# Patient Record
Sex: Female | Born: 1960 | Race: Black or African American | Hispanic: No | Marital: Single | Smoking: Never smoker
Health system: Southern US, Community
[De-identification: ages and names within clinical notes are randomized; demographics above are authoritative.]

## PROBLEM LIST (undated history)

## (undated) DIAGNOSIS — M109 Gout, unspecified: Secondary | ICD-10-CM

## (undated) DIAGNOSIS — M1990 Unspecified osteoarthritis, unspecified site: Secondary | ICD-10-CM

## (undated) DIAGNOSIS — F29 Unspecified psychosis not due to a substance or known physiological condition: Secondary | ICD-10-CM

## (undated) DIAGNOSIS — J309 Allergic rhinitis, unspecified: Secondary | ICD-10-CM

## (undated) DIAGNOSIS — F603 Borderline personality disorder: Secondary | ICD-10-CM

## (undated) DIAGNOSIS — N764 Abscess of vulva: Secondary | ICD-10-CM

## (undated) DIAGNOSIS — G4730 Sleep apnea, unspecified: Secondary | ICD-10-CM

## (undated) DIAGNOSIS — F79 Unspecified intellectual disabilities: Secondary | ICD-10-CM

## (undated) DIAGNOSIS — I10 Essential (primary) hypertension: Secondary | ICD-10-CM

## (undated) DIAGNOSIS — I509 Heart failure, unspecified: Secondary | ICD-10-CM

## (undated) DIAGNOSIS — Z86010 Personal history of colonic polyps: Secondary | ICD-10-CM

## (undated) DIAGNOSIS — E669 Obesity, unspecified: Secondary | ICD-10-CM

## (undated) DIAGNOSIS — N183 Chronic kidney disease, stage 3 unspecified: Secondary | ICD-10-CM

## (undated) DIAGNOSIS — M25559 Pain in unspecified hip: Secondary | ICD-10-CM

## (undated) DIAGNOSIS — L89899 Pressure ulcer of other site, unspecified stage: Secondary | ICD-10-CM

## (undated) DIAGNOSIS — R011 Cardiac murmur, unspecified: Secondary | ICD-10-CM

## (undated) HISTORY — DX: Essential (primary) hypertension: I10

## (undated) HISTORY — DX: Unspecified osteoarthritis, unspecified site: M19.90

## (undated) HISTORY — DX: Allergic rhinitis, unspecified: J30.9

## (undated) HISTORY — DX: Borderline personality disorder: F60.3

## (undated) HISTORY — DX: Personal history of colonic polyps: Z86.010

## (undated) HISTORY — DX: Obesity, unspecified: E66.9

## (undated) HISTORY — DX: Pressure ulcer of other site, unspecified stage: L89.899

## (undated) HISTORY — DX: Pain in unspecified hip: M25.559

## (undated) HISTORY — DX: Cardiac murmur, unspecified: R01.1

## (undated) HISTORY — DX: Gout, unspecified: M10.9

## (undated) HISTORY — DX: Sleep apnea, unspecified: G47.30

## (undated) HISTORY — DX: Unspecified psychosis not due to a substance or known physiological condition: F29

## (undated) HISTORY — DX: Heart failure, unspecified: I50.9

## (undated) HISTORY — DX: Abscess of vulva: N76.4

## (undated) HISTORY — PX: TYMPANOSTOMY TUBE PLACEMENT: SHX32

---

## 1997-08-24 ENCOUNTER — Emergency Department (HOSPITAL_COMMUNITY): Admission: EM | Admit: 1997-08-24 | Discharge: 1997-08-24 | Payer: Self-pay | Admitting: Emergency Medicine

## 1997-08-26 ENCOUNTER — Encounter: Admission: RE | Admit: 1997-08-26 | Discharge: 1997-11-24 | Payer: Self-pay | Admitting: Internal Medicine

## 1997-08-31 ENCOUNTER — Emergency Department (HOSPITAL_COMMUNITY): Admission: EM | Admit: 1997-08-31 | Discharge: 1997-08-31 | Payer: Self-pay | Admitting: Emergency Medicine

## 1997-09-17 ENCOUNTER — Inpatient Hospital Stay: Admission: EM | Admit: 1997-09-17 | Discharge: 1997-09-20 | Payer: Self-pay | Admitting: Emergency Medicine

## 1997-09-20 ENCOUNTER — Inpatient Hospital Stay (HOSPITAL_COMMUNITY): Admission: RE | Admit: 1997-09-20 | Discharge: 1997-09-26 | Payer: Self-pay | Admitting: Specialist

## 1997-10-13 ENCOUNTER — Emergency Department (HOSPITAL_COMMUNITY): Admission: EM | Admit: 1997-10-13 | Discharge: 1997-10-13 | Payer: Self-pay | Admitting: Emergency Medicine

## 1997-11-18 ENCOUNTER — Emergency Department (HOSPITAL_COMMUNITY): Admission: EM | Admit: 1997-11-18 | Discharge: 1997-11-18 | Payer: Self-pay | Admitting: Emergency Medicine

## 1997-11-23 ENCOUNTER — Emergency Department (HOSPITAL_COMMUNITY): Admission: EM | Admit: 1997-11-23 | Discharge: 1997-11-23 | Payer: Self-pay | Admitting: Emergency Medicine

## 1997-12-14 ENCOUNTER — Encounter: Admission: RE | Admit: 1997-12-14 | Discharge: 1998-03-14 | Payer: Self-pay | Admitting: Internal Medicine

## 1998-03-08 ENCOUNTER — Encounter: Payer: Self-pay | Admitting: Emergency Medicine

## 1998-03-08 ENCOUNTER — Emergency Department (HOSPITAL_COMMUNITY): Admission: EM | Admit: 1998-03-08 | Discharge: 1998-03-08 | Payer: Self-pay | Admitting: Emergency Medicine

## 1998-03-09 ENCOUNTER — Ambulatory Visit (HOSPITAL_COMMUNITY): Admission: RE | Admit: 1998-03-09 | Discharge: 1998-03-09 | Payer: Self-pay | Admitting: Gastroenterology

## 1998-03-09 ENCOUNTER — Encounter: Payer: Self-pay | Admitting: Gastroenterology

## 1998-04-14 ENCOUNTER — Ambulatory Visit (HOSPITAL_COMMUNITY): Admission: RE | Admit: 1998-04-14 | Discharge: 1998-04-14 | Payer: Self-pay | Admitting: Gastroenterology

## 1998-05-10 ENCOUNTER — Emergency Department (HOSPITAL_COMMUNITY): Admission: EM | Admit: 1998-05-10 | Discharge: 1998-05-10 | Payer: Self-pay | Admitting: Emergency Medicine

## 1998-06-28 ENCOUNTER — Encounter: Admission: RE | Admit: 1998-06-28 | Discharge: 1998-09-26 | Payer: Self-pay | Admitting: Internal Medicine

## 1998-07-14 ENCOUNTER — Ambulatory Visit (HOSPITAL_COMMUNITY): Admission: RE | Admit: 1998-07-14 | Discharge: 1998-07-14 | Payer: Self-pay | Admitting: Family Medicine

## 1998-07-14 ENCOUNTER — Encounter: Payer: Self-pay | Admitting: Family Medicine

## 1998-08-29 ENCOUNTER — Emergency Department (HOSPITAL_COMMUNITY): Admission: EM | Admit: 1998-08-29 | Discharge: 1998-08-29 | Payer: Self-pay | Admitting: Emergency Medicine

## 1998-09-24 ENCOUNTER — Encounter: Payer: Self-pay | Admitting: Emergency Medicine

## 1998-09-24 ENCOUNTER — Emergency Department (HOSPITAL_COMMUNITY): Admission: EM | Admit: 1998-09-24 | Discharge: 1998-09-24 | Payer: Self-pay | Admitting: Emergency Medicine

## 1998-10-26 ENCOUNTER — Encounter: Admission: RE | Admit: 1998-10-26 | Discharge: 1998-12-13 | Payer: Self-pay | Admitting: Internal Medicine

## 1998-11-28 ENCOUNTER — Emergency Department (HOSPITAL_COMMUNITY): Admission: EM | Admit: 1998-11-28 | Discharge: 1998-11-28 | Payer: Self-pay | Admitting: Emergency Medicine

## 1998-12-14 ENCOUNTER — Encounter: Admission: RE | Admit: 1998-12-14 | Discharge: 1999-02-17 | Payer: Self-pay | Admitting: Internal Medicine

## 1999-03-16 ENCOUNTER — Encounter: Admission: RE | Admit: 1999-03-16 | Discharge: 1999-06-14 | Payer: Self-pay | Admitting: Internal Medicine

## 1999-04-23 ENCOUNTER — Emergency Department (HOSPITAL_COMMUNITY): Admission: EM | Admit: 1999-04-23 | Discharge: 1999-04-23 | Payer: Self-pay | Admitting: Emergency Medicine

## 1999-05-03 ENCOUNTER — Inpatient Hospital Stay (HOSPITAL_COMMUNITY): Admission: EM | Admit: 1999-05-03 | Discharge: 1999-05-05 | Payer: Self-pay | Admitting: *Deleted

## 1999-05-27 ENCOUNTER — Ambulatory Visit (HOSPITAL_COMMUNITY): Admission: RE | Admit: 1999-05-27 | Discharge: 1999-05-27 | Payer: Self-pay | Admitting: Family Medicine

## 1999-06-15 ENCOUNTER — Encounter: Admission: RE | Admit: 1999-06-15 | Discharge: 1999-09-13 | Payer: Self-pay | Admitting: Internal Medicine

## 1999-07-26 ENCOUNTER — Other Ambulatory Visit: Admission: RE | Admit: 1999-07-26 | Discharge: 1999-07-26 | Payer: Self-pay | Admitting: Family Medicine

## 1999-08-25 ENCOUNTER — Emergency Department (HOSPITAL_COMMUNITY): Admission: EM | Admit: 1999-08-25 | Discharge: 1999-08-25 | Payer: Self-pay | Admitting: Emergency Medicine

## 1999-09-20 ENCOUNTER — Encounter: Admission: RE | Admit: 1999-09-20 | Discharge: 1999-12-19 | Payer: Self-pay | Admitting: Internal Medicine

## 1999-10-09 ENCOUNTER — Encounter: Payer: Self-pay | Admitting: Emergency Medicine

## 1999-10-09 ENCOUNTER — Emergency Department (HOSPITAL_COMMUNITY): Admission: EM | Admit: 1999-10-09 | Discharge: 1999-10-10 | Payer: Self-pay | Admitting: Emergency Medicine

## 1999-12-25 ENCOUNTER — Emergency Department (HOSPITAL_COMMUNITY): Admission: EM | Admit: 1999-12-25 | Discharge: 1999-12-25 | Payer: Self-pay | Admitting: Emergency Medicine

## 1999-12-25 ENCOUNTER — Encounter: Payer: Self-pay | Admitting: Emergency Medicine

## 2000-02-19 ENCOUNTER — Observation Stay (HOSPITAL_COMMUNITY): Admission: EM | Admit: 2000-02-19 | Discharge: 2000-02-20 | Payer: Self-pay | Admitting: Emergency Medicine

## 2000-04-24 ENCOUNTER — Encounter: Admission: RE | Admit: 2000-04-24 | Discharge: 2000-04-24 | Payer: Self-pay | Admitting: Family Medicine

## 2000-05-22 ENCOUNTER — Emergency Department (HOSPITAL_COMMUNITY): Admission: EM | Admit: 2000-05-22 | Discharge: 2000-05-22 | Payer: Self-pay | Admitting: Emergency Medicine

## 2000-05-22 ENCOUNTER — Encounter: Payer: Self-pay | Admitting: Emergency Medicine

## 2000-05-24 ENCOUNTER — Encounter: Admission: RE | Admit: 2000-05-24 | Discharge: 2000-05-24 | Payer: Self-pay | Admitting: Sports Medicine

## 2000-05-24 ENCOUNTER — Encounter: Admission: RE | Admit: 2000-05-24 | Discharge: 2000-05-24 | Payer: Self-pay | Admitting: Family Medicine

## 2000-05-24 ENCOUNTER — Encounter: Payer: Self-pay | Admitting: Sports Medicine

## 2000-06-01 ENCOUNTER — Encounter: Admission: RE | Admit: 2000-06-01 | Discharge: 2000-06-01 | Payer: Self-pay | Admitting: *Deleted

## 2000-06-01 ENCOUNTER — Encounter: Admission: RE | Admit: 2000-06-01 | Discharge: 2000-06-01 | Payer: Self-pay | Admitting: Family Medicine

## 2000-06-12 ENCOUNTER — Encounter: Admission: RE | Admit: 2000-06-12 | Discharge: 2000-09-10 | Payer: Self-pay | Admitting: *Deleted

## 2000-06-14 ENCOUNTER — Encounter: Admission: RE | Admit: 2000-06-14 | Discharge: 2000-06-14 | Payer: Self-pay | Admitting: Family Medicine

## 2000-06-14 ENCOUNTER — Encounter: Admission: RE | Admit: 2000-06-14 | Discharge: 2000-06-14 | Payer: Self-pay | Admitting: *Deleted

## 2000-06-18 ENCOUNTER — Inpatient Hospital Stay (HOSPITAL_COMMUNITY): Admission: EM | Admit: 2000-06-18 | Discharge: 2000-06-26 | Payer: Self-pay | Admitting: *Deleted

## 2000-08-03 ENCOUNTER — Encounter: Admission: RE | Admit: 2000-08-03 | Discharge: 2000-08-03 | Payer: Self-pay | Admitting: Family Medicine

## 2000-08-27 ENCOUNTER — Encounter: Admission: RE | Admit: 2000-08-27 | Discharge: 2000-08-27 | Payer: Self-pay | Admitting: Family Medicine

## 2000-09-04 ENCOUNTER — Encounter: Admission: RE | Admit: 2000-09-04 | Discharge: 2000-09-04 | Payer: Self-pay | Admitting: Obstetrics & Gynecology

## 2000-09-05 ENCOUNTER — Inpatient Hospital Stay (HOSPITAL_COMMUNITY): Admission: EM | Admit: 2000-09-05 | Discharge: 2000-09-06 | Payer: Self-pay | Admitting: Emergency Medicine

## 2000-09-11 ENCOUNTER — Encounter: Admission: RE | Admit: 2000-09-11 | Discharge: 2000-09-11 | Payer: Self-pay | Admitting: Obstetrics & Gynecology

## 2000-09-11 ENCOUNTER — Other Ambulatory Visit: Admission: RE | Admit: 2000-09-11 | Discharge: 2000-09-11 | Payer: Self-pay | Admitting: Obstetrics & Gynecology

## 2000-09-12 ENCOUNTER — Encounter: Admission: RE | Admit: 2000-09-12 | Discharge: 2000-09-12 | Payer: Self-pay | Admitting: Family Medicine

## 2000-09-18 ENCOUNTER — Encounter: Admission: RE | Admit: 2000-09-18 | Discharge: 2000-09-18 | Payer: Self-pay | Admitting: Family Medicine

## 2000-09-21 ENCOUNTER — Encounter: Admission: RE | Admit: 2000-09-21 | Discharge: 2000-09-21 | Payer: Self-pay | Admitting: Family Medicine

## 2000-10-08 ENCOUNTER — Encounter: Admission: RE | Admit: 2000-10-08 | Discharge: 2000-10-08 | Payer: Self-pay | Admitting: Family Medicine

## 2000-11-28 ENCOUNTER — Ambulatory Visit (HOSPITAL_COMMUNITY): Admission: RE | Admit: 2000-11-28 | Discharge: 2000-11-28 | Payer: Self-pay | Admitting: Obstetrics & Gynecology

## 2000-12-21 ENCOUNTER — Emergency Department (HOSPITAL_COMMUNITY): Admission: EM | Admit: 2000-12-21 | Discharge: 2000-12-22 | Payer: Self-pay | Admitting: *Deleted

## 2000-12-26 ENCOUNTER — Encounter: Admission: RE | Admit: 2000-12-26 | Discharge: 2001-03-26 | Payer: Self-pay | Admitting: Sports Medicine

## 2001-02-12 ENCOUNTER — Encounter: Admission: RE | Admit: 2001-02-12 | Discharge: 2001-02-12 | Payer: Self-pay | Admitting: Family Medicine

## 2001-04-01 ENCOUNTER — Encounter: Admission: RE | Admit: 2001-04-01 | Discharge: 2001-04-01 | Payer: Self-pay | Admitting: Family Medicine

## 2001-05-02 ENCOUNTER — Encounter: Admission: RE | Admit: 2001-05-02 | Discharge: 2001-07-31 | Payer: Self-pay | Admitting: Sports Medicine

## 2001-05-23 ENCOUNTER — Encounter: Admission: RE | Admit: 2001-05-23 | Discharge: 2001-05-23 | Payer: Self-pay | Admitting: Family Medicine

## 2001-06-03 ENCOUNTER — Emergency Department (HOSPITAL_COMMUNITY): Admission: EM | Admit: 2001-06-03 | Discharge: 2001-06-03 | Payer: Self-pay | Admitting: *Deleted

## 2001-06-25 ENCOUNTER — Encounter: Admission: RE | Admit: 2001-06-25 | Discharge: 2001-06-25 | Payer: Self-pay | Admitting: Family Medicine

## 2001-07-04 ENCOUNTER — Encounter: Payer: Self-pay | Admitting: *Deleted

## 2001-07-04 ENCOUNTER — Encounter: Admission: RE | Admit: 2001-07-04 | Discharge: 2001-07-04 | Payer: Self-pay | Admitting: *Deleted

## 2001-09-30 ENCOUNTER — Inpatient Hospital Stay (HOSPITAL_COMMUNITY): Admission: EM | Admit: 2001-09-30 | Discharge: 2001-10-02 | Payer: Self-pay | Admitting: Emergency Medicine

## 2001-10-02 ENCOUNTER — Inpatient Hospital Stay (HOSPITAL_COMMUNITY): Admission: EM | Admit: 2001-10-02 | Discharge: 2001-10-08 | Payer: Self-pay | Admitting: Psychiatry

## 2001-10-22 ENCOUNTER — Encounter: Admission: RE | Admit: 2001-10-22 | Discharge: 2001-10-22 | Payer: Self-pay | Admitting: Family Medicine

## 2001-10-31 ENCOUNTER — Encounter: Admission: RE | Admit: 2001-10-31 | Discharge: 2001-10-31 | Payer: Self-pay | Admitting: Family Medicine

## 2001-11-06 ENCOUNTER — Encounter: Admission: RE | Admit: 2001-11-06 | Discharge: 2001-11-06 | Payer: Self-pay | Admitting: Family Medicine

## 2001-11-12 ENCOUNTER — Encounter: Admission: RE | Admit: 2001-11-12 | Discharge: 2001-11-12 | Payer: Self-pay | Admitting: Family Medicine

## 2001-11-21 ENCOUNTER — Encounter: Admission: RE | Admit: 2001-11-21 | Discharge: 2001-11-21 | Payer: Self-pay | Admitting: Family Medicine

## 2001-12-30 ENCOUNTER — Encounter: Admission: RE | Admit: 2001-12-30 | Discharge: 2001-12-30 | Payer: Self-pay | Admitting: Family Medicine

## 2002-01-17 ENCOUNTER — Encounter: Admission: RE | Admit: 2002-01-17 | Discharge: 2002-01-17 | Payer: Self-pay | Admitting: Family Medicine

## 2002-01-24 ENCOUNTER — Encounter: Payer: Self-pay | Admitting: Emergency Medicine

## 2002-01-24 ENCOUNTER — Emergency Department (HOSPITAL_COMMUNITY): Admission: EM | Admit: 2002-01-24 | Discharge: 2002-01-24 | Payer: Self-pay | Admitting: Emergency Medicine

## 2002-02-07 ENCOUNTER — Encounter: Admission: RE | Admit: 2002-02-07 | Discharge: 2002-02-07 | Payer: Self-pay | Admitting: Family Medicine

## 2002-02-12 ENCOUNTER — Encounter: Admission: RE | Admit: 2002-02-12 | Discharge: 2002-02-12 | Payer: Self-pay | Admitting: Family Medicine

## 2002-02-26 ENCOUNTER — Encounter: Admission: RE | Admit: 2002-02-26 | Discharge: 2002-02-26 | Payer: Self-pay | Admitting: Family Medicine

## 2002-03-21 ENCOUNTER — Emergency Department (HOSPITAL_COMMUNITY): Admission: EM | Admit: 2002-03-21 | Discharge: 2002-03-21 | Payer: Self-pay | Admitting: Emergency Medicine

## 2002-03-21 ENCOUNTER — Encounter: Payer: Self-pay | Admitting: Emergency Medicine

## 2002-06-28 ENCOUNTER — Emergency Department (HOSPITAL_COMMUNITY): Admission: EM | Admit: 2002-06-28 | Discharge: 2002-06-28 | Payer: Self-pay

## 2002-08-24 ENCOUNTER — Emergency Department (HOSPITAL_COMMUNITY): Admission: EM | Admit: 2002-08-24 | Discharge: 2002-08-24 | Payer: Self-pay | Admitting: *Deleted

## 2002-09-01 ENCOUNTER — Encounter: Admission: RE | Admit: 2002-09-01 | Discharge: 2002-09-01 | Payer: Self-pay | Admitting: Family Medicine

## 2003-03-03 ENCOUNTER — Encounter: Admission: RE | Admit: 2003-03-03 | Discharge: 2003-03-03 | Payer: Self-pay | Admitting: Sports Medicine

## 2003-03-18 ENCOUNTER — Encounter: Admission: RE | Admit: 2003-03-18 | Discharge: 2003-03-18 | Payer: Self-pay | Admitting: Family Medicine

## 2003-03-20 ENCOUNTER — Encounter: Admission: RE | Admit: 2003-03-20 | Discharge: 2003-03-20 | Payer: Self-pay | Admitting: Sports Medicine

## 2003-04-15 ENCOUNTER — Encounter (INDEPENDENT_AMBULATORY_CARE_PROVIDER_SITE_OTHER): Payer: Self-pay | Admitting: *Deleted

## 2003-04-15 LAB — CONVERTED CEMR LAB

## 2003-04-20 ENCOUNTER — Encounter: Admission: RE | Admit: 2003-04-20 | Discharge: 2003-04-20 | Payer: Self-pay | Admitting: Family Medicine

## 2003-04-20 ENCOUNTER — Encounter: Admission: RE | Admit: 2003-04-20 | Discharge: 2003-04-20 | Payer: Self-pay | Admitting: Sports Medicine

## 2003-04-27 ENCOUNTER — Other Ambulatory Visit: Admission: RE | Admit: 2003-04-27 | Discharge: 2003-04-27 | Payer: Self-pay | Admitting: Family Medicine

## 2003-04-27 ENCOUNTER — Encounter: Admission: RE | Admit: 2003-04-27 | Discharge: 2003-04-27 | Payer: Self-pay | Admitting: Family Medicine

## 2003-05-05 ENCOUNTER — Encounter: Admission: RE | Admit: 2003-05-05 | Discharge: 2003-05-05 | Payer: Self-pay | Admitting: Family Medicine

## 2003-05-29 ENCOUNTER — Encounter: Admission: RE | Admit: 2003-05-29 | Discharge: 2003-05-29 | Payer: Self-pay | Admitting: Family Medicine

## 2003-06-10 ENCOUNTER — Encounter: Admission: RE | Admit: 2003-06-10 | Discharge: 2003-06-10 | Payer: Self-pay | Admitting: Family Medicine

## 2003-07-07 ENCOUNTER — Encounter: Admission: RE | Admit: 2003-07-07 | Discharge: 2003-07-07 | Payer: Self-pay | Admitting: Family Medicine

## 2003-08-17 ENCOUNTER — Inpatient Hospital Stay (HOSPITAL_COMMUNITY): Admission: AD | Admit: 2003-08-17 | Discharge: 2003-08-19 | Payer: Self-pay | Admitting: Pediatrics

## 2003-08-17 ENCOUNTER — Emergency Department (HOSPITAL_COMMUNITY): Admission: EM | Admit: 2003-08-17 | Discharge: 2003-08-17 | Payer: Self-pay | Admitting: Emergency Medicine

## 2003-08-25 ENCOUNTER — Encounter: Admission: RE | Admit: 2003-08-25 | Discharge: 2003-08-25 | Payer: Self-pay | Admitting: Sports Medicine

## 2003-10-05 ENCOUNTER — Emergency Department (HOSPITAL_COMMUNITY): Admission: EM | Admit: 2003-10-05 | Discharge: 2003-10-06 | Payer: Self-pay | Admitting: Emergency Medicine

## 2003-10-06 ENCOUNTER — Emergency Department (HOSPITAL_COMMUNITY): Admission: EM | Admit: 2003-10-06 | Discharge: 2003-10-06 | Payer: Self-pay

## 2003-12-01 ENCOUNTER — Encounter: Admission: RE | Admit: 2003-12-01 | Discharge: 2003-12-01 | Payer: Self-pay | Admitting: Obstetrics & Gynecology

## 2004-06-15 ENCOUNTER — Encounter: Admission: RE | Admit: 2004-06-15 | Discharge: 2004-06-15 | Payer: Self-pay | Admitting: Family Medicine

## 2004-07-18 ENCOUNTER — Encounter: Admission: RE | Admit: 2004-07-18 | Discharge: 2004-10-16 | Payer: Self-pay | Admitting: Family Medicine

## 2004-07-25 ENCOUNTER — Emergency Department (HOSPITAL_COMMUNITY): Admission: EM | Admit: 2004-07-25 | Discharge: 2004-07-25 | Payer: Self-pay | Admitting: Emergency Medicine

## 2004-08-31 ENCOUNTER — Emergency Department (HOSPITAL_COMMUNITY): Admission: EM | Admit: 2004-08-31 | Discharge: 2004-09-01 | Payer: Self-pay | Admitting: Emergency Medicine

## 2004-10-18 ENCOUNTER — Emergency Department (HOSPITAL_COMMUNITY): Admission: EM | Admit: 2004-10-18 | Discharge: 2004-10-18 | Payer: Self-pay | Admitting: Emergency Medicine

## 2005-09-07 ENCOUNTER — Encounter: Admission: RE | Admit: 2005-09-07 | Discharge: 2005-09-07 | Payer: Self-pay | Admitting: Family Medicine

## 2005-12-03 ENCOUNTER — Emergency Department (HOSPITAL_COMMUNITY): Admission: EM | Admit: 2005-12-03 | Discharge: 2005-12-03 | Payer: Self-pay | Admitting: Emergency Medicine

## 2006-01-16 ENCOUNTER — Other Ambulatory Visit: Admission: RE | Admit: 2006-01-16 | Discharge: 2006-01-16 | Payer: Self-pay | Admitting: Family Medicine

## 2006-07-13 ENCOUNTER — Encounter (INDEPENDENT_AMBULATORY_CARE_PROVIDER_SITE_OTHER): Payer: Self-pay | Admitting: *Deleted

## 2006-10-12 ENCOUNTER — Encounter: Admission: RE | Admit: 2006-10-12 | Discharge: 2006-10-12 | Payer: Self-pay | Admitting: Family Medicine

## 2006-11-08 ENCOUNTER — Ambulatory Visit: Payer: Self-pay | Admitting: Pulmonary Disease

## 2006-11-08 LAB — CONVERTED CEMR LAB: TSH: 2.49 microintl units/mL (ref 0.35–5.50)

## 2006-12-16 ENCOUNTER — Ambulatory Visit (HOSPITAL_BASED_OUTPATIENT_CLINIC_OR_DEPARTMENT_OTHER): Admission: RE | Admit: 2006-12-16 | Discharge: 2006-12-16 | Payer: Self-pay | Admitting: Pulmonary Disease

## 2007-01-07 ENCOUNTER — Ambulatory Visit: Payer: Self-pay | Admitting: Pulmonary Disease

## 2007-01-16 ENCOUNTER — Ambulatory Visit: Payer: Self-pay | Admitting: Pulmonary Disease

## 2007-02-14 ENCOUNTER — Ambulatory Visit: Payer: Self-pay | Admitting: Pulmonary Disease

## 2007-03-25 ENCOUNTER — Encounter: Admission: RE | Admit: 2007-03-25 | Discharge: 2007-03-25 | Payer: Self-pay | Admitting: Family Medicine

## 2007-05-20 ENCOUNTER — Encounter: Admission: RE | Admit: 2007-05-20 | Discharge: 2007-05-20 | Payer: Self-pay | Admitting: Family Medicine

## 2007-11-29 ENCOUNTER — Encounter: Admission: RE | Admit: 2007-11-29 | Discharge: 2007-11-29 | Payer: Self-pay | Admitting: Family Medicine

## 2008-06-10 ENCOUNTER — Encounter: Admission: RE | Admit: 2008-06-10 | Discharge: 2008-06-10 | Payer: Self-pay | Admitting: Family Medicine

## 2008-07-10 ENCOUNTER — Encounter: Admission: RE | Admit: 2008-07-10 | Discharge: 2008-07-10 | Payer: Self-pay | Admitting: Orthopedic Surgery

## 2008-07-16 ENCOUNTER — Encounter: Admission: RE | Admit: 2008-07-16 | Discharge: 2008-07-16 | Payer: Self-pay | Admitting: Orthopedic Surgery

## 2008-12-09 ENCOUNTER — Encounter: Admission: RE | Admit: 2008-12-09 | Discharge: 2008-12-09 | Payer: Self-pay | Admitting: Family Medicine

## 2009-02-03 ENCOUNTER — Other Ambulatory Visit: Admission: RE | Admit: 2009-02-03 | Discharge: 2009-02-03 | Payer: Self-pay | Admitting: Family Medicine

## 2009-05-15 HISTORY — PX: OTHER SURGICAL HISTORY: SHX169

## 2009-08-02 ENCOUNTER — Ambulatory Visit: Payer: Self-pay | Admitting: Family Medicine

## 2009-08-02 ENCOUNTER — Encounter: Payer: Self-pay | Admitting: Family Medicine

## 2009-08-02 DIAGNOSIS — G479 Sleep disorder, unspecified: Secondary | ICD-10-CM | POA: Insufficient documentation

## 2009-08-02 DIAGNOSIS — M159 Polyosteoarthritis, unspecified: Secondary | ICD-10-CM | POA: Insufficient documentation

## 2009-08-02 DIAGNOSIS — J309 Allergic rhinitis, unspecified: Secondary | ICD-10-CM

## 2009-08-02 DIAGNOSIS — IMO0002 Reserved for concepts with insufficient information to code with codable children: Secondary | ICD-10-CM | POA: Insufficient documentation

## 2009-08-02 DIAGNOSIS — I10 Essential (primary) hypertension: Secondary | ICD-10-CM | POA: Insufficient documentation

## 2009-08-02 DIAGNOSIS — F79 Unspecified intellectual disabilities: Secondary | ICD-10-CM

## 2009-08-02 DIAGNOSIS — K219 Gastro-esophageal reflux disease without esophagitis: Secondary | ICD-10-CM | POA: Insufficient documentation

## 2009-08-02 DIAGNOSIS — Z6841 Body Mass Index (BMI) 40.0 and over, adult: Secondary | ICD-10-CM | POA: Insufficient documentation

## 2009-08-02 HISTORY — DX: Allergic rhinitis, unspecified: J30.9

## 2009-08-16 ENCOUNTER — Ambulatory Visit (HOSPITAL_BASED_OUTPATIENT_CLINIC_OR_DEPARTMENT_OTHER): Admission: RE | Admit: 2009-08-16 | Discharge: 2009-08-16 | Payer: Self-pay | Admitting: Specialist

## 2009-09-01 ENCOUNTER — Encounter: Payer: Self-pay | Admitting: Family Medicine

## 2009-09-01 DIAGNOSIS — R252 Cramp and spasm: Secondary | ICD-10-CM | POA: Insufficient documentation

## 2009-09-03 ENCOUNTER — Encounter: Payer: Self-pay | Admitting: Family Medicine

## 2009-09-03 LAB — CONVERTED CEMR LAB
ALT: <8 units/L (ref 0–35)
AST: 15 units/L (ref 0–37)
Albumin: 4 g/dL (ref 3.5–5.2)
Alkaline Phosphatase: 144 units/L — ABNORMAL HIGH (ref 39–117)
BUN: 19 mg/dL (ref 6–23)
Basophils Absolute: 0 10*3/uL (ref 0.0–0.1)
Basophils Relative: 0 % (ref 0–1)
CO2: 24 meq/L (ref 19–32)
Calcium: 9.3 mg/dL (ref 8.4–10.5)
Chloride: 103 meq/L (ref 96–112)
Cholesterol: 116 mg/dL (ref 0–200)
Creatinine, Ser: 1.16 mg/dL (ref 0.40–1.20)
Eosinophils Absolute: 0.3 10*3/uL (ref 0.0–0.7)
Eosinophils Relative: 4 % (ref 0–5)
Glucose, Bld: 79 mg/dL (ref 70–99)
HCT: 35.8 % — ABNORMAL LOW (ref 36.0–46.0)
HDL: 48 mg/dL (ref >39)
Hemoglobin: 11 g/dL — ABNORMAL LOW (ref 12.0–15.0)
LDL Cholesterol: 56 mg/dL (ref 0–99)
Lymphocytes Relative: 34 % (ref 12–46)
Lymphs Abs: 2.8 10*3/uL (ref 0.7–4.0)
MCHC: 30.7 g/dL (ref 30.0–36.0)
MCV: 81.4 fL (ref 78.0–100.0)
Monocytes Absolute: 0.6 10*3/uL (ref 0.1–1.0)
Monocytes Relative: 7 % (ref 3–12)
Neutro Abs: 4.5 10*3/uL (ref 1.7–7.7)
Neutrophils Relative %: 54 % (ref 43–77)
Platelets: 352 10*3/uL (ref 150–400)
Potassium: 4.1 meq/L (ref 3.5–5.3)
RBC: 4.4 M/uL (ref 3.87–5.11)
RDW: 15.3 % (ref 11.5–15.5)
Sodium: 139 meq/L (ref 135–145)
TSH: 2.646 microintl units/mL (ref 0.350–4.500)
Total Bilirubin: 0.5 mg/dL (ref 0.3–1.2)
Total CHOL/HDL Ratio: 2.4
Total Protein: 7.3 g/dL (ref 6.0–8.3)
Triglycerides: 58 mg/dL (ref <150)
VLDL: 12 mg/dL (ref 0–40)
WBC: 8.4 10*3/uL (ref 4.0–10.5)

## 2009-09-23 ENCOUNTER — Encounter: Payer: Self-pay | Admitting: Family Medicine

## 2009-10-01 ENCOUNTER — Ambulatory Visit: Payer: Self-pay | Admitting: Family Medicine

## 2009-10-01 DIAGNOSIS — L89899 Pressure ulcer of other site, unspecified stage: Secondary | ICD-10-CM | POA: Insufficient documentation

## 2009-10-01 HISTORY — DX: Pressure ulcer of other site, unspecified stage: L89.899

## 2009-10-07 ENCOUNTER — Encounter: Admission: RE | Admit: 2009-10-07 | Discharge: 2009-10-07 | Payer: Self-pay | Admitting: Orthopedic Surgery

## 2009-10-08 ENCOUNTER — Ambulatory Visit: Payer: Self-pay | Admitting: Family Medicine

## 2009-10-12 ENCOUNTER — Ambulatory Visit: Payer: Self-pay | Admitting: Family Medicine

## 2009-10-15 ENCOUNTER — Encounter: Payer: Self-pay | Admitting: Family Medicine

## 2009-10-15 LAB — CONVERTED CEMR LAB
Pap Smear: NEGATIVE
Pap Smear: NORMAL

## 2009-10-19 ENCOUNTER — Ambulatory Visit: Payer: Self-pay | Admitting: Family Medicine

## 2009-10-22 ENCOUNTER — Ambulatory Visit: Payer: Self-pay | Admitting: Family Medicine

## 2009-10-29 ENCOUNTER — Ambulatory Visit: Payer: Self-pay | Admitting: Family Medicine

## 2009-11-05 ENCOUNTER — Ambulatory Visit: Payer: Self-pay | Admitting: Family Medicine

## 2009-11-12 ENCOUNTER — Ambulatory Visit: Payer: Self-pay | Admitting: Family Medicine

## 2009-11-18 ENCOUNTER — Ambulatory Visit: Payer: Self-pay | Admitting: Family Medicine

## 2009-11-29 ENCOUNTER — Ambulatory Visit: Payer: Self-pay | Admitting: Family Medicine

## 2009-12-06 ENCOUNTER — Ambulatory Visit: Payer: Self-pay | Admitting: Family Medicine

## 2009-12-13 ENCOUNTER — Ambulatory Visit: Payer: Self-pay | Admitting: Family Medicine

## 2009-12-20 ENCOUNTER — Ambulatory Visit: Payer: Self-pay | Admitting: Family Medicine

## 2009-12-20 DIAGNOSIS — F29 Unspecified psychosis not due to a substance or known physiological condition: Secondary | ICD-10-CM | POA: Insufficient documentation

## 2009-12-20 HISTORY — DX: Unspecified psychosis not due to a substance or known physiological condition: F29

## 2009-12-23 ENCOUNTER — Ambulatory Visit: Payer: Self-pay | Admitting: Diagnostic Radiology

## 2009-12-23 ENCOUNTER — Emergency Department (HOSPITAL_BASED_OUTPATIENT_CLINIC_OR_DEPARTMENT_OTHER): Admission: EM | Admit: 2009-12-23 | Discharge: 2009-12-23 | Payer: Self-pay | Admitting: Emergency Medicine

## 2009-12-27 ENCOUNTER — Telehealth: Payer: Self-pay | Admitting: Family Medicine

## 2010-01-14 ENCOUNTER — Ambulatory Visit: Payer: Self-pay | Admitting: Family Medicine

## 2010-01-18 ENCOUNTER — Encounter (HOSPITAL_BASED_OUTPATIENT_CLINIC_OR_DEPARTMENT_OTHER)
Admission: RE | Admit: 2010-01-18 | Discharge: 2010-04-18 | Payer: Self-pay | Source: Home / Self Care | Admitting: General Surgery

## 2010-01-24 ENCOUNTER — Encounter: Payer: Self-pay | Admitting: Family Medicine

## 2010-01-25 ENCOUNTER — Telehealth: Payer: Self-pay | Admitting: Family Medicine

## 2010-01-26 ENCOUNTER — Emergency Department (HOSPITAL_COMMUNITY): Admission: EM | Admit: 2010-01-26 | Discharge: 2010-01-26 | Payer: Self-pay | Admitting: Emergency Medicine

## 2010-01-26 ENCOUNTER — Encounter: Payer: Self-pay | Admitting: *Deleted

## 2010-01-27 ENCOUNTER — Ambulatory Visit: Payer: Self-pay | Admitting: Family Medicine

## 2010-02-16 ENCOUNTER — Encounter: Payer: Self-pay | Admitting: Family Medicine

## 2010-02-17 ENCOUNTER — Ambulatory Visit: Payer: Self-pay | Admitting: Family Medicine

## 2010-04-19 ENCOUNTER — Encounter: Admission: RE | Admit: 2010-04-19 | Discharge: 2010-04-19 | Payer: Self-pay | Admitting: Family Medicine

## 2010-05-02 ENCOUNTER — Ambulatory Visit: Payer: Self-pay

## 2010-06-14 NOTE — Assessment & Plan Note (Signed)
Summary: DRESSING CHANGE/KH  Nurse Visit old Restore dressing removed and wound cleaned with normal saline.  area is now 1.5 X 2 cm. new restore dressing applied . return in one week.  will need follow up with MD one month from 0/11/2009. Theresia Lo RN  November 29, 2009 10:59 AM   Allergies: 1)  ! Vasotec  Orders Added: 1)  Est Level 1- [facility] [[id number]]

## 2010-06-14 NOTE — Assessment & Plan Note (Signed)
Summary: meet new doctor and follow up wound care/ls   Vital Signs:  Patient profile:   50 year old female Height:      64.5 inches Weight:      302.44 pounds BMI:     51.30 BSA:     2.35 Temp:     98.8 degrees F Pulse rate:   92 / minute BP sitting:   126 / 80  Vitals Entered By: Jone Baseman CMA (February 17, 2010 2:50 PM) CC: Meet new MD Is Patient Diabetic? No Pain Assessment Patient in pain? no        Primary Care Provider:  Alvia Grove DO  CC:  Meet new MD.  History of Present Illness: 50 yo female, here for follow up visit of wound and to meet new MD (1st visit with me). 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 Had been followed by wound care but ulcer had shown so much improvement that they were no longer needed.   Pt denies fever, chills, nausea, emesis, diarrhea, constipation, drainage from wound.  Wound is checked daily by staff at adult care facility, has not required a dressing or medicine in > 3 weeks.   HTN: No chest pain, no SOB, no dizziness.  taking meds as prescribed.  Habits & Providers  Alcohol-Tobacco-Diet     Alcohol drinks/day: 0     Tobacco Status: never   Prevention & Chronic Care Immunizations   Influenza vaccine: Fluvax 3+  (02/17/2010)    Tetanus booster: 11/12/2001: Done.    Pneumococcal vaccine: Not documented  Other Screening   Pap smear: Normal  (10/15/2009)   Pap smear due: 10/2012    Mammogram: Assessment: BIRADS 1. (approximate date was july 2010)  (11/12/2008)   Mammogram action/deferral: Deferred-2 yr interval  (02/17/2010)   Smoking status: never  (02/17/2010)  Lipids   Total Cholesterol: 116  (09/01/2009)   LDL: 56  (09/01/2009)   LDL Direct: Not documented   HDL: 48  (09/01/2009)   Triglycerides: 58  (09/01/2009)   Lipid panel due: 09/02/2010  Hypertension   Last Blood Pressure: 126 / 80   (02/17/2010)   Serum creatinine: 1.16  (09/01/2009)   Serum potassium 4.1  (09/01/2009)    Hypertension flowsheet reviewed?: Yes   Progress toward BP goal: At goal  Self-Management Support :   Personal Goals (by the next clinic visit) :      Personal blood pressure goal: 140/90  (08/02/2009)   Hypertension self-management support: Not documented    Hypertension self-management support not done because: Good outcomes  (09/01/2009)  Current Problems (verified): 1)  Unspecified Psychosis  (ICD-298.9) 2)  Screening For Malignant Neoplasm of The Cervix  (ICD-V76.2) 3)  Pressure Ulcer Other Site  (ICD-707.09) 4)  Leg Cramps  (ICD-729.82) 5)  Preventive Health Care  (ICD-V70.0) 6)  Osteoarthritis, Multiple Joints  (ICD-715.89) 7)  Allergic Rhinitis  (ICD-477.9) 8)  Essential Hypertension, Benign  (ICD-401.1) 9)  Sleep Disorder  (ICD-780.50) 10)  Behavior Problem  (ICD-V40.9) 11)  Gerd  (ICD-530.81) 12)  Morbid Obesity  (ICD-278.01) 13)  Mental Retardation  (ICD-319)  Current Medications (verified): 1)  Poly Tan Dm Suspension .Marland Kitchen.. 1 Teaspoon Q12hr As Needed Cough 2)  Triamterene-Hctz 37.5-25 Mg Tabs (Triamterene-Hctz) .... Daily 3)  Aspir-Low 81 Mg Tbec (Aspirin) .... Daily 4)  Fluoxetine Hcl 40 Mg Caps (Fluoxetine Hcl) .... Daily Per [facility] 5)  Colace 100 Mg Caps (Docusate Sodium) .... 2 Tabs Two Times A Day 6)  Wellbutrin Sr 150 Mg Xr12h-Tab (Bupropion Hcl) .... Two Times A Day Per [facility] 7)  Omeprazole 20 Mg Cpdr (Omeprazole) .... Daily For Reflux 8)  Benztropine Mesylate 1 Mg Tabs (Benztropine Mesylate) .... Qhs Per [facility] 9)  Lorazepam 0.5 Mg Tabs (Lorazepam) .... Qid Prn Agitation Per [facility] 10)  Mirtazapine 15 Mg Tabs (Mirtazapine) .... By Mouth At Bedtime 11)  Risperidone 2 Mg Tabs (Risperidone) .... Qhs Per [facility] 12)  Zonisamide 100 Mg Caps (Zonisamide) .... Qhs Per [facility] 13)  Etodolac 400 Mg Tabs (Etodolac) .Marland Kitchen.. 1 Po Bid  Per Dr Montez Morita (Ortho) 14)  Hydrocodone-Acetaminophen 10-650 Mg Tabs (Hydrocodone-Acetaminophen) .... Q8hr Per Dr Montez Morita (Ortho) 15)  Methocarbamol 500 Mg Tabs (Methocarbamol) .... Bid Per Dr Montez Morita (Ortho) 16)  Fluticasone Propionate 0.05 % Crea (Fluticasone Propionate) .... Two Times A Day Prn 17)  Oxycodone-Acetaminophen 5-500 Mg Caps (Oxycodone-Acetaminophen) .... Q3hr As Needed Per Dr Montez Morita 18)  Restore Wound Care Dressing  Pads (Wound Dressings) .... Apply To Wound Once A Week. 19)  Saphris 10 Mg Subl (Asenapine Maleate) .... Per [facility] 20)  Nystop 100000 Unit/gm Powd (Nystatin) .... Apply To Itchy Areas On Lower Abdomen Two Times A Day As Needed. Disp 1 Large Bottle  Allergies (verified): 1)  ! Vasotec  Past History:  Past Medical History: Last updated: 08/02/2009 h/o TB treated with [facility] attempt 4/02--tylenol OD - seen at [facility] OSTEOARTHRITIS, MULTIPLE JOINTS (ICD-715.89) - see by orthopedist Dr Montez Morita ALLERGIC RHINITIS (ICD-477.9) Hx of CHF (ICD-428.0) ESSENTIAL HYPERTENSION, BENIGN (ICD-401.1) SLEEP DISORDER (ICD-780.50) BEHAVIOR PROBLEM (ICD-V40.9) GERD (ICD-530.81) MORBID OBESITY (ICD-278.01) MENTAL RETARDATION (ICD-319)  Past Surgical History: Last updated: 09/01/2009 BTL - 09/01/2002, pe tubes - cyst/lipoma removal left scalp 07/2009  Family History: Last updated: 08/02/2009 DM--mom, sister  htn--mom, bro, sister stroke - sister  Social History: Last updated: 02/17/2010 lives with sister Jonise Weightman [facility]) and brother. Has some serivces through [employer].  no tob/etoh/drugs  Risk Factors: Alcohol Use: 0 (02/17/2010)  Risk Factors: Smoking Status: never (02/17/2010)  Family History: Reviewed history from 08/02/2009 and no changes required. DM--mom, sister  htn--mom, bro, sister stroke - sister  Social History: Reviewed history from 08/02/2009 and no changes required. lives with sister Jazzy Parmer  [facility]) and brother. Has some serivces through [employer].  no tob/etoh/drugs  Review of Systems  The patient denies anorexia, fever, weight loss, weight gain, vision loss, decreased hearing, hoarseness, chest pain, syncope, dyspnea on exertion, peripheral edema, prolonged cough, headaches, hemoptysis, abdominal pain, melena, hematochezia, severe indigestion/heartburn, hematuria, incontinence, genital sores, muscle weakness, suspicious skin lesions, transient blindness, difficulty walking, depression, unusual weight change, abnormal bleeding, enlarged lymph nodes, angioedema, breast masses, and testicular masses.    Physical Exam  General:  VS reviewed, alert, well-developed, and well-nourished.   Lungs:  Normal respiratory effort, chest expands symmetrically. Lungs are clear to auscultation, no crackles or wheezes. Heart:  Normal rate and regular rhythm. S1 and S2 normal without gallop, murmur, click, rub or other extra sounds. Abdomen:  left lower quadrant on pannus is approximately 1 cm x 1.5 cm in diameter, circular and pink healing wound with hyperpigmented border. No granulation tissue, purulence, or tenderness.  Neurologic:  cranial nerves II-XII intact.   Skin:  turgor normal and color normal.   Psych:  good eye contact, not anxious appearing, and not depressed appearing.  Impression & Recommendations:  Problem # 1:  PRESSURE ULCER OTHER SITE (ICD-707.09) Assessment Improved Much improved.  No longer requires wound care, continues to heal well.  Orders: [facility]- Est  Level 4 (11914)  Problem # 2:  ESSENTIAL HYPERTENSION, BENIGN (ICD-401.1) Assessment: Improved BP well controlled.  Refilled meds today Her updated medication list for this problem includes:    Triamterene-hctz 37.5-25 Mg Tabs (Triamterene-hctz) .Marland Kitchen... Daily  Orders: [facility]- Est  Level 4 (78295)  Problem # 3:  UNSPECIFIED PSYCHOSIS (ICD-298.9) Assessment: Improved Currently doing very well.   Mood has seemed to stabalize per pt and her guardian (Arlene). Hx of multiple overdoses in the past, but none recentyl.  Living with sister and brother, good support system   Problem # 4:  MENTAL RETARDATION (ICD-319) Assessment: Unchanged  Orders: [facility]- Est  Level 4 (62130)  Complete Medication List: 1)  Poly Tan Dm Suspension  .Marland Kitchen.. 1 teaspoon q12hr as needed cough 2)  Triamterene-hctz 37.5-25 Mg Tabs (Triamterene-hctz) .... Daily 3)  Aspir-low 81 Mg Tbec (Aspirin) .... Daily 4)  Fluoxetine Hcl 40 Mg Caps (Fluoxetine hcl) .... Daily per [facility] 5)  Colace 100 Mg Caps (Docusate sodium) .... 2 tabs two times a day 6)  Wellbutrin Sr 150 Mg Xr12h-tab (Bupropion hcl) .... Two times a day per [facility] 7)  Omeprazole 20 Mg Cpdr (Omeprazole) .... Daily for reflux 8)  Benztropine Mesylate 1 Mg Tabs (Benztropine mesylate) .... Qhs per [facility] 9)  Lorazepam 0.5 Mg Tabs (Lorazepam) .... Qid prn agitation per [facility] 10)  Mirtazapine 15 Mg Tabs (Mirtazapine) .... By mouth at bedtime 11)  Risperidone 2 Mg Tabs (Risperidone) .... Qhs per [facility] 12)  Zonisamide 100 Mg Caps (Zonisamide) .... Qhs per [facility] 13)  Etodolac 400 Mg Tabs (Etodolac) .Marland Kitchen.. 1 po bid per dr Montez Morita (ortho) 14)  Hydrocodone-acetaminophen 10-650 Mg Tabs (Hydrocodone-acetaminophen) .... Q8hr per dr Montez Morita (ortho) 15)  Methocarbamol 500 Mg Tabs (Methocarbamol) .... Bid per dr Montez Morita (ortho) 16)  Fluticasone Propionate 0.05 % Crea (Fluticasone propionate) .... Two times a day prn 17)  Oxycodone-acetaminophen 5-500 Mg Caps (Oxycodone-acetaminophen) .... Q3hr as needed per dr Montez Morita 18)  Restore Wound Care Dressing Pads (Wound dressings) .... Apply to wound once a week. 19)  Saphris 10 Mg Subl (Asenapine maleate) .... Per [facility] 20)  Nystop 100000 Unit/gm Powd (Nystatin) .... Apply to itchy areas on lower abdomen two times a day as needed. disp 1 large bottle  Other Orders: Flu  Vaccine 39yrs + (86578) Admin 1st Vaccine (46962) Admin 1st Vaccine [facility]) [id number])  Patient Instructions: 1)  Very nice to meet you today! 2)  Your ulcer on your stomach looks good, if it starts to bother you, you can put neosporin on it. 3)  I refilled your meds for you and sent them to the pharmacy.  4)  Please schedule a follow-up appointment in 6 months .  5)  Please schedule a follow-up appointment as needed .  Prescriptions: TRIAMTERENE-HCTZ 37.5-25 MG TABS (TRIAMTERENE-HCTZ) daily  #30 x 6   Entered and Authorized by:   Alvia Grove DO   Signed by:   Alvia Grove DO on 02/17/2010   Method used:   Electronically to        [facility] (retail)       [address]       [place], [place]  [id number]       Ph: [phone] or [phone]  Fax: [phone]   RxID:   [id number] ASPIR-LOW 81 MG TBEC (ASPIRIN) daily  #30 x 5   Entered and Authorized by:   Alvia Grove DO   Signed by:   Alvia Grove DO on 02/17/2010   Method used:   Electronically to        [facility] (retail)       [address]       [place], [place]  [id number]       Ph: [phone] or [phone]       Fax: [phone]   RxID:   [id number]    Influenza Vaccine    Vaccine Type: Fluvax 3+    Site: right deltoid    Mfr: [employer]Smith[employer]    Dose: 0.5 ml    Route: IM    Given by: Garen Grams LPN    Exp. Date: 11/09/2010    Lot #: [id number]    VIS given: 12/07/09 version given February 17, 2010.  Flu Vaccine Consent Questions    Do you have a history of severe allergic reactions to this vaccine? no    Any prior history of allergic reactions to egg and/or gelatin? no    Do you have a sensitivity to the preservative Thimersol? no    Do you have a past history of Guillan-Barre Syndrome? no    Do you currently have an acute febrile illness? no    Have you ever had a severe reaction to latex? no    Vaccine information given and explained to patient? yes    Are  you currently pregnant? no

## 2010-06-14 NOTE — Progress Notes (Signed)
Summary: Ref Req  Phone Note Call from Patient Call back at [phone]   Caller: Sister-Earline Ruperto Summary of Call: Would like to get her referred back to the wound clinic. Initial call taken by: Clydell Hakim,  January 25, 2010 9:11 AM    Is suppose to be seeing Wallene Huh for a work in.  If he feels a referral is needed then that's fine.  If not, she can wait until she sees me.

## 2010-06-14 NOTE — Assessment & Plan Note (Signed)
Summary: NP,Lauren Wood   Vital Signs:  Patient profile:   50 year old female Height:      64.5 inches Weight:      329.5 pounds BMI:     55.89 Temp:     98.3 degrees F oral Pulse rate:   88 / minute BP sitting:   96 / 64  (right arm) Cuff size:   regular  Vitals Entered By: Garen Grams LPN (August 02, 2009 8:56 AM) CC: establish care Is Patient Diabetic? No Pain Assessment Patient in pain? no        CC:  establish care.  History of Present Illness: Here to establish care with new PCP.  no new complaints at this time.   Habits & Providers  Alcohol-Tobacco-Diet     Alcohol drinks/day: 0     Tobacco Status: never  Exercise-Depression-Behavior     STD Risk: never     Drug Use: never  Current Medications (verified): 1)  Poly Tan Dm Suspension 2)  Triamterene-Hctz 37.5-25 Mg Tabs (Triamterene-Hctz) .... Daily 3)  Aspir-Low 81 Mg Tbec (Aspirin) .... Daily 4)  Fluoxetine Hcl 40 Mg Caps (Fluoxetine Hcl) .... Daily Per [facility] 5)  Colace 100 Mg Caps (Docusate Sodium) .... As Needed Constipation 6)  Bupropion Hcl 150 Mg Xr12h-Tab (Bupropion Hcl) .... Once Daily Per [facility] 7)  Omeprazole 20 Mg Cpdr (Omeprazole) .... Daily For Reflux 8)  Benztropine Mesylate 1 Mg Tabs (Benztropine Mesylate) .... Per [facility] 9)  Lorazepam 0.5 Mg Tabs (Lorazepam) .... Per [facility] 10)  Mirtazapine 15 Mg Tabs (Mirtazapine) .... By Mouth At Bedtime 11)  Risperidone 2 Mg Tabs (Risperidone) .... Per [facility] 12)  Zonisamide 100 Mg Caps (Zonisamide) .... Per [facility] 13)  Etodolac 400 Mg Tabs (Etodolac) .... Per Dr Montez Morita (Ortho) 14)  Hydrocodone-Acetaminophen 10-650 Mg Tabs (Hydrocodone-Acetaminophen) .... Per Dr Montez Morita (Ortho) 15)  Methocarbamol 500 Mg Tabs (Methocarbamol) .... Per Dr Montez Morita (Ortho)  Allergies (verified): 1)  ! Vasotec  Past History:  Past Medical History: h/o TB treated with [facility] 4/02--tylenol OD - [facility] OSTEOARTHRITIS, MULTIPLE JOINTS (ICD-715.89) - see by orthopedist Dr Montez Morita ALLERGIC RHINITIS (ICD-477.9) Hx of CHF (ICD-428.0) ESSENTIAL HYPERTENSION, BENIGN (ICD-401.1) SLEEP DISORDER (ICD-780.50) BEHAVIOR PROBLEM (ICD-V40.9) GERD (ICD-530.81) MORBID OBESITY (ICD-278.01) MENTAL RETARDATION (ICD-319)  Family History: DM--mom, sister  htn--mom, bro, sister stroke - sister  Social History: lives with sister Tamirah George  and has some serivces through [employer].  no tob/etoh/drugsSmoking Status:  never Drug Use:  never STD Risk:  never  Review of Systems       no complaints or concerns today.  denies chest pain.  denies shortness of breath.  denies abdominal pain.  denies fevers.    Physical Exam  General:  alert.  morbidly obese.  slow in responses.   Head:  normocephalic and atraumatic.   Lungs:  Normal respiratory effort, chest expands symmetrically. Lungs are clear to auscultation, no crackles or wheezes. Heart:  Normal rate and regular rhythm. S1 and S2 normal without gallop, murmur, click, rub or other extra sounds. Abdomen:  soft, non-tender, and normal bowel sounds.  morbidly obese Msk:  walks with cane Neurologic:  obviously conginitvely impaired.    Impression & Recommendations:  Problem # 1:  ESSENTIAL HYPERTENSION, BENIGN (ICD-401.1) Assessment New at goal. will await records from old PCP about follow up needed at this time.   Her updated medication list for this problem includes:  Triamterene-hctz 37.5-25 Mg Tabs (Triamterene-hctz) .Marland Kitchen... Daily  Problem # 2:  BEHAVIOR PROBLEM (ICD-V40.9) Assessment: New reportedly some of her meds need closer monitoring.  will await records from previous pcp about what is due next.   Problem # 3:  PREVENTIVE HEALTH CARE (ICD-V70.0) Assessment: New unclear at this time what she has had done in terms of prevention and what she is due for.  will await records.  suspect will need some blood  work, catch up on mammogram/pap/etc.   Complete Medication List: 1)  Poly Tan Dm Suspension  2)  Triamterene-hctz 37.5-25 Mg Tabs (Triamterene-hctz) .... Daily 3)  Aspir-low 81 Mg Tbec (Aspirin) .... Daily 4)  Fluoxetine Hcl 40 Mg Caps (Fluoxetine hcl) .... Daily per [facility] 5)  Colace 100 Mg Caps (Docusate sodium) .... As needed constipation 6)  Bupropion Hcl 150 Mg Xr12h-tab (Bupropion hcl) .... Once daily per [facility] 7)  Omeprazole 20 Mg Cpdr (Omeprazole) .... Daily for reflux 8)  Benztropine Mesylate 1 Mg Tabs (Benztropine mesylate) .... Per [facility] 9)  Lorazepam 0.5 Mg Tabs (Lorazepam) .... Per [facility] 10)  Mirtazapine 15 Mg Tabs (Mirtazapine) .... By mouth at bedtime 11)  Risperidone 2 Mg Tabs (Risperidone) .... Per [facility] 12)  Zonisamide 100 Mg Caps (Zonisamide) .... Per [facility] 13)  Etodolac 400 Mg Tabs (Etodolac) .... Per dr Montez Morita (ortho) 14)  Hydrocodone-acetaminophen 10-650 Mg Tabs (Hydrocodone-acetaminophen) .... Per dr Montez Morita (ortho) 15)  Methocarbamol 500 Mg Tabs (Methocarbamol) .... Per dr Montez Morita (ortho)  Patient Instructions: 1)  follow up in 4-6 weeks once we have all your records for some blood work, etc.  2)  it was nice to meet you today.   Prevention & Chronic Care Immunizations   Influenza vaccine: Not documented    Tetanus booster: 11/12/2001: Done.    Pneumococcal vaccine: Not documented  Other Screening   Pap smear: Done.  (04/15/2003)    Mammogram: Done.  (04/15/2003)   Smoking status: never  (08/02/2009)  Lipids   Total Cholesterol: Not documented   LDL: Not documented   LDL Direct: Not documented   HDL: Not documented   Triglycerides: Not documented  Hypertension   Last Blood Pressure: 96 / 64  (08/02/2009)   Serum creatinine: Not documented   Serum potassium Not documented    Hypertension flowsheet reviewed?: Yes   Progress toward BP goal: At goal  Self-Management Support :   Personal Goals  (by the next clinic visit) :      Personal blood pressure goal: 140/90  (08/02/2009)   Hypertension self-management support: Not documented    Hypertension self-management support not done because: Good outcomes  (08/02/2009)

## 2010-06-14 NOTE — Letter (Signed)
Summary: Lipid Letter  [facility]  [address]   [place], [place] [address]   Phone: [phone]  Fax: [phone]    09/03/2009  Teasha Murrillo [address] [place], [place]  [address]  Dear Talbert Forest:  We have carefully reviewed your last lipid profile from 09/01/2009 and the results are noted below with a summary of recommendations for lipid management.    Cholesterol:       116     Goal: < 200   HDL "good" Cholesterol:   48     Goal: > 40   LDL "bad" Cholesterol:   56     Goal: < 130   Triglycerides:       58     Goal: < 150    Your cholesterol is excellent. Your blood did show a slight anemia which we will monitor over time.  If you have questions please feel free to call the family practice center.    TLC Diet (Therapeutic Lifestyle Change): Saturated Fats & Transfatty acids should be kept < 7% of total calories ***Reduce Saturated Fats Polyunstaurated Fat can be up to 10% of total calories Monounsaturated Fat Fat can be up to 20% of total calories Total Fat should be no greater than 25-35% of total calories Carbohydrates should be 50-60% of total calories Protein should be approximately 15% of total calories Fiber should be at least 20-30 grams a day ***Increased fiber may help lower LDL Total Cholesterol should be < 200mg /day Consider adding plant stanol/sterols to diet (example: Benacol spread) ***A higher intake of unsaturated fat may reduce Triglycerides and Increase HDL    Adjunctive Measures (may lower LIPIDS and reduce risk of Heart Attack) include: Aerobic Exercise (20-30 minutes 3-4 times a week) Limit Alcohol Consumption Weight Reduction Aspirin 75-81 mg a day by mouth (if not allergic or contraindicated) Dietary Fiber 20-30 grams a day by mouth     Current Medications: 1)    Poly Tan Dm Suspension  .Marland Kitchen.. 1 teaspoon q12hr as needed cough 2)    Triamterene-hctz 37.5-25 Mg Tabs (Triamterene-hctz) .... Daily 3)    Aspir-low 81 Mg  Tbec (Aspirin) .... Daily 4)    Fluoxetine Hcl 40 Mg Caps (Fluoxetine hcl) .... Daily per [facility] 5)    Colace 100 Mg Caps (Docusate sodium) .... 2 tabs two times a day 6)    Wellbutrin Sr 150 Mg Xr12h-tab (Bupropion hcl) .... Two times a day per [facility] 7)    Omeprazole 20 Mg Cpdr (Omeprazole) .... Daily for reflux 8)    Benztropine Mesylate 1 Mg Tabs (Benztropine mesylate) .... Qhs per [facility] 9)    Lorazepam 0.5 Mg Tabs (Lorazepam) .... Qid prn agitation per [facility] 10)    Mirtazapine 15 Mg Tabs (Mirtazapine) .... By mouth at bedtime 11)    Risperidone 2 Mg Tabs (Risperidone) .... Qhs per [facility] 12)    Zonisamide 100 Mg Caps (Zonisamide) .... Qhs per [facility] 13)    Etodolac 400 Mg Tabs (Etodolac) .Marland Kitchen.. 1 po bid per dr Montez Morita (ortho) 14)    Hydrocodone-acetaminophen 10-650 Mg Tabs (Hydrocodone-acetaminophen) .... Q8hr per dr Montez Morita (ortho) 15)    Methocarbamol 500 Mg Tabs (Methocarbamol) .... Bid per dr Montez Morita (ortho) 16)    Fluticasone Propionate 0.05 % Crea (Fluticasone propionate) .... Two times a day prn 17)    Oxycodone-acetaminophen 5-500 Mg Caps (Oxycodone-acetaminophen) .... Q3hr as needed per dr Montez Morita  If you have any questions, please  call. We appreciate being able to work with you.   Sincerely,    [facility] Ancil Boozer  MD  Appended Document: Lipid Letter mailed.

## 2010-06-14 NOTE — Assessment & Plan Note (Signed)
Summary: ulcer follow up/ls   Vital Signs:  Patient profile:   50 year old female Height:      64.5 inches Weight:      302.4 pounds BMI:     51.29 Temp:     98.4 degrees F oral Pulse rate:   108 / minute BP sitting:   145 / 85  (left arm) Cuff size:   large  Vitals Entered By: Garen Grams LPN (January 27, 2010 3:35 PM) CC: f/u wound on stomach Is Patient Diabetic? No Pain Assessment Patient in pain? no        Primary Care Provider:  Alvia Grove DO  CC:  f/u wound on stomach.  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 Followed by wound care but now improved thus discharged back to [place]. Restore placed at last wound care visit to aid healing.   Denies: fever, chills, nausea, emesis, diarrhea, constipation, drainage from wound.   Habits & Providers  Alcohol-Tobacco-Diet     Alcohol drinks/day: 0     Tobacco Status: never  Current Medications (verified): 1)  Poly Tan Dm Suspension .Marland Kitchen.. 1 Teaspoon Q12hr As Needed Cough 2)  Triamterene-Hctz 37.5-25 Mg Tabs (Triamterene-Hctz) .... Daily 3)  Aspir-Low 81 Mg Tbec (Aspirin) .... Daily 4)  Fluoxetine Hcl 40 Mg Caps (Fluoxetine Hcl) .... Daily Per [facility] 5)  Colace 100 Mg Caps (Docusate Sodium) .... 2 Tabs Two Times A Day 6)  Wellbutrin Sr 150 Mg Xr12h-Tab (Bupropion Hcl) .... Two Times A Day Per [facility] 7)  Omeprazole 20 Mg Cpdr (Omeprazole) .... Daily For Reflux 8)  Benztropine Mesylate 1 Mg Tabs (Benztropine Mesylate) .... Qhs Per [facility] 9)  Lorazepam 0.5 Mg Tabs (Lorazepam) .... Qid Prn Agitation Per [facility] 10)  Mirtazapine 15 Mg Tabs (Mirtazapine) .... By Mouth At Bedtime 11)  Risperidone 2 Mg Tabs (Risperidone) .... Qhs Per [facility] 12)  Zonisamide 100 Mg Caps (Zonisamide) .... Qhs Per [facility] 13)  Etodolac 400 Mg Tabs (Etodolac) .Marland Kitchen.. 1 Po Bid Per Dr Montez Morita (Ortho) 14)  Hydrocodone-Acetaminophen 10-650 Mg Tabs  (Hydrocodone-Acetaminophen) .... Q8hr Per Dr Montez Morita (Ortho) 15)  Methocarbamol 500 Mg Tabs (Methocarbamol) .... Bid Per Dr Montez Morita (Ortho) 16)  Fluticasone Propionate 0.05 % Crea (Fluticasone Propionate) .... Two Times A Day Prn 17)  Oxycodone-Acetaminophen 5-500 Mg Caps (Oxycodone-Acetaminophen) .... Q3hr As Needed Per Dr Montez Morita 18)  Restore Wound Care Dressing  Pads (Wound Dressings) .... Apply To Wound Once A Week. 19)  Saphris 10 Mg Subl (Asenapine Maleate) .... Per [facility] 20)  Nystop 100000 Unit/gm Powd (Nystatin) .... Apply To Itchy Areas On Lower Abdomen Two Times A Day As Needed. Disp 1 Large Bottle  Allergies (verified): 1)  ! Vasotec  Physical Exam  General:  alert.  morbidly obese.  slow in responses but quite pleasant VS reviewed  Abdomen:  left lower quadrant on pannus is approximately 2 cm x 2 cm diameter circular pink healing wound with hyperpigmented border without granulation tissue, purulence, or tenderness. Much better than last visit per nurse report   Impression & Recommendations:  Problem # 1:  PRESSURE ULCER OTHER SITE (ICD-707.09)  Healing well. Restore today. Follow up with nurse visit one week.   Orders: [facility]- Est Level  3 (16109)  Complete Medication List: 1)  Poly Tan Dm Suspension  .Marland Kitchen.. 1 teaspoon q12hr as needed cough 2)  Triamterene-hctz 37.5-25 Mg Tabs (Triamterene-hctz) .... Daily 3)  Aspir-low 81 Mg Tbec (Aspirin) .... Daily 4)  Fluoxetine Hcl 40 Mg Caps (Fluoxetine hcl) .... Daily per [facility] 5)  Colace 100 Mg Caps (Docusate sodium) .... 2 tabs two times a day 6)  Wellbutrin Sr 150 Mg Xr12h-tab (Bupropion hcl) .... Two times a day per [facility] 7)  Omeprazole 20 Mg Cpdr (Omeprazole) .... Daily for reflux 8)  Benztropine Mesylate 1 Mg Tabs (Benztropine mesylate) .... Qhs per [facility] 9)  Lorazepam 0.5 Mg Tabs (Lorazepam) .... Qid prn agitation per [facility] 10)  Mirtazapine 15 Mg Tabs (Mirtazapine) .... By mouth at  bedtime 11)  Risperidone 2 Mg Tabs (Risperidone) .... Qhs per [facility] 12)  Zonisamide 100 Mg Caps (Zonisamide) .... Qhs per [facility] 13)  Etodolac 400 Mg Tabs (Etodolac) .Marland Kitchen.. 1 po bid per dr Montez Morita (ortho) 14)  Hydrocodone-acetaminophen 10-650 Mg Tabs (Hydrocodone-acetaminophen) .... Q8hr per dr Montez Morita (ortho) 15)  Methocarbamol 500 Mg Tabs (Methocarbamol) .... Bid per dr Montez Morita (ortho) 16)  Fluticasone Propionate 0.05 % Crea (Fluticasone propionate) .... Two times a day prn 17)  Oxycodone-acetaminophen 5-500 Mg Caps (Oxycodone-acetaminophen) .... Q3hr as needed per dr Montez Morita 18)  Restore Wound Care Dressing Pads (Wound dressings) .... Apply to wound once a week. 19)  Saphris 10 Mg Subl (Asenapine maleate) .... Per [facility] 20)  Nystop 100000 Unit/gm Powd (Nystatin) .... Apply to itchy areas on lower abdomen two times a day as needed. disp 1 large bottle  Patient Instructions: 1)  Follow up in one week to check wound with nurse visit.

## 2010-06-14 NOTE — Assessment & Plan Note (Signed)
Summary: place on left side,tcb   Vital Signs:  Patient profile:   50 year old female Height:      64.5 inches Weight:      322.6 pounds BMI:     54.72 Temp:     99.1 degrees F oral Pulse rate:   103 / minute BP sitting:   139 / 88  (left arm) Cuff size:   regular  Vitals Entered By: Garen Grams LPN (Oct 01, 2009 10:00 AM) CC: ?boil on left side, memory Is Patient Diabetic? No Pain Assessment Patient in pain? no        CC:  ?boil on left side and memory.  History of Present Illness: ? boil: has been there for a while but now with a little pain.  ? if draining.  denies fevers, no history of boils.    memory: sister with whom she lives is concerned about her memory.  thinks it has been getting worse - forgetting more and more things.  they reportely have discussed this with mental health who didn't have anything to add.  they are unsure about her baseline but they states she did graduate from a high school designed for those with mental retardation.   Habits & Providers  Alcohol-Tobacco-Diet     Tobacco Status: never  Current Medications (verified): 1)  Poly Tan Dm Suspension .Marland Kitchen.. 1 Teaspoon Q12hr As Needed Cough 2)  Triamterene-Hctz 37.5-25 Mg Tabs (Triamterene-Hctz) .... Daily 3)  Aspir-Low 81 Mg Tbec (Aspirin) .... Daily 4)  Fluoxetine Hcl 40 Mg Caps (Fluoxetine Hcl) .... Daily Per [facility] 5)  Colace 100 Mg Caps (Docusate Sodium) .... 2 Tabs Two Times A Day 6)  Wellbutrin Sr 150 Mg Xr12h-Tab (Bupropion Hcl) .... Two Times A Day Per [facility] 7)  Omeprazole 20 Mg Cpdr (Omeprazole) .... Daily For Reflux 8)  Benztropine Mesylate 1 Mg Tabs (Benztropine Mesylate) .... Qhs Per [facility] 9)  Lorazepam 0.5 Mg Tabs (Lorazepam) .... Qid Prn Agitation Per [facility] 10)  Mirtazapine 15 Mg Tabs (Mirtazapine) .... By Mouth At Bedtime 11)  Risperidone 2 Mg Tabs (Risperidone) .... Qhs Per [facility] 12)  Zonisamide 100 Mg Caps (Zonisamide) .... Qhs Per  [facility] 13)  Etodolac 400 Mg Tabs (Etodolac) .Marland Kitchen.. 1 Po Bid Per Dr Montez Morita (Ortho) 14)  Hydrocodone-Acetaminophen 10-650 Mg Tabs (Hydrocodone-Acetaminophen) .... Q8hr Per Dr Montez Morita (Ortho) 15)  Methocarbamol 500 Mg Tabs (Methocarbamol) .... Bid Per Dr Montez Morita (Ortho) 16)  Fluticasone Propionate 0.05 % Crea (Fluticasone Propionate) .... Two Times A Day Prn 17)  Oxycodone-Acetaminophen 5-500 Mg Caps (Oxycodone-Acetaminophen) .... Q3hr As Needed Per Dr Montez Morita 18)  Restore Wound Care Dressing  Pads (Wound Dressings) .... Apply To Wound Once A Week.  Allergies (verified): 1)  ! Vasotec  Past History:  Past medical, surgical, family and social histories (including risk factors) reviewed for relevance to current acute and chronic problems.  Past Medical History: Reviewed history from 08/02/2009 and no changes required. h/o TB treated with [facility] suicide attempt 4/02--tylenol OD - seen at [facility] OSTEOARTHRITIS, MULTIPLE JOINTS (ICD-715.89) - see by orthopedist Dr Montez Morita ALLERGIC RHINITIS (ICD-477.9) Hx of CHF (ICD-428.0) ESSENTIAL HYPERTENSION, BENIGN (ICD-401.1) SLEEP DISORDER (ICD-780.50) BEHAVIOR PROBLEM (ICD-V40.9) GERD (ICD-530.81) MORBID OBESITY (ICD-278.01) MENTAL RETARDATION (ICD-319)  Past Surgical History: Reviewed history from 09/01/2009 and no changes required. BTL - 09/01/2002, pe tubes - cyst/lipoma removal left scalp 07/2009  Family History: Reviewed history from 08/02/2009 and no changes required. DM--mom, sister  htn--mom, bro, sister stroke -  sister  Social History: Reviewed history from 08/02/2009 and no changes required. lives with sister Prapti Grussing  and has some serivces through [employer].  no tob/etoh/drugs  Review of Systems       per HPI  Physical Exam  General:  alert.  morbidly obese.  slow in responses but quite pleasant VS reviewed - mildy tachycardic.  Abdomen:  left lower quadrant on pannus is approximately  4-5cm in diameter circular wound with some necrotic tissue which was debrided and covered with a restore bandage.  Psych:  obviously conginitvely impaired. see MMSE    Geriatric Assessment:  Mental Status Exam: (value/max value)    Orientation to Time: 3/5    Orientation to Place: 3/5    Registration: 2/3    Attention/Calculation: 5/5    Recall: 0/3    Language-name 2 objects: 2/2    Language-repeat: 0/1    Language-follow 3-step command: 3/3    Language-read and follow direction: 1/1    Write a sentence: 1/1    Copy design: 0/1    unclear baseline, known mental retardation.  MSE Total score: 20/30  Impression & Recommendations:  Problem # 1:  PRESSURE ULCER OTHER SITE (ICD-707.09) Assessment New discussed wound care, f/u 1 week to see if improving.  Orders: [facility]- Est  Level 4 (16109)  Problem # 2:  MENTAL RETARDATION (ICD-319) Assessment: Unchanged discussed that baseline is unknown but we now have somewhere to start.  can monitor over time, avoid drugs that alter memory as much as possible.   Orders: [facility]- Est  Level 4 (60454)  Complete Medication List: 1)  Poly Tan Dm Suspension  .Marland Kitchen.. 1 teaspoon q12hr as needed cough 2)  Triamterene-hctz 37.5-25 Mg Tabs (Triamterene-hctz) .... Daily 3)  Aspir-low 81 Mg Tbec (Aspirin) .... Daily 4)  Fluoxetine Hcl 40 Mg Caps (Fluoxetine hcl) .... Daily per [facility] 5)  Colace 100 Mg Caps (Docusate sodium) .... 2 tabs two times a day 6)  Wellbutrin Sr 150 Mg Xr12h-tab (Bupropion hcl) .... Two times a day per [facility] 7)  Omeprazole 20 Mg Cpdr (Omeprazole) .... Daily for reflux 8)  Benztropine Mesylate 1 Mg Tabs (Benztropine mesylate) .... Qhs per [facility] 9)  Lorazepam 0.5 Mg Tabs (Lorazepam) .... Qid prn agitation per [facility] 10)  Mirtazapine 15 Mg Tabs (Mirtazapine) .... By mouth at bedtime 11)  Risperidone 2 Mg Tabs (Risperidone) .... Qhs per [facility] 12)  Zonisamide 100 Mg Caps (Zonisamide) .... Qhs  per [facility] 13)  Etodolac 400 Mg Tabs (Etodolac) .Marland Kitchen.. 1 po bid per dr Montez Morita (ortho) 14)  Hydrocodone-acetaminophen 10-650 Mg Tabs (Hydrocodone-acetaminophen) .... Q8hr per dr Montez Morita (ortho) 15)  Methocarbamol 500 Mg Tabs (Methocarbamol) .... Bid per dr Montez Morita (ortho) 16)  Fluticasone Propionate 0.05 % Crea (Fluticasone propionate) .... Two times a day prn 17)  Oxycodone-acetaminophen 5-500 Mg Caps (Oxycodone-acetaminophen) .... Q3hr as needed per dr Montez Morita 18)  Restore Wound Care Dressing Pads (Wound dressings) .... Apply to wound once a week.  Patient Instructions: 1)  Change the dressing on the wound on her abdomen every Friday.  2)  Keep Andreka's follow up as scheduled. Prescriptions: RESTORE WOUND CARE DRESSING  PADS (WOUND DRESSINGS) apply to wound once a week.  #4 x 6   Entered and Authorized by:   Ancil Boozer  MD   Signed by:   Ancil Boozer  MD on 10/01/2009   Method used:   Electronically to  [facility] (retail)       [address]       [place], [place]  [id number]       Ph: [phone] or [phone]       Fax: [phone]   RxID:   [id number]

## 2010-06-14 NOTE — Miscellaneous (Signed)
Summary: re: ulcer  Clinical Lists Changes sister in to discuss wound care and follow up. patient was seen at wound care center last week and was told that there is no need to follow up there, she needs to come back here and continue wound care as previously.   appointment scheduled with Dr. Wallene Huh for work in tomorrow to evaluate wound and and order continued care by RN if indicated and appointment is scheduled with Dr. Deeann Cree for 02/17/2010 . Theresia Lo RN  January 26, 2010 9:58 AM

## 2010-06-14 NOTE — Miscellaneous (Signed)
Summary: [employer] Consent  [employer] Person Consent   Imported By: Knox Royalty 08/02/2009 11:28:41  _____________________________________________________________________  External Attachment:    Type:   Image     Comment:   External Document

## 2010-06-14 NOTE — Assessment & Plan Note (Signed)
Summary: change patch/eo  Nurse Visit  Sister stated she could not bring patient in next week on Tuesday so she states MD told her she could come in today.  restore changed . ulcer cleaned with sterile saline and dried well. ulcer 3 cm X 4 cm. new Restore dressing applied. Theresia Lo RN  October 22, 2009 4:50 PM   Allergies: 1)  ! Vasotec  Orders Added: 1)  Est Level 1- [facility] [[id number]]

## 2010-06-14 NOTE — Assessment & Plan Note (Signed)
Summary: CHECK WOUND/[facility]  Nurse Visit Sister states patient did not follow up with wound clinic because she was a patient in a psychiatric hospital in [place]. she was discharged this week.  sister wanted RN to check wound . she does have appointment scheduled at [facility] 01/18/2010. wound is 1 cm in diameter. cleaned with saline and dried well.   Dr. Deirdre Priest advises may reapply Restore and this is applied . she will follow up with wound clinic . Theresia Lo RN  January 14, 2010 4:04 PM   Allergies: 1)  ! Vasotec  Orders Added: 1)  Est Level 1- [facility] [[id number]]

## 2010-06-14 NOTE — Progress Notes (Signed)
Summary: phn msg  Phone Note Call from Patient Call back at Home Phone [phone]   Caller: Loel Ro Summary of Call: needs to talk to nurse Initial call taken by: De Nurse,  December 27, 2009 4:15 PM  Follow-up for Phone Call        her sister called. she missed her appt this am. [facility].  states she had a breakdown last week. she will call us when she gets out to reschedule the appt. she has also cancelled the appt at the wound care center Follow-up by: Golden Circle RN,  December 27, 2009 4:32 PM

## 2010-06-14 NOTE — Assessment & Plan Note (Signed)
Summary: dressing change/ls  Nurse Visit  In for wound recheck and dressing change. reddened area slighty raised  and 1.5 cm in diameter. area around this healed . Marland KitchenCleaned with saline    Dr. Sandi Mealy notified and she applied silver nitrate stick again.  Restore dressing applied. Patient has follow up appointment with Dr. Sandi Mealy next week.  Theresia Lo RN  December 13, 2009 3:11 PM   Allergies: 1)  ! Vasotec  Orders Added: 1)  Est Level 1- [facility] [[id number]]

## 2010-06-14 NOTE — Assessment & Plan Note (Signed)
Summary: CHANGE PAD/KH  Nurse Visit In to replace Restore . area cleaned with sterile saline and dried well. size today 3 cm X 3 cm. new Restore applied. will follow up next week to change again. will ask Dr. Sandi Mealy  when patient needs to  be seen by MD . Theresia Lo RN  October 29, 2009 9:53 AM   Please have her follow up for wound in about 2 weeks for recheck. Ancil Boozer  MD  October 29, 2009 1:32 PM  will notifiy sister when patient comes in this week. Theresia Lo RN  November 01, 2009 4:03 PM   Allergies: 1)  ! Vasotec  Orders Added: 1)  Est Level 1- [facility] [[id number]]
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
## 2010-06-14 NOTE — Assessment & Plan Note (Signed)
Summary: dressing change/eo  Nurse Visit In office for wound recheck and change Restore. ulcer size today 2.5 X 3 cm. no drainage, redness , swelling, or signs of infection noted. will come back next week to change dressing and will follow up with Dr. Sandi Mealy on 11/18/2009. this is her first available appointment. Theresia Lo RN  November 05, 2009 9:11 AM   Allergies: 1)  ! Vasotec  Orders Added: 1)  Est Level 1- [facility] [[address]]

## 2010-06-14 NOTE — Assessment & Plan Note (Signed)
Summary: wound checkand change Restore/ls   Vital Signs:  Patient profile:   50 year old female Height:      64.5 inches Weight:      314.5 pounds BMI:     53.34 Temp:     98.6 degrees F oral Pulse rate:   99 / minute BP sitting:   126 / 83  (left arm) Cuff size:   large  Vitals Entered By: Garen Grams LPN (November 18, 8293 2:53 PM) CC: f/u, dressing change Is Patient Diabetic? No Pain Assessment Patient in pain? no        CC:  f/u and dressing change.  History of Present Illness: f/u wound: has been doing well.  no pain. needs dressing changed today.   Habits & Providers  Alcohol-Tobacco-Diet     Tobacco Status: never  Current Medications (verified): 1)  Poly Tan Dm Suspension .Marland Kitchen.. 1 Teaspoon Q12hr As Needed Cough 2)  Triamterene-Hctz 37.5-25 Mg Tabs (Triamterene-Hctz) .... Daily 3)  Aspir-Low 81 Mg Tbec (Aspirin) .... Daily 4)  Fluoxetine Hcl 40 Mg Caps (Fluoxetine Hcl) .... Daily Per [facility] 5)  Colace 100 Mg Caps (Docusate Sodium) .... 2 Tabs Two Times A Day 6)  Wellbutrin Sr 150 Mg Xr12h-Tab (Bupropion Hcl) .... Two Times A Day Per [facility] 7)  Omeprazole 20 Mg Cpdr (Omeprazole) .... Daily For Reflux 8)  Benztropine Mesylate 1 Mg Tabs (Benztropine Mesylate) .... Qhs Per [facility] 9)  Lorazepam 0.5 Mg Tabs (Lorazepam) .... Qid Prn Agitation Per [facility] 10)  Mirtazapine 15 Mg Tabs (Mirtazapine) .... By Mouth At Bedtime 11)  Risperidone 2 Mg Tabs (Risperidone) .... Qhs Per [facility] 12)  Zonisamide 100 Mg Caps (Zonisamide) .... Qhs Per [facility] 13)  Etodolac 400 Mg Tabs (Etodolac) .Marland Kitchen.. 1 Po Bid Per Dr Montez Morita (Ortho) 14)  Hydrocodone-Acetaminophen 10-650 Mg Tabs (Hydrocodone-Acetaminophen) .... Q8hr Per Dr Montez Morita (Ortho) 15)  Methocarbamol 500 Mg Tabs (Methocarbamol) .... Bid Per Dr Montez Morita (Ortho) 16)  Fluticasone Propionate 0.05 % Crea (Fluticasone Propionate) .... Two Times A Day Prn 17)  Oxycodone-Acetaminophen 5-500 Mg Caps  (Oxycodone-Acetaminophen) .... Q3hr As Needed Per Dr Montez Morita 18)  Restore Wound Care Dressing  Pads (Wound Dressings) .... Apply To Wound Once A Week.  Allergies (verified): 1)  ! Vasotec  Past History:  Past medical, surgical, family and social histories (including risk factors) reviewed for relevance to current acute and chronic problems.  Past Medical History: Reviewed history from 08/02/2009 and no changes required. h/o TB treated with [facility] 4/02--tylenol OD - seen at [facility] OSTEOARTHRITIS, MULTIPLE JOINTS (ICD-715.89) - see by orthopedist Dr Montez Morita ALLERGIC RHINITIS (ICD-477.9) Hx of CHF (ICD-428.0) ESSENTIAL HYPERTENSION, BENIGN (ICD-401.1) SLEEP DISORDER (ICD-780.50) BEHAVIOR PROBLEM (ICD-V40.9) GERD (ICD-530.81) MORBID OBESITY (ICD-278.01) MENTAL RETARDATION (ICD-319)  Past Surgical History: Reviewed history from 09/01/2009 and no changes required. BTL - 09/01/2002, pe tubes - cyst/lipoma removal left scalp 07/2009  Family History: Reviewed history from 08/02/2009 and no changes required. DM--mom, sister  htn--mom, bro, sister stroke - sister  Social History: Reviewed history from 08/02/2009 and no changes required. lives with sister Jessah Danser  and has some serivces through [employer].  no tob/etoh/drugs  Review of Systems       per HPI  Physical Exam  General:  alert.  morbidly obese.  slow in responses but quite pleasant VS reviewed  Abdomen:  left lower quadrant on pannus is approximately 2cm diameter circular wound with healing tissues where previously  was larger but some hypergranulation tissue in the central area.  this was debrided with some silvernitrate. 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 continue restore dressing weekly for next month and  then MD visit in 1 month.   Orders: [facility]- Est Level  3 ([address])  Complete Medication List: 1)  Poly Tan Dm Suspension  .Marland Kitchen.. 1 teaspoon q12hr as needed cough 2)  Triamterene-hctz 37.5-25 Mg Tabs (Triamterene-hctz) .... Daily 3)  Aspir-low 81 Mg Tbec (Aspirin) .... Daily 4)  Fluoxetine Hcl 40 Mg Caps (Fluoxetine hcl) .... Daily per [facility] 5)  Colace 100 Mg Caps (Docusate sodium) .... 2 tabs two times a day 6)  Wellbutrin Sr 150 Mg Xr12h-tab (Bupropion hcl) .... Two times a day per [facility] 7)  Omeprazole 20 Mg Cpdr (Omeprazole) .... Daily for reflux 8)  Benztropine Mesylate 1 Mg Tabs (Benztropine mesylate) .... Qhs per [facility] 9)  Lorazepam 0.5 Mg Tabs (Lorazepam) .... Qid prn agitation per [facility] 10)  Mirtazapine 15 Mg Tabs (Mirtazapine) .... By mouth at bedtime 11)  Risperidone 2 Mg Tabs (Risperidone) .... Qhs per [facility] 12)  Zonisamide 100 Mg Caps (Zonisamide) .... Qhs per [facility] 13)  Etodolac 400 Mg Tabs (Etodolac) .Marland Kitchen.. 1 po bid per dr Montez Morita (ortho) 14)  Hydrocodone-acetaminophen 10-650 Mg Tabs (Hydrocodone-acetaminophen) .... Q8hr per dr Montez Morita (ortho) 15)  Methocarbamol 500 Mg Tabs (Methocarbamol) .... Bid per dr Montez Morita (ortho) 16)  Fluticasone Propionate 0.05 % Crea (Fluticasone propionate) .... Two times a day prn 17)  Oxycodone-acetaminophen 5-500 Mg Caps (Oxycodone-acetaminophen) .... Q3hr as needed per dr Montez Morita 18)  Restore Wound Care Dressing Pads (Wound dressings) .... Apply to wound once a week.  Patient Instructions: 1)  Please follow up weekly with nurse visits for dressing change. 2)  Please follow up with MD in 1 month.  3)  call with questions or concerns.

## 2010-06-14 NOTE — Miscellaneous (Signed)
Summary: update problems  Clinical Lists Changes  Problems: Removed problem of History of  CHF (ICD-428.0)    Reviewed E Chart and Centricity, pt has no records to indicate dx of CHF.

## 2010-06-14 NOTE — Letter (Signed)
Summary: Generic Letter  [facility]  [address]   [place], [place] [address]   Phone: [phone]  Fax: [phone]    10/15/2009  [facility] [address] [place], [place]  [address]  Dear Ms. Balistreri,  Your recent pap test was normal. Your next pap will be due in 3 years.  If you have questions please feel free to contact the [facility].     Sincerely,   Ancil Boozer  MD  Appended Document: Generic Letter mailed.

## 2010-06-14 NOTE — Assessment & Plan Note (Signed)
Summary: change patch/eo  Nurse Visit  patient in office to apply Restore dressing. Sister states the Restore placed last week came off about 2 days ago. Ulcer appears slightly smaller today. measures 3.5 x 3.5. no drainage noted . cleaned with sterile saline and new Restore applied. patient borught her own Restore in today. return in one week .  Theresia Lo RN  October 19, 2009 10:25 AM   Allergies: 1)  ! Vasotec  Orders Added: 1)  Est Level 1- [facility] [[id number]]

## 2010-06-14 NOTE — Miscellaneous (Signed)
Summary: ROI  ROI   Imported By: Clydell Hakim 08/03/2009 13:59:17  _____________________________________________________________________  External Attachment:    Type:   Image     Comment:   External Document

## 2010-06-14 NOTE — Assessment & Plan Note (Signed)
Summary: REPACKING WOUND/[facility]  Nurse Visit Patient in for dressing change with Restore. Wound size 2 cm x 2 cm. There appears to be hypergranulation tissue in an area approx size of dime. Dr. Sandi Mealy notified and she came in to check wound and applied silver nitrate to the area.  Restore dressing then applied.  Return in one week to change dressing.    Sister requests refill for patient for omeprazole and triamterene/ HCTZ. [facility]acy is [facility]. Theresia Lo RN  December 06, 2009 9:31 AM  refills sent.     Allergies: 1)  ! Vasotec  Orders Added: 1)  Est Level 1- [facility] [[address]] Prescriptions: OMEPRAZOLE 20 MG CPDR (OMEPRAZOLE) daily for reflux  #30 x 6   Entered and Authorized by:   Ancil Boozer  MD   Signed by:   Ancil Boozer  MD on 12/06/2009   Method used:   Electronically to        [facility] (retail)       [address]       [place], [place]  [id number]       Ph: [phone] or [phone]       Fax: [phone]   RxID:   [id number] TRIAMTERENE-HCTZ 37.5-25 MG TABS (TRIAMTERENE-HCTZ) daily  #30 x 6   Entered and Authorized by:   Ancil Boozer  MD   Signed by:   Ancil Boozer  MD on 12/06/2009   Method used:   Electronically to        [facility] (retail)       [address]       [place], [place]  [id number]       Ph: [phone] or [phone]       Fax: [phone]   RxID:   [id number]

## 2010-06-14 NOTE — Assessment & Plan Note (Signed)
Summary: cpp/eo   Vital Signs:  Patient profile:   50 year old female Height:      64.5 inches Weight:      327.8 pounds BMI:     55.60 Temp:     99.4 degrees F Pulse rate:   100 / minute BP sitting:   130 / 90  (left arm)  Vitals Entered By: Theresia Lo RN (Oct 12, 2009 9:16 AM) CC: CPE and recheck ulcer Is Patient Diabetic? No   CC:  CPE and recheck ulcer.  History of Present Illness: pap: here for pap smear.  not currently on menses.  denies problems with menses - not to heavy, not painful.    ulcer: doesn't want her sister to change dressing.  not having any pain.  no fevers  Habits & Providers  Alcohol-Tobacco-Diet     Tobacco Status: never  Mammogram  Procedure date:  11/12/2008  Findings:      Assessment: BIRADS 1. (approximate date was july 2010)  Current Medications (verified): 1)  Poly Tan Dm Suspension .Marland Kitchen.. 1 Teaspoon Q12hr As Needed Cough 2)  Triamterene-Hctz 37.5-25 Mg Tabs (Triamterene-Hctz) .... Daily 3)  Aspir-Low 81 Mg Tbec (Aspirin) .... Daily 4)  Fluoxetine Hcl 40 Mg Caps (Fluoxetine Hcl) .... Daily Per [facility] 5)  Colace 100 Mg Caps (Docusate Sodium) .... 2 Tabs Two Times A Day 6)  Wellbutrin Sr 150 Mg Xr12h-Tab (Bupropion Hcl) .... Two Times A Day Per [facility] 7)  Omeprazole 20 Mg Cpdr (Omeprazole) .... Daily For Reflux 8)  Benztropine Mesylate 1 Mg Tabs (Benztropine Mesylate) .... Qhs Per [facility] 9)  Lorazepam 0.5 Mg Tabs (Lorazepam) .... Qid Prn Agitation Per [facility] 10)  Mirtazapine 15 Mg Tabs (Mirtazapine) .... By Mouth At Bedtime 11)  Risperidone 2 Mg Tabs (Risperidone) .... Qhs Per [facility] 12)  Zonisamide 100 Mg Caps (Zonisamide) .... Qhs Per [facility] 13)  Etodolac 400 Mg Tabs (Etodolac) .Marland Kitchen.. 1 Po Bid Per Dr Montez Morita (Ortho) 14)  Hydrocodone-Acetaminophen 10-650 Mg Tabs (Hydrocodone-Acetaminophen) .... Q8hr Per Dr Montez Morita (Ortho) 15)  Methocarbamol 500 Mg Tabs (Methocarbamol) .... Bid Per Dr Montez Morita  (Ortho) 16)  Fluticasone Propionate 0.05 % Crea (Fluticasone Propionate) .... Two Times A Day Prn 17)  Oxycodone-Acetaminophen 5-500 Mg Caps (Oxycodone-Acetaminophen) .... Q3hr As Needed Per Dr Montez Morita 18)  Restore Wound Care Dressing  Pads (Wound Dressings) .... Apply To Wound Once A Week.  Allergies (verified): 1)  ! Vasotec  Past History:  PMH reviewed for relevance - last pap 2004  Review of Systems       per HPI  Physical Exam  General:  alert.  morbidly obese.  slow in responses but quite pleasant VS reviewed - mildy tachycardic.  Genitalia:  Normal introitus for age, no external lesions, no vaginal discharge, mucosa pink and moist, no vaginal or cervical lesions, no vaginal atrophy, no friaility or hemorrhage, normal uterus size and position, no adnexal masses or tenderness Skin:  on LLQ on pannus there is a superficial approx 4x4cm ulcer now with good granulation tissue on base.  nontender.  not oozing.  not foul smelling.    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  will not let sister change dressing at home.  will have her come in for 1x week dressing change with nurse visit - measure wound at each visit, clean and dry with sterile saline and cover with restore dressing.  Orders: [facility]- Est Level  3 (99213)  Problem # 2:  SCREENING FOR MALIGNANT NEOPLASM OF THE CERVIX (ICD-V76.2) Assessment: New  pap performed.  will send letter with results to patient once available.  Orders: [facility]- Est Level  3 (16109) Pap Smear-[facility] ([id number])  Complete Medication List: 1)  Poly Tan Dm Suspension  .Marland Kitchen.. 1 teaspoon q12hr as needed cough 2)  Triamterene-hctz 37.5-25 Mg Tabs (Triamterene-hctz) .... Daily 3)  Aspir-low 81 Mg Tbec (Aspirin) .... Daily 4)  Fluoxetine Hcl 40 Mg Caps (Fluoxetine hcl) .... Daily per [facility] 5)  Colace 100 Mg Caps (Docusate sodium) .... 2 tabs two times a day 6)   Wellbutrin Sr 150 Mg Xr12h-tab (Bupropion hcl) .... Two times a day per [facility] 7)  Omeprazole 20 Mg Cpdr (Omeprazole) .... Daily for reflux 8)  Benztropine Mesylate 1 Mg Tabs (Benztropine mesylate) .... Qhs per [facility] 9)  Lorazepam 0.5 Mg Tabs (Lorazepam) .... Qid prn agitation per [facility] 10)  Mirtazapine 15 Mg Tabs (Mirtazapine) .... By mouth at bedtime 11)  Risperidone 2 Mg Tabs (Risperidone) .... Qhs per [facility] 12)  Zonisamide 100 Mg Caps (Zonisamide) .... Qhs per [facility] 13)  Etodolac 400 Mg Tabs (Etodolac) .Marland Kitchen.. 1 po bid per dr Montez Morita (ortho) 14)  Hydrocodone-acetaminophen 10-650 Mg Tabs (Hydrocodone-acetaminophen) .... Q8hr per dr Montez Morita (ortho) 15)  Methocarbamol 500 Mg Tabs (Methocarbamol) .... Bid per dr Montez Morita (ortho) 16)  Fluticasone Propionate 0.05 % Crea (Fluticasone propionate) .... Two times a day prn 17)  Oxycodone-acetaminophen 5-500 Mg Caps (Oxycodone-acetaminophen) .... Q3hr as needed per dr Montez Morita 18)  Restore Wound Care Dressing Pads (Wound dressings) .... Apply to wound once a week.  Patient Instructions: 1)  Please schedule a nurse visit next Tuesday for dressing change.   2)  I will send you a letter with your pap test results.  If it is normal you won't need another one for 3 years!!! 3)  It was nice to see you today.  Prevention & Chronic Care Immunizations   Influenza vaccine: Not documented    Tetanus booster: 11/12/2001: Done.    Pneumococcal vaccine: Not documented  Other Screening   Pap smear: Done.  (04/15/2003)    Mammogram: Assessment: BIRADS 1. (approximate date was july 2010)  (11/12/2008)   Smoking status: never  (10/12/2009)  Lipids   Total Cholesterol: 116  (09/01/2009)   LDL: 56  (09/01/2009)   LDL Direct: Not documented   HDL: 48  (09/01/2009)   Triglycerides: 58  (09/01/2009)  Hypertension   Last Blood Pressure: 130 / 90  (10/12/2009)   Serum creatinine: 1.16  (09/01/2009)   Serum potassium  4.1  (09/01/2009)  Self-Management Support :   Personal Goals (by the next clinic visit) :      Personal blood pressure goal: 140/90  (08/02/2009)   Hypertension self-management support: Not documented    Hypertension self-management support not done because: Good outcomes  (09/01/2009)    Vital Signs:  Patient profile:   50 year old female Height:      64.5 inches Weight:      327.8 pounds BMI:     55.60 Temp:     99.4 degrees F Pulse rate:   100 / minute BP sitting:   130 / 90  (left arm)  Vitals Entered By: Theresia Lo RN (Oct 12, 2009 9:16 AM)

## 2010-06-14 NOTE — Assessment & Plan Note (Signed)
Summary: change dressing,df  Nurse Visit patient came into office today for dressing of Restore to be changed. sister states patient is very private and will not allow her to change dressing. there is no dressing on ulcer at this time . Dr. Benjamin Stain viewed ulcer and area appears to be no larger than on last visit. continues to be 4x5 cm. no drainage noted. area cleaned with sterile saline and dried well  Restore dressing applied. patient  has follow up with Dr. Sandi Mealy 10/12/2009. Theresia Lo RN  Oct 08, 2009 4:43 PM   Allergies: 1)  ! Vasotec  Orders Added: 1)  Est Level 1- [facility] [16109]

## 2010-06-14 NOTE — Miscellaneous (Signed)
Summary: [facility] VISIT/[facility]  patient here for pap but is on her menses.  will have her reschedule. Ancil Boozer  MD  Sep 23, 2009 11:00 AM   Allergies: 1)  ! Vasotec   Complete Medication List: 1)  Poly Tan Dm Suspension  .Marland Kitchen.. 1 teaspoon q12hr as needed cough 2)  Triamterene-hctz 37.5-25 Mg Tabs (Triamterene-hctz) .... Daily 3)  Aspir-low 81 Mg Tbec (Aspirin) .... Daily 4)  Fluoxetine Hcl 40 Mg Caps (Fluoxetine hcl) .... Daily per [facility] 5)  Colace 100 Mg Caps (Docusate sodium) .... 2 tabs two times a day 6)  Wellbutrin Sr 150 Mg Xr12h-tab (Bupropion hcl) .... Two times a day per [facility] 7)  Omeprazole 20 Mg Cpdr (Omeprazole) .... Daily for reflux 8)  Benztropine Mesylate 1 Mg Tabs (Benztropine mesylate) .... Qhs per [facility] 9)  Lorazepam 0.5 Mg Tabs (Lorazepam) .... Qid prn agitation per [facility] 10)  Mirtazapine 15 Mg Tabs (Mirtazapine) .... By mouth at bedtime 11)  Risperidone 2 Mg Tabs (Risperidone) .... Qhs per [facility] 12)  Zonisamide 100 Mg Caps (Zonisamide) .... Qhs per [facility] 13)  Etodolac 400 Mg Tabs (Etodolac) .Marland Kitchen.. 1 po bid per dr Montez Morita (ortho) 14)  Hydrocodone-acetaminophen 10-650 Mg Tabs (Hydrocodone-acetaminophen) .... Q8hr per dr Montez Morita (ortho) 15)  Methocarbamol 500 Mg Tabs (Methocarbamol) .... Bid per dr Montez Morita (ortho) 16)  Fluticasone Propionate 0.05 % Crea (Fluticasone propionate) .... Two times a day prn 17)  Oxycodone-acetaminophen 5-500 Mg Caps (Oxycodone-acetaminophen) .... Q3hr as needed per dr Montez Morita

## 2010-06-14 NOTE — Consult Note (Signed)
Summary: [facility]  [facility]   Imported By: Clydell Hakim 01/31/2010 16:13:18  _____________________________________________________________________  External Attachment:    Type:   Image     Comment:   External Document

## 2010-06-14 NOTE — Assessment & Plan Note (Signed)
Summary: change restore and wound check/ls  Nurse Visit  ulcer continues to improve. area is now approx 2 cm x 2 cm.  no drainage . ulcer cleaned with saline , dried well and restore applied. patient has appointment with Dr. Sandi Mealy scheduled for 11/18/2009 for recheck.  Theresia Lo RN  November 12, 2009 9:12 AM   Allergies: 1)  ! Vasotec  Orders Added: 1)  Est Level 1- [facility] [16109]

## 2010-06-14 NOTE — Assessment & Plan Note (Signed)
Summary: f/u   Vital Signs:  Patient profile:   50 year old female Height:      64.5 inches Weight:      331 pounds BMI:     56.14 BSA:     2.44 Temp:     98.9 degrees F Pulse rate:   88 / minute BP sitting:   142 / 88  Vitals Entered By: Jone Baseman CMA (September 01, 2009 9:02 AM)  Serial Vital Signs/Assessments:  Time      Position  BP       Pulse  Resp  Temp     By                     161/09                         Ancil Boozer  MD  CC: f/u HTN, blood work for meds Is Patient Diabetic? No Pain Assessment Patient in pain? no        CC:  f/u HTN and blood work for meds.  History of Present Illness: HTN: overall doing well.  taking meds as prescribed.  trying to watch her diet more closely in terms of sugar and salt. no headaches, no dizziness, no chest pains, no leg edema.  has noticed however some leg cramps primarily in R leg - relieved with heating pad.    blood work: due for monitoring blood work for her medications.  also due for pap at some point.   of note patient had surgery for removal of cyst from left scalp by dr Shon Hough approx 2 wks ago.   Habits & Providers  Alcohol-Tobacco-Diet     Tobacco Status: never  Current Medications (verified): 1)  Poly Tan Dm Suspension .Marland Kitchen.. 1 Teaspoon Q12hr As Needed Cough 2)  Triamterene-Hctz 37.5-25 Mg Tabs (Triamterene-Hctz) .... Daily 3)  Aspir-Low 81 Mg Tbec (Aspirin) .... Daily 4)  Fluoxetine Hcl 40 Mg Caps (Fluoxetine Hcl) .... Daily Per [facility] 5)  Colace 100 Mg Caps (Docusate Sodium) .... 2 Tabs Two Times A Day 6)  Wellbutrin Sr 150 Mg Xr12h-Tab (Bupropion Hcl) .... Two Times A Day Per [facility] 7)  Omeprazole 20 Mg Cpdr (Omeprazole) .... Daily For Reflux 8)  Benztropine Mesylate 1 Mg Tabs (Benztropine Mesylate) .... Qhs Per [facility] 9)  Lorazepam 0.5 Mg Tabs (Lorazepam) .... Qid Prn Agitation Per [facility] 10)  Mirtazapine 15 Mg Tabs (Mirtazapine) .... By Mouth At Bedtime 11)   Risperidone 2 Mg Tabs (Risperidone) .... Qhs Per [facility] 12)  Zonisamide 100 Mg Caps (Zonisamide) .... Qhs Per [facility] 13)  Etodolac 400 Mg Tabs (Etodolac) .Marland Kitchen.. 1 Po Bid Per Dr Montez Morita (Ortho) 14)  Hydrocodone-Acetaminophen 10-650 Mg Tabs (Hydrocodone-Acetaminophen) .... Q8hr Per Dr Montez Morita ([facility]tho) 15)  Methocarbamol 500 Mg Tabs (Methocarbamol) .... Bid Per Dr Montez Morita (Ortho) 16)  Fluticasone Propionate 0.05 % Crea (Fluticasone Propionate) .... Two Times A Day Prn 17)  Oxycodone-Acetaminophen 5-500 Mg Caps (Oxycodone-Acetaminophen) .... Q3hr As Needed Per Dr Montez Morita  Allergies (verified): 1)  ! Vasotec  Past History:  Past Surgical History: BTL - 09/01/2002, pe tubes - cyst/lipoma removal left scalp 07/2009  Review of Systems       per HPI.  denies abdominal pain.   Physical Exam  General:  alert.  morbidly obese.  slow in responses but quite pleasant Lungs:  Normal respiratory effort, chest expands symmetrically. Lungs are clear to auscultation, no  crackles or wheezes. Heart:  Normal rate and regular rhythm. S1 and S2 normal without gallop, murmur, click, rub or other extra sounds. Extremities:  No clubbing, cyanosis, edema, or deformity noted with normal full range of motion of all joints.     Impression & Recommendations:  Problem # 1:  ESSENTIAL HYPERTENSION, BENIGN (ICD-401.1) Assessment Unchanged at goal.  catch up on blood work today.   Her updated medication list for this problem includes:    Triamterene-hctz 37.5-25 Mg Tabs (Triamterene-hctz) .Marland Kitchen... Daily  Orders: Comp Met-[facility] [id number]) Lipid-[facility] [phone]) CBC w/Diff-[facility] ([id number]) [facility]- Est Level  3 (13086)  Problem # 2:  LEG CRAMPS (ICD-729.82) Assessment: New  check blood work to make sure lytes are okay.  otherwise continue heating pad as needed.  Orders: [facility]- Est Level  3 (57846)  Complete Medication List: 1)  Poly Tan Dm Suspension  .Marland Kitchen.. 1 teaspoon q12hr as needed cough 2)   Triamterene-hctz 37.5-25 Mg Tabs (Triamterene-hctz) .... Daily 3)  Aspir-low 81 Mg Tbec (Aspirin) .... Daily 4)  Fluoxetine Hcl 40 Mg Caps (Fluoxetine hcl) .... Daily per [facility] 5)  Colace 100 Mg Caps (Docusate sodium) .... 2 tabs two times a day 6)  Wellbutrin Sr 150 Mg Xr12h-tab (Bupropion hcl) .... Two times a day per [facility] 7)  Omeprazole 20 Mg Cpdr (Omeprazole) .... Daily for reflux 8)  Benztropine Mesylate 1 Mg Tabs (Benztropine mesylate) .... Qhs per [facility] 9)  Lorazepam 0.5 Mg Tabs (Lorazepam) .... Qid prn agitation per [facility] 10)  Mirtazapine 15 Mg Tabs (Mirtazapine) .... By mouth at bedtime 11)  Risperidone 2 Mg Tabs (Risperidone) .... Qhs per [facility] 12)  Zonisamide 100 Mg Caps (Zonisamide) .... Qhs per [facility] 13)  Etodolac 400 Mg Tabs (Etodolac) .Marland Kitchen.. 1 po bid per dr Montez Morita (ortho) 14)  Hydrocodone-acetaminophen 10-650 Mg Tabs (Hydrocodone-acetaminophen) .... Q8hr per dr Montez Morita (ortho) 15)  Methocarbamol 500 Mg Tabs (Methocarbamol) .... Bid per dr Montez Morita (ortho) 16)  Fluticasone Propionate 0.05 % Crea (Fluticasone propionate) .... Two times a day prn 17)  Oxycodone-acetaminophen 5-500 Mg Caps (Oxycodone-acetaminophen) .... Q3hr as needed per dr Montez Morita  Other Orders: TSH-[facility] [id number])  Patient Instructions: 1)  Please schedule an appointment for your pap smear. 2)  Your last mammogram was in July of 2010 so we are up to date on them.  3)  I will send you a letter with the results of your blood work. 4)  Continue to use the heating pad as needed for your leg cramps.  5)  It was nice as always to see you today.  Prevention & Chronic Care Immunizations   Influenza vaccine: Not documented    Tetanus booster: 11/12/2001: Done.    Pneumococcal vaccine: Not documented  Other Screening   Pap smear: Done.  (04/15/2003)    Mammogram: Done.  (04/15/2003)   Smoking status: never  (09/01/2009)  Lipids   Total Cholesterol: Not  documented   LDL: Not documented   LDL Direct: Not documented   HDL: Not documented   Triglycerides: Not documented  Hypertension   Last Blood Pressure: 142 / 88  (09/01/2009)   Serum creatinine: Not documented   Serum potassium Not documented CMP ordered     Hypertension flowsheet reviewed?: Yes   Progress toward BP goal: At goal  Self-Management Support :   Personal Goals (by the next clinic visit) :      Personal blood pressure goal: 140/90  (08/02/2009)   Hypertension self-management support: Not documented  Hypertension self-management support not done because: Good outcomes  (09/01/2009)

## 2010-07-29 LAB — COMPREHENSIVE METABOLIC PANEL
ALT: 14 U/L (ref 0–35)
AST: 28 U/L (ref 0–37)
Albumin: 4.1 g/dL (ref 3.5–5.2)
Alkaline Phosphatase: 193 U/L — ABNORMAL HIGH (ref 39–117)
BUN: 22 mg/dL (ref 6–23)
CO2: 28 mEq/L (ref 19–32)
Calcium: 9.6 mg/dL (ref 8.4–10.5)
Chloride: 102 mEq/L (ref 96–112)
Creatinine, Ser: 1.5 mg/dL — ABNORMAL HIGH (ref 0.4–1.2)
GFR calc Af Amer: 45 mL/min — ABNORMAL LOW (ref >60)
GFR calc non Af Amer: 37 mL/min — ABNORMAL LOW (ref >60)
Glucose, Bld: 90 mg/dL (ref 70–99)
Potassium: 3.8 mEq/L (ref 3.5–5.1)
Sodium: 141 mEq/L (ref 135–145)
Total Bilirubin: 0.7 mg/dL (ref 0.3–1.2)
Total Protein: 8.5 g/dL — ABNORMAL HIGH (ref 6.0–8.3)

## 2010-07-29 LAB — CBC
HCT: 36.7 % (ref 36.0–46.0)
Hemoglobin: 12 g/dL (ref 12.0–15.0)
MCH: 26.1 pg (ref 26.0–34.0)
MCHC: 32.7 g/dL (ref 30.0–36.0)
MCV: 79.8 fL (ref 78.0–100.0)
Platelets: 393 10*3/uL (ref 150–400)
RBC: 4.61 MIL/uL (ref 3.87–5.11)
RDW: 14.7 % (ref 11.5–15.5)
WBC: 9.5 10*3/uL (ref 4.0–10.5)

## 2010-07-29 LAB — POCT TOXICOLOGY PANEL
Benzodiazepines: POSITIVE
Opiates: POSITIVE

## 2010-07-29 LAB — ETHANOL: Alcohol, Ethyl (B): <10 mg/dL (ref 0–10)

## 2010-07-29 LAB — URINALYSIS, ROUTINE W REFLEX MICROSCOPIC
Glucose, UA: NEGATIVE mg/dL
Hgb urine dipstick: NEGATIVE
Ketones, ur: 15 mg/dL — AB
Nitrite: NEGATIVE
Protein, ur: NEGATIVE mg/dL
Specific Gravity, Urine: 1.018 (ref 1.005–1.030)
Urobilinogen, UA: 1 mg/dL (ref 0.0–1.0)
pH: 5 (ref 5.0–8.0)

## 2010-07-29 LAB — URINE MICROSCOPIC-ADD ON

## 2010-07-29 LAB — DIFFERENTIAL
Basophils Absolute: 0.1 10*3/uL (ref 0.0–0.1)
Basophils Relative: 1 % (ref 0–1)
Eosinophils Absolute: 0.2 10*3/uL (ref 0.0–0.7)
Eosinophils Relative: 2 % (ref 0–5)
Lymphocytes Relative: 34 % (ref 12–46)
Lymphs Abs: 3.2 10*3/uL (ref 0.7–4.0)
Monocytes Absolute: 0.6 10*3/uL (ref 0.1–1.0)
Monocytes Relative: 6 % (ref 3–12)
Neutro Abs: 5.4 10*3/uL (ref 1.7–7.7)
Neutrophils Relative %: 57 % (ref 43–77)

## 2010-08-03 LAB — POCT I-STAT, CHEM 8
BUN: 23 mg/dL (ref 6–23)
Calcium, Ion: 1.16 mmol/L (ref 1.12–1.32)
Chloride: 105 mEq/L (ref 96–112)
Creatinine, Ser: 1.7 mg/dL — ABNORMAL HIGH (ref 0.4–1.2)
Glucose, Bld: 103 mg/dL — ABNORMAL HIGH (ref 70–99)
HCT: 38 % (ref 36.0–46.0)
Hemoglobin: 12.9 g/dL (ref 12.0–15.0)
Potassium: 4 mEq/L (ref 3.5–5.1)
Sodium: 140 mEq/L (ref 135–145)
TCO2: 27 mmol/L (ref 0–100)

## 2010-09-01 ENCOUNTER — Ambulatory Visit (INDEPENDENT_AMBULATORY_CARE_PROVIDER_SITE_OTHER): Payer: Medicare Other | Admitting: Family Medicine

## 2010-09-01 DIAGNOSIS — R011 Cardiac murmur, unspecified: Secondary | ICD-10-CM | POA: Insufficient documentation

## 2010-09-01 DIAGNOSIS — K219 Gastro-esophageal reflux disease without esophagitis: Secondary | ICD-10-CM

## 2010-09-01 MED ORDER — OMEPRAZOLE 20 MG PO CPDR: 20.0000 mg | DELAYED_RELEASE_CAPSULE | Freq: Every day | ORAL | Status: DC

## 2010-09-01 NOTE — Patient Instructions (Signed)
It was a pleasure to care for you today.  Please check your labs in the morning before eating.  You may return in one month for complete adult physical or prn.

## 2010-09-01 NOTE — Progress Notes (Signed)
Subjective:     Lauren Wood is a 50 y.o. female and is here for medication refill. The patient reports no problems.  History   Social History  . Marital Status: Single    Spouse Name: N/A    Number of Children: N/A  . Years of Education: N/A   Occupational History  . Not on file.   Social History Main Topics  . Smoking status: Not on file  . Smokeless tobacco: Not on file  . Alcohol Use: Not on file  . Drug Use: Not on file  . Sexually Active: Not on file   Other Topics Concern  . Not on file   Social History Narrative  . No narrative on file   Health Maintenance  Topic Date Due  . Pap Smear  03/16/1978  . Colonoscopy  03/16/2010  . Influenza Vaccine  02/13/2011  . Tetanus/tdap  11/13/2011  . Mammogram  04/19/2012    The following portions of the patient's history were reviewed and updated as appropriate: allergies, current medications, past family history, past medical history, past social history, past surgical history and problem list.  Review of Systems Pertinent items are noted in HPI.   Objective:    BP 122/85  Pulse 137  Temp(Src) 98.9 F (37.2 C) (Oral)  Wt 327 lb 4.8 oz (148.462 kg) General appearance: alert and cooperative Lungs: clear to auscultation bilaterally Heart: regular rate and rhythm, S1, S2 normal and systolic murmur: systolic ejection 2/6, medium pitch at 2nd right intercostal space, radiates to carotids    Assessment:    Healthy female exam.  Chronic GERD Psychosis d/o NOS     Plan:    1. Chronic GERD on prilosec-check dexascan for osteoporosis.  If normal pt needs normal dexascan at 50 yo.  2. Psychosis d/o NOS on risperdal-check fasting CMP and lipid panel and CBC for screening for DM, CKD, and dyslipidemia 3. Heart murmur-check 2D echo ?aortic stenosis  See After Visit Summary for Counseling Recommendations

## 2010-09-08 ENCOUNTER — Ambulatory Visit (HOSPITAL_COMMUNITY)
Admission: RE | Admit: 2010-09-08 | Discharge: 2010-09-08 | Disposition: A | Discharge status: Home / Self Care | Payer: Medicare Other | Source: Ambulatory Visit | Attending: Family Medicine | Admitting: Family Medicine

## 2010-09-08 DIAGNOSIS — R011 Cardiac murmur, unspecified: Secondary | ICD-10-CM | POA: Insufficient documentation

## 2010-09-08 DIAGNOSIS — G4730 Sleep apnea, unspecified: Secondary | ICD-10-CM | POA: Insufficient documentation

## 2010-09-08 DIAGNOSIS — I10 Essential (primary) hypertension: Secondary | ICD-10-CM | POA: Insufficient documentation

## 2010-09-08 DIAGNOSIS — F79 Unspecified intellectual disabilities: Secondary | ICD-10-CM | POA: Insufficient documentation

## 2010-09-26 ENCOUNTER — Other Ambulatory Visit: Payer: Medicare Other

## 2010-09-26 LAB — LIPID PANEL
Cholesterol: 122 mg/dL (ref 0–200)
HDL: 41 mg/dL (ref >39)
LDL Cholesterol: 67 mg/dL (ref 0–99)
Total CHOL/HDL Ratio: 3 Ratio
Triglycerides: 71 mg/dL (ref <150)
VLDL: 14 mg/dL (ref 0–40)

## 2010-09-26 LAB — CBC
HCT: 37.3 % (ref 36.0–46.0)
Hemoglobin: 12.2 g/dL (ref 12.0–15.0)
MCH: 26.3 pg (ref 26.0–34.0)
MCHC: 32.7 g/dL (ref 30.0–36.0)
MCV: 80.4 fL (ref 78.0–100.0)
Platelets: 353 10*3/uL (ref 150–400)
RBC: 4.64 MIL/uL (ref 3.87–5.11)
RDW: 15.1 % (ref 11.5–15.5)
WBC: 9.5 10*3/uL (ref 4.0–10.5)

## 2010-09-26 NOTE — Progress Notes (Signed)
CMP,FLP AND CBC DONE TODAY Lauren Wood 

## 2010-09-27 LAB — COMPLETE METABOLIC PANEL WITH GFR
ALT: <8 U/L (ref 0–35)
AST: 12 U/L (ref 0–37)
Albumin: 4 g/dL (ref 3.5–5.2)
Alkaline Phosphatase: 145 U/L — ABNORMAL HIGH (ref 39–117)
BUN: 20 mg/dL (ref 6–23)
CO2: 26 mEq/L (ref 19–32)
Calcium: 9.6 mg/dL (ref 8.4–10.5)
Chloride: 103 mEq/L (ref 96–112)
Creat: 1.36 mg/dL — ABNORMAL HIGH (ref 0.40–1.20)
GFR, Est African American: 50 mL/min — ABNORMAL LOW (ref >60)
GFR, Est Non African American: 41 mL/min — ABNORMAL LOW (ref >60)
Glucose, Bld: 72 mg/dL (ref 70–99)
Potassium: 4.5 mEq/L (ref 3.5–5.3)
Sodium: 141 mEq/L (ref 135–145)
Total Bilirubin: 0.3 mg/dL (ref 0.3–1.2)
Total Protein: 7.1 g/dL (ref 6.0–8.3)

## 2010-09-27 NOTE — Assessment & Plan Note (Signed)
[facility]                             PULMONARY OFFICE NOTE   NAME:Lauren [name], Lauren [name]                      MRN:          [id number]  DATE:11/08/2006                            DOB:          23-Oct-1960    REASON FOR CONSULTATION:  The patient is a 50 year old female who I have  been asked to see for abnormal sleep.  The patient states that for 2/7  nights, she has been having nightmares.  The patient states that staff  where she stays has seen that she is stiff, and she felt like she  could not move and was having trouble breathing.  She would eventually  wake up and return to normal and then go back to sleep.  She has had  other episodes where she will awaken and have what sounds like sleep  paralysis, but not have difficulty breathing and not be associated with  a nightmare.  The patient states that she typically goes to bed between  8-10 p.m. and gets up at 6 a.m. to start her day.  Half the time she is  rested and half the time she is not.  She has been witnessed to have  snoring, according to her caregiver, and has had choking arousals.  The  patient states that she is sleepy during the day with periods of  inactivity and will dose.  She has no history of a seizure disorder.  Of  note, her weight is up about 30 pounds over the last year.   PAST MEDICAL HISTORY:  1. Hypertension.  2. History of congestive heart failure.  3. History of allergic rhinitis.  4. Status post tubal ligation.  5. Mental retardation.   CURRENT MEDICATIONS:  1. Diclofenac 75 mg b.i.d.  2. Risperdal 1 mg b.i.d.  3. Mirtazapine 15 mg nightly.  4. Zonisamide 100 mg nightly.  5. Prilosec 20 mg daily.  6. Triamterene/hydrochlorothiazide 37.5/25 daily.  7. Aspirin 81 mg daily.  8. Fluoxetine 40 mg daily.  9. Bupropion XL 300 mg daily.  10.Lorazepam 1 mg b.i.d. p.r.n.   ALLERGIES:  VASOTEC.   SOCIAL HISTORY:  She is single and has a caregiver/institution where she  lives.  She has never smoked.   FAMILY HISTORY:  Noncontributory.   REVIEW OF SYSTEMS:  As per history of present illness and in chart.   PHYSICAL EXAMINATION:  GENERAL:  She is a morbidly obese female in no  acute distress.  VITAL SIGNS:  Blood pressure 116/80, pulse 84, temperature 98.4, weight  337 pounds, O2 saturations on room air 96%.  HEENT:  Pupils equal round and reactive to light and accommodation,  extraocular movements intact.  Nares show deviated septum to the left.  Oropharynx shows elongation of soft palate, but a normal uvula.  NECK:  Neck is large and difficult to assess for JVD.  There is no  thyromegaly or lymphadenopathy.  CHEST:  Totally clear.  CARDIAC:  Regular rate and rhythm.  ABDOMEN:  Soft, nontender with good bowel sounds.  GENITALIA/RECTAL/BREASTS:  Not done and not indicated.  EXTREMITIES:  Lower extremities show 1+  edema.  Pulses are intact  distally.  NEUROLOGIC:  Alert and oriented with no motor deficits.   IMPRESSION:  Significantly disrupted sleep with nightmares, questionable  breathing difficulty and what sounds like some degree of sleep  paralysis.  It is really unclear to me whether the patient may be  exhibiting obstructive sleep apnea or could she possibly have some type  of nocturnal seizure disorder.  I would also wonder about REM behavior  disorder.  At this point in time, she is going to need an overnight  sleep study to help [place] with differential diagnosis.   RECOMMENDATIONS:  1. Will schedule NPSG with seizure montage as well as extra arm leads      to monitor for REM behavior disorder.  2. The patient will follow up after the above.     Barbaraann Share, MD,FCCP  Electronically Signed    [facility]  DD: 11/26/2006  DT: 11/27/2006  Job #: [id number]   cc:   Renaye Rakers, M.D.  Ezzard Flax

## 2010-09-27 NOTE — Procedures (Signed)
[name], [name]               ACCOUNT NO.:  [id number]   MEDICAL RECORD NO.:  [id number]          PATIENT TYPE:  OUT   LOCATION:  SLEEP CENTER                 FACILITY[facility]   PHYSICIAN:  Barbaraann Share, MD,FCCPDATE OF BIRTH:  Dec 07, 1960   DATE OF STUDY:  12/16/2006                            NOCTURNAL POLYSOMNOGRAM   REFERRING PHYSICIAN:   REFERRING PHYSICIAN:  Marcelyn Bruins MD   INDICATION FOR STUDY:  Hypersomnia with sleep apnea.   EPWORTH SLEEPINESS SCORE:  6.   SLEEP ARCHITECTURE:  The patient had a total sleep time of 290 minutes  with decreased slow wave sleep and very decreased REM.  Sleep onset  latency was normal at 23.5 minutes and REM onset was very prolonged at  222 minutes.  Sleep efficiency was decreased at 81%.   RESPIRATORY DATA:  The patient was found to have 149 obstructive  hypopnea's, 4 obstructive apnea's, and 19 central apnea's. This gave her  an apnea/hypopnea index of 36 events per hour.  The events were not  positional but there was loud snoring noted throughout.   OXYGEN DATA:  There was O2 desaturation as low as 64% during her  obstructive events during REM.   CARDIAC DATA:  Rare PVCs but no clinically significant arrhythmias were  noted.   MOVEMENT-PARASOMNIA:  Small numbers of leg jerks that were not  clinically significant.   IMPRESSIONS-RECOMMENDATIONS:  1. Moderate to severe obstructive sleep apnea with an apnea/hypopnea      index of 36 events per hour and O2 desaturation as low as 64%      during REM.  Treatment for this degree of sleep apnea should focus      primarily on weight loss as well as CPAP.  2. There was no evidence of seizure disorder or REM behavior disorder      on this sleep study.     Barbaraann Share, MD,FCCP  Diplomate, American Board of Sleep  Medicine  Electronically Signed    [facility]  D:  01/07/2007 12:44:24  T:  01/07/2007 21:55:02  Job:  [id number]

## 2010-09-30 NOTE — Discharge Summary (Signed)
[facility]  Patient:    JAMIA, HOBAN Visit Number: [id number] MRN: [id number]          Service Type: MED Location: [id number] Attending Physician:  Garnette Scheuermann Dictated by:   Lucille Passy, M.D. Admit Date:  09/30/2001 Disc. Date: 10/02/01   CC:         Solon Palm, M.D. at [facility]  Adelene Amas. Williford, M.D. at [facility]   Discharge Summary  DATE OF BIRTH:  May 12, 1961  SERVICE:  [facility] Service.  DISCHARGE DIAGNOSES: 1. Drug overdose. 2. Mental retardation. 3. Multiple psychiatric disorders including bipolar disorder, borderline    personality, depression, anxiety. 4. Hypertension.  DISCHARGE MEDICATIONS:  HCTZ 25 mg p.o. q.d.  CONSULTS:  Psychiatry - Dr. Adelene Amas. Williford.  BRIEF HOSPITAL COURSE:  See dictated History and Physical exam for more details.  This is a 50 year old African-American female with mental retardation who took 50 Effexor XR tablets at 75 mg each on the evening prior to admission, which was an intentional overdose possibly intended as a suicide.  The patient was asymptomatic on admission with stable vital signs.  #1 - DRUG OVERDOSE:  Admission EKG showed sinus tachycardia, no ischemia, no prolonged QT.  The patients vital signs on admission were stable as above. The rate on the EKG was 103 beats per minute.  The patient was asymptomatic on admission.  An admission CBC and basic metabolic panel were within normal limits.  Electrolytes stayed stable throughout admission.  Liver indicators were normal during hospitalization except for a mildly elevated alkaline phosphatase at 124.  The patient was admitted on telemetry and was in sinus rhythm throughout admission.  Tricyclic level on admission was negative, acetaminophen level on admission was low at 1.7, alcohol level was negative, salicylate negative, urine pregnancy  negative, urine drug screen on admission was negative.  The patient remained asymptomatic throughout admission and did show signs of prolonged QT or QRS on telemetry.  She was evaluated by Dr. Jeanie Sewer from psychiatry and will transferred to [facility] on the date of discharge.  #2 - MENTAL RETARDATION:  Not an active issue during hospitalization.  #3 - PSYCHIATRIC ISSUES INCLUDING BORDERLINE PERSONALITY, BIPOLAR DISORDER, DEPRESSION, ANXIETY:  The patient had been taking Effexor XR 75 mg p.o. t.i.d.; Risperdal 1 mg p.o. q.a.m., 3 mg p.o. q.p.m.; Depakote 75 mg q.a.m., 500 mg q.p.m.; Wellbutrin 200 mg p.o. b.i.d. as her psychiatric regimen prior to admission.  All these medicines were held and the patients psychotropic medications will continue to be deferred at discharge.  They may be restarted per [facility].  #4 - HYPERTENSION:  It is unclear whether the patient was taking HCTZ 25 mg p.o. q.d. prior to admission.  She was restarted on this dose of HCTZ during admission and blood pressure should be watched after discharge.  Her primary doctor may advise further blood pressure medicines.  DISPOSITION:  The patient to be transferred to [facility] for voluntary inpatient treatment with suicide precautions.  Dr. Jeanie Sewer felt that the patient was still at risk to harm herself.  He also felt that she had the capacity to consent for the psychiatric admission, which she did. Dictated by:   Lucille Passy, M.D. Attending Physician:  Garnette Scheuermann DD:  10/02/01 TD:  10/02/01 Job: [id number]

## 2010-09-30 NOTE — Discharge Summary (Signed)
NAME:  [name], [name]                         ACCOUNT NO.:  [id number]   MEDICAL RECORD NO.:  [id number]                   PATIENT TYPE:  INP   LOCATION:  3027                                 FACILITY:  [facility]   PHYSICIAN:  Deanna Artis. Sharene Skeans, M.D.           DATE OF BIRTH:  July 23, 1960   DATE OF ADMISSION:  08/17/2003  DATE OF DISCHARGE:  08/19/2003                                 DISCHARGE SUMMARY   DIAGNOSES AT DISCHARGE:  1. 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  2. Hypertension.  3. Morbid obesity.  4. Family history of stroke.  5. Hyperhomocysteinemia.  6. Mental retardation.  7. Gastroesophageal reflux disease.  8. Post-traumatic stress disorder.   MEDICATIONS AT DISCHARGE:  1. Protonix 40 mg q.d.  2. Wellbutrin 150 mg q.d.  3. Trilafon 2 mg b.i.d.  4. Hydrochlorothiazide 25 mg q.d.  5. Aspirin 325 mg q.d.  6. Remeron 15 mg q.h.s.  7. Topamax 50 mg b.i.d.   STUDIES:  1. CT of the brain on admission showed no acute abnormality.  2. CT angio shows no acute stroke, though could not inject with dye due to     extravasation during procedure. Dr. Stephens November, plastic surgeon,     notified, and will follow if needed. Site at discharge looks okay.  3. X-ray of the left humerus shows extravasation of large amount of nonionic     contrast material into the left upper extremity.  4. Carotid Doppler is normal.  5. Two-D echocardiogram has been performed. Results are pending at time of     discharge.  6. Transcranial Doppler completed with results pending at time of discharge.  7. EKG shows sinus tachycardia with __________ complexes, minimal voltage     criteria for LVH, may be normal variant, did have a run of quadrigeminy     in the hospital which resolved.   LABORATORY DATA:  Hemoglobin A1c 6.1. Homocysteine 16.64. Cholesterol 134,  triglycerides 74, HDL 42, LDL 77. Sodium 139, potassium 4.1, chloride 102,  CO2 27, BUN 11, creatinine 1.2, glucose 95,  calcium 9.5. Cardiac enzymes:  Troponin 0.01, CK 490, CK-MB 2.6. AST 30, ALT 15, alkaline phosphatase 158,  total bilirubin 0.6, total protein 8, albumin 3.7. UA negative. Urine  pregnancy test negative.   HISTORY OF PRESENT ILLNESS:  [name] is a 50 year old morbidly  obese woman who was a group home with history of mental retardation and post-  traumatic stress disorder who awoke around 5 a.m. the morning of admission  when she fell on her way to the bathroom. She was unable to get up. EMS was  called and assessed her. She complained of general weakness and nausea and  not responding like normal. She had no signs of trauma. She was brought to  [facility] for evaluation. She was seen by Dr. Sharene Skeans in the emergency  room and had incoordination of the left arm, retropulsion when  she stood.  She was transferred to [facility] for further stroke workup. She was not a  TPA or __________ candidate secondary to time.   HOSPITAL COURSE:  Was unable to perform MRI to evaluate for acute stroke  secondary to patient's body habitus and inability to fit into the scanner.  Attempted CT angio to evaluate vasculature, but the dye extravasated in her  arm, and CT was limited. Two-D echocardiogram and transcranial Dopplers were  also performed during the hospitalization, but results were pending at  discharge. With patient's improvement, it was thought she was safe to return  home and will follow up as an outpatient. The patient was not on aspirin  prior to stroke, and she was placed on that for secondary stroke prevention.  The patient did have elevated homocysteine, and Foltx was added. The patient  was counseled to stop smoking and attempt to loose weight.   DISCHARGE PLAN:  1. Discharge back to group home.  2. Aspirin for secondary stroke prevention.  3. New Foltx for elevated homocysteine.  4. Smoking cessation. Recommend classes through the heart center.  5. Follow up with family  practice within one month.  6. Follow up with Dr. Pearlean Brownie in two months.      Annie Main, N.P.                         Deanna Artis. Sharene Skeans, M.D.    SB/MEDQ  D:  08/19/2003  T:  08/20/2003  Job:  [id number]   cc:   [facility] Teaching Service   Pramod P. Pearlean Brownie, MD  Fax: [phone]

## 2010-09-30 NOTE — Op Note (Signed)
[facility]  Patient:    Lauren [name], Lauren [name]                      MRN: [id number] Proc. Date: 11/28/00 Adm. Date:  [id number] Attending:  Minette Headland                           Operative Report  PREOPERATIVE DIAGNOSIS:       Request for voluntary sterilization.  POSTOPERATIVE DIAGNOSIS:      Request for voluntary sterilization.  OPERATION:                    Laparoscopic tubal sterilization with bipolar                               fulguration and sharp division of fallopian                               tubes.  SURGEON:                      Freddy Finner, M.D.  ANESTHESIA:                   General.  INTRAOPERATIVE COMPLICATIONS: None.  ESTIMATED BLOOD LOSS:         Less than or equal to 50 cc.  INDICATIONS:                  The patient is a 50 year old with significant mental retardation and depression requiring potent antipsychotic drugs.  Her caregivers have noticed a recent establishment of sexual activity and have grave concerns about the potential for pregnancy in this particular person. The patient seems to be completely aware of the situation and has requested sterilization and concurs with those plans.  She and her counselor have reviewed a video in the office describing the procedure.  She is admitted now for sterilization.  DESCRIPTION OF PROCEDURE:     She was admitted on the morning of surgery and brought to the operating room and placed under adequate general anesthesia and placed in the dorsal lithotomy position using the [facility] stirrup system. Betadine prep of abdomen, perineum and vagina was carried out, bladder was evacuated with a Robinson catheter.  Hulka tenaculum was attached to the cervix without difficulty.  Sterile drapes were applied.  Infraumbilical skin incision was made with great care, because of the patients massive obesity a 12 mm long disposable trocar was introduced into the abdominal cavity  without evidence of injury on entry.  This required three attempts of progressively passing through the abdominal wall.  Pneumoperitoneum was allowed to accumulate with carbon dioxide gas.  The patient was placed in Trendelenburg position.  The uterus itself appeared to be normal.  The proximal fallopian tubes could be seen, no other structures could be adequately evaluated because of the obesity and the use of a single puncture.  The isthmic portion of each fallopian tube was fulgurated with bipolar forceps.  Initial plan was to use Filshie clips, but the obesity precluded reasonable application of these given the ease of performing the ___ procedure.  The tube was coagulated in two different locations, at the isthmus and then divided through the fulgurated segment.  Inspection under reduced intra-abdominal pneumoperitoneal pressure reviewed adequate hemostasis.  The procedure was terminated and instruments removed.  Incision was infused locally with 0.25% plain Marcaine, 3-0 Dexon suture was used to close the skin in subcuticular fashion.  The patient was awaken and taken to the recovery room in good condition.  She was given Vicodin to be taken as needed for postoperative pain.  She is return to the office in seven to 10 days for postoperative follow up. She is to have routine outpatient surgical instructions. DD:  11/28/00 TD:  11/28/00 Job: [id number]

## 2010-09-30 NOTE — Discharge Summary (Signed)
[facility]  Patient:    Lauren [name], Lauren [name] Visit Number: [id number] MRN: [id number]          Service Type: PSY Location: [phone] Attending Physician:  Rachael Fee Dictated by:   Reymundo Poll Dub Mikes, M.D. Admit Date:  10/02/2001 Discharge Date: 10/08/2001                             Discharge Summary  CHIEF COMPLAINT AND PRESENT ILLNESS:  This is one of several admissions to [facility] for this 50 year old female voluntarily admitted after an overdose of Effexor.  History of intentional overdose, taking 50 Effexor and locked herself in her room.  She is in independent living arrangement with 24-hour care.  Staff had found out that the medication was missing.  She was transported to the emergency department.  Denied any specific for the overdose.  Denied any active suicidal or homicidal ideation upon admission.  PAST PSYCHIATRIC HISTORY:  Been seen at the mental health center.  History of suicide attempts in the past.  History of self-mutilation, cutting herself with scissors and swallowing quarters.  Required one-to-one monitoring in her last hospitalization at [facility].  ALCOHOL/DRUG HISTORY:  Denies the use or abuse of any substances.  MEDICAL HISTORY:  Hypertension.  MEDICATIONS:  Effexor XR 25 mg three times a day, Depakote 750 mg in the morning and 500 mg at night, Risperdal 1 mg in the morning and 3 mg at bedtime, Wellbutrin 200 mg twice a day.  MENTAL STATUS EXAMINATION:  Alert, morbidly obese, African-American female with little eye contact.  Cooperative.  Speech is slow response ______________ .  Mood is depressed.  Affect is flat.  Thought processes are coherent.  No evidence of psychosis.  No auditory or visual hallucinations. No suicidal or homicidal ideation.  ADMISSION DIAGNOSES: Axis I:    Bipolar disorder. Axis II:   Mental retardation. Axis III:  1. Obesity.            2. Hypertension. Axis IV:    Moderate. Axis V:    Global Assessment of Functioning upon admission 25; highest Global            Assessment of Functioning in the last year 60.  HOSPITAL COURSE:  She was admitted and started intensive individual and group psychotherapy.  Worked towards stabilizing, worked on Counsellor.  Still no specific triggers for the overdose.  She started feeling better.  She was wanting to go back to the group home and they were willing to accept her back.  On Oct 08, 2001, she was much improved.  No suicidal ideation.  No homicidal ideation.  Expressed great regret for what happened. Was wanting to be discharged and go back to the group home.  Group home was agreeable to take her back for which discharge was considered and granted.  DISCHARGE DIAGNOSES: Axis I:    Bipolar disorder. Axis II:   Mental retardation. Axis III:  1. Hypertension.            2. Obesity. Axis IV:   Moderate. Axis V:    Global Assessment of Functioning upon discharge 50.  DISCHARGE MEDICATIONS: 1. Hydrochlorothiazide 25 mg daily. 2. Wellbutrin SR 100 mg twice a day. 3. Clonidine 0.1 mg three times a day. 4. Depakote ER 500 mg at bedtime. 5. Risperdal 0.5 mg three times a day. 6. Topamax 25 mg, 2 three times  a day.  FOLLOW-UP:  [facility]. Dictated by:   Reymundo Poll Dub Mikes, M.D. Attending Physician:  Rachael Fee DD:  11/20/01 TD:  11/22/01 Job: [id number]

## 2010-09-30 NOTE — Consult Note (Signed)
NAME:  Lauren [name], Lauren [name]                         ACCOUNT NO.:  [id number]   MEDICAL RECORD NO.:  [id number]                   PATIENT TYPE:  INP   LOCATION:  3027                                 FACILITY:  [facility]   PHYSICIAN:  Billey Gosling, M.D.                 DATE OF BIRTH:  1960/11/04   DATE OF CONSULTATION:  08/18/2003  DATE OF DISCHARGE:                                   CONSULTATION   REQUESTING PHYSICIAN:  Pramod P. Pearlean Brownie, MD.   SUBJECTIVE:  Ms. Tipping is a 50 year old African American female with  extensive psychiatric history who presented to the emergency room yesterday  with complaints of incoordination and ataxia which she noticed when getting  out of bed to go to the restroom.  We were asked to see the patient and to  follow along with the stroke service while she is undergoing a work-up for  probable cerebellar stroke.   REVIEW OF SYMPTOMS:  Negative for fever, chills, chest pain or shortness of  breath, abdominal pain, seizures, weakness, vision changes, speech deficits,  bruising or dysuria.   PAST MEDICAL HISTORY:  1. Morbid obesity.  2. Depression.  3. Anxiety.  4. Borderline personality.  5. Mental retardation.  6. Hypertension.  7. Insomnia.  8. Post traumatic stress disorder.  9. Atopic dermatitis.   MEDICATIONS:  1. Hydrochlorothiazide 25 mg daily.  2. Protonix 40 mg daily.  3. Topamax 25 mg two tablets b.i.d.  4. Wellbutrin XL 150 mg daily.  5. Perphenazine 2 mg b.i.d.  6. Remeron 16 mg q.h.s.   ALLERGIES:  VASOTEC.   FAMILY HISTORY:  Significant for hypertension and diabetes.   SOCIAL HISTORY:  The patient lives in [facility] and has 24-hour  supervision there.  She denies tobacco or alcohol use.   PHYSICAL EXAMINATION:  GENERAL APPEARANCE:  This is a morbidly obese African  American female in no acute distress.  She has a flattened affect but is  alert and oriented x3.  VITAL SIGNS:  Temperature 97.6, pulse 107, respiratory rate 28, blood  pressure 112/78, oxygen saturation 98%.  HEENT:  Normocephalic and atraumatic.  Lymphoma present on left forehead.  Pupils are equal, round and reactive to light.  Extraocular movements  intact.  Oropharynx clear.  Moist mucous membranes.  NECK:  Supple.  No lymphadenopathy.  No carotid bruits.  LUNGS:  Clear to auscultation bilaterally with some upper airway congestion  audible.  Good respiratory effort.  CARDIOVASCULAR:  Regular rate and rhythm without murmurs, rubs, or gallops.  ABDOMEN:  Obese, soft, nontender and nondistended with normal active bowel  sounds.  EXTREMITIES:  Trace lower extremity edema bilaterally.  There are 2+ pedal  pulses bilaterally.  NEUROLOGIC:  Cranial nerves II-XII grossly intact.  Sensation intact  bilaterally.  There is 5/5 strength bilaterally.  Finger-to-nose test within  normal limits.  Equivocal rapid alternating movements.  Gait not observed  secondary  to patient's weight (will ask for physical therapy's assistance).   LABORATORY DATA:  Sodium 140, potassium 3.3, chloride 107, bicarb 26, BUN  15, creatinine 1.2, glucose 101.  LFTs within normal limits.  [place] blood  cell count 10.0, hemoglobin 13.2, hematocrit 40.2, platelets 353.  Total CK  490, CK-MB 2.6, troponin I 0.01.  Urine pregnancy negative.  Urinalysis  within normal limits.   CT of the head showed no acute abnormalities.   ASSESSMENT/PLAN:  A 50 year old [place] American female with extensive  psychiatric history who presented with ataxic gait.   1. Neurologic:  Appreciate stroke service taking care of this patient and     the work-up is under way to assess for probable cerebellar stroke.  The     patient will have an MRI/MRA today if patient's habitus allows.  Carotid     Dopplers are pending.  Continue aspirin and check a fasting lipid panel     for secondary prevention.  Physical therapy has been consulted for     assistance with ambulation.  2. Hypokalemia:  Secondary to  hydrochlorothiazide, replete potassium today     and check a basic metabolic panel in the morning.  3. Hypertension:  Well controlled with hydrochlorothiazide.  4. Psychiatry:  Stable, continue medical regimen as instructed by mental     health.                                               Billey Gosling, M.D.    AS/MEDQ  D:  08/18/2003  T:  08/19/2003  Job:  [id number]   cc:   Pramod P. Pearlean Brownie, MD  Fax: [phone]   Thomos Lemons. Lazarus Salines, M.D.  Fam. Med - Resident - [place], [place] [address]  Fax: [phone]

## 2010-09-30 NOTE — Discharge Summary (Signed)
[facility]  Patient:    Lauren [name], Lauren [name]                        MRN: [id number] Adm. Date:  [id number] Disc. Date: [id number] Attending:  Astrid Divine CC:         [facility]   Discharge Summary  REASON FOR ADMISSION:         A 50 year old black female who was admitted after breaking into her drug cabinet at her group home and taking some pills. She probably took 900 mg of Seroquel.  May have taken some of her other regular medications, although not clear.  She was treated with activated charcoal in the ER.  PHYSICAL EXAMINATION:  GENERAL:                      An obese black female alert and oriented x 3.  VITAL SIGNS:                  Blood pressure 122/65.  Heart rate 114. Respirations 20.  Temperature 99.1.  Oxygen saturation 94% on room air.  ABDOMEN:                      Mild diffuse tenderness without rebound or guarding.  NEUROLOGIC:                   Nonfocal.  Mildly lethargic.  LABORATORIES:                 CBC:  Hemoglobin 11.8, platelet count 256, WBC 10.5.  Sodium 141, potassium 3.1, chloride 105, bicarbonate 29, BUN 13, creatinine 1.2.  Liver function tests normal.  Acetaminophen level less than 10.  ______ level less than 4.0.  Urine drug screen was negative.  Valproic acid 50.9.  PT/PTT 13.6 and 30.                                EKG shows sinus tachycardia, left atrial enlargement, left ventricular hypertrophy.  HOSPITAL COURSE:              # 1 - OVERDOSE:  The patient was admitted for an overdose.  She likely took about 900 mg of Seroquel.  She did evidence some mild CNS depression and sinus tachycardia at admission.  There was no change in either or Q or QT interval.  In the 24 hours she was admitted she did well with clearing in her mental status.  By the morning of her discharge she was alert and oriented.  She evidenced no arrhythmias.  Her heart rate was in the 90s at discharge.  Her outpatient medications  were restarted.  She did have some mild hypokalemia which was repleted and potassium at discharge was 3.8. She was seen by Dr. ______ of the psychiatry service who felt that she was not suicidal.  Given the chronicity of her psychiatric problems, he did not recommend hospitalizations.  He recommended discharge back to her group home as her medical condition allowed.  DISCHARGE DIAGNOSES:          1. Seroquel overdose.                               2. Hypokalemia.  PROCEDURE:  None.  DISCHARGE MEDICATIONS:        1. Prozac 60 mg q.d.                               2. Depakote 250 mg one in the morning and 500 mg                                  one in the evening.                               3. Risperdal 3 mg b.i.d.                               4. Hydrochlorothiazide 25 mg once a day.                               5. Seroquel 150 mg q.h.s.                               6. Wellbutrin 150 mg q.a.m. and q. afternoon.                               7. Potassium chloride 20 mEq q.d.  DIET:                         Regular.  ACTIVITY:                     As tolerated.  FOLLOW-UP:                    Within one to two weeks with Dr. ______ at [facility].  Her home manager, Burman Foster, was contacted prior to discharge. DD:  02/20/00 TD:  02/20/00 Job: [id number]

## 2010-09-30 NOTE — H&P (Signed)
[place]. [facility]  Patient:    Lauren [name], Lauren [name]                      MRN: [id number] Adm. Date:  [id number] Attending:  Sanjuana Letters Dictator:   Gwenlyn Perking, M.D. CC:         Talmage Nap, M.D.  [facility]   History and Physical  PRIMARY CARE PHYSICIAN:  Talmage Nap, M.D.  PROBLEM LIST:  1. Acetaminophen overdose.  2. Urine drug screen positive benzodiazepines with possible coingestion of     this substance.  3. History of morbid obesity.  4. History of major depression.  5. History of anxiety.  6. History of borderline personality disorder.  7. History of hypertension.  8. History of posttraumatic stress disorder.  9. History of bipolar disorder. 10. History of multiple admissions for drug overdoses in the past.  CHIEF COMPLAINT:  Tylenol overdose.  HISTORY OF PRESENT ILLNESS:  Patient is a 50 year old African-American female patient with a past medical history of mental retardation, bipolar disorder, borderline personality disorder, morbid obesity, and hypertension who presented to the emergency department status post overdose of PMS formula which is a compound consisting of acetaminophen 500 mg per pill, pamabrom 25 mg per pill which is a diuretic, and pyrilamine maleate 15 mg per pill. Patient apparently ingested a whole bottle of 40 pills on September 04, 2000 at 2130 hours.  Of note is that the patient has had multiple admissions in the past for overdose.  Patient also has a history of multiple involuntary commitments for suicidal gestures.  Patient currently lives at a group home with a 24 hour supervision.  Patient obtained pills on the afternoon before the admission at a store.  Apparently, she is currently menstruating and therefore she bought the PMS formula for symptomatic relief.  Later on that evening, decided that she wanted to hurt herself according to the patient.  REVIEW OF SYSTEMS:  GENERAL:   No fevers or chills.  CARDIOVASCULAR:  No chest pain.  RESPIRATORY:  No shortness of breath.  Otherwise the review of systems is limited as the patient is not very cooperative in giving me a history due to her being sleepy.  MEDICATIONS:  Depakote, Risperdal, Wellbutrin.  The patient also appears to be taking Diflucan currently.  Several medicines are listed on her medical record from clinic which include hydrochlorothiazide, ibuprofen, Prozac, Seroquel which the patient is apparently not taking right now.  ALLERGIES:  VASOTEC.  PAST SURGICAL HISTORY:  Cholecystectomy.  SOCIAL HISTORY:  Patient lives in [facility]; has her own apartment, but 24 hour supervision.  No smoking, alcohol, or drugs.  Works part-time at [employer] ______.  FAMILY HISTORY:  Hypertension-mother, brother, sister; diabetes mellitus-mother and sister.  PHYSICAL EXAMINATION  VITAL SIGNS:  Stable, afebrile.  GENERAL:  Morbidly obese 50 year old African-American female sleepy, in no apparent distress.  Patient is alert and oriented x 3.  HEENT:  Normocephalic, atraumatic.  Pupils are equal, round and reactive to light.  Extraocular muscles are intact.  Ears, nose, and throat:  Grossly within normal limits.  NECK:  No lymphadenopathy.  CARDIOVASCULAR:  Regular rate and rhythm without murmurs.  Distant heart tones.  RESPIRATORY:  Clear to auscultation bilaterally anteriorly.  ABDOMEN:  Protuberant, but soft with normoactive bowel sounds.  The spleen and liver size are difficult to palpate/percuss secondary to morbid obesity.  EXTREMITIES:  No cyanosis, clubbing.  Trace edema bilaterally to the levels of  the knees in the lower extremities.  NEUROLOGIC:  Cranial nerves 2-12 grossly intact.  No focal sensory or motor deficits.  LABORATORIES:  A 2 hour post ingestion Tylenol level was 153.  Alcohol less than 10.  Salicylate less than 4.  AST/ALT 32 and 11 respectively.  Total bilirubin 0.3.  Knechtel blood  cell count 11.3, H&H 12.5 and 37.0, platelets 250,000.  Sodium 138, potassium 3.5, chloride 106, CO2 29, BUN/creatinine 16/1.0, glucose 93.  UA within normal limits.  Urine pregnancy is negative. Urine drug screen is positive for benzodiazepines.  ASSESSMENT:  A 50 year old African-American female patient with multiple mental problems including past history of overdose, bipolar disorder, borderline personality disorder, PTSD, mental retardation with a history of Tylenol ingestion about an hour prior to presentation to the emergency department which was according to the patient an intentional ingestion. Patient also found to have positive benzodiazepine screen.  PLAN: 1. Admit patient to ICU for observation, attending Dr. Leveda Anna. 2. Mucomyst loading dose of 140 mg/kg followed by 70 mg/kg q.4h. x 17. 3. Will obtain serial acetaminophen level at 4, 8, and 12 hours post ingestion    to follow the Rumack-Matthew nomogram for prognostic purposes. 4. Will draw an AST, ALT, PT, and total bilirubin level on day #3 to check for    delayed hepatotoxic effect of Tylenol. 5. Carefully observe for respiratory depression for possible benzodiazepine    coingestion. 6. Will call behavioral medicine team in the morning for possible transfer    either tomorrow or the day after tomorrow for commitment if necessary if    patient is not agreeable to voluntary commitment. DD:  09/05/00 TD:  09/05/00 Job: [id number]

## 2010-09-30 NOTE — Discharge Summary (Signed)
[facility]  Patient:    Lauren [name], Lauren [name]                      MRN: [id number] Adm. Date:  [id number] Disc. Date: [id number] Attending:  Cathren Laine                           Discharge Summary  BRIEF HISTORY:  Ms. Heagle is a 50 year old single black female admitted under commitment after increasing psychosis and cutting her arm.  The patient has a long history of psychotic illness and has been followed through the mental health center.  She was petitioned on by Dr. Leilani Merl at the [facility] ED after the patient presented to the emergency department after having cut her arm with broken glass; she required eight sutures.  She apparently had been hearing voices which told her to harm herself.  She apparently had also been eating quarters and these showed up on her abdominal x-ray.  She apparently had been noncompliant with her medication and had become increasingly irritable with poor impulse control.  She reported sleeping and eating fairly well.  Patient is followed through the [facility] by Dr. Gwyndolyn Kaufman.  She has a history of six hospitalizations within five months last year.  [place]. She is followed medically through the [facility].  She has a history of hypertension.  At the time of admission, she was apparently on Prozac 60 mg q.a.m., Risperdal 3 mg b.i.d., Depakote 500 mg q.a.m. and 1000 mg q.h.s., Seroquel 50 mg q.h.s. and Wellbutrin 150 mg b.i.d.  DRUG ALLERGIES:  She had no known drug allergies.  PHYSICAL EXAMINATION:  Physical exam was performed in the [facility] Emergency Department with no significant findings other than her obesity, the laceration to her left forearm and the fact that she had swallowed 10 quarters which were in her intestinal tract.  MENTAL STATUS EXAMINATION:  Mental status exam on admission revealed an obese African-American female who was rather  disheveled.  Speech was soft and decreased in quantity.  Thought processes showed auditory hallucinations with command hallucinations to harm herself; she also reported speaking to God. She had suicidal ideation as noted.  Mood was anxious and depressed and affect was flat.  Oriented x 3.  Cognition was intact but she is of limited intellectual functioning.  ADMITTING DIAGNOSES Axes I:    1. Psychotic disorder, not otherwise specified.            2. Mood disorder by history. Axis II:   Mild mental retardation. Axis III:  Hypertension. Axis IV:   Psychosocial stressors, severe. Axis V:    Global assessment of functioning currently is 15; highest the past            year is 50.  LABORATORY DATA:  Laboratory studies show normal CBC.  Blood chemistries were normal.  Thyroid panel was normal.  Urine pregnancy test was negative. Valproic acid level by the time of discharge was 55.6.  Urinalysis was normal.  HOSPITAL COURSE:  Patient was admitted to the [facility] for treatment of her psychosis, mood disorder and to prevent any suicidal acting out.  She was initially placed on a combination of Risperdal, Depakote and p.r.n. Seroquel.  Patient continued to have auditory hallucinations with thoughts of harming herself.  She had to be placed on one-to-one observation. She continued to hear  voices and could not contract for safety.  We discussed treatment options with both the patient and her mother; we elected to try her on Geodon.  We gradually increased the Geodon and decreased her Risperdal. The patient gradually showed some improvement with the Geodon and was able to come off of one-to-one.  Mood and affect continued to improve and she reported that her auditory hallucinations had gone away.  She was no longer feeling suicidal and felt she could be safe outside the hospital.  Patients laceration to her left forearm healed well and plans were to remove her sutures prior to  discharge.  CONDITION ON DISCHARGE:  Patient is discharged in improved condition with improvement in her mood, sleep and appetite, alleviation of her auditory hallucinations and alleviation of any thoughts of harming herself.  DISPOSITION:  Patient is discharged back to her group home.  She is to follow up at the [facility].  DISCHARGE MEDICATIONS 1. Depakote 500 mg ER q.a.m. and 1000 mg ER q.h.s. 2. Risperdal 1 mg q.a.m. and 2 mg q.h.s. 3. Geodon 20 mg q.a.m. and 40 mg q.h.s. 4. Celebrex 100 mg b.i.d. 5. Hydrochlorothiazide 25 mg q.d. 6. Colace 100 mg b.i.d. 7. Nizoral 2% cream, apply to areas under her arm q.h.s.  DIET:  She was to be on a low-salt diet.  FINAL DIAGNOSES Axis I:    Schizoaffective disorder. Axis II:   Mild mental retardation. Axis III:  Hypertension. Axis IV:   Psychosocial stressors, severe. Axis V:    Global assessment of functioning currently is 50; highest the past            year is 50. DD:  06/26/00 TD:  06/26/00 Job: [id number]

## 2010-09-30 NOTE — H&P (Signed)
[place]. [facility]  Patient:    NANCI, LAKATOS Visit Number: [id number] MRN: [id number]          Service Type: MED Location: [phone] Attending Physician:  Garnette Scheuermann Dictated by:   Kevin Fenton, M.D. Admit Date:  09/30/2001                           History and Physical  SERVICE:  Family Practice.  ATTENDING:  Sibyl Parr. Fields, M.D.  RESIDENT:  Kevin Fenton, M.D.  CHIEF COMPLAINT:  Effexor overdose.  HISTORY OF PRESENT ILLNESS:  This is a 50 year old African-American female with mental retardation who took 50 Effexor XR tablets at 75 mg each last night sometime between 10 p.m. and 1 a.m.  She told her caregiver this morning.  The patient admits that this was an intentional overdose intended as a suicide.  The patient denies any pain or shortness of breath.  REVIEW OF SYSTEMS:  Negative except as above.  She refuses to discuss any recent stressors.  She does have a history of suicide attempts in the past, the last one in May 2002 which was a Tylenol overdose.  PAST MEDICAL HISTORY:  Positive for mental retardation, profound obesity, depression, anxiety, borderline personality, and hypertension.  MEDICATIONS: 1. Effexor XR 75 mg t.i.d. 2. Risperdal 1 mg in the morning and 3 mg in the evening. 3. Depakote 750 mg in the morning and 500 mg at night. 4. Wellbutrin 200 mg b.i.d. 5. Ibuprofen as needed. 6. Caltrate + D 600 mg a day. 7. Multivitamin.  ALLERGIES:  VASOTEC.  SOCIAL HISTORY:  She is treated at mental health by Dr. Janett Billow and Margurite Auerbach, his P.A.  They prescribe her psych medications.  She [facility] at  [facility].  No tobacco, no alcohol.  FAMILY HISTORY:  Noncontributory.  Her caregiver today is Vira Blanco.  PHYSICAL EXAMINATION:  VITAL SIGNS:  Heart rate 100, blood pressure 153/98, respiratory rate 29, SAO2 100% on room air.  GENERAL:  She is defiant, alert, keeps asking to go home, morbidly  obese.  HEENT:  Normocephalic.  Has a 2 x 2 mass mobile on anterior scalp; it is nontender.  Mucous membranes are moist.  EOMs are intact.  CHEST:  Lungs are clear.  Heart rate is regular, slightly tachycardic, no murmurs.  ABDOMEN:  Soft and obese.  EXTREMITIES:  No edema and warm.  NEUROLOGIC:  She is oriented to person and place, noncooperative, restrained. Moves all extremities.  Difficult exam.  LABORATORY DATA:  CBC within normal limits.  BMET within normal limits.  UDS and Tylenol levels are pending.  EKG shows sinus tachycardia, no ischemia, no prolonged QT.  ASSESSMENT AND PLAN:  A 50 year old African-American female with mental retardation, bipolar disorder, borderline personality, who now presents with an intentional overdose of 3750 mg of Effexor XR.  I spoke with Poison Control.  Seizures have been seen in doses greater than 1400 mg.  We will monitor her for CNS depression or agitation, hyper and hypotension, prolonged QRS/QT, and ventricular arrhythmias and we will give her 1 g/kg of charcoal, a telemetry bed, a baby-sitter.  We will restrain her for safety.  We will hold all of her medications and will do frequent neuro checks.  Refer to mental health when mentally clear.  This patient has been discussed with Dr. Darrick Penna. Dictated by:   Kevin Fenton, M.D. Attending Physician:  Garnette Scheuermann DD:  09/30/01 TD:  09/30/01 Job: [id number]

## 2010-09-30 NOTE — H&P (Signed)
[facility]  Patient:    Lauren [name], Lauren [name] Visit Number: [id number] MRN: [id number]          Service Type: PSY Location: [phone] Attending Physician:  Rachael Fee Dictated by:   Candi Leash. Orsini, N.P. Admit Date:  10/02/2001                     Psychiatric Admission Assessment  IDENTIFYING INFORMATION:  This is a 50 year old single African-American female that was voluntarily admitted after an overdose of Effexor.  HISTORY OF PRESENT ILLNESS:  The patient presents with a history of intentional overdose, taking 50 Effexor tablets, on Sep 30, 2001, and had locked herself in her room.  The patient stays in an independent living arrangement with 24-hour care.  Staff had found out that the medication was missing.  The patient was transported to the emergency department for evaluation.  The patient denies any specific triggers to the overdose.  She took the pills approximately 12 hours prior to her emergency department visit. She is currently denying any suicidal or homicidal ideation or any psychotic symptoms.  The patient reports sleep and appetite has been satisfactory.  PAST PSYCHIATRIC HISTORY:  Sees Dr. ________ at [facility].  Lauren [name], nurse practitioner, and Lauren [name], who is her therapist.  She has a history of suicide attempts in the past.  She has a history of self-mutilation, cutting herself with scissors and swallowing quarters.  The patient required one-to-one monitoring with her last admission to [facility].  This is her second hospitalization, last was in April of 2002.  SOCIAL HISTORY:  This is a 50 year old single African-American female with no children.  She lives at the _______ [facility], independent living center in [place].  Unsure about her educational level.  No legal problems.  Mother is in a nursing home.  She has a brother and sister who are supportive.  FAMILY HISTORY:   Unknown.  ALCOHOL/DRUG HISTORY:  She is a nonsmoker.  Denies any alcohol or substance abuse.  PRIMARY CARE Lauren [name]:  Unknown.  MEDICAL PROBLEMS:  Hypertension.  MEDICATIONS:  Effexor XR 75 mg t.i.d., Depakote 750 mg q.a.m., 500 mg q.h.s., Risperdal 1 mg q.a.m. and 3 q.h.s., Wellbutrin SR 200 mg b.i.d., Cal-Citrate 600 mg plus D 1 q.a.m.  DRUG ALLERGIES:  VASOTEC.  PHYSICAL EXAMINATION:  Performed at [facility] while patient was hospitalized with her overdose.  LABORATORY DATA:  She has normal EKG.  Urine drug screen is negative.  CMET shows alkaline phosphatase elevated at 124.  Urine pregnancy test is negative. CBC was within normal limits.  Acetaminophen level was 1.7.  Alcohol level is less than 5.  MENTAL STATUS EXAMINATION:  She is an alert, morbidly obese, African-American female with little eye contact.  She is cooperative.  Speech is with slow responses, concrete, little spontaneity.  Mood is depressed.  Affect is flat. Thought processes are coherent.  There is no evidence of psychosis.  No auditory or visual hallucinations.  No suicidal or homicidal ideation. Cognitive function intact.  Memory is fair.  Judgment and insight is poor. Appears to have limited intellectual functioning.  DIAGNOSES: Axis I:    Bipolar disorder. Axis II:   1. Mental retardation.            2. Borderline personality disorder. Axis III:  1. Obesity.            2. Hypertension. Axis IV:   Problems related to social environment  and other psychosocial            problems. Axis V:    Current 25; estimated this past year 60.  PLAN:  Admission to [facility] for intentional overdose.  Contract for safety.  Check every 15 minutes.  The patient to be placed on the 400 Hall for close monitoring.  Will monitor her blood pressure closely.  Continue with her hydrochlorothiazide that was ordered while patient was hospitalized.  Will check further labs.  Will increase her coping skills.  The  patient to be medication-compliant.  The patient to follow up with mental health.  Our goal is to stabilize her mood and thinking so patient can be safe and functional.  TENTATIVE LENGTH OF STAY:  Four to six days. Dictated by:   Candi Leash. Orsini, N.P. Attending Physician:  Rachael Fee DD:  10/04/01 TD:  10/05/01 Job: [id number]

## 2010-09-30 NOTE — H&P (Signed)
[facility]  Patient:    Lauren [name], Lauren [name]                        MRN: [id number] Adm. Date:  [id number] Disc. Date: [id number] Attending:  Sandi Raveling CC:         Dr. Mila Homer, Mental Health  [facility]   History and Physical  CHIEF COMPLAINT:              "Took some pills".  HISTORY OF PRESENT ILLNESS:   The patient is a 50 year old black female sent to the emergency room after calling her case worker and claiming she had taken some of her pills after breaking into a drug cabinet at her group home.  She does spend part of her day unsupervised.  Her medications are administered to her by a case worker.  Tonight she broke into the pill cabinet and took pills. Initially it was unclear what she had taken.  Her case worker was able to speak with her earlier this morning and it turns out she took about 900 mg of Seroquel.  She claims she did not take anything else, although it is not entirely clear this is true.  She complains of some mild abdominal discomfort. She was taken to the emergency room and there was given IV fluids and charcoal 100 g p.o.  She does have a history of mental retardation and bipolar personality disorder, and has been in and out of various hospitals over the last five months, approximately on six occasions with episodes of self-mutilation, overdose, suicidal threats, etc.  Her primary physician care is at [facility].  Her primary psychiatrist is Dr. Mila Homer at [facility]. Her case manager is Burman Foster (beeper [phone]).  PAST MEDICAL HISTORY:         1. Mental retardation.                               2. Questionable hypertension.                               3. Question of bipolar disorder.                               4. Morbid obesity.  PAST SURGICAL HISTORY:        Cholecystectomy.  ALLERGIES:                    No known drug allergies per patient.  CURRENT MEDICATIONS:          1. Prozac 60 mg q.d.                         2. Depakote 250 mg q.a.m., 500 mg q.h.s.                               3. Risperdal 3 mg b.i.d.                               4. Hydrochlorothiazide 25 mg q.a.m.  5. Seroquel 150 mg q.h.s.                               6. Wellbutrin 150 mg q.a.m., 150 mg afternoon.  FAMILY HISTORY:               Not obtained.  SOCIAL HISTORY:               Not obtained.  REVIEW OF SYSTEMS:            The patient is having some mild abdominal pain.  PHYSICAL EXAMINATION:  GENERAL:                      Obese black female, alert and oriented x 3.  VITAL SIGNS:                  Blood pressure 122/65, heart rate 114, respirations 20, temperature 99.1 degrees.  Oxygen saturation 94% on room air.  HEENT:                        PERRL.  EOMI.  Tympanic membranes normal.  Oral cavity and pharynx clear.  NECK:                         Supple.  No lymphadenopathy.  CHEST:                        Lungs clear.  HEART:                        Regular rate and rhythm without murmurs, rubs, or gallops.  ABDOMEN:                      Obese, soft.  There is mild diffuse tenderness without rebound or guarding.  Normal bowel sounds.  PELVIC/RECTAL:                Examinations deferred.  EXTREMITIES:                  No edema.  NEUROLOGIC:                   Alert and oriented x 3.  Cranial nerves 2-12 intact.  She moves all extremities.  Reflexes 2/4 through with toes downgoing bilaterally.  LABORATORY DATA:              CBC shows a WBC of 10.5, hemoglobin 11.8, platelet count 256,000.  Sodium was 141, potassium 3.1, chloride 105, bicarbonate 21, BUN 13, creatinine 1.2.  Liver function tests are normal. Acetaminophen level less than 10, salicylate level less than 4.0.  Urine drug screen shows none detected of all constituents.  Valproic acid level 50.  PT 13.6, PTT 30.                                EKG shows sinus tachycardia, left atrial enlargement, left  ventricular hypertrophy; QTC and QRS intervals normal.  ASSESSMENT:                   1. Overdose.  It is not entirely clear what  the patient has taken.  She probably took                                  900 mg of Seroquel; overdose can lead to                                  central nervous system depression,                                  tachycardia, prolonged QRS and QT intervals,                                  premature ventricular contractions, and                                  seizures.  There have been no reported                                  fatalities associated with Seroquel overdose;                                  may resolve in 24 hours.                               2. Hypokalemia.  PLAN:                         Observation.  Hold Prozac, Risperdal, hydrochlorothiazide, Wellbutrin, Seroquel.  Will administer Depakote as her level is okay.  Seizure precautions.  Suicide precautions.  IV fluids. Telemetry.  Psychiatric consultation.  Replete potassium. DD:  02/19/00 TD:  02/20/00 Job: [id number]

## 2010-09-30 NOTE — Discharge Summary (Signed)
[facility]  Patient:    Lauren [name], Lauren [name]                      MRN: [id number] Adm. Date:  [id number] Disc. Date: 09/06/00 Attending:  Sanjuana Letters Dictator:   Ocie Doyne, M.D. CC:         Talmage Nap, M.D.  [facility]  Adelene Amas. Williford, M.D.,  Behavioral Health   Discharge Summary  DISCHARGE DIAGNOSES:  1. Acetaminophen overdose.  2. Urine drug screen positive for benzodiazepines; the patient is not taking     prescription benzodiazepines.  3. History of morbid obesity.  4. History of major depression.  5. History of anxiety.  6. History of borderline personalty disorder.  7. Hypertension.  8. History of post-traumatic stress disorder.  9. History of bipolar disorder. 10. History of multiple admissions for drug overdoses in the past.  DISCHARGE MEDICATIONS: 1. Hydrochlorothiazide 25 mg p.o. q.d. 2. Depakote 500 mg one p.o. q.a.m. and two p.o. q.p.m. 3. Prozac 20 mg three tabs q.d. 4. Risperdal 3 mg p.o. b.i.d. 5. Seroquel 150 mg p.o. q.h.s. 6. Wellbutrin 150 mg p.o. b.i.d.  CONSULTATIONS:  Adelene Amas. Williford, M.D., psychiatry.  ADMISSION HISTORY AND PHYSICAL:  This 50 year old African-American female patient with past medical history of mental retardation, bipolar disorder, borderline personalty disorder, obesity, and hypertension presented to the emergency department status post overdose of PMS formula, a compound containing acetaminophen 500 mg per pill, pamabrom 25 mg per tablet which is a diuretic and pyrilamine maleate 15 mg per pill.  The patient ingested a full bottle of 40 pills on September 04, 2000, at 2130 hours.  She has had multiple admissions in the past for overdose as well as a history of multiple involuntary commitments for suicidal gestures.  She currently lives at a group home with 24-hour supervision.  She obtained the pills on the afternoon before the admission at a store.   Apparently she is currently menstruating and therefore she bought the PMS formula for symptomatic relief.  Later that evening she decided that she wanted to hurt herself according to the patient. Medications which the patient brought with her to the emergency department include Depakote, Risperdal, Wellbutrin and Diflucan.  Medications listed on her medical record from clinic include hydrochlorothiazide, ibuprofen, Prozac, Seroquel which the patient is apparently not currently taking.  She lives at [facility] where she has her own apartment but 24-hour supervision.  No smoking, alcohol or drugs.  On exam vital signs were stable, there were no focal findings.  ADMISSION LABORATORIES:  A two-hour post-ingestion acetaminophen level was 153, alcohol less than 10, salicylate less than 4.  AST 32, ALT 11, total bilirubin 0.3.  WBC count 11.3, H&H 12.5 and 37.0, platelets 250.  Sodium 138, potassium 3.5, chloride 106, CO2 29, BUN 16, creatinine 1.0, glucose 93.  Urinalysis within normal limits.  Urine pregnancy is negative.  Urine drug screen positive for benzodiazepines.  HOSPITAL COURSE: #1 - ACETAMINOPHEN OVERDOSE:  The patient was admitted to the ICU for observation.  A Mucomyst loading dose of 140 mg/kg was given, followed by 70 mg/kg q.4h. x 17.  A four-hour postingestion acetaminophen level was 108 and an eight-hour level was 49.  She did not experience any respiratory depression.  There was no evidence for acute hepatotoxic effect of acetaminophen overdose.  The recommendation is that her AST, ALT, PT and total bilirubin be reassessed three days post-ingestion.  During hospitalization  she did not become toxic from acetaminophen.  #2 - PSYCHIATRIC:  The patient has a history of multiple attempts to injure herself.  When questioned in the ICU she stated that she was trying to hurt herself, but was unable to describe a reason.  She denies current suicidal ideation, but acknowledged  that she often thought of harming herself, although she denies feelings of sadness or hopelessness.  She likes the group home where she lives and was asking about returning there.  Psychiatrist, Dr. Jeanie Sewer, was consulted to evaluate her and recommended she be admitted to [facility] for further psychiatric evaluation; the patient was agreeable with this plan.  At discharge I am transferring her on all the psychoactive medications that are documented in our clinic chart and I will defer psychotropic medication adjustment to the physicians at [facility].  #3 - HYPERTENSION:  The patients blood pressures were within normal limits throughout hospitalization.  FOLLOWUP:  Followup will be with patients primary physician, Dr. Talmage Nap, at [facility].  She will call to arrange and appointment following her discharge from [facility].  ISSUES FOR FOLLOWUP:  On September 08, 2000, the patient needs to have serum AST, ALT, PT and total bilirubin evaluated; if she remains inpatient at [facility], this can be performed at that facility, otherwise she will need to return to [facility].  Behavioral Medicine please contact Dr. Jolinda Croak if the patient will be discharged prior to that time. DD:  09/06/00 TD:  09/06/00 Job: [id number]

## 2010-09-30 NOTE — H&P (Signed)
NAME:  Goodley, Mennie A                         ACCOUNT NO.:  [id number]   MEDICAL RECORD NO.:  [id number]                   PATIENT TYPE:  INP   LOCATION:  ED                                   FACILITY:  [facility]   PHYSICIAN:  Deanna Artis. Sharene Skeans, M.D.           DATE OF BIRTH:  June 07, 1960   DATE OF ADMISSION:  08/17/2003  DATE OF DISCHARGE:                                HISTORY & PHYSICAL   CHIEF COMPLAINT:  Unsteady gait, right arm incoordination.   HISTORY OF PRESENT ILLNESS:  Lauren [name] is a 50 year old morbidly obese  woman living in a group home who has hypertension, mental retardation,  gastroesophageal reflux disease, and post-traumatic stress disorder, among  other problems.  She awakened around 5 this morning and as she was walking  to the bathroom fell and was unable to get up.  EMS was called and assessed  her.  They found her sitting on the floor complaining of general weakness  and nausea, not responding like normal.  She has no signs of trauma.  She  was assisted to do a _______________to move down stairs and transported to  [facility].   The patient arrived at 6:10 a.m.  I was contacted at 1612 p.m. to evaluate  and admit the patient.   The patient has incoordination of the left arm, retropulsion when she  stands.  She does not complain of a headache, diplopia, dysarthria, or  dysphagia.  She has not had any nausea or vomiting.   REVIEW OF SYSTEMS:  Unremarkable for fever, cough, chest pain, rash.  She  has no other condition in the 12 system review.   PAST MEDICAL HISTORY:  As noted above.   PAST SURGICAL HISTORY:  Negative.   MEDICATIONS:  1. Protonix 40 mg daily.  2. Wellbutrin XL 150 mg daily.  3. _______________ 2 mg b.i.d.  4. Hydrochlorothiazide 25 mg daily.  5. Topamax 25 mg b.i.d.  6. Mirtazapine 30 mg p.o. daily.  7. Aspirin 325 mg daily.   ALLERGIES:  No known drug allergies.   FAMILY HISTORY:  Remarkable for stroke in mother,  maternal aunt, maternal  grandfather.  Also history of diabetes, hypertension, dyslipidemia, obesity.   SOCIAL HISTORY:  She lives in a group home.  She does not smoke nor does she  drink alcohol.   PHYSICAL EXAMINATION:  GENERAL:  A morbidly obese, right-handed, pleasant,  African-American female in no distress.  VITAL SIGNS:  Blood pressure 120/95, resting pulse 105, respirations 18,  temperature 98.1, pulse oximetry 96% on room air.  HEENT:  No infections, no bruits.  LUNGS:  Clear.  HEART:  No murmur, pulses normal.  ABDOMEN:  Massive, nontender, bowel sounds normal to diminished, no signs of  hepatosplenomegaly.  EXTREMITIES:  Unremarkable for edema, cyanosis, altered tone, or tenderness.  NEUROLOGIC:  Awake, alert, pleasant, names objects, follows commands, speaks  in sentences.  Cranial nerves:  Round reactive  pupils, fundi normal.  She  has an alternating exotropia, visual fields are full to counting fingers.  She has symmetric facial strength.  Midline tongue and uvula.  Air  conduction greater than bone conduction bilaterally.   Motor examination showed normal strength, tone, and mass.  Fine motor  movements were better on the right side than left.  Sensory examination:  No  neuropathy.  She had good stereoagnosis.  Cerebellar examination:  Left  intention tremor on finger-to-nose.  Gait:  Retropulsion (by history).  Deep  tendon reflexes are absent.  The patient had bilateral flexor plantar  responses.   IMPRESSION:  1. 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  2. Hypertension.  3. Morbid obesity.  4. Family history of stroke.  These are all risk factors for stroke.  5. Gastroesophageal reflux disease.  6. Post-traumatic stress disorder.   PLAN:  MRI and MRA (if she will fit in the machine).  Non-invasive workup,  including Doppler,  echocardiogram, and laboratory studies.  Aspirin.  We  will call the Family Practice Service to inquire whether or not they will be  willing to assume care to continue care for her stroke when she is  transferred to [facility] from [facility].  This may  occur tonight.                                               Deanna Artis. Sharene Skeans, M.D.    [facility]  D:  08/17/2003  T:  08/18/2003  Job:  [id number]   cc:   [facility]

## 2010-09-30 NOTE — Group Therapy Note (Signed)
NAME:  Lauren [name], Lauren [name]                         ACCOUNT NO.:  [id number]   MEDICAL RECORD NO.:  [id number]                   PATIENT TYPE:  OUT   LOCATION:  [facility]                           FACILITY:  [facility]   PHYSICIAN:  Argentina Donovan, MD                     DATE OF BIRTH:  May 01, 1961   DATE OF SERVICE:  12/01/2003                                    CLINIC NOTE   REASON FOR VISIT:  The patient is [name] 50 year old nulligravida with borderline  personality disorder and some mental retardation who is on Wellbutrin,  Topamax, Protonix, hydrochlorothiazide, Trilafon, Risperdal, Remeron, and  Prevacid.  She has [name] history of [name] gallbladder removed and of hypertension.  Other than that she has no other significant history.  She lives in homes,  assisted living, and she comes in with [name] complaint of genital pain on and  off but it has gone away and does not bother her anymore, apparently has  regular periods.  Abdominal exam:  Morbidly obese and soft, flat, nontender,  and no masses or organomegaly.  External genitalia is normal.  The vagina is  clean and well rugated.  BUS within normal limits.  The vagina is virginal.  The cervix is nulliparous and clean but the uterus and adnexa could not be  well palpated because of the habitus of the patient.  Pap smear was taken.   IMPRESSION:  Normal gynecological examination.                                               Argentina Donovan, MD    PR/MEDQ  D:  12/01/2003  T:  12/01/2003  Job:  [id number]

## 2010-10-03 ENCOUNTER — Encounter: Payer: Self-pay | Admitting: Family Medicine

## 2010-10-03 ENCOUNTER — Ambulatory Visit (INDEPENDENT_AMBULATORY_CARE_PROVIDER_SITE_OTHER): Payer: Medicare Other | Admitting: Family Medicine

## 2010-10-03 VITALS — BP 167/100 | HR 106 | Temp 98.5°F | Wt 323.0 lb

## 2010-10-03 DIAGNOSIS — I10 Essential (primary) hypertension: Secondary | ICD-10-CM

## 2010-10-03 NOTE — Patient Instructions (Signed)
  Hypertension (High Blood Pressure) Most people with high blood pressure (hypertension) have no symptoms. High blood pressure can be a dangerous problem. Hypertension is dangerous because you may have it and not know it. High blood pressure may mean that your heart needs to work harder to pump blood.  Your blood pressure is measured with 2 numbers. The first number is when your heart flexes (contracts), and the second number is when your heart relaxes. The higher the numbers are, the more you are at risk for problems. Write down your blood pressure today. The best blood pressure for adults is 120/80 (mmHg) or lower. It is likely that your blood pressure was recorded at least 2 times today. It is important for you to give these numbers to your doctor. If you do not have a doctor, try to get follow-up care at a hospital or community clinic. You may need to start high blood pressure medicine. You may also need to adjust your medicines as told by your doctor. Even mild high blood pressure increases long-term health risks. One high reading does not mean you have hypertension.  Your blood pressure should be taken when you are relaxed. It is also important to sit for about 10 minutes before being tested.   Things that can increase your blood pressure are:   Injury.  Illness.   Stress.   Caffeine.  Some medicines (like decongestants).    High blood pressure does not usually need emergency treatment.  HOME CARE  Lifestyle and medicine changes may be needed, including:   Weight loss.   Exercise.   Limit the use of salt.   Stop smoking.   If using decongestants or birth control pills, talk to your doctor. These medicines might make blood pressure higher.   Do not use street drugs.   Females should not drink more than 1 alcoholic drink per day. Males should not drink more than 2 alcoholic drinks per day.   Take your blood pressure medicine. You will need to take it every day. If you do  not get treated, there are risks, including:   Heart disease.   Stroke.   Kidney failure.   See your doctor as told.  GET HELP RIGHT AWAY IF:  You get a very bad headache.   You get blurred or changing vision.   You feel confused.   You feel weak, numb, or faint.   You get chest or belly (abdominal) pain.   You throw up (vomit).   You cannot breathe very well.  If you have a blood pressure reading with a top number of 180 or higher, you need to see your doctor right away. This is especially true if you are having any of the problems listed above. MAKE SURE YOU:  Understand these instructions.   Will watch your condition.   Will get help right away if you are not doing well or get worse.  Document Released: 10/18/2007 Document Re-Released: 10/19/2009 [facility] Patient Information 2011 [place], [place].Place appropriate patient instructions regarding hypertension here.

## 2010-10-03 NOTE — Progress Notes (Signed)
  Subjective:    Patient here for follow-up of elevated blood pressure.  She is not exercising and is adherent to a low-salt diet.  Blood pressure is well controlled at home. Cardiac symptoms: none. Patient denies: chest pain, chest pressure/discomfort, dyspnea, exertional chest pressure/discomfort, lower extremity edema, near-syncope, palpitations and tachypnea. Cardiovascular risk factors: hypertension, obesity (BMI >= 30 kg/m2) and sedentary lifestyle. Use of agents associated with hypertension: NSAIDS. History of target organ damage: chronic kidney disease.  The following portions of the patient's history were reviewed and updated as appropriate: allergies, current medications, past family history, past medical history, past social history, past surgical history and problem list.  Review of Systems Pertinent items are noted in HPI.     Objective:    BP 167/100  Pulse 106  Temp(Src) 98.5 F (36.9 C) (Oral)  Wt 323 lb (146.512 kg) General appearance: alert and cooperative Lungs: clear to auscultation bilaterally Heart: regular rate and rhythm, S1, S2 normal, no murmur, click, rub or gallop Abdomen: soft, non-tender; bowel sounds normal; no masses,  no organomegaly Extremities: extremities normal, atraumatic, no cyanosis or edema    Assessment:    Hypertension, controlled . Evidence of target organ damage: chronic kidney disease. Cr stable.  Continue maxide-hctz 37.5/25.  Repeat BP 140/90.    Plan:    Follow up: 1 month and as needed.

## 2010-11-22 ENCOUNTER — Encounter: Payer: Medicare Other | Admitting: Family Medicine

## 2010-11-23 ENCOUNTER — Telehealth: Payer: Self-pay | Admitting: Family Medicine

## 2010-11-23 NOTE — Telephone Encounter (Signed)
Patient has a lump on her arm that is swollen and sore and isn't sure whether she should wait until her schedule appt for next Wednesday or if she needs to be seen sooner.

## 2010-11-23 NOTE — Telephone Encounter (Signed)
Received call from patient's sister .  She noticed a knot on patient's arm a couple days ago.  Describes it as golf ball sized near/on the bicep.  The area was not reddened, did not look like hematoma nor infected.  The patient does c/o pain when she touches it.  Today it is smaller in size but still painful to touch.  Patient has an appointment next Wednesday.  Advised her to call me back tomorrow to see if it is continuing to get smaller and if not she may need to be seen sooner that next week.  Sister Tyrell Antonio) was agreeable.

## 2010-11-24 NOTE — Telephone Encounter (Signed)
Called patient's sister to check on status of knot on arm.  Left her a message to call us back.

## 2010-11-24 NOTE — Telephone Encounter (Signed)
Spoke with patient's sister who reports that the knot is still the same size but now patient c/o discomfort when she lifts her arm.  Advised her to bring her in tomorrow.

## 2010-11-25 ENCOUNTER — Ambulatory Visit (INDEPENDENT_AMBULATORY_CARE_PROVIDER_SITE_OTHER): Payer: Medicare Other | Admitting: Family Medicine

## 2010-11-25 DIAGNOSIS — M7710 Lateral epicondylitis, unspecified elbow: Secondary | ICD-10-CM

## 2010-11-25 NOTE — Progress Notes (Signed)
  Subjective:    Patient ID: Lauren Wood, female    DOB: Dec 25, 1960, 50 y.o.   MRN: [id number]  HPI Right lateral elbow pain- X 1 week.  Worse with touching lateral elbow or moving arm.  Better if not moving and resting arm.   Some swelling in lateral right elbow area earlier this week.  No redness. Never had this in the past.  No trauma to arm.  No falls.  No fever. Difficult to get accurate history 2/2 pt low mental function.    Review of Systems As per above    Objective:   Physical Exam  Musculoskeletal:       Right elbow exam: Point tenderness at lateral epicondyle.  No pain with full wrist extension against resistance.  But + pain in lateral epicondyle area with wrist flexion.  No swelling of joint.  No redness of joint.             Assessment & Plan:

## 2010-11-25 NOTE — Assessment & Plan Note (Addendum)
Most likely lateral elbow pain 2/2 tennis elbow.  Gave instructions regarding diagnosis and treatment.  Tylenol 650mg  po bid scheduled x 2 weeks, then prn.  Pt to do rehab exercises as per pt handout.  Also gave instructions for ice and heat therapy( see handout).  Pt to return in 2-4 weeks if not improving.

## 2010-11-25 NOTE — Patient Instructions (Signed)
Tylenol 650mg  by mouth 2 x day daily for 2 weeks. To see if this improves pain in elbow.  Do all exercises below 3 x per day.  Return if no improvement in 2 weeks or if any new or worsening of symptoms.   Epicondylitis, Lateral (Tennis Elbow) with Rehab  Lateral epicondylitis involves inflammation and pain around the outer portion of the elbow. The pain is caused by inflammation of the tendons in the forearm that  bring back (extend) the wrist. Lateral epicondylittis is also called tennis elbow, because it is very common in tennis players. However, it may occur in any individual who extends the wrist repetitively. If lateral epicondylitis is left untreated, it may become a chronic problem.   SYMPTOMS  Pain, tenderness, and inflammation on the outer (lateral) side of the elbow.  Pain or weakness with gripping activities.  Pain that increases with wrist twisting motions (playing tennis, using a screwdriver, opening a door or a jar).  Pain with lifting objects, including a coffee cup.   CAUSES  Lateral epicondylitis is caused by inflammation of the tendons that extend the wrist. Causes of injury may include:  Repetitive stress and strain on the muscles and tendons that extend the wrist.  Sudden change in activity level or intensity.  Incorrect grip in racquet sports.  Incorrect grip size of racquet (often too large).  Incorrect hitting position or technique (usually backhand, leading with the elbow).  Using a racket that is too heavy.   RISK INCREASES WITH   Sports or occupations that require repetitive and/or strenuous forearm and wrist movements (tennis, squash, racquetball, carpentry).  Poor wrist and forearm strength and flexibility.   Failure to warm up properly before activity.  Resuming activity before healing, rehabilitation, and conditioning are complete.   PREVENTIVE MEASURES   Warm up and stretch properly before activity.  Maintain physical fitness: l  Strength, flexibility, and endurance. l Cardiovascular fitness.  Wear and use properly fitted equipment.  Learn and use proper technique and have a coach correct improper technique.  Wear a tennis elbow (counterforce) brace.   PROGNOSIS The course of this condition depends on the degree of the injury. If treated properly, acute cases (symptoms lasting less than 4 weeks) are often resolved in 2 to 6 weeks. Chronic (longer lasting cases) often resolve in 3 to 6 months, but may require physical therapy.   POSSIBLE COMPLICATIONS   Frequently recurring symptoms, resulting in a chronic problem. Properly treating the problem the first time decreases frequency of recurrence.  Chronic inflammation, scarring tendon degeneration, and partial tendon tear, requiring surgery.  Delayed healing or resolution of symptoms.   GENERAL TREATMENT CONSIDERATIONS  Treatment first involves the use of ice and medicine, to reduce pain and inflammation. Strengthening and stretching exercises may help reduce discomfort, if performed regularly. These exercises may be performed at home, if the condition is an acute injury. Chronic cases may require a referral to a physical therapist for evaluation and treatment. Your caregiver may advise a corticosteroid injection, to help reduce inflammation. Rarely, surgery is needed.   MEDICATION:   If pain medicine is needed, nonsteroidal anti-inflammatory medicines (aspirin and ibuprofen), or other minor pain relievers (acetaminophen), are often advised.   Do not take pain medicine for 7 days before surgery.   Prescription pain relievers may be given, if your caregiver thinks they are needed. Use only as directed and only as much as you need.  Corticosteroid injections may be recommended. These injections should be reserved only  for the most severe cases, because they can only be given a certain number of times.   HEAT AND COLD:   Cold treatment (icing) should be applied  for 10 to 15 minutes every 2 to 3 hours for inflammation and pain, and immediately after activity that aggravates your symptoms. Use ice packs or an ice massage.  Heat treatment may be used before performing stretching and strengthening activities prescribed by your caregiver, physical therapist, or athletic trainer. Use a heat pack or a warm water soak.     SEEK MEDICAL CARE IF:  Symptoms get worse or do not improve in 2 weeks, despite treatment.     EXERCISES   RANGE OF MOTION AND STRETCHING EXERCISES - Epicondylitis, Lateral (Tennis Elbow) These exercises may help you when beginning to rehabilitate your injury.  Your symptoms may go away with or without further involvement from your physician, physical therapist or athletic trainer. While completing these exercises, remember:   Restoring tissue flexibility helps normal motion to return to the joints. This allows healthier, less painful movement and activity.  An effective stretch should be held for at least 30 seconds.  A stretch should never be painful. You should only feel a gentle lengthening or release in the stretched tissue.    RANGE OF MOTION - Wrist Flexion, Active-Assisted  Extend your _____right_____ elbow with your fingers pointing down.*   Gently pull the back of your hand towards you, until you feel a gentle stretch on the top of your forearm.   Hold this position for _____10_____ seconds.  Repeat ____5______ times.  Complete this exercise ______3____ times per day.    *If directed by your physician, physical therapist or athletic trainer, complete this stretch with your elbow bent, rather than extended.     RANGE OF MOTION - Wrist Extension, Active-Assisted   Extend your ___right_______ elbow and turn your palm upwards.*  Gently pull your palm and fingertips back, so your wrist extends and your fingers point more toward the ground.    You should feel a gentle stretch on the inside of your forearm.  Hold this  position for ______10____ seconds.  Repeat ___5_______ times. Complete this exercise ____3______ times per day. *If directed by your physician, physical therapist or athletic trainer, complete this stretch with your elbow bent, rather than extended.     STRETCH - Wrist Flexion   Place the back of your ___right_______ hand on a tabletop, leaving your elbow slightly bent. Your fingers should point away from your body.  Gently press the back of your hand down onto the table by straightening your elbow. You should feel a stretch on the top of your forearm.   Hold this position for _____10_____ seconds.  Repeat ______5____ times. Complete this stretch ___3_______ times per day.     STRETCH - Wrist Extension   Place your ____right hand______ fingertips on a tabletop, leaving your elbow slightly bent. Your fingers should point backwards.  Gently press your fingers and palm down onto the table by straightening your elbow. You should feel a stretch on the inside of your forearm.   Hold this position for _______10__ seconds.  Repeat ______5____ times. Complete this stretch ____3______ times per day.      STRENGTHENING EXERCISES - Epicondylitis, Lateral (Tennis Elbow) These exercises may help you when beginning to rehabilitate your injury. They may resolve your symptoms with or without further involvement from your physician, physical therapist or athletic trainer. While completing these exercises, remember:   Muscles can  gain both the endurance and the strength needed for everyday activities through controlled exercises.  Complete these exercises as instructed by your physician, physical therapist or athletic trainer. Increase the resistance and repetitions only as guided.  You may experience muscle soreness or fatigue, but the pain or discomfort you are trying to eliminate should never worsen during these exercises. If this pain does get worse, stop and make sure you are following the  directions exactly. If the pain is still present after adjustments, discontinue the exercise until you can discuss the trouble with your caregiver.     STRENGTH - Wrist Flexors  Sit with your __________ forearm palm-up and fully supported on a table or countertop. Your elbow should be resting below the height of your shoulder. Allow your wrist to extend over the edge of the surface.   Loosely holding a __________ pound weight, or a piece of rubber exercise band or tubing, slowly curl your hand up toward your forearm.    Hold this position for __________ seconds. Slowly lower the wrist back to the starting position in a controlled manner. Repeat __________ times. Complete this exercise __________ times per day.      STRENGTH - Wrist Extensors  Sit with your __________ forearm palm-down and fully supported on a table or countertop. Your elbow should be resting below the height of your shoulder. Allow your wrist to extend over the edge of the surface.  Loosely holding a __________ pound weight, or a piece of rubber exercise band or tubing, slowly curl your hand up toward your forearm.    Hold this position for __________ seconds. Slowly lower the wrist back to the starting position in a controlled manner. Repeat __________ times. Complete this exercise __________ times per day.      STRENGTH - Ulnar Deviators  Stand with a ____________________ pound weight in your __________ hand, or sit while holding a rubber exercise band or tubing, with your healthy arm supported on a table or countertop.  Move your wrist, so that your pinkie travels toward your forearm and your thumb moves away from your forearm.  Hold this position for __________ seconds and then slowly lower the wrist back to the starting position. Repeat __________ times. Complete this exercise __________ times per day     STRENGTH - Radial Deviators  Stand with a __________ pound weight in your __________ hand, or sit while  holding a rubber exercise band or tubing, with your injured arm supported on a table or countertop.  Raise your hand upward in front of you or pull up on the rubber tubing.  Hold this position for time301 seconds and then slowly lower the wrist back to the starting position.  Repeat __________ times. Complete this exercise __________ times per day.     STRENGTH - Forearm Supinators   Sit with your __________ forearm supported on a table, keeping your elbow below shoulder height.  Rest your hand over the edge, palm down.   Gently grip a __________ oz. hammer or a soup ladle.   Without moving your elbow, slowly turn your palm and hand upward to a "thumbs-up" position.  Hold this position for __________ seconds. Slowly return to the starting position.  Repeat __________ times. Complete this exercise __________ times per day.     STRENGTH - Forearm Pronators   Sit with your __________ forearm supported on a table, keeping your elbow below shoulder height. Rest your hand over the edge, palm up.  Gently grip a __________ oz.  hammer or a soup ladle.   Without moving your elbow, slowly turn your palm and hand upward to a "thumbs-up" position.  Hold this position for __________ seconds.  Slowly return to the starting position.  Repeat __________ times. Complete this exercise __________ times per day.     STRENGTH - Grip   Grasp a tennis ball, a dense sponge, or a large, rolled sock in your hand.  Squeeze as hard as you can, without increasing any pain.  Hold this position for __________ seconds. Release your grip slowly. Repeat __________ times. Complete this exercise __________ times per day.    STRENGTH - Elbow Extensors, Isometric   Stand or sit upright, on a firm surface. Place your __________ arm so that your palm faces your stomach, and it is at the height of your waist.  Place your opposite hand on the underside of your forearm. Gently push up as your __________ arm  resists. Push as hard as you can with both arms, without causing any pain or movement at your __________ elbow.  Hold this stationary position for __________ seconds. Gradually release the tension in both arms. Allow your muscles to relax completely before repeating.   Document Released: 05/01/2005  Document Re-Released: 02/25/2009 [facility] Patient Information 2011 [place], [place].

## 2010-11-30 ENCOUNTER — Encounter: Payer: Medicare Other | Admitting: Family Medicine

## 2010-12-06 ENCOUNTER — Telehealth: Payer: Self-pay | Admitting: Family Medicine

## 2010-12-06 ENCOUNTER — Encounter: Payer: Self-pay | Admitting: Family Medicine

## 2010-12-06 NOTE — Telephone Encounter (Signed)
Called pt. The medications were prescribed at the [facility] I advised her to call the [facility] But, she wants [place] to change the correct doses and meds in our system. I told there to bring in her new list of medications for her next visit with her PCP and it will be changed then. Pt agreed. Fwd. To Dr.Campbell for info .Arlyss Repress

## 2010-12-06 NOTE — Telephone Encounter (Signed)
Error

## 2010-12-06 NOTE — Telephone Encounter (Signed)
Was prescribed some incorrect dosages and they need to be corrected and would like to speak to someone about getting them corrected.  Benztropine, Wellbutrin, Mirtazapine and Risperidone and has 2 that she is no longer taking, they are Saphris and Lorazepam, Zonisalide.  They use [employer] and [employer].

## 2010-12-06 NOTE — Telephone Encounter (Signed)
This encounter was created in error - please disregard.

## 2010-12-28 ENCOUNTER — Ambulatory Visit (INDEPENDENT_AMBULATORY_CARE_PROVIDER_SITE_OTHER): Payer: Medicare Other | Admitting: Family Medicine

## 2010-12-28 ENCOUNTER — Other Ambulatory Visit (HOSPITAL_COMMUNITY)
Admission: RE | Admit: 2010-12-28 | Discharge: 2010-12-28 | Disposition: A | Discharge status: Home / Self Care | Payer: Medicare Other | Source: Ambulatory Visit | Attending: Family Medicine | Admitting: Family Medicine

## 2010-12-28 DIAGNOSIS — Z0000 Encounter for general adult medical examination without abnormal findings: Secondary | ICD-10-CM

## 2010-12-28 DIAGNOSIS — F79 Unspecified intellectual disabilities: Secondary | ICD-10-CM

## 2010-12-28 DIAGNOSIS — IMO0002 Reserved for concepts with insufficient information to code with codable children: Secondary | ICD-10-CM

## 2010-12-28 DIAGNOSIS — Z124 Encounter for screening for malignant neoplasm of cervix: Secondary | ICD-10-CM

## 2010-12-28 DIAGNOSIS — K219 Gastro-esophageal reflux disease without esophagitis: Secondary | ICD-10-CM

## 2010-12-28 DIAGNOSIS — F29 Unspecified psychosis not due to a substance or known physiological condition: Secondary | ICD-10-CM

## 2010-12-28 MED ORDER — OMEPRAZOLE 20 MG PO CPDR: 20.0000 mg | DELAYED_RELEASE_CAPSULE | Freq: Every day | ORAL | Status: DC

## 2010-12-28 MED ORDER — FLUOXETINE HCL 40 MG PO CAPS: 40.0000 mg | ORAL_CAPSULE | Freq: Every day | ORAL | Status: DC

## 2010-12-28 MED ORDER — MIRTAZAPINE 15 MG PO TABS: 30.0000 mg | ORAL_TABLET | Freq: Every day | ORAL | Status: DC

## 2010-12-28 MED ORDER — BENZTROPINE MESYLATE 1 MG PO TABS: 0.5000 mg | ORAL_TABLET | Freq: Two times a day (BID) | ORAL | Status: DC

## 2010-12-28 MED ORDER — TRIAMTERENE-HCTZ 37.5-25 MG PO TABS: 1.0000 | ORAL_TABLET | Freq: Every day | ORAL | Status: DC

## 2010-12-28 MED ORDER — ETODOLAC 400 MG PO TABS: 400.0000 mg | ORAL_TABLET | Freq: Two times a day (BID) | ORAL | Status: DC

## 2010-12-28 MED ORDER — DOCUSATE SODIUM 100 MG PO CAPS: 100.0000 mg | ORAL_CAPSULE | Freq: Two times a day (BID) | ORAL | Status: DC

## 2010-12-28 MED ORDER — RISPERIDONE 2 MG PO TABS: 2.0000 mg | ORAL_TABLET | Freq: Two times a day (BID) | ORAL | Status: DC

## 2010-12-28 MED ORDER — BUPROPION HCL ER (SR) 150 MG PO TB12: 300.0000 mg | ORAL_TABLET | Freq: Every day | ORAL | Status: DC

## 2010-12-28 NOTE — Progress Notes (Signed)
Subjective:     Lauren Wood is a 50 y.o. female and is here for a comprehensive physical exam. The patient reports no problems.  History   Social History  . Marital Status: Single    Spouse Name: N/A    Number of Children: N/A  . Years of Education: N/A   Occupational History  . Not on file.   Social History Main Topics  . Smoking status: Never Smoker   . Smokeless tobacco: Not on file  . Alcohol Use: Not on file  . Drug Use: Not on file  . Sexually Active: Not on file   Other Topics Concern  . Not on file   Social History Narrative  . No narrative on file   Health Maintenance  Topic Date Due  . Pap Smear  03/16/1978  . Colonoscopy  03/16/2010  . Influenza Vaccine  02/13/2011  . Tetanus/tdap  11/13/2011  . Mammogram  04/19/2012    The following portions of the patient's history were reviewed and updated as appropriate: allergies, current medications, past family history, past medical history, past social history, past surgical history and problem list.  Review of Systems Pertinent items are noted in HPI.   Objective:    General appearance: alert, cooperative and morbidly obese Eyes: conjunctivae/corneas clear. PERRL, EOM's intact. Fundi benign. Ears: normal TM's and external ear canals both ears and cerumen impaction of left canal obscuring the left TM and right TM with eustachian tube present otherwise normal Nose: Nares normal. Septum midline. Mucosa normal. No drainage or sinus tenderness. Throat: lips, mucosa, and tongue normal; teeth and gums normal Neck: no adenopathy, no carotid bruit, no JVD, supple, symmetrical, trachea midline and thyroid not enlarged, symmetric, no tenderness/mass/nodules Lungs: clear to auscultation bilaterally Heart: regular rate and rhythm and soft systolic murmur Abdomen: soft, non-tender; bowel sounds normal; no masses,  no organomegaly and obses Pelvic: cervix normal in appearance, external genitalia normal, no adnexal masses or  tenderness, no cervical motion tenderness, rectovaginal septum normal, uterus normal size, shape, and consistency, vagina normal without discharge and pap smear Extremities: extremities normal, atraumatic, no cyanosis or edema Skin: Skin color, texture, turgor normal. No rashes or lesions or intertriginal redness without odor    Assessment:    Healthy female exam.  1. Pap performed 2. Well adult exam     Plan:    1. Check CBC, CMP, FLP, TSH due to medications 2. Colonoscopy referral due to age 45. Mammogram UTD. Breast exam deferred today See After Visit Summary for Counseling Recommendations

## 2010-12-28 NOTE — Patient Instructions (Signed)
It was a pleasure to care for you today.  Please return in an am after fasting for additional lab work.   You have been referred for a colonoscopy to evaluate for colon cancer.  We will call you.  If we do not please call within one month.  Please return in 3-6 months for a check up.

## 2011-01-02 ENCOUNTER — Other Ambulatory Visit: Payer: Medicare Other

## 2011-01-02 DIAGNOSIS — IMO0002 Reserved for concepts with insufficient information to code with codable children: Secondary | ICD-10-CM

## 2011-01-02 LAB — COMPREHENSIVE METABOLIC PANEL
ALT: <8 U/L (ref 0–35)
AST: 13 U/L (ref 0–37)
Albumin: 3.9 g/dL (ref 3.5–5.2)
Alkaline Phosphatase: 156 U/L — ABNORMAL HIGH (ref 39–117)
BUN: 21 mg/dL (ref 6–23)
CO2: 26 mEq/L (ref 19–32)
Calcium: 9.6 mg/dL (ref 8.4–10.5)
Chloride: 104 mEq/L (ref 96–112)
Creat: 1.27 mg/dL — ABNORMAL HIGH (ref 0.50–1.10)
Glucose, Bld: 70 mg/dL (ref 70–99)
Potassium: 4.6 mEq/L (ref 3.5–5.3)
Sodium: 140 mEq/L (ref 135–145)
Total Bilirubin: 0.3 mg/dL (ref 0.3–1.2)
Total Protein: 7.1 g/dL (ref 6.0–8.3)

## 2011-01-02 LAB — CBC
HCT: 37 % (ref 36.0–46.0)
Hemoglobin: 11.6 g/dL — ABNORMAL LOW (ref 12.0–15.0)
MCH: 25.8 pg — ABNORMAL LOW (ref 26.0–34.0)
MCHC: 31.4 g/dL (ref 30.0–36.0)
MCV: 82.4 fL (ref 78.0–100.0)
Platelets: 315 10*3/uL (ref 150–400)
RBC: 4.49 MIL/uL (ref 3.87–5.11)
RDW: 15.8 % — ABNORMAL HIGH (ref 11.5–15.5)
WBC: 10.3 10*3/uL (ref 4.0–10.5)

## 2011-01-02 LAB — LIPID PANEL
Cholesterol: 123 mg/dL (ref 0–200)
HDL: 52 mg/dL (ref >39)
LDL Cholesterol: 61 mg/dL (ref 0–99)
Total CHOL/HDL Ratio: 2.4 Ratio
Triglycerides: 50 mg/dL (ref <150)
VLDL: 10 mg/dL (ref 0–40)

## 2011-01-02 NOTE — Progress Notes (Signed)
Cmp, cbc and flp done today Lauren Wood 

## 2011-01-17 ENCOUNTER — Telehealth: Payer: Self-pay | Admitting: *Deleted

## 2011-01-17 NOTE — Telephone Encounter (Signed)
Sister ,  Lorine Iannaccone, comes to office to ask about a medication that podiatrist has just started patient on, Etodalac 500 mg with directions to take one twice daily as needed..  The pharmacist told her to check with PCP because some medication she is on may interact.   This med is listed on med list  and was sent in 12/28/2010 by Dr. Orvan Falconer.   Sister states she never picked this up and patient has not taken this med in over a year.   The strength sent in by Dr. Orvan Falconer was 400 mg.  Patient is also on ASA  81 mg.. Consulted with Dr. Deirdre Priest and he advises  OK to take the Etodalac 500 mg as directed but needs appointment with PCP. Appointment scheduled with Dr. Shawnie Pons 01/23/2011. Advised to take medication with food.  Called pharmacist and verified that she never picked up 400 mg strength and advised pharmacist to cancel that rx. Will forward message to Dr. Deirdre Priest  And he will enter  on med list.

## 2011-01-17 NOTE — Telephone Encounter (Signed)
Corrected Etodolac dose in Med list

## 2011-01-23 ENCOUNTER — Ambulatory Visit: Payer: Medicare Other | Admitting: Family Medicine

## 2011-01-31 ENCOUNTER — Ambulatory Visit: Payer: Medicare Other | Admitting: Family Medicine

## 2011-02-06 ENCOUNTER — Ambulatory Visit (INDEPENDENT_AMBULATORY_CARE_PROVIDER_SITE_OTHER): Payer: Medicare Other | Admitting: Family Medicine

## 2011-02-06 ENCOUNTER — Encounter: Payer: Self-pay | Admitting: Family Medicine

## 2011-02-06 VITALS — BP 154/84 | HR 105 | Temp 98.4°F | Wt 324.1 lb

## 2011-02-06 DIAGNOSIS — I509 Heart failure, unspecified: Secondary | ICD-10-CM

## 2011-02-06 DIAGNOSIS — I5042 Chronic combined systolic (congestive) and diastolic (congestive) heart failure: Secondary | ICD-10-CM

## 2011-02-06 DIAGNOSIS — I10 Essential (primary) hypertension: Secondary | ICD-10-CM

## 2011-02-06 DIAGNOSIS — N183 Chronic kidney disease, stage 3 unspecified: Secondary | ICD-10-CM | POA: Insufficient documentation

## 2011-02-06 DIAGNOSIS — IMO0002 Reserved for concepts with insufficient information to code with codable children: Secondary | ICD-10-CM

## 2011-02-06 DIAGNOSIS — I5030 Unspecified diastolic (congestive) heart failure: Secondary | ICD-10-CM | POA: Insufficient documentation

## 2011-02-06 DIAGNOSIS — F79 Unspecified intellectual disabilities: Secondary | ICD-10-CM

## 2011-02-06 DIAGNOSIS — N189 Chronic kidney disease, unspecified: Secondary | ICD-10-CM

## 2011-02-06 LAB — COMPREHENSIVE METABOLIC PANEL
ALT: <8 U/L (ref 0–35)
AST: 12 U/L (ref 0–37)
Albumin: 4.1 g/dL (ref 3.5–5.2)
Alkaline Phosphatase: 157 U/L — ABNORMAL HIGH (ref 39–117)
BUN: 21 mg/dL (ref 6–23)
CO2: 25 mEq/L (ref 19–32)
Calcium: 9.8 mg/dL (ref 8.4–10.5)
Chloride: 104 mEq/L (ref 96–112)
Creat: 1.3 mg/dL — ABNORMAL HIGH (ref 0.50–1.10)
Glucose, Bld: 76 mg/dL (ref 70–99)
Potassium: 4.2 mEq/L (ref 3.5–5.3)
Sodium: 141 mEq/L (ref 135–145)
Total Bilirubin: 0.4 mg/dL (ref 0.3–1.2)
Total Protein: 7.5 g/dL (ref 6.0–8.3)

## 2011-02-06 NOTE — Assessment & Plan Note (Addendum)
No red flags. Previously well controlled. Will check again in 1 month.

## 2011-02-06 NOTE — Assessment & Plan Note (Signed)
Patient on Etodolac secondary to foot osteoarthritis per Podiatry. Will check CMET today and in 1 month to monitor and make sure Cr does not elevate from baseline of 1.1-1.3.

## 2011-02-06 NOTE — Progress Notes (Signed)
  Subjective:    Patient ID: Lauren Wood, female    DOB: 04-04-1961, 50 y.o.   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 The patient says that she has been taking the medication for approximately 1 week up to twice a day for foot pain. S[facility] but they said she did not need any special footwear.   Concerning her hypertension and CHF, she admits to being very nervous today and worked up as she came in the door. She denies CP, blurry vision, SOB, DOE, edema.    Pmhx-reviewed with patient (moved A.R. To medical history as not an active problem.  Review of Systemsnegative except as noted in HPI      Objective:   Physical Exam  Constitutional: She is oriented to person, place, and time. No distress.       Morbidly obese   HENT:  Head: Normocephalic and atraumatic.  Nose: Nose normal.  Mouth/Throat: Oropharynx is clear and moist. No oropharyngeal exudate.  Eyes: Conjunctivae and EOM are normal. Pupils are equal, round, and reactive to light. Right eye exhibits no discharge. Left eye exhibits no discharge.  Neck: Normal range of motion. Neck supple.  Cardiovascular: Normal rate, regular rhythm, normal heart sounds and intact distal pulses.  Exam reveals no gallop and no friction rub.   No murmur heard.      Did not hear murmur that had been noted on previous exam.   Pulmonary/Chest: Effort normal and breath sounds normal. No respiratory distress. She has no wheezes. She has no rales. She exhibits no tenderness.  Abdominal: Soft. She exhibits no distension. There is no tenderness.  Musculoskeletal: She exhibits no edema.  Neurological: She is alert and oriented to person, place, and time. She has normal  reflexes.  Skin: Skin is warm and dry. She is not diaphoretic.   BP 154/84  Pulse 105  Temp(Src) 98.4 F (36.9 C) (Oral)  Wt 324 lb 1.6 oz (147.011 kg)        Assessment & Plan:  See problem list -will see patient back in 1 month to repeat CMET and repeat blood pressure reading as patient had previously been moderately well controlled -patient will continue to take etodolac until next visit or unless instructed otherwise based on CMET today

## 2011-02-06 NOTE — Patient Instructions (Addendum)
It was a pleasure to meet you today, Lauren Wood!  We would like for you to go to the lab to get kidney and liver tests since you are on a new medication, Lodine. I will call you to let you know if the results are abnormal, otherwise assume they are ok and it is ok to continue the medicine.   We would like to see you back in 1 month to repeat the test. You can see either Dr. Shawnie Pons, me (Dr. Durene Cal), or Dr. Adrian Blackwater.

## 2011-02-06 NOTE — Assessment & Plan Note (Addendum)
Patient currently asymptomatic. Will monitor.  Consider future therapy with beta blocker or aldosterone antagonist (spironalactone or eplerenone with mortality benefit). Will defer alterations in hypertension and CHF management to PCP, but CHF monitored at this appointment.

## 2011-02-07 ENCOUNTER — Encounter: Payer: Self-pay | Admitting: Family Medicine

## 2011-03-14 ENCOUNTER — Encounter: Payer: Self-pay | Admitting: Family Medicine

## 2011-03-14 ENCOUNTER — Ambulatory Visit (INDEPENDENT_AMBULATORY_CARE_PROVIDER_SITE_OTHER): Payer: Medicare Other | Admitting: Family Medicine

## 2011-03-14 VITALS — BP 160/78 | HR 114 | Temp 98.7°F | Ht 64.5 in | Wt 331.0 lb

## 2011-03-14 DIAGNOSIS — M159 Polyosteoarthritis, unspecified: Secondary | ICD-10-CM

## 2011-03-14 DIAGNOSIS — N189 Chronic kidney disease, unspecified: Secondary | ICD-10-CM

## 2011-03-14 DIAGNOSIS — I10 Essential (primary) hypertension: Secondary | ICD-10-CM

## 2011-03-14 DIAGNOSIS — Z23 Encounter for immunization: Secondary | ICD-10-CM

## 2011-03-14 DIAGNOSIS — K219 Gastro-esophageal reflux disease without esophagitis: Secondary | ICD-10-CM

## 2011-03-14 DIAGNOSIS — G479 Sleep disorder, unspecified: Secondary | ICD-10-CM

## 2011-03-14 DIAGNOSIS — I5042 Chronic combined systolic (congestive) and diastolic (congestive) heart failure: Secondary | ICD-10-CM

## 2011-03-14 DIAGNOSIS — F29 Unspecified psychosis not due to a substance or known physiological condition: Secondary | ICD-10-CM

## 2011-03-14 MED ORDER — LOSARTAN POTASSIUM 25 MG PO TABS: 25.0000 mg | ORAL_TABLET | Freq: Every day | ORAL | Status: DC

## 2011-03-14 NOTE — Patient Instructions (Signed)
It was good to see you again Lauren Wood!  For your blood pressure and heart, I would like for you to stop taking your Maxzide. I sent in a new prescription Losartan 25mg  for you to take once a day. If you develop any lip swelling or shortness of breath, I want you to go to the emergency room immediately.   For your foot pain, I want you to use the Etodolac 500mg  VERY sparingly. Try to use tylenol instead. I want you to buy some wider shoes so your feet arent so cramped.   I would like to see you back in 3 weeks to follow your blood pressure and potentially start another medication that can help your heart.   Thanks, Dr. Durene Cal

## 2011-03-14 NOTE — Progress Notes (Deleted)
  Subjective:    Patient ID: Lauren Wood, female    DOB: January 26, 1961, 50 y.o.   MRN: [id number]  HPI  Denies any CP, HA, SOB, blurry vision, LE edema, orthopnea, PND.     Review of Systems     Objective:   Physical Exam        Assessment & Plan:

## 2011-03-14 NOTE — Assessment & Plan Note (Signed)
Started on losartan (ace allergy). Beta blocker at next appointment if blood pressure tolerates. Will provide patient with CHF handout for next appointment. This is due to the mortality benefit of ace/arbs and beta blockers and heart failure. May also need to consider spironalactone for mortality benefit.

## 2011-03-14 NOTE — Progress Notes (Signed)
  Subjective:    Patient ID: Lauren Wood, female    DOB: February 23, 1961, 50 y.o.   MRN: [id number]  HPI  Patient is a 18F with a history of mental retardation with behavioral issues, morbid obesity, HTN, CKD stage III, CHF with EF 35-40% and osteoarthritis of multiple joints including several in her feet here for blood pressure and CHF followup.   Patient at most recent visit at a systolic blood pressure greater than 150. Initial blood pressure here today at 160/78 with automated cuff. Repeat blood pressure was 122/73 with manual cuff. Patient requires a large cuff. Believe that patient's recent elevations were due to an innappropriately sized cuff. For hypertension and CHF, Denies any CP, HA, SOB, blurry vision, LE edema, orthopnea, PND.   Patient continues to take etodolac/lodine 500 mg up to BID per her podiatrist. At the last visit the patient was taking the medication twice a day pretty frequently. She is now taking the medication approximately every other day with just one pill. Her creatinine was checked last time and was stable from baseline.  Concerning morbid obesity, the patient does not exercise at all. She has been very sedentary. She used to walk with her sister but she is afraid of the dogs in the neighborhood. Her sister had previously done arm exercises and is interested in showing the patient how to do these.   No current GERD symptoms  Review of Systems negative except as noted in HPI      Objective:   Physical Exam  Vitals reviewed. Constitutional: She is oriented to person, place, and time. No distress.       Morbidly obese   HENT:  Head: Normocephalic and atraumatic.  Mouth/Throat: Oropharynx is clear and moist. No oropharyngeal exudate.  Neck: Normal range of motion. Neck supple.  Cardiovascular: Normal rate, regular rhythm, normal heart sounds and intact distal pulses.  Exam reveals no gallop and no friction rub.   No murmur heard.      Once again Did not hear  murmur that had been noted on previous exam.   Pulmonary/Chest: Effort normal and breath sounds normal. No respiratory distress. She has no wheezes. She has no rales.  Abdominal: Soft. She exhibits no distension. There is no tenderness.  Musculoskeletal: She exhibits no edema.       Patient's feet appear too large for shoes.   Neurological: She is alert and oriented to person, place, and time. She has normal reflexes.  Skin: Skin is warm and dry. She is not diaphoretic.   BP 160/78  Pulse 114  Temp(Src) 98.7 F (37.1 C) (Oral)  Ht 5' 4.5" (1.638 m)  Wt 331 lb (150.141 kg)  BMI 55.94 kg/m2 Patient has been tachycardic on past visits most likely related to anxiety as does not like to come to office.      Assessment & Plan:  See problem list -will hold off on cmet today. will see patient back in 1 month to repeat CMET and repeat blood pressure reading as patient switched to losartan from maxzide today.  -patient to use etodolac VERY sparingly only if tylenol doesn't control pain.

## 2011-03-14 NOTE — Assessment & Plan Note (Signed)
Patient is to start walking one to 2 times per week. Patient's sister Jeri Lager is going to show patient how to do arm exercises while sitting. The plan is for the patient to do these 3 times a week.

## 2011-03-14 NOTE — Assessment & Plan Note (Addendum)
Well controlled on Maxzide 25. Patient requires a large cuff manually. SBPs into 150s 160s with small cuff. 120s with large cuff. Will stop maxzide, start losartan (ace allergy). F/u in 3 weeks. Will obtain BMET at that time.

## 2011-03-14 NOTE — Assessment & Plan Note (Signed)
Total patient to use etodolac very sparingly. She is to use Tylenol instead for her osteoarthritis. We will do this to protect her kidney function. We will also start the patient on postpartum. We will recheck a bmet in 3 weeks when she returns.

## 2011-03-14 NOTE — Assessment & Plan Note (Signed)
Continue omeprazole 

## 2011-03-16 ENCOUNTER — Telehealth: Payer: Self-pay | Admitting: *Deleted

## 2011-03-16 ENCOUNTER — Other Ambulatory Visit: Payer: Self-pay | Admitting: Family Medicine

## 2011-03-16 DIAGNOSIS — K219 Gastro-esophageal reflux disease without esophagitis: Secondary | ICD-10-CM

## 2011-03-16 MED ORDER — OMEPRAZOLE 20 MG PO CPDR: 20.0000 mg | DELAYED_RELEASE_CAPSULE | Freq: Every day | ORAL | Status: DC

## 2011-03-16 NOTE — Telephone Encounter (Signed)
Refill request received from [facility] for omeprazole 20 mg. Will forward to Dr. Shawnie Pons.

## 2011-04-04 ENCOUNTER — Ambulatory Visit: Payer: Medicare Other | Admitting: Family Medicine

## 2011-04-11 ENCOUNTER — Other Ambulatory Visit: Payer: Self-pay | Admitting: Family Medicine

## 2011-04-11 DIAGNOSIS — Z1231 Encounter for screening mammogram for malignant neoplasm of breast: Secondary | ICD-10-CM

## 2011-04-26 ENCOUNTER — Ambulatory Visit: Payer: Medicare Other

## 2011-05-18 ENCOUNTER — Ambulatory Visit
Admission: RE | Admit: 2011-05-18 | Discharge: 2011-05-18 | Disposition: A | Discharge status: Home / Self Care | Payer: Medicare Other | Source: Ambulatory Visit | Attending: Family Medicine | Admitting: Family Medicine

## 2011-05-18 DIAGNOSIS — Z1231 Encounter for screening mammogram for malignant neoplasm of breast: Secondary | ICD-10-CM

## 2011-05-24 ENCOUNTER — Encounter: Payer: Self-pay | Admitting: Family Medicine

## 2011-05-24 ENCOUNTER — Ambulatory Visit (INDEPENDENT_AMBULATORY_CARE_PROVIDER_SITE_OTHER): Payer: Medicare Other | Admitting: Family Medicine

## 2011-05-24 VITALS — BP 131/84 | HR 112 | Temp 98.9°F | Ht 64.5 in | Wt 336.0 lb

## 2011-05-24 DIAGNOSIS — R6889 Other general symptoms and signs: Secondary | ICD-10-CM | POA: Insufficient documentation

## 2011-05-24 MED ORDER — CHLORPHENIRAMINE-ACETAMINOPHEN 2-325 MG PO TABS: 2.0000 | ORAL_TABLET | Freq: Three times a day (TID) | ORAL | Status: DC | PRN

## 2011-05-24 MED ORDER — PE T-DEXBROM T-PYRIL-DM T 25-4-3.5-30 MG/5ML PO SUSP: 10.0000 mL | Freq: Two times a day (BID) | ORAL | Status: DC | PRN

## 2011-05-24 NOTE — Progress Notes (Signed)
Subjective:     Patient ID: Lauren Wood, female   DOB: 1960/06/12, 51 y.o.   MRN: [id number]  HPI 51 year old female with history significant for cognitive impairment, mixed systolic diastolic congestive heart failure and morbid obesity presents accompanied by her sister to followup influenza-like illness. History obtained from patient and her sister. The patient began to be symptomatic with fatigue and productive cough on 05/12/2011. It is believed that she had influenza because her sister with who she lives with diagnosed with influenza a few weeks earlier. She stayed home from work for the entire following week and self treated with NyQuil and Mucinex. She denies associated fever. Admits to associated myalgias. She presents now with persistent cough although her fatigue and myalgias have resolved. She denies shortness of breath. She reports her cough is productive of yellow sputum. She denies hemoptysis. She returned to work on Monday, 05/22/2011. She did receive her flu shot this season.  Review of Systems Pertinent review of systems as per history of present illness   additionally patient denies lower extremity swelling, chest pain, shortness of breath Objective:   Physical Exam Filed Vitals:   05/24/11 1402  BP: 131/84  Pulse: 112  Temp: 98.9 F (37.2 C)    General: Obese female in no acute distress. HEENT: Moist mucous membranes with clear oropharynx. No cervical adenopathy. Respiratory: Normal work of breathing. Lungs clear to auscultation bilaterally. No rales, wheezing, rhonchi. Cardiovascular: Sinus rhythm regular rate Extremities: No edema Skin: Warm and dry    Assessment:     Persistent cough  following influenza-like illness. No evidence of a pneumonia on physical exam. Patient with low O2 sat but is asymptomatic and breathing comfortably.    Plan:     Continue supportive management okay to use over-the-counter cough medicine recommended Coricidin HBP as patient is  hypertensive at baseline. She is currently normotensive. Reviewed red flags that might suggest possible pneumonia with patient her sister and the need for followup if these symptoms present. Otherwise anticipate continued clinical improvement.

## 2011-05-24 NOTE — Assessment & Plan Note (Addendum)
A: Persistent cough  following influenza-like illness. No evidence of a pneumonia on physical exam. Patient with low O2 sat but is asymptomatic and breathing comfortably.      P: Continue supportive management okay to use over-the-counter cough medicine recommended Coricidin HBP as patient is hypertensive at baseline. She is currently normotensive. Reviewed red flags that might suggest possible pneumonia with patient her sister and the need for followup if these symptoms present. Otherwise anticipate continued clinical improvement.

## 2011-05-24 NOTE — Patient Instructions (Addendum)
Lauren Wood,  It is very nice to meet you.  Please the cough medicine as needed for cough.  Please call me if you develop worsening cough, shortness of breath, worsening sputum production as these may be signs of pneumonia. Otherwise continue to get better.   Please change pt PCP to Dr. Armen Pickup   -Dr. Armen Pickup

## 2011-05-31 ENCOUNTER — Encounter: Payer: Self-pay | Admitting: Family Medicine

## 2011-05-31 DIAGNOSIS — F79 Unspecified intellectual disabilities: Secondary | ICD-10-CM

## 2011-05-31 DIAGNOSIS — F29 Unspecified psychosis not due to a substance or known physiological condition: Secondary | ICD-10-CM

## 2011-05-31 DIAGNOSIS — F603 Borderline personality disorder: Secondary | ICD-10-CM

## 2011-05-31 HISTORY — DX: Borderline personality disorder: F60.3

## 2011-06-16 ENCOUNTER — Other Ambulatory Visit: Payer: Self-pay | Admitting: Family Medicine

## 2011-06-16 MED ORDER — FLUOXETINE HCL 40 MG PO CAPS: 40.0000 mg | ORAL_CAPSULE | Freq: Every day | ORAL | Status: DC

## 2011-06-20 ENCOUNTER — Other Ambulatory Visit: Payer: Self-pay | Admitting: Family Medicine

## 2011-06-20 DIAGNOSIS — I5042 Chronic combined systolic (congestive) and diastolic (congestive) heart failure: Secondary | ICD-10-CM

## 2011-06-20 MED ORDER — LOSARTAN POTASSIUM 25 MG PO TABS: 25.0000 mg | ORAL_TABLET | Freq: Every day | ORAL | Status: DC

## 2011-08-09 ENCOUNTER — Other Ambulatory Visit: Payer: Self-pay | Admitting: Family Medicine

## 2011-08-09 DIAGNOSIS — K219 Gastro-esophageal reflux disease without esophagitis: Secondary | ICD-10-CM

## 2011-08-09 MED ORDER — OMEPRAZOLE 20 MG PO CPDR: 20.0000 mg | DELAYED_RELEASE_CAPSULE | Freq: Every day | ORAL | Status: DC

## 2011-08-09 MED ORDER — ASPIRIN 81 MG PO TBEC: 81.0000 mg | DELAYED_RELEASE_TABLET | Freq: Every day | ORAL | Status: DC

## 2011-08-09 MED ORDER — FLUOXETINE HCL 40 MG PO CAPS: 40.0000 mg | ORAL_CAPSULE | Freq: Every day | ORAL | Status: DC

## 2011-08-09 MED ORDER — DOCUSATE SODIUM 100 MG PO CAPS: 100.0000 mg | ORAL_CAPSULE | Freq: Two times a day (BID) | ORAL | Status: DC

## 2011-08-09 MED ORDER — MIRTAZAPINE 15 MG PO TABS: 30.0000 mg | ORAL_TABLET | Freq: Every day | ORAL | Status: DC

## 2011-08-09 MED ORDER — RISPERIDONE 2 MG PO TABS: 2.0000 mg | ORAL_TABLET | Freq: Two times a day (BID) | ORAL | Status: DC

## 2011-08-09 MED ORDER — BUPROPION HCL ER (SR) 150 MG PO TB12: 300.0000 mg | ORAL_TABLET | Freq: Every day | ORAL | Status: DC

## 2011-08-09 NOTE — Telephone Encounter (Signed)
Refilled medications per patient's sister (primary caretakers request). Patient will need f/u visit prior to refill for psychiatric medications. Goal is for pt's psychiatrist to prescribe and monitor her medications in the future.

## 2011-08-17 ENCOUNTER — Other Ambulatory Visit: Payer: Self-pay | Admitting: Family Medicine

## 2011-09-18 ENCOUNTER — Other Ambulatory Visit: Payer: Self-pay | Admitting: Family Medicine

## 2011-09-18 MED ORDER — BENZTROPINE MESYLATE 0.5 MG PO TABS: 0.2500 mg | ORAL_TABLET | Freq: Two times a day (BID) | ORAL | Status: DC

## 2011-09-18 MED ORDER — CLONAZEPAM 0.5 MG PO TABS: 0.2500 mg | ORAL_TABLET | Freq: Two times a day (BID) | ORAL | Status: DC | PRN

## 2011-09-18 NOTE — Telephone Encounter (Signed)
Phoned in requested refills.

## 2011-09-18 NOTE — Telephone Encounter (Signed)
Sister is calling about refills for Clonezapam which she needs today and for Benztropine Mesylate which she has no more refills on file, sent to [facility].

## 2011-10-20 ENCOUNTER — Telehealth: Payer: Self-pay | Admitting: Family Medicine

## 2011-10-20 NOTE — Telephone Encounter (Signed)
Spoke with Jacqualyn Posey, med list faxed to her at [phone].

## 2011-10-20 NOTE — Telephone Encounter (Signed)
LMOVM asking pts sister to call back.  Is she ok with the snapshot which will have her diagnosis as well as some of personal/family history?  This is the only med list I can print that I am aware of. Elta Angell, Maryjo [place]

## 2011-10-20 NOTE — Telephone Encounter (Signed)
Patient is calling to get a copy of med list faxed to her.

## 2011-12-05 ENCOUNTER — Encounter: Payer: Self-pay | Admitting: Family Medicine

## 2011-12-05 ENCOUNTER — Telehealth: Payer: Self-pay | Admitting: *Deleted

## 2011-12-05 ENCOUNTER — Ambulatory Visit (INDEPENDENT_AMBULATORY_CARE_PROVIDER_SITE_OTHER): Payer: Medicare Other | Admitting: Family Medicine

## 2011-12-05 ENCOUNTER — Telehealth: Payer: Self-pay | Admitting: Family Medicine

## 2011-12-05 ENCOUNTER — Ambulatory Visit (HOSPITAL_COMMUNITY)
Admission: RE | Admit: 2011-12-05 | Discharge: 2011-12-05 | Disposition: A | Discharge status: Home / Self Care | Payer: Medicare Other | Source: Ambulatory Visit | Attending: Family Medicine | Admitting: Family Medicine

## 2011-12-05 VITALS — BP 162/88 | HR 106 | Temp 97.9°F | Ht 64.5 in | Wt 360.9 lb

## 2011-12-05 DIAGNOSIS — M7989 Other specified soft tissue disorders: Secondary | ICD-10-CM | POA: Insufficient documentation

## 2011-12-05 DIAGNOSIS — M79609 Pain in unspecified limb: Secondary | ICD-10-CM

## 2011-12-05 DIAGNOSIS — R600 Localized edema: Secondary | ICD-10-CM

## 2011-12-05 DIAGNOSIS — R609 Edema, unspecified: Secondary | ICD-10-CM

## 2011-12-05 DIAGNOSIS — M79606 Pain in leg, unspecified: Secondary | ICD-10-CM

## 2011-12-05 MED ORDER — TRAMADOL HCL 50 MG PO TABS: 100.0000 mg | ORAL_TABLET | Freq: Three times a day (TID) | ORAL | Status: AC | PRN

## 2011-12-05 NOTE — Research (Signed)
[place] dIET study  investigating the diagnostic utility of D-dimer  results to rule out the presence of DVT discussed with   patient's sister (health care power of attorney).  All questions and concerns were addressed and answered prior to obtaining informed consent.  No study procedures were initiated prior to obtaining informed consent.  A copy of the signed consent was provided to the patient's sister.  Please contact study coordinator of Dr. Chancy Milroy with any questions or concerns.

## 2011-12-05 NOTE — Telephone Encounter (Signed)
Dois Davenport at [facility]. Lab attempted to call our office, but we were closed for lunch.  Venous doppler was negative.  Patient has already left.  Note routed to Dr. Gwendolyn Grant.  Gaylene Brooks, RN

## 2011-12-05 NOTE — Progress Notes (Signed)
Subjective:     Patient ID: Lauren Wood, female   DOB: [birth year], 51 y.o.   MRN: [id number]  HPI Lauren Wood is a 51 y/o female with a history of osteoarthritis, intellectual disability, and morbid obesity who comes to clinic today with new leg pain.   She states that the pain began sometime yesterday. She indicates that the pain is worse just below her knee on the medial aspect of her lower leg and extends into calf. She states that she also noticed that it was slightly swollen. She reports the pain is made worse by walking or bearing weight. She states that in general it feels different than her normal arthritis pain.   She denies any history of gout or previous blood clot. No swelling of knee joint itself.   She denies any pain in other joints, fever, nausea, vomiting, diarrhea, changes in her urine or stool, shortness of breath, chest pains, or swelling in her legs.     Review of Systems As per above Objective:   Physical Exam General: Morbidly obese woman in no acute distress HEENT: Normocephalic atraumatic Pulm: Lungs clear to auscultation bilaterally, no increased work of breathing or tachypnea.   CV: RRR, no murmurs, rubs, or gallops Ext: No edema Right leg: Pain on palpation and with swelling noted Right calf.  +1 pitting edema BL to mid-shin, however worse on Right side.  Knee nontender and no pain with A/PROM.  there is no redness, not warm to the touch.  No tenderness along medial or lateral joint line.  Left leg: There is no pain with passive movement, no tenderness to palpation, no effusion, no redness, not warm to touch    Assessment/Plan     1. Right knee pain: Given that the effusion is below the knee, concerned that this might represent DVT. Absence of edema and redness is reassuring that it most likely is not DVT, but will get venous U/S rule out. Also will prescribe tramadol to help control pain. Advised will be contacted with the results of U/S, and assuming it is  normal, to return in two weeks if her pain is not better.   ** Note opened in medical student's name,however I completed history and physical.  Please see problem list for my assessment and plan.

## 2011-12-05 NOTE — Telephone Encounter (Signed)
Called and had to leave message.  Wanted to relay to patient that her Doppler was negative.  Called both home and mobile numbers.

## 2011-12-05 NOTE — Telephone Encounter (Signed)
Attempted to call patient back but had to leave message.  Dopplers negative.  Please see phone note.

## 2011-12-05 NOTE — Progress Notes (Signed)
Right lower extremity venous duplex completed.  Preliminary report is no obvious evidence of DVT in the right leg and left common femoral vein.  Technically difficult study due to the patient's body habitus.

## 2011-12-05 NOTE — Patient Instructions (Addendum)
It was nice meeting you today!  We are going to give you a medicine called Tramadol to help with the pain in the knee. You can also try a heating pad.   We want you to get an ultrasound of your leg to make sure you do not have a blood clot. Please go over to the hospital to get this done.

## 2011-12-05 NOTE — Progress Notes (Signed)
Pt told to go over to [facility] and check in 1st floor admitting to get registered. This procedure does not require prior approval pt's care giver was given the sheet that details this.   Member Member ID Date of Birth Address [place]   [name]  [id number] M  Sep 13, 1960  [name]  [place]  [id number]   Member Programs   Member ID Program Effective Date Termination Date   [id number] M  [place] MEDICAID-NON-DELEGATED  08/14/2011  05/14/9998     NOTE: This [facility] member does not require prior authorization for OUTPATIENT Radiology through [facility] or [place] [facility] at this time.     Lauren Wood [place]

## 2011-12-06 ENCOUNTER — Encounter: Payer: Self-pay | Admitting: Family Medicine

## 2011-12-06 DIAGNOSIS — R600 Localized edema: Secondary | ICD-10-CM | POA: Insufficient documentation

## 2011-12-06 NOTE — Assessment & Plan Note (Signed)
Concern is for DVT in this 51 year old with unilateral leg pain and swelling.  Does not appear to be secondary to knee or joint.  No redness or swelling of knee itself.  No fevers or chills, no signs of septic arthritis, knee itself is nontender. Plan to send directly to [facility] for venous dopplers.  Will call patient with results.   Tramadol for pain relief FU with [place] next week if negative Dopplers to assess for improving leg pain.

## 2011-12-08 NOTE — Telephone Encounter (Signed)
Called and relayed info regarding negative Doppler.  Patient's sister answered (she is caregiver) and was appreciative.  Patient is walking better and has less pain.  Using heating pad with relief. FU next week if still having pain.  Sister expressed understanding.

## 2012-01-14 ENCOUNTER — Telehealth: Payer: Self-pay | Admitting: Family Medicine

## 2012-01-14 NOTE — Telephone Encounter (Signed)
Message copied by Dessa Phi on Sun Jan 14, 2012  7:11 PM ------      Message from: Dessa Phi      Created: Fri Jan 05, 2012 12:41 PM       Needs doctor note to monitor and limit sugar intake

## 2012-01-24 ENCOUNTER — Other Ambulatory Visit: Payer: Self-pay | Admitting: Family Medicine

## 2012-01-24 MED ORDER — CLONAZEPAM 1 MG PO TABS: 1.0000 mg | ORAL_TABLET | Freq: Every evening | ORAL | Status: DC | PRN

## 2012-01-24 NOTE — Telephone Encounter (Signed)
Patient changed to Klonopin 1 mg q HS by Dr. Rogers Blocker, [place] on 11/14/11.

## 2012-01-29 ENCOUNTER — Ambulatory Visit: Payer: Medicare Other

## 2012-01-30 ENCOUNTER — Ambulatory Visit (INDEPENDENT_AMBULATORY_CARE_PROVIDER_SITE_OTHER): Payer: Medicare Other | Admitting: *Deleted

## 2012-01-30 DIAGNOSIS — Z23 Encounter for immunization: Secondary | ICD-10-CM

## 2012-02-11 ENCOUNTER — Other Ambulatory Visit: Payer: Self-pay | Admitting: Family Medicine

## 2012-03-20 ENCOUNTER — Telehealth: Payer: Self-pay | Admitting: Family Medicine

## 2012-03-20 NOTE — Telephone Encounter (Signed)
Patient needs a list of her medications faxed to [facility] - fax # [phone].  She is at the office now, and needs it as soon as possible.

## 2012-03-20 NOTE — Telephone Encounter (Addendum)
Spoke with [name] and advised that  we do need a signed Release of Records  before we can fax this. She will try to do this now.

## 2012-03-20 NOTE — Telephone Encounter (Signed)
Release of records received and med list faxed as requested.

## 2012-05-21 ENCOUNTER — Ambulatory Visit: Payer: Medicare Other | Admitting: Family Medicine

## 2012-06-14 ENCOUNTER — Other Ambulatory Visit: Payer: Self-pay | Admitting: Family Medicine

## 2012-08-02 ENCOUNTER — Ambulatory Visit (INDEPENDENT_AMBULATORY_CARE_PROVIDER_SITE_OTHER): Payer: Medicare Other | Admitting: Family Medicine

## 2012-08-02 ENCOUNTER — Encounter: Payer: Self-pay | Admitting: Family Medicine

## 2012-08-02 VITALS — BP 135/80 | HR 105 | Temp 98.6°F | Ht 64.5 in | Wt 369.0 lb

## 2012-08-02 DIAGNOSIS — I10 Essential (primary) hypertension: Secondary | ICD-10-CM

## 2012-08-02 DIAGNOSIS — I5042 Chronic combined systolic (congestive) and diastolic (congestive) heart failure: Secondary | ICD-10-CM

## 2012-08-02 DIAGNOSIS — K219 Gastro-esophageal reflux disease without esophagitis: Secondary | ICD-10-CM

## 2012-08-02 LAB — BASIC METABOLIC PANEL
BUN: 27 mg/dL — ABNORMAL HIGH (ref 6–23)
CO2: 30 mEq/L (ref 19–32)
Calcium: 9.7 mg/dL (ref 8.4–10.5)
Chloride: 101 mEq/L (ref 96–112)
Creat: 1.1 mg/dL (ref 0.50–1.10)
Glucose, Bld: 76 mg/dL (ref 70–99)
Potassium: 4.7 mEq/L (ref 3.5–5.3)
Sodium: 139 mEq/L (ref 135–145)

## 2012-08-02 LAB — CBC
HCT: 38.1 % (ref 36.0–46.0)
Hemoglobin: 12 g/dL (ref 12.0–15.0)
MCH: 24.2 pg — ABNORMAL LOW (ref 26.0–34.0)
MCHC: 31.5 g/dL (ref 30.0–36.0)
MCV: 76.8 fL — ABNORMAL LOW (ref 78.0–100.0)
Platelets: 305 10*3/uL (ref 150–400)
RBC: 4.96 MIL/uL (ref 3.87–5.11)
RDW: 17.1 % — ABNORMAL HIGH (ref 11.5–15.5)
WBC: 9.4 10*3/uL (ref 4.0–10.5)

## 2012-08-02 LAB — FERRITIN: Ferritin: 32 ng/mL (ref 10–291)

## 2012-08-02 MED ORDER — OMEPRAZOLE 20 MG PO CPDR: 40.0000 mg | DELAYED_RELEASE_CAPSULE | Freq: Every day | ORAL | Status: DC

## 2012-08-02 MED ORDER — CARVEDILOL 6.25 MG PO TABS: 6.2500 mg | ORAL_TABLET | Freq: Two times a day (BID) | ORAL | Status: DC

## 2012-08-02 NOTE — Assessment & Plan Note (Signed)
A: mixed heart failure. No evidence of fluid overload at the moment.  P: Start coreg. Check CBC and ferritin.  Discussed low salt diet. Discussed weight management and monitoring in regard to fluid.

## 2012-08-02 NOTE — Assessment & Plan Note (Signed)
A: chronic cough sound like worsening GERD.  P: Increase omeprazole to 40 mg daily Advised avoidance of caffeine and citrus foods.

## 2012-08-02 NOTE — Patient Instructions (Addendum)
Thank your for coming in today.  For HTN: BP great today. Please come back next week for RN visit BP check. Avoid salty foods.  Exercise 20 minutes most days of the week with goal of slow and steady weight loss.   Congestive heart failure: Checking labs today. Also adding low dose coreg (beta blocker) to improve heart contractility. The big thing with heart failure is avoiding excessive salt because salt makes the body hold onto water.   For cough: Sounds like GERD Increase omeprazole to 40 mg daily Avoid eating late at night. Avoid acidic foods at night (tomato, citrus fruit, coffee, soda, chocolate).   F/u with nurse for BP check in one week.  F/u with me in 4 weeks.   Dr. Armen Pickup

## 2012-08-02 NOTE — Progress Notes (Signed)
Subjective:     Patient ID: Lauren Wood, female   DOB: 11-19-1960, 52 y.o.   MRN: [id number]  HPI 52 yo F with psychotic disorder presents with her sister to discuss the following:  1. Elevated BP: high BP readings at work. BP taken almost daily at L wrist and L upper range. Range -123-206/67-149. Patient compliant with cozaar. She denies HA, vision changes, CP, SOB and swelling in legs.   2. Cough: x 10 weeks. Worse at night. Minimally productive of yellow sputum. Some bitter tastes in mouth. No fever, CP, SOB or sick contacts. Was taking cough medicine but taking it anymore.   3. CHF systolic: denies CP, SOB and fatigue. Compliant with ARB. Gaining weight, 30 lbs over last year.   Review of Systems As per HPI     Objective:   Physical Exam BP 135/80  Pulse 105  Temp(Src) 98.6 F (37 C) (Oral)  Ht 5' 4.5" (1.638 m)  Wt 369 lb (167.377 kg)  BMI 62.38 kg/m2 General appearance: alert, cooperative and no distress, morbidly obese  Nose: mucoid discharge, turbinates pink, swollen, grade 1 out of 4 Throat: lips, mucosa, and tongue normal; teeth and gums normal Lungs: clear to auscultation bilaterally Heart: regular rate and rhythm, S1, S2 normal, no murmur, click, rub or gallop Extremities: extremities normal, atraumatic, no cyanosis or edema     Assessment and Plan:

## 2012-08-02 NOTE — Assessment & Plan Note (Signed)
A: BP normal in office today.  P: Continue cozaar Start coreg for heart failure Check BMP

## 2012-08-09 ENCOUNTER — Ambulatory Visit: Payer: Medicare Other | Admitting: *Deleted

## 2012-08-09 VITALS — BP 130/90 | HR 100 | Wt 368.0 lb

## 2012-08-09 DIAGNOSIS — I10 Essential (primary) hypertension: Secondary | ICD-10-CM

## 2012-08-09 NOTE — Progress Notes (Signed)
Patient in office with sister for BP check . BP checked manually using thigh cuff. BP LA 130/90 and RA 130/80 pulse 100.  Sister reports she has not started taking  new medication sent to pharmacy at last visit, Coreg.  Not picked up yet.   Patient states she is feeling well. No chest pain .  Weight today 368  #  Consulted with Dr. Earnest Bailey . Will  follow up with PCP as planned .

## 2012-09-05 ENCOUNTER — Ambulatory Visit (INDEPENDENT_AMBULATORY_CARE_PROVIDER_SITE_OTHER): Payer: Medicare Other | Admitting: Family Medicine

## 2012-09-05 VITALS — BP 147/82 | HR 96 | Temp 98.8°F | Resp 20 | Wt 372.0 lb

## 2012-09-05 DIAGNOSIS — J209 Acute bronchitis, unspecified: Secondary | ICD-10-CM | POA: Insufficient documentation

## 2012-09-05 MED ORDER — ACETAMINOPHEN-CODEINE #2 300-15 MG PO TABS: 1.0000 | ORAL_TABLET | Freq: Two times a day (BID) | ORAL | Status: DC | PRN

## 2012-09-05 MED ORDER — ALBUTEROL SULFATE HFA 108 (90 BASE) MCG/ACT IN AERS: 2.0000 | INHALATION_SPRAY | Freq: Two times a day (BID) | RESPIRATORY_TRACT | Status: DC | PRN

## 2012-09-05 NOTE — Patient Instructions (Addendum)
[place],  Thank you for coming in today.   For bronchitis,  Take tylenol #2 before, can take one other time but not before driving or working.  Continue mucinex.  Use albuterol twice daily.  F/u if you develop fever, worsening cough or chest pain.  Dr. Armen Pickup

## 2012-09-05 NOTE — Progress Notes (Signed)
Subjective:     Patient ID: Lauren Wood, female   DOB: 15-May-1961, 52 y.o.   MRN: [id number]  HPI 52 yo F with HTN, CHF, CKD and obesity presents for evaluation of the following:  1. Cold: x 6 days. Sick contact at work. Symptoms include cough productive of yellow sputum. Cough is worse at night and associated with abdominal discomfort. Patient denies CP, SOB, fever. Taking mucinex and corcidin HBP w/o improvement.   Review of Systems As per HPI     Objective:   Physical Exam BP 147/82  Pulse 96  Temp(Src) 98.8 F (37.1 C) (Oral)  Resp 20  Wt 372 lb (168.738 kg)  BMI 62.89 kg/m2  SpO2 92% General appearance: alert, cooperative and no distress Eyes: conjunctivae/corneas clear. PERRL, EOM's intact.  Ears: normal TM's and external ear canals both ears Nose: Nares normal. Septum midline. Mucosa normal. No drainage or sinus tenderness. Throat: lips, mucosa, and tongue normal; teeth and gums normal Lungs: rhonchi bilaterally and wheezes bilaterally Heart: regular rate and rhythm, S1, S2 normal, no murmur, click, rub or gallop    Assessment and Plan:

## 2012-09-05 NOTE — Assessment & Plan Note (Signed)
A: acute bronchitis with wheezing. Suspect viral. Non toxic appearing.  P: Continue mucinex with corcidin HBP Add albuterol BID Add tylenol #2 BID Reviewed s/s to prompt return to care. If worsens but not requiring hospitalization will treat for CAP with levaquin 750 mg daily for 5-7 days.

## 2012-10-29 ENCOUNTER — Encounter: Payer: Medicare Other | Admitting: Family Medicine

## 2012-11-06 ENCOUNTER — Other Ambulatory Visit: Payer: Self-pay

## 2012-11-06 ENCOUNTER — Ambulatory Visit (INDEPENDENT_AMBULATORY_CARE_PROVIDER_SITE_OTHER): Payer: Medicare Other | Admitting: Family Medicine

## 2012-11-06 ENCOUNTER — Encounter: Payer: Self-pay | Admitting: Family Medicine

## 2012-11-06 VITALS — BP 147/67 | HR 98 | Temp 98.5°F | Ht 64.5 in | Wt 369.0 lb

## 2012-11-06 DIAGNOSIS — I5042 Chronic combined systolic (congestive) and diastolic (congestive) heart failure: Secondary | ICD-10-CM

## 2012-11-06 DIAGNOSIS — Z0000 Encounter for general adult medical examination without abnormal findings: Secondary | ICD-10-CM

## 2012-11-06 DIAGNOSIS — Z1231 Encounter for screening mammogram for malignant neoplasm of breast: Secondary | ICD-10-CM

## 2012-11-06 DIAGNOSIS — I10 Essential (primary) hypertension: Secondary | ICD-10-CM

## 2012-11-06 DIAGNOSIS — N764 Abscess of vulva: Secondary | ICD-10-CM

## 2012-11-06 LAB — CBC
HCT: 35.7 % — ABNORMAL LOW (ref 36.0–46.0)
Hemoglobin: 11.4 g/dL — ABNORMAL LOW (ref 12.0–15.0)
MCH: 24.1 pg — ABNORMAL LOW (ref 26.0–34.0)
MCHC: 31.9 g/dL (ref 30.0–36.0)
MCV: 75.5 fL — ABNORMAL LOW (ref 78.0–100.0)
Platelets: 311 10*3/uL (ref 150–400)
RBC: 4.73 MIL/uL (ref 3.87–5.11)
RDW: 17 % — ABNORMAL HIGH (ref 11.5–15.5)
WBC: 10.3 10*3/uL (ref 4.0–10.5)

## 2012-11-06 LAB — COMPREHENSIVE METABOLIC PANEL
ALT: <8 U/L (ref 0–35)
AST: 13 U/L (ref 0–37)
Albumin: 3.4 g/dL — ABNORMAL LOW (ref 3.5–5.2)
Alkaline Phosphatase: 127 U/L — ABNORMAL HIGH (ref 39–117)
BUN: 17 mg/dL (ref 6–23)
CO2: 26 mEq/L (ref 19–32)
Calcium: 9.4 mg/dL (ref 8.4–10.5)
Chloride: 104 mEq/L (ref 96–112)
Creat: 1.06 mg/dL (ref 0.50–1.10)
Glucose, Bld: 76 mg/dL (ref 70–99)
Potassium: 4.7 mEq/L (ref 3.5–5.3)
Sodium: 140 mEq/L (ref 135–145)
Total Bilirubin: 0.3 mg/dL (ref 0.3–1.2)
Total Protein: 7 g/dL (ref 6.0–8.3)

## 2012-11-06 LAB — POCT GLYCOSYLATED HEMOGLOBIN (HGB A1C): Hemoglobin A1C: 5.6

## 2012-11-06 LAB — IRON AND TIBC
%SAT: 8 % — ABNORMAL LOW (ref 20–55)
Iron: 33 ug/dL — ABNORMAL LOW (ref 42–145)
TIBC: 400 ug/dL (ref 250–470)
UIBC: 367 ug/dL (ref 125–400)

## 2012-11-06 MED ORDER — MEDICAL COMPRESSION STOCKINGS MISC: 1.0000 | Freq: Every day | Status: DC

## 2012-11-06 MED ORDER — CARVEDILOL 12.5 MG PO TABS: 12.5000 mg | ORAL_TABLET | Freq: Two times a day (BID) | ORAL | Status: DC

## 2012-11-06 NOTE — Patient Instructions (Addendum)
Rudy to keep coming in today. You're due for your mammogram this year. He had a boil on your left labia majora. It is small and there is nothing to drain. I like you to do is to apply warm compress to this area. If you feel that the areas getting bigger and there is fluid in it please come back so I can reevaluate and possibly drain the fluid.  I have increased her Coreg to 12.5 mg twice daily. I prescribed compression stockings free to wear.  With heart failure when the thing is to make sure you do not eat a lot of extra salt. Eating extra salt will cause is willing to worsen.  Dr. Armen Pickup

## 2012-11-06 NOTE — Progress Notes (Signed)
Patient ID: Lauren Wood, female   DOB: 03/09/1961, 52 y.o.   MRN: [id number] Subjective:     Lauren Wood is a 52 y.o. female and is here for a comprehensive physical exam. The patient reports    #1 bilateral foot swelling: Swelling is intermittent. Relieved with elevation. There is minimal associated pain. There is no associated chest pain shortness of breath.  #2 vaginal bump: Patient is noticed a bump in her vaginal area. Has been there for less than a week. She noticed an area in the shower. There is no drainage or tenderness. The patient is not sexually active.  #3 health maintenance: Patient is due for mammogram and colonoscopy.  History   Social History  . Marital Status: Single    Spouse Name: N/A    Number of Children: N/A  . Years of Education: N/A   Occupational History  . Not on file.   Social History Main Topics  . Smoking status: Never Smoker   . Smokeless tobacco: Not on file  . Alcohol Use: Not on file  . Drug Use: Not on file  . Sexually Active: Not on file   Other Topics Concern  . Not on file   Social History Narrative  . No narrative on file   Health Maintenance  Topic Date Due  . Colonoscopy  03/16/2010  . Tetanus/tdap  11/13/2011  . Mammogram  05/17/2012  . Influenza Vaccine  01/13/2013  . Pap Smear  01/02/2014   Review of Systems Patient Information Form: Screening and ROS  AUDIT-C Score: 0 Do you feel safe in relationships? Yes  PHQ-2:positive  Review of Symptoms  General:  Negative for nexplained weight loss, fever Skin: Negative for new or changing mole, sore that won't heal HEENT: Positive for trouble hearing, hoarseness/changing voice. Negative for  trouble seeing, ringing in ears, mouth sores, dysphagia. CV:  Negative for chest pain, dyspnea, edema, palpitations Resp: Negative for cough, dyspnea, hemoptysis GI: Negative for nausea, vomiting, diarrhea, constipation, abdominal pain, melena, hematochezia. GU:  Positive for  incontinence. Negative for dysuria, urinary hesitance, hematuria, vaginal or penile discharge, polyuria, sexual difficulty, lumps in testicle or breasts MSK: Negative for muscle cramps or aches, joint pain or swelling Neuro: Negative for headaches, weakness, numbness, dizziness, passing out/fainting Psych: Negative for depression, anxiety, memory problems  Objective:    BP 147/67  Pulse 98  Temp(Src) 98.5 F (36.9 C) (Oral)  Ht 5' 4.5" (1.638 m)  Wt 369 lb (167.377 kg)  BMI 62.38 kg/m2 General appearance: alert, cooperative, no distress and morbidly obese Eyes: conjunctivae/corneas clear. PERRL, EOM's intact.  Ears: normal TM's and external ear canals both ears Nose: Nares normal. Septum midline. Mucosa normal. No drainage or sinus tenderness. Throat: lips, mucosa, and tongue normal; teeth and gums normal Neck: no adenopathy, no carotid bruit, no JVD, supple, symmetrical, trachea midline and thyroid not enlarged, symmetric, no tenderness/mass/nodules Lungs: clear to auscultation bilaterally Heart: regular rate and rhythm, S1, S2 normal, no murmur, click, rub or gallop Abdomen: obese, malodorous fluid in umbilicus.  Pelvic: Small less than 1 cm papule noted on left labia majora. The area slightly tender to palpation. The area is nonfluctuant. Extremities: edema 1+ edema in the feet. Pulses: 2+ and symmetric Neurologic: Grossly normal    Assessment:    Healthy female exam. Information about mammogram provided. Information about colonoscopy mailed to the patient.  Plan:     See After Visit Summary for Counseling Recommendations

## 2012-11-07 DIAGNOSIS — Z0000 Encounter for general adult medical examination without abnormal findings: Secondary | ICD-10-CM | POA: Insufficient documentation

## 2012-11-07 DIAGNOSIS — N764 Abscess of vulva: Secondary | ICD-10-CM

## 2012-11-07 HISTORY — DX: Abscess of vulva: N76.4

## 2012-11-07 NOTE — Assessment & Plan Note (Signed)
A: BP slightly elevated P: Increase coreg

## 2012-11-07 NOTE — Assessment & Plan Note (Addendum)
A: boil w/o fluctuance. No evidence of cellulitis P:   If you feel that the areas getting bigger and there is fluid in it please come back so I can reevaluate and possibly drain the fluid.

## 2012-11-07 NOTE — Assessment & Plan Note (Signed)
A: weight continues to trend up. No SOB. P: Increase coreg to 12.5 mg BID Salt restriction LE compression hose.

## 2012-11-07 NOTE — Assessment & Plan Note (Signed)
A: due for mammogram and colonoscopy. Pap due next year. P: Handout for mammogram and colonoscopy provided.

## 2012-11-12 ENCOUNTER — Telehealth: Payer: Self-pay | Admitting: Family Medicine

## 2012-11-12 MED ORDER — FERROUS SULFATE 300 (60 FE) MG/5ML PO SYRP: 300.0000 mg | ORAL_SOLUTION | Freq: Every day | ORAL | Status: DC

## 2012-11-12 NOTE — Telephone Encounter (Signed)
Patient's sister (care provider) called. Patient with IDA.  Will replete with oral iron sulfate solution.   Mix with 1/2 glass of orange juice 30 minutes before breakfast.

## 2012-12-03 ENCOUNTER — Ambulatory Visit (INDEPENDENT_AMBULATORY_CARE_PROVIDER_SITE_OTHER): Payer: Medicare Other | Admitting: Family Medicine

## 2012-12-03 ENCOUNTER — Telehealth: Payer: Self-pay | Admitting: *Deleted

## 2012-12-03 ENCOUNTER — Encounter: Payer: Self-pay | Admitting: Family Medicine

## 2012-12-03 ENCOUNTER — Ambulatory Visit: Payer: Medicare Other | Admitting: *Deleted

## 2012-12-03 VITALS — BP 137/93 | HR 92

## 2012-12-03 VITALS — BP 148/84 | HR 101 | Temp 98.6°F | Wt 379.0 lb

## 2012-12-03 DIAGNOSIS — M25559 Pain in unspecified hip: Secondary | ICD-10-CM

## 2012-12-03 DIAGNOSIS — M25551 Pain in right hip: Secondary | ICD-10-CM

## 2012-12-03 DIAGNOSIS — I5042 Chronic combined systolic (congestive) and diastolic (congestive) heart failure: Secondary | ICD-10-CM

## 2012-12-03 HISTORY — DX: Pain in unspecified hip: M25.559

## 2012-12-03 MED ORDER — MEDICAL COMPRESSION SOCKS MISC: 1.0000 [IU] | Freq: Every day | Status: DC

## 2012-12-03 MED ORDER — TRAMADOL HCL 50 MG PO TABS: 50.0000 mg | ORAL_TABLET | Freq: Three times a day (TID) | ORAL | Status: DC | PRN

## 2012-12-03 MED ORDER — FUROSEMIDE 20 MG PO TABS: 20.0000 mg | ORAL_TABLET | Freq: Every day | ORAL | Status: DC

## 2012-12-03 NOTE — Progress Notes (Signed)
Subjective:     Patient ID: Lauren Wood, female   DOB: February 02, 1961, 52 y.o.   MRN: [id number]  HPI This is a 52 yo woman w/ Hx of CHF, HTN, and morbid obesity presenting increased BP and right thigh pain. She is present with her sister, who helps take care of her. Hx largely obtained from pt.   Increased BP Pt originally c/o pain while at work which prompted a BP measurement at 237/149 mmHg with pulse of 149 bpm. The BP cuff was reported to not be large and was measured automatically. She was scheduled for an afternoon appt and came into the office around lunch time to receive a repeat BP measuring at 137/93 mmHg with pulse of 92 bpm. Pt decided to keep appt for her pain.   Thigh pain Pt has been experiencing right anterior thigh pain near her inguinal region for the past 2 days. She describes the pain as sharp, that is exacerbated by walking and wt bearing. Pain is relieved by rest. She has not tried any medications for the pain. Pain was the worst yesterday when it began rating 10/10 on pain scale. Today it is slightly better being 9/10. She denies any pain or radiation elsewhere in her body.   Pt cites stress stemming from anxiety in having her family members worry about her and her pain. States that she dislikes going to the doctor and often tries to deal with the pain w/o letting anyone know to avoid going to the office/hospital.    Review of Systems General: Denies fevers, chills, HA, dizziness, light-headedness, N/V, fatigue  Eyes: No changes in vision  Ears: No changes in  hearing  CV/Pulm: Denies chest pain, SOB, swelling/tingling/numbness in extremities. She reports needing 2 pillows to go to sleep at night.   GI: Denies stomach upset. Loose BMs for past two days but no increase/decrease in freq, change in color, or blood in stool. Pt reports decreased appetite that is normal for her baseline.   GU: Denies change in urinary freq, color, or any dysuria.      Objective:   Physical Exam BP 148/84  Pulse 101  Temp(Src) 98.6 F (37 C) (Oral)  Wt 379 lb (171.913 kg)  BMI 64.07 kg/m2  SpO2 95%  LMP 11/26/2012 General appearance: NAD. A&Ox3. Speaks in full sentences and answers questions appropriately. cooperative and morbidly obese Eyes: No scleral icterus or conjunctival pallor. PERRL. EOM intact Ears: Pt has hearing aid in left ear.  Lungs: clear to auscultation bilaterally. Increased breathing and expressed discomfort on laying pt supine for exam. Table elevated 30 degrees to alleviate discomfort. Heart: regular rate and rhythm, S1, S2 normal, no murmur, click, rub or gallop Extremities: Extremities normal and atraumatic with no cyanosis. 3+ pitting edema present bilaterally. Strength 5/5 bilaterally in lower extremities. No pain bilaterally on hip flexion/extension, abd/adduction, internal/external rotation, nor on leg flexion/extiension. Tenderness elicited on deep palpation in region where pt cites pain (hip near inguinal region). Pulses: 2+ and symmetric at radial and dorsalis pedis. Skin: No erythema or warmth in area of pain on right thigh. No notable sores or lesions. Skin in intertriginous area is hyperpigmented.  MSK: FROM of legs bilat. TTP of hip joint but exam limited secondary to body habitus. Hip Flexion, extension, internal and external rotation w/o exacerbation of pain. Sensation intact in LE bilat.   Wt gain of 10 lbs from 11/07/2011 SpO2 sitting - 97%; supine - 95% on room air BP taken with thigh cuff during  encounter: 148/84 mmHg  Problem List: 1. Thigh Pain 2. Wt gain 3. CHF 4. HTN 5. Edema    Assessment:     This is a 52 yo woman w/ Hx of CHF, HTN, and morbid obesity presenting w/ wt gain, edema, and increased BP right thigh pain suggestive of hip flexor strain.     Plan:     1. Thigh pain - Differential for pain originating near the hip include hip flexor strain, hip fracture, hip joint infxn, possible hernia, or cellulitis of  overlying skin. Lack of GI and dermatologic findings make hernia and cellulitis highly unlikely. No erythema or constitutional symptoms to warrant infxn work-up. Additionally, lack of pain in full ROM movement and full and equal strength in both lower extremities makes fracture less likely, leaving muscle strain. Tenderness on deep palpation of area is also consistent with muscle strain. Treatment will consist of Tramadol 50 mg q8hrs PRN for hip pain. Pt given handout on exercises to strengthen and stretch hip muscles. She has also been advised to rest hip and apply heat.   2. CHF, wt gain, HTN, Edema - Pt's hx of CHF and HTN along with recent 10 lb wt gain and edema with possible respiratory discomfort is concerning for CHF exacerbation caused by fluid overload. She will be placed on Lasix 20 mg qday to help rid of excess fluid. Additionally, she will be given compression stockings to use daily. A comprehensive metabolic panel ordered to measure electrolytes and kidney function. Will f/u at 2pm on Tuesday the 29th.      Donnal Moat Medical Student  ------------------------------------------------------ Addendum. Pt seen and examined w/ Medical Student Donnal Moat. Changes made to the above HPI and physical exam w/ A/P in problem list.   Shelly Flatten, MD Family Medicine PGY-3 12/04/2012, 2:25 PM

## 2012-12-03 NOTE — Telephone Encounter (Signed)
See visit info for bp check. Wyatt Haste, RN-BSN

## 2012-12-03 NOTE — Patient Instructions (Addendum)
Thank you for coming today You have hip flexor pain and need to rest your hip, apply heat, and use tramadol as needed for pain Please weigh yourself daily and come back at 2pm on Tuesday the 29th for follow up Please take your lasix daily to help get the extra fluid off Please start using compression stockings daily Please call 24/7 with any further concerns.   Heart Failure Heart failure is a condition in which the heart has trouble pumping blood. This means your heart does not pump blood efficiently for your body to work well. In some cases of heart failure, fluid may back up into your lungs or you may have swelling (edema) in your lower legs. Heart failure is a long-term (chronic) condition. It is important for you to take good care of yourself and follow your caregiver's treatment plan. CAUSES   Health conditions:  High blood pressure (hypertension) causes the heart muscle to work harder than normal. When pressure in the blood vessels is high, the heart needs to pump (contract) with more force in order to circulate blood throughout the body. High blood pressure eventually causes the heart to become stiff and weak.  Coronary artery disease (CAD) is the buildup of cholesterol and fat (plaque) in the arteries of the heart. The blockage in the arteries deprives the heart muscle of oxygen and blood. This can cause chest pain and may lead to a heart attack. High blood pressure can also contribute to CAD.  Heart attack (myocardial infarction) occurs when 1 or more arteries in the heart become blocked. The loss of oxygen damages the muscle tissue of the heart. When this happens, part of the heart muscle dies. The injured tissue does not contract as well and weakens the heart's ability to pump blood.  Abnormal heart valves can cause heart failure when the heart valves do not open and close properly. This makes the heart muscle pump harder to keep the blood flowing.  Heart muscle disease  (cardiomyopathy or myocarditis) is damage to the heart muscle from a variety of causes. These can include drug or alcohol abuse, infections, or unknown reasons. These can increase the risk of heart failure.  Lung disease makes the heart work harder because the lungs do not work properly. This can cause a strain on the heart, leading it to fail.  Diabetes increases the risk of heart failure. High blood sugar contributes to high fat (lipid) levels in the blood. Diabetes can also cause slow damage to tiny blood vessels that carry important nutrients to the heart muscle. When the heart does not get enough oxygen and food, it can cause the heart to become weak and stiff. This leads to a heart that does not contract efficiently.  Other diseases can contribute to heart failure. These include abnormal heart rhythms, thyroid problems, and low blood counts (anemia).  Unhealthy behaviors:  Being overweight.  Smoking or chewing tobacco.  Eating foods high in fat and cholesterol.  Eating foods or drinking beverages high in salt.  Abusing drugs or alcohol.  Lacking physical activity. SYMPTOMS  Heart failure symptoms may vary and can be hard to detect. Symptoms may include:  Shortness of breath with activity, such as climbing stairs.  Persistent cough.  Swelling of the feet, ankles, legs, or abdomen.  Unexplained, sudden weight gain.  Difficulty breathing when lying flat (orthopnea).  Waking from sleep because of the need to sit up and get more air.  Rapid heartbeat.  Fatigue and loss of energy.  Feeling lightheaded, dizzy, or close to fainting.  Loss of appetite.  Nausea.  Increased urination during the night (nocturia). DIAGNOSIS  A diagnosis of heart failure is based on your history, symptoms, physical examination, and diagnostic tests. Diagnostic tests for heart failure may include:  Electrocardiography.  Chest X-ray.  Blood tests.  Exercise stress  test.  Echocardiography.  Cardiac angiography.  Radionuclide scans. TREATMENT  Treatment is aimed at managing the symptoms of heart failure. Medicines, behavioral changes, or surgical intervention may be necessary to treat heart failure.  Medicines to help treat heart failure may include:  Angiotensin-converting enzyme (ACE) inhibitors. This type of medicine blocks the effects of a blood protein called angiotensin-converting enzyme. ACE inhibitors relax (dilate) the blood vessels and help lower blood pressure.  Angiotensin receptor blockers. This type of medicine blocks the actions of a blood protein called angiotensin. Angiotensin receptor blockers dilate the blood vessels and help lower blood pressure.  Aldosterone antagonists. This type of medicine helps rid your body of extra fluid. This loss of extra fluid lowers the volume of blood the heart pumps.  Water pills (diuretics). Diuretics cause the kidneys to remove salt and water from the blood. The extra fluid is removed through urination.  Beta blockers. These prevent the heart from beating too fast and improve heart muscle strength.  Digitalis. This increases the force of the heartbeat.  Healthy behavior changes include:  Obtaining and maintaining a healthy weight.  Stopping smoking or chewing tobacco.  Eating heart healthy foods.  Limiting or avoiding alcohol.  Stopping drug use.  Becoming as physically active as possible.  Surgical treatment for heart failure may include:  A procedure to open blocked arteries, repair damaged heart valves, or remove damaged heart muscle tissue.  A pacemaker to improve heart muscle function and control certain abnormal heart rhythms.  An internal cardioverter defibrillator to possibly prevent sudden cardiac death.  A left ventricular assist device to assist the pumping ability of the heart. HOME CARE INSTRUCTIONS   Take your medicine as directed by your caregiver. Medicines are  important in reducing the workload of your heart, slowing the progression of heart failure, and improving your symptoms.  Do not stop taking your medicine unless directed by your caregiver.  Do not skip any dose of medicine.  Refill your prescriptions before you run out of medicine. Your medicines are needed every day.  Take over-the-counter medicine only as directed by your caregiver or pharmacist.  Stay physically active. Your caregiver or cardiac rehabilitation program can help determine your level of physical activity. It is important to follow your specific physical activity program. Regular physical activity can control weight and improve your energy, endurance, health, cholesterol levels, mood, and nighttime sleep. It is important to pace your physical activity to avoid shortness of breath or chest pain. It is recommended to rest for 1 hour before and after meals.  Eat heart healthy foods. Food choices should be low in saturated fat, trans fat, cholesterol, and salt (sodium). Healthy choices include fresh or frozen fruits and vegetables, fish, lean meats, legumes, fat-free or low-fat dairy products, and whole grain or high fiber foods. Limit your sodium to 1500 milligrams (mg) each day or as recommended by your caregiver. Talk to a dietitian to learn more about heart healthy foods and healthy seasonings.  Use healthy cooking methods. Healthy cooking methods include roasting, grilling, broiling, baking, poaching, steaming, or stir-frying. Talk to a dietitian to learn more about healthy cooking methods.  Limit fluids if directed  by your caregiver. Fluid restriction may reduce symptoms of heart failure in some individuals.  Weigh yourself every day. Daily weights are important in the early recognition of excess fluid. You should weigh yourself every morning after you urinate and before you eat breakfast. Wear the same amount of clothing each time you weigh yourself. Record your daily weight.  Provide your caregiver with your weight record.  Monitor and record your blood pressure if directed by your caregiver.  Check your pulse if directed by your caregiver.  If you are overweight, it is time to lose and maintain a healthy weight.  Stop smoking or chewing tobacco. Nicotine makes your heart work harder by causing your blood vessels to constrict. Do not use nicotine gum or patches before talking to your caregiver.  Schedule and attend follow-up visits as directed by your caregiver. It is important to keep all your appointments.  Limit alcohol intake to no more than 1 drink per day for nonpregnant women and 2 drinks per day for men. Drinking more than that is harmful to your heart. Tell your caregiver if you drink alcohol several times a week. Talk with your caregiver about whether alcohol is safe for you. If your heart has already been damaged by alcohol or you have severe heart failure, drinking alcohol should be stopped completely.  Stop illegal drug use.  Stay up-to-date with immunizations. It is especially important to prevent respiratory infections through current pneumococcal and influenza immunizations.  Manage other health conditions such as hypertension, diabetes, thyroid disease, or abnormal heart rhythms as directed by your caregiver.  Learn to manage stress.  Plan rest periods when fatigued.  Learn strategies to manage high temperatures. If the weather is extremely hot:  Avoid vigorous physical activity.  Use air conditioning or fans or seek a cooler location.  Avoid caffeine and alcohol.  Wear loose-fitting, lightweight, and light-colored clothing.  Learn strategies to manage cold temperatures. If the weather is extremely cold:  Avoid vigorous physical activity.  Layer clothes.  Wear mittens or gloves, a hat, and a scarf when going outside.  Avoid alcohol.  Obtain ongoing education and support as needed.  Participate or seek rehabilitation as needed  to maintain or improve independence and quality of life. SEEK MEDICAL CARE IF:   Your weight increases by 3 lb/1.4 kg in 1 day or 5 lb/2.3 kg in a week.  You have increasing shortness of breath that is unusual for you.  You are unable to participate in your usual physical activities.  You tire easily.  You cough more than normal, especially with physical activity.  You have any or more swelling in areas such as your hands, feet, ankles, or abdomen.  You are unable to sleep because it is hard to breathe.  You cough up bloody mucus (sputum).  You feel like your heart is beating fast (palpitations).  You become dizzy or lightheaded upon standing up. SEEK IMMEDIATE MEDICAL CARE IF:   You have difficulty breathing.  There is a change in mental status such as decreased alertness or difficulty with concentration.  You have a pain or discomfort in your chest.  You have an episode of fainting (syncope).  You are in an area of high temperatures and you have signs of heat exhaustion:  Heavy sweating.  Muscle cramps.  Weakness.  Dizziness.  Headaches.  Fainting.  You are in an area of cold temperatures and you have signs of hypothermia:  Clumsiness.  Confusion.  Sleepiness.  Shivering. MAKE SURE  YOU:   Understand these instructions.  Will watch your condition.  Will get help right away if you are not doing well or get worse. Document Released: 05/01/2005 Document Revised: 04/17/2012 Document Reviewed: 11/30/2011 [facility] Patient Information 2014 [place], [place].  Hip Exercises RANGE OF MOTION (ROM) AND STRETCHING EXERCISES  These exercises may help you when beginning to rehabilitate your injury. Doing them too aggressively can worsen your condition. Complete them slowly and gently. Your symptoms may resolve with or without further involvement from your physician, physical therapist or athletic trainer. While completing these exercises, remember:   Restoring  tissue flexibility helps normal motion to return to the joints. This allows healthier, less painful movement and activity.  An effective stretch should be held for at least 30 seconds.  A stretch should never be painful. You should only feel a gentle lengthening or release in the stretched tissue. If these stretches worsen your symptoms even when done gently, consult your physician, physical therapist or athletic trainer. STRETCH Hamstrings, Supine   Lie on your back. Loop a belt or towel over the ball of your right / left foot.  Straighten your right / left knee and slowly pull on the belt to raise your leg. Do not allow the right / left knee to bend. Keep your opposite leg flat on the floor.  Raise the leg until you feel a gentle stretch behind your right / left knee or thigh. Hold this position for __________ seconds. Repeat __________ times. Complete this stretch __________ times per day.  STRETCH - Hip Rotators   Lie on your back on a firm surface. Grasp your right / left knee with your right / left hand and your ankle with your opposite hand.  Keeping your hips and shoulders firmly planted, gently pull your right / left knee and rotate your lower leg toward your opposite shoulder until you feel a stretch in your buttocks.  Hold this stretch for __________ seconds. Repeat this stretch __________ times. Complete this stretch __________ times per day. STRETCH - Hamstrings/Adductors, V-Sit   Sit on the floor with your legs extended in a large "V," keeping your knees straight.  With your head and chest upright, bend at your waist reaching for your right foot to stretch your left adductors.  You should feel a stretch in your left inner thigh. Hold for __________ seconds.  Return to the upright position to relax your leg muscles.  Continuing to keep your chest upright, bend straight forward at your waist to stretch your hamstrings.  You should feel a stretch behind both of your thighs  and/or knees. Hold for __________ seconds.  Return to the upright position to relax your leg muscles.  Repeat steps 2 through 4 for opposite leg. Repeat __________ times. Complete this exercise __________ times per day.  STRETCHING - Hip Flexors, Lunge  Half kneel with your right / left knee on the floor and your opposite knee bent and directly over your ankle.  Keep good posture with your head over your shoulders. Tighten your buttocks to point your tailbone downward; this will prevent your back from arching too much.  You should feel a gentle stretch in the front of your thigh and/or hip. If you do not feel any resistance, slightly slide your opposite foot forward and then slowly lunge forward so your knee once again lines up over your ankle. Be sure your tailbone remains pointed downward.  Hold this stretch for __________ seconds. Repeat __________ times. Complete this stretch __________  times per day. STRENGTHENING EXERCISES These exercises may help you when beginning to rehabilitate your injury. They may resolve your symptoms with or without further involvement from your physician, physical therapist or athletic trainer. While completing these exercises, remember:   Muscles can gain both the endurance and the strength needed for everyday activities through controlled exercises.  Complete these exercises as instructed by your physician, physical therapist or athletic trainer. Progress the resistance and repetitions only as guided.  You may experience muscle soreness or fatigue, but the pain or discomfort you are trying to eliminate should never worsen during these exercises. If this pain does worsen, stop and make certain you are following the directions exactly. If the pain is still present after adjustments, discontinue the exercise until you can discuss the trouble with your clinician. STRENGTH - Hip Extensors, Bridge   Lie on your back on a firm surface. Bend your knees and place  your feet flat on the floor.  Tighten your buttocks muscles and lift your bottom off the floor until your trunk is level with your thighs. You should feel the muscles in your buttocks and back of your thighs working. If you do not feel these muscles, slide your feet 1-2 inches further away from your buttocks.  Hold this position for __________ seconds.  Slowly lower your hips to the starting position and allow your buttock muscles relax completely before beginning the next repetition.  If this exercise is too easy, you may cross your arms over your chest. Repeat __________ times. Complete this exercise __________ times per day.  STRENGTH - Hip Abductors, Straight Leg Raises  Be aware of your form throughout the entire exercise so that you exercise the correct muscles. Sloppy form means that you are not strengthening the correct muscles.  Lie on your side so that your head, shoulders, knee and hip line up. You may bend your lower knee to help maintain your balance. Your right / left leg should be on top.  Roll your hips slightly forward, so that your hips are stacked directly over each other and your right / left knee is facing forward.  Lift your top leg up 4-6 inches, leading with your heel. Be sure that your foot does not drift forward or that your knee does not roll toward the ceiling.  Hold this position for __________ seconds. You should feel the muscles in your outer hip lifting (you may not notice this until your leg begins to tire).  Slowly lower your leg to the starting position. Allow the muscles to fully relax before beginning the next repetition. Repeat __________ times. Complete this exercise __________ times per day.  STRENGTH - Hip Adductors, Straight Leg Raises   Lie on your side so that your head, shoulders, knee and hip line up. You may place your upper foot in front to help maintain your balance. Your right / left leg should be on the bottom.  Roll your hips slightly  forward, so that your hips are stacked directly over each other and your right / left knee is facing forward.  Tense the muscles in your inner thigh and lift your bottom leg 4-6 inches. Hold this position for __________ seconds.  Slowly lower your leg to the starting position. Allow the muscles to fully relax before beginning the next repetition. Repeat __________ times. Complete this exercise __________ times per day.  STRENGTH - Quadriceps, Straight Leg Raises  Quality counts! Watch for signs that the quadriceps muscle is working to insure you  are strengthening the correct muscles and not "cheating" by substituting with healthier muscles.  Lay on your back with your right / left leg extended and your opposite knee bent.  Tense the muscles in the front of your right / left thigh. You should see either your knee cap slide up or increased dimpling just above the knee. Your thigh may even quiver.  Tighten these muscles even more and raise your leg 4 to 6 inches off the floor. Hold for right / left seconds.  Keeping these muscles tense, lower your leg.  Relax the muscles slowly and completely in between each repetition. Repeat __________ times. Complete this exercise __________ times per day.  STRENGTH - Hip Abductors, Standing  Tie one end of a rubber exercise band/tubing to a secure surface (table, pole) and tie a loop at the other end.  Place the loop around your right / left ankle. Keeping your ankle with the band directly opposite of the secured end, step away until there is tension in the tube/band.  Hold onto a chair as needed for balance.  Keeping your back upright, your shoulders over your hips, and your toes pointing forward, lift your right / left leg out to your side. Be sure to lift your leg with your hip muscles. Do not "throw" your leg or tip your body to lift your leg.  Slowly and with control, return to the starting position. Repeat exercise __________ times. Complete this  exercise __________ times per day.  STRENGTH  Quadriceps, Squats  Stand in a door frame so that your feet and knees are in line with the frame.  Use your hands for balance, not support, on the frame.  Slowly lower your weight, bending at the hips and knees. Keep your lower legs upright so that they are parallel with the door frame. Squat only within the range that does not increase your knee pain. Never let your hips drop below your knees.  Slowly return upright, pushing with your legs, not pulling with your hands. Document Released: 05/19/2005 Document Revised: 07/24/2011 Document Reviewed: 08/13/2008 [facility] Patient Information 2014 [place], [place].

## 2012-12-03 NOTE — Telephone Encounter (Signed)
Sister calling to ask if 237/149 with pulse of 149 is high and if she needs to be seen. Verified that BP was not taken with large cuff nor was it taken manually. Advised that it was an extremely high reading but probably not accurate due to cuff size and machine. States that sister said she hurt all over. Advised to come in for nurse visit to check BP and /or office visit today since pt has hard time communicating complaints. Scheduled 315 appointment for today and reminded pt to come in for nurse visit anytime to check BP. Sister verbalized understanding. Wyatt Haste, RN-BSN

## 2012-12-03 NOTE — Assessment & Plan Note (Signed)
R hip pain likely from hip flexor pain.  Resolving already Tramadol PRN pain Low concern for fracture Rest, Ice/Heat, exercises

## 2012-12-03 NOTE — Progress Notes (Signed)
Pt here with sister for BP check - states that her lower right side is hurting and she has had 2 loose stools "they aren't that bad , I wanna go back to work". BP is 137/93 with large cuff and pulse of 92. Pt has appointment this afternoon for right side pain. Suggested motrin for pain relief - sister verbalized understanding. Wyatt Haste, RN-BSN

## 2012-12-03 NOTE — Assessment & Plan Note (Addendum)
Concern for progressing CHF. 10lb wt gain in 1 month. Breathing comfortably today in clinic w/ O2 sats in the mid 90s sitting and standing Will start on Lasix w/ BMET today Compression stockings ordered Would recommend this pt be seen by Cardiology (preferably CHF team) w/ poor insite secondary to MR and w/ EF of 35%, but will defer decision to PCP Precautions given  Sister updated who is caregiver Daily wts  BMET w/ excellent Cr fxn and nml K

## 2012-12-04 LAB — COMPREHENSIVE METABOLIC PANEL
ALT: <8 U/L (ref 0–35)
AST: 12 U/L (ref 0–37)
Albumin: 3.7 g/dL (ref 3.5–5.2)
Alkaline Phosphatase: 138 U/L — ABNORMAL HIGH (ref 39–117)
BUN: 15 mg/dL (ref 6–23)
CO2: 27 mEq/L (ref 19–32)
Calcium: 9.8 mg/dL (ref 8.4–10.5)
Chloride: 103 mEq/L (ref 96–112)
Creat: 0.97 mg/dL (ref 0.50–1.10)
Glucose, Bld: 106 mg/dL — ABNORMAL HIGH (ref 70–99)
Potassium: 3.9 mEq/L (ref 3.5–5.3)
Sodium: 140 mEq/L (ref 135–145)
Total Bilirubin: 0.3 mg/dL (ref 0.3–1.2)
Total Protein: 6.9 g/dL (ref 6.0–8.3)

## 2012-12-05 ENCOUNTER — Ambulatory Visit
Admission: RE | Admit: 2012-12-05 | Discharge: 2012-12-05 | Disposition: A | Discharge status: Home / Self Care | Payer: Medicare Other | Source: Ambulatory Visit

## 2012-12-05 DIAGNOSIS — Z1231 Encounter for screening mammogram for malignant neoplasm of breast: Secondary | ICD-10-CM

## 2012-12-10 ENCOUNTER — Encounter: Payer: Self-pay | Admitting: Family Medicine

## 2012-12-10 ENCOUNTER — Ambulatory Visit (INDEPENDENT_AMBULATORY_CARE_PROVIDER_SITE_OTHER): Payer: Medicare Other | Admitting: Family Medicine

## 2012-12-10 VITALS — BP 142/84 | HR 97 | Temp 98.4°F | Ht 64.5 in | Wt 366.0 lb

## 2012-12-10 DIAGNOSIS — G4733 Obstructive sleep apnea (adult) (pediatric): Secondary | ICD-10-CM

## 2012-12-10 DIAGNOSIS — I10 Essential (primary) hypertension: Secondary | ICD-10-CM

## 2012-12-10 DIAGNOSIS — I5042 Chronic combined systolic (congestive) and diastolic (congestive) heart failure: Secondary | ICD-10-CM

## 2012-12-10 DIAGNOSIS — I509 Heart failure, unspecified: Secondary | ICD-10-CM

## 2012-12-10 LAB — BASIC METABOLIC PANEL
BUN: 14 mg/dL (ref 6–23)
CO2: 30 mEq/L (ref 19–32)
Calcium: 9.5 mg/dL (ref 8.4–10.5)
Chloride: 100 mEq/L (ref 96–112)
Creat: 1.16 mg/dL — ABNORMAL HIGH (ref 0.50–1.10)
Glucose, Bld: 81 mg/dL (ref 70–99)
Potassium: 4.1 mEq/L (ref 3.5–5.3)
Sodium: 138 mEq/L (ref 135–145)

## 2012-12-10 NOTE — Patient Instructions (Signed)
1. For your history of heart failure, keep taking the lasix 20mg .   *We are going to check a lab today to make sure your potassium and kidneys are ok. For your labs, I will send you a letter if there are no medication changes needed. I will call you if we need to discuss your lab results.   *I am going to refer you to a heart doctor  *get the compression stockings  2. For your sleep apnea, we are trying to see if advanced home care can help get you set back up. You may have to have another sleep study.   Check in with Dr. Armen Pickup sometime in the next month,  Dr. Durene Cal  You need the following: Health Maintenance Due  Topic Date Due  . Colonoscopy  03/16/2010  . Tetanus/tdap  11/13/2011

## 2012-12-11 DIAGNOSIS — G4733 Obstructive sleep apnea (adult) (pediatric): Secondary | ICD-10-CM

## 2012-12-11 NOTE — Assessment & Plan Note (Addendum)
Slightly elevated SBP-will try to get patient back on CPAP for now and monitor.

## 2012-12-11 NOTE — Assessment & Plan Note (Signed)
Sleep study ordered. Hope to get patient back on CPAP.

## 2012-12-11 NOTE — Assessment & Plan Note (Addendum)
Discussed with Dr. Armen Pickup who prefers to have patient established with cardiology. Last echo 2012 and may be worth reassessing as well. Fluid status much improved today and asymptomatic patient. Will continue low level lasix. Patient already has ARB and beta blocker on board.  BMET with stable kidney function and electrolytes on lasix.

## 2012-12-11 NOTE — Progress Notes (Signed)
  [facility] Tana Conch, MD Phone: [phone]  Subjective:   # CHF ( systolic and diastolic CHF with last echo on 09/08/10 with EF 35-40%) follow up and HTN Seen on 7/22 by Dr. Konrad Dolores. Weight up 10lb in 1 month at that time. Patient with limited activity at baseline but there was a ? SOB and had 3+ edema. She was started on lasix 20mg  and told to follow up today.   Weight today is down 13 lbs from last visit. Patient remains comfortable breathing and has noted no change. No chest pain or shortness of breath. Swelling in legs has improved. Did not get compression stockings (no rx given reportedly). Decision was made to defer referral to PCP for cards follow up but patient has changed PCP back to Dr. Armen Pickup at this time. Patient taking carvedilol, losartan and lasix.   Denies any CP, lightheadedness, SOB, blurry vision, worsened LE edema, transient weakness, orthopnea, PND.   ROS--See HPI  Past Medical History-systolic and diastolic CHF with last echo on 09/08/10 with EF 35-40% (I dont see this in epic though). Patient without cardiologist. 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 Hypertension. Mental retardation. Psychiatric disorder NOS (borderline mentioned in problem list)  # OSA Morbidly obese. Heavy snorer. Has not used CPAP in 5 years and not sure if it is still working. SIster and patient have agreed to work to use this again.   Reviewed problem list.  Medications- reviewed and updated Chief complaint-noted  Objective: BP 142/84  Pulse 97  Temp(Src) 98.4 F (36.9 C) (Oral)  Ht 5' 4.5" (1.638 m)  Wt 366 lb (166.017 kg)  BMI 61.88 kg/m2  LMP 11/26/2012 Gen: NAD, resting comfortably, morbidly obese CV: RRR no murmurs rubs or gallops, no JVD Lungs: CTAB no crackles, wheeze, rhonchi Skin: warm, dry Neuro: grossly normal, moves all extremities Ext: 1+ edema bilaterally (improved from 3+)  Assessment/Plan:  Of note, hip pain/thigh pain resolved  with analgesics and exercises and has not used meds or had pain over last 36 hours.

## 2012-12-12 ENCOUNTER — Telehealth: Payer: Self-pay | Admitting: *Deleted

## 2012-12-12 NOTE — Telephone Encounter (Signed)
Spoke with patients caregiver, her sister, [name] and gave lab results per MD orders.  Carron Mcmurry, Darlyne Russian, CMA

## 2012-12-12 NOTE — Telephone Encounter (Signed)
Message copied by Radene Ou on Thu Dec 12, 2012 11:06 AM ------      Message from: Shelva Majestic      Created: Wed Dec 11, 2012  9:11 PM       Kidney function stable near baseline on lasix. Electrolytes also normal. Please inform patient. ------

## 2012-12-17 ENCOUNTER — Telehealth: Payer: Self-pay | Admitting: Family Medicine

## 2012-12-17 NOTE — Telephone Encounter (Signed)
Form dropped off to be filled out for [employer].  Please call when completed.

## 2012-12-19 NOTE — Telephone Encounter (Signed)
[employer] form filled out and placed  Up frontfor pickup. The patient's sister and guardian Annais Crafts called.

## 2013-01-08 ENCOUNTER — Ambulatory Visit (HOSPITAL_BASED_OUTPATIENT_CLINIC_OR_DEPARTMENT_OTHER): Payer: Medicare Other | Attending: Family Medicine | Admitting: Radiology

## 2013-01-08 VITALS — Ht 63.0 in

## 2013-01-08 DIAGNOSIS — Z9989 Dependence on other enabling machines and devices: Secondary | ICD-10-CM

## 2013-01-08 DIAGNOSIS — R0609 Other forms of dyspnea: Secondary | ICD-10-CM | POA: Insufficient documentation

## 2013-01-08 DIAGNOSIS — R0989 Other specified symptoms and signs involving the circulatory and respiratory systems: Secondary | ICD-10-CM | POA: Insufficient documentation

## 2013-01-08 DIAGNOSIS — G4733 Obstructive sleep apnea (adult) (pediatric): Secondary | ICD-10-CM | POA: Insufficient documentation

## 2013-01-10 ENCOUNTER — Ambulatory Visit (INDEPENDENT_AMBULATORY_CARE_PROVIDER_SITE_OTHER): Payer: Medicare Other | Admitting: Family Medicine

## 2013-01-10 VITALS — BP 138/85 | HR 94 | Temp 99.1°F | Ht 64.5 in | Wt 370.0 lb

## 2013-01-10 DIAGNOSIS — N189 Chronic kidney disease, unspecified: Secondary | ICD-10-CM

## 2013-01-10 DIAGNOSIS — G4733 Obstructive sleep apnea (adult) (pediatric): Secondary | ICD-10-CM

## 2013-01-10 DIAGNOSIS — I5042 Chronic combined systolic (congestive) and diastolic (congestive) heart failure: Secondary | ICD-10-CM

## 2013-01-10 MED ORDER — MEDICAL COMPRESSION SOCKS MISC: 1.0000 [IU] | Freq: Every day | Status: DC

## 2013-01-10 NOTE — Patient Instructions (Addendum)
Roosevelt,  Thank you for coming in today. We will get some blood work now.   Please get the compression stockings.  Put them on first thing in the morning to prevent swelling.  Please keep appointment with Dr. Martorell Lions. He will coordinate the repeat ECHO.    Plan to see me in 4 weeks.   Dr. Armen Pickup

## 2013-01-11 LAB — BASIC METABOLIC PANEL
BUN: 14 mg/dL (ref 6–23)
CO2: 30 mEq/L (ref 19–32)
Calcium: 9 mg/dL (ref 8.4–10.5)
Chloride: 102 mEq/L (ref 96–112)
Creat: 1.15 mg/dL — ABNORMAL HIGH (ref 0.50–1.10)
Glucose, Bld: 100 mg/dL — ABNORMAL HIGH (ref 70–99)
Potassium: 3.6 mEq/L (ref 3.5–5.3)
Sodium: 139 mEq/L (ref 135–145)

## 2013-01-14 ENCOUNTER — Encounter: Payer: Self-pay | Admitting: Family Medicine

## 2013-01-14 ENCOUNTER — Other Ambulatory Visit: Payer: Self-pay | Admitting: *Deleted

## 2013-01-14 MED ORDER — LOSARTAN POTASSIUM 25 MG PO TABS: 25.0000 mg | ORAL_TABLET | Freq: Every day | ORAL | Status: DC

## 2013-01-14 NOTE — Assessment & Plan Note (Addendum)
A: compensated on lasix. Cr and potassium wnl.  P: Encouraged patient and her sister to exercise, her sister is active in a gym and losing significant weight s/p gastric bypass surgery  Continue current medication.  Provided rx for compression stockings.

## 2013-01-14 NOTE — Assessment & Plan Note (Signed)
Sleep completed two days ago. Will await report, I anticipate that the patient will be started back on CPAP.

## 2013-01-14 NOTE — Progress Notes (Signed)
Subjective:     Patient ID: Lauren Wood, female   DOB: 14-Jan-1961, 52 y.o.   MRN: [id number]  HPI 52 yo F with history of borderline personality disorder, morbid obesity, CKD  and sleep apnea presents with her sister for f/u. She has no complaints.   1. CHF: compliant with lasix. Taking 40 mg BID. Also compliant with coreg  and losartan. Patient denies CP, SOB, orthopnea, LE edema. She has stopped exercising at her work/day program.   2.sleep apnea: has repeat sleep study completed.   Review of Systems As per HPI     Objective:   Physical Exam BP 138/85  Pulse 94  Temp(Src) 99.1 F (37.3 C) (Oral)  Ht 5' 4.5" (1.638 m)  Wt 370 lb (167.831 kg)  BMI 62.55 kg/m2 Wt Readings from Last 3 Encounters:  01/10/13 370 lb (167.831 kg)  12/10/12 366 lb (166.017 kg)  12/03/12 379 lb (171.913 kg)  General appearance: alert, cooperative, no distress and morbidly obese Lungs: clear to auscultation bilaterally Heart: regular rate and rhythm, S1, S2 normal, no murmur, click, rub or gallop Extremities: edema trace LE edema bilaterally     Chemistry      Component Value Date/Time   NA 139 01/10/2013 1511   K 3.6 01/10/2013 1511   CL 102 01/10/2013 1511   CO2 30 01/10/2013 1511   BUN 14 01/10/2013 1511   CREATININE 1.15* 01/10/2013 1511   CREATININE 1.5* 12/23/2009 1345           Component Value Date/Time   CALCIUM 9.0 01/10/2013 1511                           Assessment and Plan:

## 2013-01-14 NOTE — Assessment & Plan Note (Signed)
Stable Cr and lytes on lasix

## 2013-01-14 NOTE — Assessment & Plan Note (Signed)
A: declined. Patient is no longer participating in regular exercise at her day program. P: Encouraged patient to work out with her sister.

## 2013-01-24 DIAGNOSIS — G4733 Obstructive sleep apnea (adult) (pediatric): Secondary | ICD-10-CM

## 2013-01-25 NOTE — Procedures (Signed)
NAMEMANREET, Lauren Wood               ACCOUNT NO.:  [id number]  MEDICAL RECORD NO.:  [id number]          PATIENT TYPE:  OUT  LOCATION:  SLEEP CENTER                 FACILITY:  [facility]  PHYSICIAN:  Scherrie Seneca D. Maple Hudson, MD, FCCP, FACPDATE OF BIRTH:  02/02/1960  DATE OF STUDY:  01/08/2013                           NOCTURNAL POLYSOMNOGRAM  REFERRING PHYSICIAN:  Tana Conch, MD  INDICATION FOR STUDY:  Hypersomnia with sleep apnea.  EPWORTH SLEEPINESS SCORE:  9/24.  BMI 64.5, weight 364 pounds, height 63 inches, and neck 16.5 inches.  MEDICATIONS:  Home medications are charted for review.  The previous study on December 16, 2006, had recorded AHI 36 per hour. Body weight was recorded 337 pounds.  SLEEP ARCHITECTURE:  Split study protocol.  During the diagnostic phase, total sleep time 123.5 minutes with sleep efficiency 74%.  Stage I was 6.5%, stage II was 93.5%, stages III and REM were absent.  Sleep latency 16.5 minutes.  Awake after sleep onset 18.5 minutes.  Arousal index 36.4.  BEDTIME MEDICATION:  None.  RESPIRATORY DATA:  Split study protocol:  Apnea/hypopnea index (AHI) 146.2 per hour.  A total of 301 events was scored including 37 obstructive apneas, 24 central apneas, 240 hypopneas.  Events were associated with supine sleep position.  CPAP was titrated to 11 CWP, with a residual AHI of 34.3 per hour.  CPAP was not being tolerated by the patient, so the technician changed to bilevel and titrated to a final inspiratory pressure of 15 and expiratory pressure of 10 CWP which left residual central apneas but cleared all obstructive events.  The technician ran out of time and reported the patient is restless, kicking titration difficult.  She wore a large fissure and Fisher and [employer]kel Eson nasal mask with heated humidifier.  OXYGEN DATA:  Snoring was very loud before CPAP with oxygen desaturation to a nadir of 83% on room air.  With CPAP titration snoring was largely prevented and  final mean oxygen saturation was 89.6% on room air. Supplemental oxygen was not provided.  CARDIAC DATA:  Sinus rhythm with frequent PVCs.  MOVEMENT-PARASOMNIA:  No significant movement disturbance period. Bathroom x1.  IMPRESSION/RECOMMENDATION: 1. Severe obstructive and central sleep apnea/hypopnea syndrome, AHI     146.2 per hour with supine events.  Very loud snoring with oxygen     desaturation to a nadir of 83% on room air. 2. CPAP was titrated to 11 CWP with in-adequate control, residual AHI     of 34.3 per hour.  She was not comfortable with CPAP.  She was     changed to bilevel with a final inspiratory pressure of 15 and     expiratory pressure of 10 CWP.  This cleared all obstructive     events, but left residual central apneas calculated at an AHI of 19     per hour during 12 minutes of recording time at that pressure.  She     wore a large Fisher and Paykel Eson nasal mask with heated     humidifier.  Time constraint, and the patient restlessness limited     technician debilitated to titrate.  Consider return for dedicated  CPAP titration study if appropriate.  Otherwise, recommend trying     home bilevel inspiratory 15 and expiratory 10.  Consider overnight     oximetry with CPAP on room air to establish whether supplemental     oxygen should also be provided.     Layali Freund D. Maple Hudson, MD, [facility], FACP Diplomate, American Board of Sleep Medicine    CDY/MEDQ  D:  01/24/2013 20:47:51  T:  01/25/2013 06:14:40  Job:  [id number]

## 2013-01-28 ENCOUNTER — Ambulatory Visit (INDEPENDENT_AMBULATORY_CARE_PROVIDER_SITE_OTHER): Payer: Medicare Other | Admitting: Cardiology

## 2013-01-28 ENCOUNTER — Encounter: Payer: Self-pay | Admitting: Cardiology

## 2013-01-28 VITALS — BP 151/90 | HR 90 | Ht 64.5 in | Wt 372.0 lb

## 2013-01-28 DIAGNOSIS — R0602 Shortness of breath: Secondary | ICD-10-CM

## 2013-01-28 DIAGNOSIS — I10 Essential (primary) hypertension: Secondary | ICD-10-CM

## 2013-01-28 DIAGNOSIS — R5383 Other fatigue: Secondary | ICD-10-CM

## 2013-01-28 DIAGNOSIS — R5381 Other malaise: Secondary | ICD-10-CM

## 2013-01-28 LAB — TSH: TSH: 2.34 u[IU]/mL (ref 0.35–5.50)

## 2013-01-28 LAB — BRAIN NATRIURETIC PEPTIDE: Pro B Natriuretic peptide (BNP): 51 pg/mL (ref 0.0–100.0)

## 2013-01-28 MED ORDER — LOSARTAN POTASSIUM 25 MG PO TABS: 25.0000 mg | ORAL_TABLET | Freq: Two times a day (BID) | ORAL | Status: DC

## 2013-01-28 NOTE — Patient Instructions (Addendum)
Please increase your Cozaar/Losartan to 25 mg one tablet twice a day. Continue all other medications as listed.  Please have blood work today. (BNP and TSH)  Your physician has requested that you have an echocardiogram. Echocardiography is a painless test that uses sound waves to create images of your heart. It provides your doctor with information about the size and shape of your heart and how well your heart's chambers and valves are working. This procedure takes approximately one hour. There are no restrictions for this procedure.  Follow up as needed.  Calorie Counting Diet A calorie counting diet requires you to eat the number of calories that are right for you in a day. Calories are the measurement of how much energy you get from the food you eat. Eating the right amount of calories is important for staying at a healthy weight. If you eat too many calories, your body will store them as fat and you may gain weight. If you eat too few calories, you may lose weight. Counting the number of calories you eat during a day will help you know if you are eating the right amount. A Registered Dietitian can determine how many calories you need in a day. The amount of calories needed varies from person to person. If your goal is to lose weight, you will need to eat fewer calories. Losing weight can benefit you if you are overweight or have health problems such as heart disease, high blood pressure, or diabetes. If your goal is to gain weight, you will need to eat more calories. Gaining weight may be necessary if you have a certain health problem that causes your body to need more energy. TIPS Whether you are increasing or decreasing the number of calories you eat during a day, it may be hard to get used to changes in what you eat and drink. The following are tips to help you keep track of the number of calories you eat.  Measure foods at home with measuring cups. This helps you know the amount of food and  number of calories you are eating.  Restaurants often serve food in amounts that are larger than 1 serving. While eating out, estimate how many servings of a food you are given. For example, a serving of cooked rice is  cup or about the size of half of a fist. Knowing serving sizes will help you be aware of how much food you are eating at restaurants.  Ask for smaller portion sizes or child-size portions at restaurants.  Plan to eat half of a meal at a restaurant. Take the rest home or share the other half with a friend.  Read the Nutrition Facts panel on food labels for calorie content and serving size. You can find out how many servings are in a package, the size of a serving, and the number of calories each serving has.  For example, a package might contain 3 cookies. The Nutrition Facts panel on that package says that 1 serving is 1 cookie. Below that, it will say there are 3 servings in the container. The calories section of the Nutrition Facts label says there are 90 calories. This means there are 90 calories in 1 cookie (1 serving). If you eat 1 cookie you have eaten 90 calories. If you eat all 3 cookies, you have eaten 270 calories (3 servings x 90 calories = 270 calories). The list below tells you how big or small some common portion sizes are.  1 oz.........4  stacked dice.  3 oz........Marland KitchenDeck of cards.  1 tsp.......Marland KitchenTip of little finger.  1 tbs......Marland KitchenMarland KitchenThumb.  2 tbs.......Marland KitchenGolf ball.   cup......Marland KitchenHalf of a fist.  1 cup.......Marland KitchenA fist. KEEP A FOOD LOG Write down every food item you eat, the amount you eat, and the number of calories in each food you eat during the day. At the end of the day, you can add up the total number of calories you have eaten. It may help to keep a list like the one below. Find out the calorie information by reading the Nutrition Facts panel on food labels. Breakfast  Bran cereal (1 cup, 110 calories).  Fat-free milk ( cup, 45 calories). Snack  Apple  (1 medium, 80 calories). Lunch  Spinach (1 cup, 20 calories).  Tomato ( medium, 20 calories).  Chicken breast strips (3 oz, 165 calories).  Shredded cheddar cheese ( cup, 110 calories).  Light [place] dressing (2 tbs, 60 calories).  Whole-wheat bread (1 slice, 80 calories).  Tub margarine (1 tsp, 35 calories).  Vegetable soup (1 cup, 160 calories). Dinner  Pork chop (3 oz, 190 calories).  Brown rice (1 cup, 215 calories).  Steamed broccoli ( cup, 20 calories).  Strawberries (1  cup, 65 calories).  Whipped cream (1 tbs, 50 calories). Daily Calorie Total: 1425 Document Released: 05/01/2005 Document Revised: 07/24/2011 Document Reviewed: 10/26/2006 [facility] Patient Information 2014 [place], [place].

## 2013-01-28 NOTE — Progress Notes (Signed)
HPI The patient presents as a new patient for evaluation of possible heart failure. She has never had prior cardiac history. There was some mention of a previous echo but I actually can't find these results. She has had some dyspnea, edema and difficult to control hypertension. She has morbid obesity and has been diagnosed with sleep apnea in the past. She has not used her CPAP. She had a recent sleep study but the results are not yet available. She is very sedentary. She gets around a little in her house. She will get dyspneic if she hurries. However, she does not describe PND or orthopnea. She has some chronic lower extremity edema. She doesn't describe any chest pressure, neck or arm discomfort. There is no history of palpitations, presyncope or syncope. She doesn't necessarily watch her salt.  Allergies  Allergen Reactions  . Enalapril Maleate     REACTION: unknown reaction    Current Outpatient Prescriptions  Medication Sig Dispense Refill  . albuterol (PROVENTIL HFA;VENTOLIN HFA) 108 (90 BASE) MCG/ACT inhaler Inhale 2 puffs into the lungs 2 (two) times daily as needed for wheezing.  1 Inhaler  0  . aspirin 81 MG EC tablet Take 1 tablet (81 mg total) by mouth daily.  30 tablet  11  . benztropine (COGENTIN) 0.5 MG tablet TAKE 1/2 TABLET TWICE DAILY  30 tablet  3  . buPROPion (WELLBUTRIN SR) 150 MG 12 hr tablet Take 2 tablets (300 mg total) by mouth daily. Per [facility].  30 tablet  2  . carvedilol (COREG) 12.5 MG tablet Take 1 tablet (12.5 mg total) by mouth 2 (two) times daily with a meal.  60 tablet  3  . clonazePAM (KLONOPIN) 1 MG tablet Take 1 tablet (1 mg total) by mouth at bedtime as needed for anxiety. per Dr. Nicholaus Bloom at [facility]  30 tablet  3  . docusate sodium (COLACE) 100 MG capsule Take 1 capsule (100 mg total) by mouth 2 (two) times daily. Take 2 tabs in am & in pm  60 capsule  11  . Elastic Bandages & Supports (MEDICAL COMPRESSION SOCKS) MISC 1 Units by Does  not apply route daily.  2 each  1  . Elastic Bandages & Supports (MEDICAL COMPRESSION STOCKINGS) MISC 1 Package by Does not apply route daily.  2 each  3  . etodolac (LODINE) 400 MG tablet Take 400 mg by mouth 2 (two) times daily as needed.      . ferrous sulfate 300 (60 FE) MG/5ML syrup Take 5 mLs (300 mg total) by mouth daily.  150 mL  3  . FLUoxetine (PROZAC) 40 MG capsule Take 1 capsule (40 mg total) by mouth daily. Per [facility]  30 capsule  2  . furosemide (LASIX) 20 MG tablet Take 40 mg by mouth 2 (two) times daily.       Marland Kitchen losartan (COZAAR) 25 MG tablet Take 1 tablet (25 mg total) by mouth 2 (two) times daily.  180 tablet  3  . mirtazapine (REMERON) 15 MG tablet TAKE 2 TABLETS AT BEDTIME  60 tablet  3  . omeprazole (PRILOSEC) 20 MG capsule Take 2 capsules (40 mg total) by mouth daily.  90 capsule  11  . risperiDONE (RISPERDAL) 2 MG tablet Take 1 tablet (2 mg total) by mouth 2 (two) times daily. Per [facility]  60 tablet  2  . traMADol (ULTRAM) 50 MG tablet Take 1 tablet (50 mg total) by mouth every 8 (eight) hours  as needed for pain.  30 tablet  0  . traZODone (DESYREL) 100 MG tablet        No current facility-administered medications for this visit.    Past Medical History  Diagnosis Date  . ALLERGIC RHINITIS 08/02/2009  . PRESSURE ULCER OTHER SITE 10/01/2009  . Borderline personality disorder 05/31/2011  . Unspecified psychosis 12/20/2009  . Boil of vulva 11/07/2012  . HTN (hypertension)     Past Surgical History  Procedure Laterality Date  . None      Family History  Problem Relation Age of Onset  . Cancer Father     colon  . Diabetes Mother     History   Social History  . Marital Status: Single    Spouse Name: N/A    Number of Children: N/A  . Years of Education: N/A   Occupational History  . Not on file.   Social History Main Topics  . Smoking status: Never Smoker   . Smokeless tobacco: Not on file  . Alcohol Use: No  . Drug Use: No  . Sexual  Activity: No   Other Topics Concern  . Not on file   Social History Narrative   Goes to adult daycare.  Lives with sister.            ROS:  As stated in the HPI and negative for all other systems.   PHYSICAL EXAM BP 151/90  Pulse 90  Ht 5' 4.5" (1.638 m)  Wt 372 lb (168.738 kg)  BMI 62.89 kg/m2 GENERAL:  Well appearing HEENT:  Pupils equal round and reactive, fundi not visualized, oral mucosa unremarkable, disconjugate gaze NECK:  No jugular venous distention, waveform within normal limits, carotid upstroke brisk and symmetric, no bruits, no thyromegaly LYMPHATICS:  No cervical, inguinal adenopathy LUNGS:  Clear to auscultation bilaterally BACK:  No CVA tenderness CHEST:  Unremarkable HEART:  PMI not displaced or sustained,S1 and S2 within normal limits, no S3, no S4, no clicks, no rubs, no murmurs ABD:  Flat, positive bowel sounds normal in frequency in pitch, no bruits, no rebound, no guarding, no midline pulsatile mass, no hepatomegaly, no splenomegaly EXT:  2 plus pulses throughout, mild edema, no cyanosis no clubbing SKIN:  No rashes no nodules NEURO:  Cranial nerves II through XII grossly intact, motor grossly intact throughout [facility]:  Cognitively intact, oriented to person place and time   EKG:  Sinus rhythm, interventricular conduction delay,  Left axis deviation, no acute ST-T wave changes, rate 90. 02/02/2013  ASSESSMENT AND PLAN  HTN:  Her blood pressure is elevated but she's really not on maximal blood pressure therapies. I think the most prudent control will be related to treating her sleep apnea and weight loss and we discussed this. I will increase her Cozaar to twice a day. Further adjustments can be based on future readings.  DYPSNEA:  I suspect this is multifactorial and related to weight and inactivity. I will check a BNP level and an echocardiogram. However, I do not get a strong suspicion of overt left-sided heart failure.  I will check a TSH  OBESITY:   I think this is her most significant problem and we did discuss specific strategies to include calorie counting.  SLEEP APNEA:  She understands the importance of CPAP or BiPAP when this is prescribed again.

## 2013-01-30 ENCOUNTER — Telehealth: Payer: Self-pay | Admitting: Family Medicine

## 2013-01-30 DIAGNOSIS — G4733 Obstructive sleep apnea (adult) (pediatric): Secondary | ICD-10-CM

## 2013-01-30 NOTE — Telephone Encounter (Signed)
Called patient's HCPOA and sister Humna Moorehouse. Patient has severe sleep apnea on recent sleep study.  Order placed for CPAP titration.   I provided [name] with the phone number to the sleep study center as well in case she does not hear from the sleep center within the next week.

## 2013-01-30 NOTE — Assessment & Plan Note (Signed)
Patient to return to the sleep center for dedicated CPAP titration.

## 2013-02-05 ENCOUNTER — Telehealth: Payer: Self-pay | Admitting: *Deleted

## 2013-02-05 NOTE — Telephone Encounter (Signed)
Pts sister requesting Rx for Shingles shot.  She will pick up when she picks up her Rx that is up front. Fleeger, Maryjo Rochester

## 2013-02-10 ENCOUNTER — Other Ambulatory Visit: Payer: Self-pay | Admitting: Family Medicine

## 2013-02-10 MED ORDER — ZOSTER VACCINE LIVE 19400 UNT/0.65ML ~~LOC~~ SOLR: 0.6500 mL | Freq: Once | SUBCUTANEOUS | Status: DC

## 2013-02-10 NOTE — Telephone Encounter (Signed)
rx for Lauren Wood printed and placed in envelope for pick up.  Please inform patient.

## 2013-02-10 NOTE — Telephone Encounter (Signed)
LMOVM of arlene (pts sister) that rx was ready for pick up. Fleeger, Maryjo Rochester

## 2013-02-10 NOTE — Addendum Note (Signed)
Addended by: Dessa Phi on: 02/10/2013 08:07 AM   Modules accepted: Orders

## 2013-02-11 ENCOUNTER — Ambulatory Visit (HOSPITAL_COMMUNITY): Payer: Medicare Other | Attending: Cardiology

## 2013-02-11 ENCOUNTER — Encounter: Payer: Self-pay | Admitting: Family Medicine

## 2013-02-11 ENCOUNTER — Ambulatory Visit (INDEPENDENT_AMBULATORY_CARE_PROVIDER_SITE_OTHER): Payer: Medicare Other | Admitting: Family Medicine

## 2013-02-11 VITALS — BP 140/90 | HR 72 | Temp 98.9°F | Ht 64.0 in | Wt 362.0 lb

## 2013-02-11 DIAGNOSIS — R0602 Shortness of breath: Secondary | ICD-10-CM

## 2013-02-11 DIAGNOSIS — I10 Essential (primary) hypertension: Secondary | ICD-10-CM

## 2013-02-11 DIAGNOSIS — Z23 Encounter for immunization: Secondary | ICD-10-CM

## 2013-02-11 DIAGNOSIS — H903 Sensorineural hearing loss, bilateral: Secondary | ICD-10-CM | POA: Insufficient documentation

## 2013-02-11 DIAGNOSIS — H9190 Unspecified hearing loss, unspecified ear: Secondary | ICD-10-CM

## 2013-02-11 DIAGNOSIS — I079 Rheumatic tricuspid valve disease, unspecified: Secondary | ICD-10-CM | POA: Insufficient documentation

## 2013-02-11 DIAGNOSIS — R5381 Other malaise: Secondary | ICD-10-CM | POA: Insufficient documentation

## 2013-02-11 DIAGNOSIS — R0609 Other forms of dyspnea: Secondary | ICD-10-CM | POA: Insufficient documentation

## 2013-02-11 DIAGNOSIS — Z0000 Encounter for general adult medical examination without abnormal findings: Secondary | ICD-10-CM | POA: Insufficient documentation

## 2013-02-11 DIAGNOSIS — R0989 Other specified symptoms and signs involving the circulatory and respiratory systems: Secondary | ICD-10-CM | POA: Insufficient documentation

## 2013-02-11 DIAGNOSIS — I509 Heart failure, unspecified: Secondary | ICD-10-CM

## 2013-02-11 DIAGNOSIS — I5030 Unspecified diastolic (congestive) heart failure: Secondary | ICD-10-CM

## 2013-02-11 DIAGNOSIS — R609 Edema, unspecified: Secondary | ICD-10-CM | POA: Insufficient documentation

## 2013-02-11 DIAGNOSIS — G4733 Obstructive sleep apnea (adult) (pediatric): Secondary | ICD-10-CM

## 2013-02-11 DIAGNOSIS — Z111 Encounter for screening for respiratory tuberculosis: Secondary | ICD-10-CM

## 2013-02-11 DIAGNOSIS — I059 Rheumatic mitral valve disease, unspecified: Secondary | ICD-10-CM | POA: Insufficient documentation

## 2013-02-11 DIAGNOSIS — G4730 Sleep apnea, unspecified: Secondary | ICD-10-CM | POA: Insufficient documentation

## 2013-02-11 DIAGNOSIS — H9192 Unspecified hearing loss, left ear: Secondary | ICD-10-CM

## 2013-02-11 NOTE — Assessment & Plan Note (Signed)
A: Improved with a 10 pound weight loss. P: Encouraged weight loss efforts. Due due to mental illness and mood disorder I do not believe the patient would meet requirements for weight loss surgery.

## 2013-02-11 NOTE — Assessment & Plan Note (Signed)
Patient has scheduled followup sleep study  by for BiPAP titration. Will order BiPAP machine with sleep study completed.

## 2013-02-11 NOTE — Assessment & Plan Note (Signed)
Referral to GI for screening colonoscopy 

## 2013-02-11 NOTE — Progress Notes (Signed)
Echocardiogram performed.  

## 2013-02-11 NOTE — Progress Notes (Signed)
  Subjective:    Patient ID: Lauren Wood, female    DOB: Sep 24, 1960, 52 y.o.   MRN: [id number]  HPI 52 year old female with history of mild mental retardation, borderline personality disorder, unspecified psychosis presents with her sister for followup physical (she has a physical form from [employer] for me to fill out):  #1 CHF: Patient had a repeat echo done this month. Report was available today. Her ejection fraction is normal at 55-60%. She does have grade 1 diastolic dysfunction. She denies chest pain or shortness of breath. She's compliant with Lasix, losartan 25 daily (has not yet started the BID dosingy prescribed by Dr.Hochrein due to recently getting the Rx filled) and Coreg 2.5 mg twice a day.  #2 hypertension: As per above. Complains medication. Denies headache, vision changes, chest pain, shortness of breath.  #3 obstructive sleep apnea: Patient has followup sleep study for BiPAP titration scheduled for 03/04/2013.  #4 morbid obesity: Patient has lost 10 pounds since her last office visit.  #5 health care maintenance: Patient if her flu shot and TB screen. She's also due for colonoscopy all has never had one.   Review of Systems  as per history of present illness    Objective: BP 140/90  Pulse 72  Temp(Src) 98.9 F (37.2 C) (Oral)  Ht 5\' 4"  (1.626 m)  Wt 362 lb (164.202 kg)  BMI 62.11 kg/m2  LMP 01/26/2013   Physical Exam  Constitutional: She is oriented to person, place, and time. She appears well-developed and well-nourished. No distress.  Hearing aid in left ear.  HENT:  Head: Normocephalic and atraumatic.  Right Ear: External ear and ear canal normal. Tympanic membrane is injected.  Left Ear: External ear normal.  Mouth/Throat: Oropharynx is clear and moist and mucous membranes are normal. Abnormal dentition.  Right ear tympanostomy tubes visualized.  Left ear cerumen impacted attempted to remove with irrigation and manual department was  unsuccessful. Unable to visualize TM.  Eyes: Conjunctivae are normal. Pupils are equal, round, and reactive to light.  Neck: No mass and no thyromegaly present.  Cardiovascular: Normal rate and regular rhythm.   Pulmonary/Chest: Effort normal. She has decreased breath sounds.  Diffusely decreased breath sounds.  Abdominal:  Obese Normoactive bowel sounds. Nontender without masses.  Musculoskeletal:  Trace lower extremity edema.  Neurological: She is alert and oriented to person, place, and time.          Assessment & Plan:

## 2013-02-11 NOTE — Assessment & Plan Note (Signed)
A: Well-controlled today. Patient is yet to increase we'll start him to 25 mg twice a day. P: Agree with increase of losartan due to intermittently elevated blood pressures above goal.

## 2013-02-11 NOTE — Patient Instructions (Addendum)
Thank you for coming in today. Physical done. [facility] will call to set up your screening colonoscopy.   Please start losartan 25 mg daily.   Dr. Armen Pickup

## 2013-02-11 NOTE — Assessment & Plan Note (Signed)
A: Patient wears a hearing aid and is followed by [facility]. P: Patient to followup with [facility] for left ear cerumen impaction removal and to assess the hearing aid for function.

## 2013-02-13 ENCOUNTER — Ambulatory Visit: Payer: Medicare Other | Admitting: *Deleted

## 2013-02-13 DIAGNOSIS — Z111 Encounter for screening for respiratory tuberculosis: Secondary | ICD-10-CM

## 2013-02-13 LAB — TB SKIN TEST
Induration: 12 mm
TB Skin Test: NEGATIVE

## 2013-02-13 NOTE — Progress Notes (Signed)
PPD Reading Note PPD read and results entered in EpicCare. Result: 12 x 7 mm induration. Preceptor (Dr. Leveda Anna) also looked at arm and confirmed negative results. Interpretation: Negative If test not read within 48-72 hours of initial placement, patient advised to repeat in other arm 1-3 weeks after this test. Allergic reaction: no

## 2013-02-25 ENCOUNTER — Encounter: Payer: Self-pay | Admitting: Internal Medicine

## 2013-03-03 ENCOUNTER — Telehealth: Payer: Self-pay | Admitting: *Deleted

## 2013-03-03 DIAGNOSIS — G4733 Obstructive sleep apnea (adult) (pediatric): Secondary | ICD-10-CM

## 2013-03-03 NOTE — Telephone Encounter (Signed)
Spoke with Marita Kansas at [facility], states they received another referral for bipap titration for patient states that the recommendation by Dr. Maple Hudson at split night done on 8/27 was to recommend trying home bilevel inspiratory 15 and expiratory 10 and that [facility] will go out routinely to see if this needs further titrating. Marita Kansas states he will fax over the DME for for MD to sign and he will forward this to [facility]. Form placed in MD box.

## 2013-03-04 ENCOUNTER — Encounter (HOSPITAL_BASED_OUTPATIENT_CLINIC_OR_DEPARTMENT_OTHER): Payer: Medicare Other

## 2013-03-04 NOTE — Telephone Encounter (Signed)
DME form filled out for BiPAP.  Dr. Denny Levy cosigned the form as I do not have a current PECOS #  Called patient's sister, Berdie Malter, with update.

## 2013-03-04 NOTE — Telephone Encounter (Signed)
Called Lauren Wood back to discuss the home titration process. My concern is that the process is complicated and Lauren Wood and her sister would have difficulty with the titration. Vernon informed me that Lauren Wood did well with the biPAP 15/10 and the DME supplier ([facility]) could make periodic downloads to assess compliance and make adjustments as often as I needed.   Filled out DME form.

## 2013-03-12 ENCOUNTER — Encounter: Payer: Self-pay | Admitting: Internal Medicine

## 2013-03-13 ENCOUNTER — Other Ambulatory Visit: Payer: Self-pay | Admitting: Family Medicine

## 2013-03-31 ENCOUNTER — Encounter: Payer: Self-pay | Admitting: Internal Medicine

## 2013-04-01 ENCOUNTER — Telehealth: Payer: Self-pay | Admitting: Family Medicine

## 2013-04-01 DIAGNOSIS — Z0000 Encounter for general adult medical examination without abnormal findings: Secondary | ICD-10-CM

## 2013-04-01 NOTE — Telephone Encounter (Signed)
Mother called and needs Dr. Armen Pickup to send in other referral for a colonoscopy. They were not able to do the one scheduled and had to reschedule and they will need to have another one sent . jw

## 2013-04-02 NOTE — Telephone Encounter (Signed)
New referral to GI placed for colonoscopy Please inform patient's sister, Ms. [name] Spranger.

## 2013-04-03 ENCOUNTER — Other Ambulatory Visit: Payer: Self-pay | Admitting: Family Medicine

## 2013-04-04 NOTE — Telephone Encounter (Signed)
Sister is aware of this. Jazmin Hartsell,CMA

## 2013-04-14 ENCOUNTER — Encounter: Payer: Medicare Other | Admitting: Internal Medicine

## 2013-04-23 ENCOUNTER — Ambulatory Visit: Payer: Medicare Other | Admitting: Family Medicine

## 2013-05-02 ENCOUNTER — Ambulatory Visit
Admission: RE | Admit: 2013-05-02 | Discharge: 2013-05-02 | Disposition: A | Discharge status: Home / Self Care | Payer: Medicare Other | Source: Ambulatory Visit | Attending: Family Medicine | Admitting: Family Medicine

## 2013-05-02 ENCOUNTER — Ambulatory Visit (INDEPENDENT_AMBULATORY_CARE_PROVIDER_SITE_OTHER): Payer: Medicare Other | Admitting: Family Medicine

## 2013-05-02 ENCOUNTER — Telehealth: Payer: Self-pay | Admitting: Family Medicine

## 2013-05-02 ENCOUNTER — Encounter: Payer: Self-pay | Admitting: Family Medicine

## 2013-05-02 VITALS — BP 157/86 | HR 97 | Temp 98.6°F | Ht 64.0 in | Wt 372.0 lb

## 2013-05-02 DIAGNOSIS — R0609 Other forms of dyspnea: Secondary | ICD-10-CM | POA: Insufficient documentation

## 2013-05-02 DIAGNOSIS — R0989 Other specified symptoms and signs involving the circulatory and respiratory systems: Secondary | ICD-10-CM

## 2013-05-02 DIAGNOSIS — I5030 Unspecified diastolic (congestive) heart failure: Secondary | ICD-10-CM

## 2013-05-02 DIAGNOSIS — R0600 Dyspnea, unspecified: Secondary | ICD-10-CM | POA: Insufficient documentation

## 2013-05-02 DIAGNOSIS — I509 Heart failure, unspecified: Secondary | ICD-10-CM

## 2013-05-02 DIAGNOSIS — Z23 Encounter for immunization: Secondary | ICD-10-CM

## 2013-05-02 LAB — BASIC METABOLIC PANEL
BUN: 15 mg/dL (ref 6–23)
CO2: 26 mEq/L (ref 19–32)
Calcium: 9.2 mg/dL (ref 8.4–10.5)
Chloride: 105 mEq/L (ref 96–112)
Creat: 1.01 mg/dL (ref 0.50–1.10)
Glucose, Bld: 88 mg/dL (ref 70–99)
Potassium: 4.3 mEq/L (ref 3.5–5.3)
Sodium: 143 mEq/L (ref 135–145)

## 2013-05-02 LAB — PRO B NATRIURETIC PEPTIDE: Pro B Natriuretic peptide (BNP): 216.9 pg/mL — ABNORMAL HIGH (ref <126)

## 2013-05-02 LAB — CBC
HCT: 38.2 % (ref 36.0–46.0)
Hemoglobin: 12.4 g/dL (ref 12.0–15.0)
MCH: 25.8 pg — ABNORMAL LOW (ref 26.0–34.0)
MCHC: 32.5 g/dL (ref 30.0–36.0)
MCV: 79.6 fL (ref 78.0–100.0)
Platelets: 322 10*3/uL (ref 150–400)
RBC: 4.8 MIL/uL (ref 3.87–5.11)
RDW: 16.6 % — ABNORMAL HIGH (ref 11.5–15.5)
WBC: 9.8 10*3/uL (ref 4.0–10.5)

## 2013-05-02 MED ORDER — TETANUS-DIPHTH-ACELL PERTUSSIS 5-2.5-18.5 LF-MCG/0.5 IM SUSP: 0.5000 mL | Freq: Once | INTRAMUSCULAR | Status: DC

## 2013-05-02 MED ORDER — CARVEDILOL 25 MG PO TABS: 25.0000 mg | ORAL_TABLET | Freq: Two times a day (BID) | ORAL | Status: DC

## 2013-05-02 MED ORDER — FUROSEMIDE 40 MG PO TABS: 40.0000 mg | ORAL_TABLET | Freq: Two times a day (BID) | ORAL | Status: DC

## 2013-05-02 NOTE — Patient Instructions (Signed)
Renny,  Thank you for coming in today. I will be in touch with labs. Please get your chest x-ray done this afternoon. [facility] will supply your oxygen.   Increase coreg to 25 mg twice daily.  No other medication changes at this time.   Follow up with me next week Monday or Tuesday. Plan to start exercise program next week 10 mins walking with oxygen.    For your diet:  1. Make sure to eat breakfast, lunch and dinner (also may add one snack mid morning or mid afternoon).  2. Carbs: no more than 2 servings (30 gram/2oz) per meal and 1 serving per snack.   1.5 Gram Low Sodium Diet A 1.5 gram sodium diet restricts the amount of sodium in the diet to no more than 1.5 g or 1500 mg daily. The American Heart Association recommends Americans over the age of 58 to consume no more than 1500 mg of sodium each day to reduce the risk of developing high blood pressure. Research also shows that limiting sodium may reduce heart attack and stroke risk. Many foods contain sodium for flavor and sometimes as a preservative. When the amount of sodium in a diet needs to be low, it is important to know what to look for when choosing foods and drinks. The following includes some information and guidelines to help make it easier for you to adapt to a low sodium diet. QUICK TIPS  Do not add salt to food.  Avoid convenience items and fast food.  Choose unsalted snack foods.  Buy lower sodium products, often labeled as "lower sodium" or "no salt added."  Check food labels to learn how much sodium is in 1 serving.  When eating at a restaurant, ask that your food be prepared with less salt or none, if possible. READING FOOD LABELS FOR SODIUM INFORMATION The nutrition facts label is a good place to find how much sodium is in foods. Look for products with no more than 400 mg of sodium per serving. Remember that 1.5 g = 1500 mg. The food label may also list foods as:  Sodium-free: Less than 5 mg in  a serving.  Very low sodium: 35 mg or less in a serving.  Low-sodium: 140 mg or less in a serving.  Light in sodium: 50% less sodium in a serving. For example, if a food that usually has 300 mg of sodium is changed to become light in sodium, it will have 150 mg of sodium.  Reduced sodium: 25% less sodium in a serving. For example, if a food that usually has 400 mg of sodium is changed to reduced sodium, it will have 300 mg of sodium. CHOOSING FOODS Grains  Avoid: Salted crackers and snack items. Some cereals, including instant hot cereals. Bread stuffing and biscuit mixes. Seasoned rice or pasta mixes.  Choose: Unsalted snack items. Low-sodium cereals, oats, puffed wheat and rice, shredded wheat. English muffins and bread. Pasta. Meats  Avoid: Salted, canned, smoked, spiced, pickled meats, including fish and poultry. Bacon, ham, sausage, cold cuts, hot dogs, anchovies.  Choose: Low-sodium canned tuna and salmon. Fresh or frozen meat, poultry, and fish. Dairy  Avoid: Processed cheese and spreads. Cottage cheese. Buttermilk and condensed milk. Regular cheese.  Choose: Milk. Low-sodium cottage cheese. Yogurt. Sour cream. Low-sodium cheese. Fruits and Vegetables  Avoid: Regular canned vegetables. Regular canned tomato sauce and paste. Frozen vegetables in sauces. Olives. Rosita Fire. Relishes. Sauerkraut.  Choose: Low-sodium canned vegetables. Low-sodium tomato sauce and paste. Frozen  or fresh vegetables. Fresh and frozen fruit. Condiments  Avoid: Canned and packaged gravies. Worcestershire sauce. Tartar sauce. Barbecue sauce. Soy sauce. Steak sauce. Ketchup. Onion, garlic, and table salt. Meat flavorings and tenderizers.  Choose: Fresh and dried herbs and spices. Low-sodium varieties of mustard and ketchup. Lemon juice. Tabasco sauce. Horseradish. SAMPLE 1.5 GRAM SODIUM MEAL PLAN Breakfast / Sodium (mg)  1 cup low-fat milk / 143 mg  1 whole-wheat English muffin / 240 mg  1 tbs  heart-healthy margarine / 153 mg  1 hard-boiled egg / 139 mg  1 small orange / 0 mg Lunch / Sodium (mg)  1 cup raw carrots / 76 mg  2 tbs no salt added peanut butter / 5 mg  2 slices whole-wheat bread / 270 mg  1 tbs jelly / 6 mg   cup red grapes / 2 mg Dinner / Sodium (mg)  1 cup whole-wheat pasta / 2 mg  1 cup low-sodium tomato sauce / 73 mg  3 oz lean ground beef / 57 mg  1 small side salad (1 cup raw spinach leaves,  cup cucumber,  cup yellow bell pepper) with 1 tsp olive oil and 1 tsp red wine vinegar / 25 mg Snack / Sodium (mg)  1 container low-fat vanilla yogurt / 107 mg  3 graham cracker squares / 127 mg Nutrient Analysis  Calories: 1745  Protein: 75 g  Carbohydrate: 237 g  Fat: 57 g  Sodium: 1425 mg Document Released: 05/01/2005 Document Revised: 07/24/2011 Document Reviewed: 08/02/2009 ExitCare Patient Information 2014 [place], [place].

## 2013-05-02 NOTE — Telephone Encounter (Signed)
Called patient's sister. Reviewed CXR. Cardiomegaly with mild pulmonary venous congestion. This is superimposed upon and accentuated by low lung volumes. Patient not compliant with lasix, no CPAP yet.  Plan: lasix 40 mg BID ( 6 hrs apart) Close f/u with me. She needs CPAP ASAP.

## 2013-05-02 NOTE — Progress Notes (Signed)
   Subjective:    Patient ID: Lauren Wood, female    DOB: Aug 16, 1960, 52 y.o.   MRN: [id number]  HPI 52 yo F  With mild MR, borderline personality disorder, systlic  presents for f/u visit:  1. Cervical cancer screening: not due for pap until 12/2013. Last one was negative.  2. Dyspnea: dyspnea on exertion for the past 3 weeks. Patient SOB when walking around her day center. She does not exercise. She is compliant with all medication. She denies CP, syncope, presyncope, orthopnea, GI upset, and SOB at rest.   Review of Systems As per HPI     Objective:   Physical Exam BP 185/94  Pulse 97  Temp(Src) 98.6 F (37 C) (Oral)  Ht 5\' 4"  (1.626 m)  Wt 272 lb 3.2 oz (123.469 kg)  BMI 46.70 kg/m2 Wt Readings from Last 3 Encounters:  05/02/13 372 lb (168.738 kg)  02/11/13 362 lb (164.202 kg)  01/28/13 372 lb (168.738 kg)  General appearance: alert, cooperative, no distress and morbidly obese, comfortable at rest.  Lungs: clear to auscultation bilaterally Heart: regular rate and rhythm, S1, S2 normal, no murmur, click, rub or gallop Extremities: obese, no pitting edema.       Assessment & Plan:

## 2013-05-02 NOTE — Assessment & Plan Note (Signed)
[name] with hypoxemia following ambulation. Hemodynamically stable w/o CP or SOB at rest.  P: Supplemental O2 ordered for ambulation  CXR, pro BNP, BMP, CBC Increase BB to dose to 25 mg BID in the setting of diastolic CHF and HTN initially 185/94.  Close f/u in 3-4 days.  Addendum phone convo: Called patient. Reviewed CXR. Cardiomegaly with mild pulmonary venous congestion. This is superimposed upon and accentuated by low lung volumes. Patient not compliant with lasix, no CPAP yet.  Plan: lasix 40 mg BID ( 6 hrs apart) Close f/u with me. She needs CPAP ASAP.

## 2013-05-02 NOTE — Assessment & Plan Note (Signed)
[name] with hypoxemia following ambulation. Hemodynamically stable w/o CP or SOB at rest.  P: Supplemental O2 ordered for ambulation  CXR, pro BNP, BMP, CBC Increase BB to dose to 25 mg BID in the setting of diastolic CHF and HTN initially 185/94.  Close f/u in 3-4 days.

## 2013-05-05 ENCOUNTER — Telehealth: Payer: Self-pay | Admitting: Family Medicine

## 2013-05-05 NOTE — Telephone Encounter (Signed)
Pt called and was wondering when her orders for oxygen are going to be sent in to [facility]. jw

## 2013-05-05 NOTE — Telephone Encounter (Signed)
Orders sent at last appt on 05/02/13. Will route to assisting CNA Sarah for assistance.  Sarah, please call patient with update.  Please let me know if you need anything from me.   Thanks,  Dr. Armen Pickup

## 2013-05-06 ENCOUNTER — Ambulatory Visit: Payer: Medicare Other | Admitting: Family Medicine

## 2013-05-06 NOTE — Addendum Note (Signed)
Addended by: Jimmy Footman K on: 05/06/2013 02:50 PM   Modules accepted: Orders

## 2013-05-06 NOTE — Progress Notes (Signed)
SATURATION QUALIFICATIONS: (This note is used to comply with regulatory documentation for home oxygen)  Patient Saturations on Room Air at Rest = 92%  Patient Saturations on Room Air while Ambulating = 83%  Patient Saturations on 2Liters of oxygen while Ambulating = 93%  Please briefly explain why patient needs home oxygen: CHF

## 2013-05-10 ENCOUNTER — Other Ambulatory Visit: Payer: Self-pay | Admitting: Family Medicine

## 2013-05-12 ENCOUNTER — Telehealth: Payer: Self-pay | Admitting: Family Medicine

## 2013-05-12 NOTE — Telephone Encounter (Signed)
Pts sister informed and appt made for 05/26/13. Fleeger, Maryjo Rochester

## 2013-05-12 NOTE — Telephone Encounter (Signed)
Please call patient: Letter written and placed up front for pick up. Patient missed f/u appt last week. Please schedule another appt.

## 2013-05-12 NOTE — Telephone Encounter (Signed)
Patient is needing a letter written for her work to be able to refill her oxygen.  She would like to be able to pick it up this afternoon.

## 2013-05-13 ENCOUNTER — Other Ambulatory Visit: Payer: Self-pay | Admitting: Family Medicine

## 2013-05-13 DIAGNOSIS — I509 Heart failure, unspecified: Secondary | ICD-10-CM

## 2013-05-13 DIAGNOSIS — R0600 Dyspnea, unspecified: Secondary | ICD-10-CM

## 2013-05-13 DIAGNOSIS — Z23 Encounter for immunization: Secondary | ICD-10-CM

## 2013-05-13 DIAGNOSIS — I5030 Unspecified diastolic (congestive) heart failure: Secondary | ICD-10-CM

## 2013-05-13 MED ORDER — CARVEDILOL 25 MG PO TABS: 25.0000 mg | ORAL_TABLET | Freq: Two times a day (BID) | ORAL | Status: DC

## 2013-05-13 NOTE — Telephone Encounter (Signed)
Phone in rx refill request for coreg.

## 2013-05-20 ENCOUNTER — Encounter: Payer: Self-pay | Admitting: Internal Medicine

## 2013-05-20 ENCOUNTER — Ambulatory Visit (INDEPENDENT_AMBULATORY_CARE_PROVIDER_SITE_OTHER): Payer: Medicare Other | Admitting: Internal Medicine

## 2013-05-20 VITALS — BP 126/90 | HR 64 | Ht 64.0 in | Wt 364.2 lb

## 2013-05-20 DIAGNOSIS — Z01818 Encounter for other preprocedural examination: Secondary | ICD-10-CM

## 2013-05-20 DIAGNOSIS — G4733 Obstructive sleep apnea (adult) (pediatric): Secondary | ICD-10-CM

## 2013-05-20 DIAGNOSIS — Z1211 Encounter for screening for malignant neoplasm of colon: Secondary | ICD-10-CM

## 2013-05-20 MED ORDER — NA SULFATE-K SULFATE-MG SULF 17.5-3.13-1.6 GM/177ML PO SOLN: ORAL | Status: DC

## 2013-05-20 NOTE — Patient Instructions (Addendum)
You have been scheduled for a colonoscopy with propofol at [facility]. Please follow written instructions given to you at your visit today.  Please use the prep kit you have been given today. If you use inhalers (even only as needed), please bring them with you on the day of your procedure.  I appreciate the opportunity to care for you.

## 2013-05-20 NOTE — Progress Notes (Signed)
Referred by: Minerva Ends, MD  Subjective:    Patient ID: Lauren Wood, female    DOB: [birth year], 53 y.o.   MRN: [id number]  HPI Is a very pleasant 53 year old [place] American woman with no active GI symptoms but desiring screening colonoscopy, or more correctly her sister and power of attorney does. The patient has mental illness and mild mental retardation. Other chronic medical problems include morbid obesity, congestive heart failure and obstructive sleep apnea all stable. Allergies  Allergen Reactions  . Enalapril Maleate     REACTION: unknown reaction   Outpatient Prescriptions Prior to Visit  Medication Sig Dispense Refill  . albuterol (PROVENTIL HFA;VENTOLIN HFA) 108 (90 BASE) MCG/ACT inhaler Inhale 2 puffs into the lungs 2 (two) times daily as needed for wheezing.  1 Inhaler  0  . aspirin 81 MG EC tablet Take 1 tablet (81 mg total) by mouth daily.  30 tablet  11  . benztropine (COGENTIN) 0.5 MG tablet TAKE 1/2 TABLET TWICE DAILY  30 tablet  3  . buPROPion (WELLBUTRIN SR) 150 MG 12 hr tablet Take 2 tablets (300 mg total) by mouth daily. Per [facility].  30 tablet  2  . carvedilol (COREG) 25 MG tablet Take 1 tablet (25 mg total) by mouth 2 (two) times daily with a meal.  60 tablet  3  . DOK 100 MG capsule TAKE 2 CAPSULES IN THE MORNING AND IN THE EVENING  60 capsule  1  . Elastic Bandages & Supports (MEDICAL COMPRESSION SOCKS) MISC 1 Units by Does not apply route daily.  2 each  1  . Elastic Bandages & Supports (MEDICAL COMPRESSION STOCKINGS) MISC 1 Package by Does not apply route daily.  2 each  3  . etodolac (LODINE) 400 MG tablet Take 400 mg by mouth 2 (two) times daily as needed.      . ferrous sulfate 220 (44 FE) MG/5ML solution TAKE 6.19ml BY MOUTH DAILY  240 mL  3  . FLUoxetine (PROZAC) 40 MG capsule Take 1 capsule (40 mg total) by mouth daily. Per [facility]  30 capsule  2  . furosemide (LASIX) 40 MG tablet Take 1 tablet (40 mg total) by mouth 2  (two) times daily.  30 tablet  0  . losartan (COZAAR) 25 MG tablet Take 1 tablet (25 mg total) by mouth 2 (two) times daily.  180 tablet  3  . omeprazole (PRILOSEC) 20 MG capsule Take 2 capsules (40 mg total) by mouth daily.  90 capsule  11  . traMADol (ULTRAM) 50 MG tablet Take 1 tablet (50 mg total) by mouth every 8 (eight) hours as needed for pain.  30 tablet  0  . traZODone (DESYREL) 100 MG tablet       . clonazePAM (KLONOPIN) 1 MG tablet Take 1 tablet (1 mg total) by mouth at bedtime as needed for anxiety. per Dr. Georgina Peer at [facility]  30 tablet  3  . mirtazapine (REMERON) 15 MG tablet TAKE 2 TABLETS AT BEDTIME  60 tablet  3  . risperiDONE (RISPERDAL) 2 MG tablet Take 1 tablet (2 mg total) by mouth 2 (two) times daily. Per [facility]  60 tablet  2   No facility-administered medications prior to visit.   Past Medical History  Diagnosis Date  . ALLERGIC RHINITIS 08/02/2009  . PRESSURE ULCER OTHER SITE 10/01/2009  . Borderline personality disorder 05/31/2011  . Unspecified psychosis 12/20/2009  . Boil of vulva  11/07/2012  . HTN (hypertension)   . Hip pain 12/03/2012  . Arthritis   . Sleep apnea   . Obesity    Past Surgical History  Procedure Laterality Date  . Tympanostomy tube placement Bilateral   . Cyst removed      forehead   History   Social History  . Marital Status: Single    Spouse Name: N/A    Number of Children: 0  . Years of Education: N/A   Social History Main Topics  . Smoking status: Never Smoker   . Smokeless tobacco: Never Used  . Alcohol Use: No  . Drug Use: No  . Sexual Activity: No   Other Topics Concern  . None   Social History Narrative   Goes to adult daycare.  Lives with sister.           Family History  Problem Relation Age of Onset  . Colon cancer Father   . Diabetes Mother         Review of Systems Walks with a cane, pedal edema problems decreased hearing, arthritis.    Objective:   Physical Exam General:  NAD,  morbidly obese wearing oxygen nasal cannula Eyes:   anicteric Lungs:  clear posteriorly Heart:  S1S2 no rubs, murmurs or gallops Abdomen:  Obese     Data Reviewed:  Lab Results  Component Value Date   WBC 9.8 05/02/2013   HGB 12.4 05/02/2013   HCT 38.2 05/02/2013   MCV 79.6 05/02/2013   PLT 322 05/02/2013      Assessment & Plan:   1. Preoperative examination, unspecified   2. Special screening for malignant neoplasms, colon   3. Obstructive sleep apnea   4. Morbid obesity    We'll schedule a screening colonoscopy at the hospital. Endoscopy risks discussed with the patient and her sister. Risks benefits and include aren't limited to bleeding infection medication reaction perforation need for surgery were discussed. She will be cared for at the hospital given her comorbidities.  I appreciate the opportunity to care for this patient. CC: Minerva Ends, MD

## 2013-05-23 ENCOUNTER — Encounter: Payer: Self-pay | Admitting: Family Medicine

## 2013-05-26 ENCOUNTER — Ambulatory Visit (INDEPENDENT_AMBULATORY_CARE_PROVIDER_SITE_OTHER): Payer: Medicare Other | Admitting: Family Medicine

## 2013-05-26 ENCOUNTER — Encounter: Payer: Self-pay | Admitting: Family Medicine

## 2013-05-26 ENCOUNTER — Ambulatory Visit (HOSPITAL_COMMUNITY)
Admission: RE | Admit: 2013-05-26 | Discharge: 2013-05-26 | Disposition: A | Discharge status: Home / Self Care | Payer: Medicare Other | Source: Ambulatory Visit | Attending: Family Medicine | Admitting: Family Medicine

## 2013-05-26 VITALS — BP 165/78 | HR 81 | Ht 64.0 in | Wt 371.0 lb

## 2013-05-26 DIAGNOSIS — I509 Heart failure, unspecified: Secondary | ICD-10-CM | POA: Insufficient documentation

## 2013-05-26 DIAGNOSIS — I10 Essential (primary) hypertension: Secondary | ICD-10-CM

## 2013-05-26 DIAGNOSIS — G4733 Obstructive sleep apnea (adult) (pediatric): Secondary | ICD-10-CM

## 2013-05-26 DIAGNOSIS — R0989 Other specified symptoms and signs involving the circulatory and respiratory systems: Secondary | ICD-10-CM

## 2013-05-26 DIAGNOSIS — R0609 Other forms of dyspnea: Secondary | ICD-10-CM

## 2013-05-26 DIAGNOSIS — R9431 Abnormal electrocardiogram [ECG] [EKG]: Secondary | ICD-10-CM | POA: Insufficient documentation

## 2013-05-26 DIAGNOSIS — I5030 Unspecified diastolic (congestive) heart failure: Secondary | ICD-10-CM

## 2013-05-26 DIAGNOSIS — R0602 Shortness of breath: Secondary | ICD-10-CM | POA: Insufficient documentation

## 2013-05-26 DIAGNOSIS — R0600 Dyspnea, unspecified: Secondary | ICD-10-CM

## 2013-05-26 LAB — BASIC METABOLIC PANEL
BUN: 12 mg/dL (ref 6–23)
CO2: 30 mEq/L (ref 19–32)
Calcium: 9 mg/dL (ref 8.4–10.5)
Chloride: 105 mEq/L (ref 96–112)
Creat: 0.95 mg/dL (ref 0.50–1.10)
Glucose, Bld: 91 mg/dL (ref 70–99)
Potassium: 3.8 mEq/L (ref 3.5–5.3)
Sodium: 141 mEq/L (ref 135–145)

## 2013-05-26 MED ORDER — FUROSEMIDE 40 MG PO TABS: 40.0000 mg | ORAL_TABLET | Freq: Two times a day (BID) | ORAL | Status: DC

## 2013-05-26 NOTE — Assessment & Plan Note (Addendum)
A: improved with supplemental O2. EKG unchanged from 01/2013. Evaluation consistent with mild CHF exacerbation. Most likely triggers are obesity hypoventilation syndrome and untreated sleep apnea. Patient is now using CPAP. I do not anticipate significant improvement in obesity given mild MR and mood disorder as serious barriers to lifestyle changes.  P: Restart lasix  Order O2 concentrator to provide 8 hrs of O2 w/o requiring heavy tanks. Of note patient walks with a cane.  F/u in 4 weeks. Faxed hand written rx for O2 concentrator to [facility] at fax # [phone]

## 2013-05-26 NOTE — Assessment & Plan Note (Addendum)
A: declined today. Off lasix for 9 days. Wt also up.  P: Restart lasix 40 mg BID  Check BMP  F/u in 3-4 weeks

## 2013-05-26 NOTE — Progress Notes (Signed)
   Subjective:    Patient ID: Lauren Wood, female    DOB: [birth year], 53 y.o.   MRN: [id number]  HPI 53 yo F with psychiatric illness and mild MR presents with her sister for f/u visit:  1. DOE: now on 2 L O2 via [place]. Has two tanks with her currently which provide the necessary 8 hrs of ambulatory O2. Reports improvement in shortness of breath. She has also started CPAP since last visit. Reports that improves SOB as well. Takes it off at night reportedly due to cold air blowing into her eyes (has nose mask). Continues to deny chest pain. Took lasix 40 mg BID x 2 weeks. Last dose 05/17/13 (ran out due to missed f/u).   Health maintenance: will have screening colonoscopy in the hospital next month.   Review of Systems As per HPI     Objective:   Physical Exam BP 165/78  Pulse 81  Ht 5\' 4"  (1.626 m)  Wt 371 lb (168.284 kg)  BMI 63.65 kg/m2  SpO2 95%  LMP 01/26/2013 BP Readings from Last 3 Encounters:  05/26/13 165/78  05/20/13 126/90  05/02/13 157/86   Wt Readings from Last 3 Encounters:  05/26/13 371 lb (168.284 kg)  05/20/13 364 lb 4 oz (165.223 kg)  05/02/13 372 lb (168.738 kg)   General appearance: alert, cooperative, no distress and morbidly obese Nose: nasal canula in place  Lungs: clear to auscultation bilaterally Heart: regular rate and rhythm, S1, S2 normal, no murmur, click, rub or gallop Extremities: edema trace LE edema b/l   EKG: unchanged from previous tracings.      Assessment & Plan:

## 2013-05-26 NOTE — Patient Instructions (Signed)
Jasani,  Thank you for coming in to see me today. I have refilled lasix, tale 40 mg twice daily. Be sure to also take losartan 25 mg twice daily.   Your EKG is unchanged from the last one done in September 2014.  I will be in touch with blood work results and let you know if you need to take a potassium supplement.   Please call [facility] to have your CPAP mask and heater adjusted.  I will call them about the concentrator.   Please see me in f/u in 3-4 weeks.   Dr. Adrian Blackwater

## 2013-05-27 ENCOUNTER — Telehealth: Payer: Self-pay | Admitting: Family Medicine

## 2013-05-27 NOTE — Telephone Encounter (Signed)
Called patient's sister and HCPOA Normal blood work. No supplemental K at this time.

## 2013-06-03 ENCOUNTER — Encounter (HOSPITAL_COMMUNITY): Payer: Self-pay | Admitting: Pharmacy Technician

## 2013-06-11 ENCOUNTER — Encounter (HOSPITAL_COMMUNITY): Payer: Self-pay | Admitting: *Deleted

## 2013-06-18 ENCOUNTER — Ambulatory Visit: Payer: Medicare Other | Admitting: Family Medicine

## 2013-06-23 ENCOUNTER — Other Ambulatory Visit: Payer: Self-pay | Admitting: Family Medicine

## 2013-06-24 ENCOUNTER — Ambulatory Visit (HOSPITAL_COMMUNITY)
Admission: RE | Admit: 2013-06-24 | Discharge: 2013-06-24 | Disposition: A | Discharge status: Home / Self Care | Payer: Medicare HMO | Source: Ambulatory Visit | Attending: Internal Medicine | Admitting: Internal Medicine

## 2013-06-24 ENCOUNTER — Encounter (HOSPITAL_COMMUNITY): Payer: Medicare HMO | Admitting: Anesthesiology

## 2013-06-24 ENCOUNTER — Ambulatory Visit (HOSPITAL_COMMUNITY): Payer: Medicare HMO | Admitting: Anesthesiology

## 2013-06-24 ENCOUNTER — Encounter (HOSPITAL_COMMUNITY)
Admission: RE | Disposition: A | Discharge status: Home / Self Care | Payer: Self-pay | Source: Ambulatory Visit | Attending: Internal Medicine

## 2013-06-24 ENCOUNTER — Encounter: Payer: Self-pay | Admitting: Family Medicine

## 2013-06-24 ENCOUNTER — Encounter (HOSPITAL_COMMUNITY): Payer: Self-pay | Admitting: *Deleted

## 2013-06-24 DIAGNOSIS — G4730 Sleep apnea, unspecified: Secondary | ICD-10-CM | POA: Insufficient documentation

## 2013-06-24 DIAGNOSIS — K5730 Diverticulosis of large intestine without perforation or abscess without bleeding: Secondary | ICD-10-CM | POA: Insufficient documentation

## 2013-06-24 DIAGNOSIS — F603 Borderline personality disorder: Secondary | ICD-10-CM | POA: Insufficient documentation

## 2013-06-24 DIAGNOSIS — F79 Unspecified intellectual disabilities: Secondary | ICD-10-CM | POA: Insufficient documentation

## 2013-06-24 DIAGNOSIS — D126 Benign neoplasm of colon, unspecified: Secondary | ICD-10-CM | POA: Insufficient documentation

## 2013-06-24 DIAGNOSIS — M129 Arthropathy, unspecified: Secondary | ICD-10-CM | POA: Insufficient documentation

## 2013-06-24 DIAGNOSIS — Z7982 Long term (current) use of aspirin: Secondary | ICD-10-CM | POA: Insufficient documentation

## 2013-06-24 DIAGNOSIS — J309 Allergic rhinitis, unspecified: Secondary | ICD-10-CM | POA: Insufficient documentation

## 2013-06-24 DIAGNOSIS — Z1211 Encounter for screening for malignant neoplasm of colon: Secondary | ICD-10-CM | POA: Insufficient documentation

## 2013-06-24 DIAGNOSIS — Z860100 Personal history of colon polyps, unspecified: Secondary | ICD-10-CM | POA: Diagnosis present

## 2013-06-24 DIAGNOSIS — E669 Obesity, unspecified: Secondary | ICD-10-CM | POA: Insufficient documentation

## 2013-06-24 DIAGNOSIS — I509 Heart failure, unspecified: Secondary | ICD-10-CM | POA: Insufficient documentation

## 2013-06-24 DIAGNOSIS — Z79899 Other long term (current) drug therapy: Secondary | ICD-10-CM | POA: Insufficient documentation

## 2013-06-24 DIAGNOSIS — K635 Polyp of colon: Secondary | ICD-10-CM | POA: Insufficient documentation

## 2013-06-24 DIAGNOSIS — K219 Gastro-esophageal reflux disease without esophagitis: Secondary | ICD-10-CM | POA: Insufficient documentation

## 2013-06-24 DIAGNOSIS — I10 Essential (primary) hypertension: Secondary | ICD-10-CM | POA: Insufficient documentation

## 2013-06-24 HISTORY — DX: Personal history of colonic polyps: Z86.010

## 2013-06-24 HISTORY — DX: Unspecified intellectual disabilities: F79

## 2013-06-24 HISTORY — DX: Personal history of colon polyps, unspecified: Z86.0100

## 2013-06-24 HISTORY — PX: COLONOSCOPY: SHX5424

## 2013-06-24 SURGERY — COLONOSCOPY
Anesthesia: Monitor Anesthesia Care

## 2013-06-24 MED ORDER — PROPOFOL 10 MG/ML IV BOLUS
INTRAVENOUS | Status: AC
  Filled 2013-06-24: qty 20

## 2013-06-24 MED ORDER — FENTANYL CITRATE 0.05 MG/ML IJ SOLN: 25.0000 ug | INTRAMUSCULAR | Status: DC | PRN

## 2013-06-24 MED ORDER — PROPOFOL INFUSION 10 MG/ML OPTIME
INTRAVENOUS | Status: DC | PRN
  Administered 2013-06-24: 140 ug/kg/min via INTRAVENOUS

## 2013-06-24 MED ORDER — KETAMINE HCL 10 MG/ML IJ SOLN
INTRAMUSCULAR | Status: AC
  Filled 2013-06-24: qty 1

## 2013-06-24 MED ORDER — SODIUM CHLORIDE 0.9 % IV SOLN: INTRAVENOUS | Status: DC

## 2013-06-24 MED ORDER — KETAMINE HCL 10 MG/ML IJ SOLN
INTRAMUSCULAR | Status: DC | PRN
Start: 2013-06-24 — End: 2013-06-24
  Administered 2013-06-24: 25 mg via INTRAVENOUS

## 2013-06-24 MED ORDER — LACTATED RINGERS IV SOLN: INTRAVENOUS | Status: DC

## 2013-06-24 MED ORDER — PROPOFOL 10 MG/ML IV BOLUS
INTRAVENOUS | Status: DC | PRN
  Administered 2013-06-24: 20 mg via INTRAVENOUS

## 2013-06-24 MED ORDER — SPOT INK MARKER SYRINGE KIT
PACK | SUBMUCOSAL | Status: AC
  Filled 2013-06-24: qty 5

## 2013-06-24 MED ORDER — ASPIRIN EC 81 MG PO TBEC: 81.0000 mg | DELAYED_RELEASE_TABLET | Freq: Every day | ORAL | Status: DC

## 2013-06-24 MED ORDER — LACTATED RINGERS IV SOLN
INTRAVENOUS | Status: DC
  Administered 2013-06-24: 1000 mL via INTRAVENOUS

## 2013-06-24 MED ORDER — SPOT INK MARKER SYRINGE KIT
PACK | SUBMUCOSAL | Status: DC | PRN
  Administered 2013-06-24: 3 mL via SUBMUCOSAL

## 2013-06-24 NOTE — H&P (Signed)
[facility] History and Physical   Primary Care Physician:  Minerva Ends, MD   Reason for Procedure:   Colon cancer screening   Plan:    Colonoscopy - The risks and benefits as well as alternatives of endoscopic procedure(s) have been discussed and reviewed. All questions answered. The patient/POA agrees to proceed.      HPI: Lauren Wood is a 53 y.o. female here for screening colonoscopy. She was seen in my office 05/20/2013 with her sister/POA and we arranged today's appointment. She tolerated prep well and is ready.   Past Medical History  Diagnosis Date  . ALLERGIC RHINITIS 08/02/2009  . PRESSURE ULCER OTHER SITE 10/01/2009    hx of  . Boil of vulva 11/07/2012  . HTN (hypertension)   . Hip pain 12/03/2012  . Arthritis   . Obesity   . Borderline personality disorder 05/31/2011  . Unspecified psychosis 12/20/2009  . Mental retardation     child like level  . Sleep apnea     uses cpap, setting of 2    Past Surgical History  Procedure Laterality Date  . Tympanostomy tube placement Bilateral yrs ago  . Cyst removed  2011    forehead    Prior to Admission medications   Medication Sig Start Date End Date Taking? Authorizing Provider  aspirin EC 81 MG tablet Take 81 mg by mouth daily.   Yes Historical Provider, MD  benztropine (COGENTIN) 0.5 MG tablet Take 0.25 mg by mouth 2 (two) times daily.   Yes Historical Provider, MD  buPROPion (WELLBUTRIN XL) 150 MG 24 hr tablet Take 150 mg by mouth every morning.   Yes Historical Provider, MD  carvedilol (COREG) 25 MG tablet Take 25 mg by mouth 2 (two) times daily with a meal.   Yes Historical Provider, MD  clonazePAM (KLONOPIN) 0.5 MG tablet Take 0.25 mg by mouth 2 (two) times daily as needed for anxiety (sleep).  04/11/13  Yes Historical Provider, MD  docusate sodium (COLACE) 100 MG capsule Take 200 mg by mouth 2 (two) times daily.   Yes Historical Provider, MD  ferrous sulfate 220 (44 FE) MG/5ML solution Take  220 mg by mouth daily.   Yes Historical Provider, MD  FLUoxetine (PROZAC) 40 MG capsule Take 40 mg by mouth every morning.   Yes Historical Provider, MD  furosemide (LASIX) 40 MG tablet Take 1 tablet (40 mg total) by mouth daily. 06/23/13  Yes Josalyn C Funches, MD  losartan (COZAAR) 25 MG tablet Take 25 mg by mouth 2 (two) times daily.   Yes Historical Provider, MD  mirtazapine (REMERON) 30 MG tablet Take 30 mg by mouth at bedtime.  04/11/13  Yes Historical Provider, MD  omeprazole (PRILOSEC) 20 MG capsule Take 40 mg by mouth daily.   Yes Historical Provider, MD  risperiDONE (RISPERDAL) 3 MG tablet Take 3 mg by mouth 2 (two) times daily.  04/26/13  Yes Historical Provider, MD  traZODone (DESYREL) 100 MG tablet Take 100 mg by mouth at bedtime.  07/14/12  Yes Historical Provider, MD  traMADol (ULTRAM) 50 MG tablet Take 50 mg by mouth every 6 (six) hours as needed for moderate pain.    Historical Provider, MD    Current Facility-Administered Medications  Medication Dose Route Frequency Provider Last Rate Last Dose  . fentaNYL (SUBLIMAZE) injection 25-50 mcg  25-50 mcg Intravenous Q5 min PRN Peyton Najjar, MD      . lactated ringers infusion   Intravenous Continuous Peyton Najjar,  MD 20 mL/hr at 06/24/13 1104 1,000 mL at 06/24/13 1104    Allergies as of 05/20/2013 - Review Complete 05/20/2013  Allergen Reaction Noted  . Enalapril maleate  08/02/2009    Family History  Problem Relation Age of Onset  . Colon cancer Father   . Diabetes Mother     History   Social History  . Marital Status: Single    Spouse Name: N/A    Number of Children: 0  . Years of Education: N/A   Occupational History  . Not on file.   Social History Main Topics  . Smoking status: Never Smoker   . Smokeless tobacco: Never Used  . Alcohol Use: No  . Drug Use: No  . Sexual Activity: No   Other Topics Concern  . Not on file   Social History Narrative   Goes to adult daycare.  Lives with sister.             Review of Systems: Positive for as per HPI .  Physical Exam: Vital signs in last 24 hours: Temp:  [98.3 F (36.8 C)] 98.3 F (36.8 C) (02/10 1054) Resp:  [18] 18 (02/10 1054) SpO2:  [96 %] 96 % (02/10 1054) Weight:  [376 lb (170.552 kg)] 376 lb (170.552 kg) (02/10 1054)   General:   Alert,  Well-developed, well-nourished, pleasant and cooperative in NAD Lungs:  Clear throughout to auscultation.   Heart:  Regular rate and rhythm; no murmurs, clicks, rubs,  or gallops. Abdomen:  Obese, Soft, nontender and nondistended. Normal bowel sounds.   Neuro/Psych:  Alert and cooperative. Normal mood and affect.  @Shawntavia Saunders  Simonne Maffucci, MD, [facility] [phone] (pager) 06/24/2013 11:20 AM@

## 2013-06-24 NOTE — Discharge Instructions (Addendum)
I found and removed 2 polyps - one was very large. They looked benign (not like cancer) but will be analyzed by the pathologist to tell me what type of polyps they are.  I will let you know pathology results and when to have another routine colonoscopy by mail.   I appreciate the opportunity to care for you. Lauren Mayer, MD, FACG  YOU HAD AN ENDOSCOPIC PROCEDURE TODAY: Refer to the procedure report and other information in the discharge instructions given to you for any specific questions about what was found during the examination. If this information does not answer your questions, please call Dr. Celesta Aver office at [phone] to clarify.   YOU SHOULD EXPECT: Some feelings of bloating in the abdomen. Passage of more gas than usual. Walking can help get rid of the air that was put into your GI tract during the procedure and reduce the bloating. If you had a lower endoscopy (such as a colonoscopy or flexible sigmoidoscopy) you may notice spotting of blood in your stool or on the toilet paper. Some abdominal soreness may be present for a day or two, also.  DIET: Your first meal following the procedure should be a light meal and then it is ok to progress to your normal diet. A half-sandwich or bowl of soup is an example of a good first meal. Heavy or fried foods are harder to digest and may make you feel nauseous or bloated. Drink plenty of fluids but you should avoid alcoholic beverages for 24 hours.   ACTIVITY: Your care partner should take you home directly after the procedure. You should plan to take it easy, moving slowly for the rest of the day. You can resume normal activity the day after the procedure however YOU SHOULD NOT DRIVE, use power tools, machinery or perform tasks that involve climbing or major physical exertion for 24 hours (because of the sedation medicines used during the test).   SYMPTOMS TO REPORT IMMEDIATELY: A gastroenterologist can be reached at any hour. Please call  [phone]  for any of the following symptoms:  Following lower endoscopy (colonoscopy, flexible sigmoidoscopy) Excessive amounts of blood in the stool  Significant tenderness, worsening of abdominal pains  Swelling of the abdomen that is new, acute  Fever of 100 or higher   FOLLOW UP:  If any biopsies were taken you will be contacted by phone or by letter within the next 1-3 weeks. Call [phone]  if you have not heard about the biopsies in 3 weeks.  Please also call with any specific questions about appointments or follow up tests.  Monitored Anesthesia Care  Monitored anesthesia care is an anesthesia service for a medical procedure. Anesthesia is the loss of the ability to feel pain. It is produced by medications called anesthetics. It may affect a small area of your body (local anesthesia), a large area of your body (regional anesthesia), or your entire body (general anesthesia). The need for monitored anesthesia care depends your procedure, your condition, and the potential need for regional or general anesthesia. It is often provided during procedures where:   General anesthesia may be needed if there are complications. This is because you need special care when you are under general anesthesia.   You will be under local or regional anesthesia. This is so that you are able to have higher levels of anesthesia if needed.   You will receive calming medications (sedatives). This is especially the case if sedatives are given to put you in a  semi-conscious state of relaxation (deep sedation). This is because the amount of sedative needed to produce this state can be hard to predict. Too much of a sedative can produce general anesthesia. Monitored anesthesia care is performed by one or more caregivers who have special training in all types of anesthesia. You will need to meet with these caregivers before your procedure. During this meeting, they will ask you about your medical history. They  will also give you instructions to follow. (For example, you will need to stop eating and drinking before your procedure. You may also need to stop or change medications you are taking.) During your procedure, your caregivers will stay with you. They will:   Watch your condition. This includes watching you blood pressure, breathing, and level of pain.   Diagnose and treat problems that occur.   Give medications if they are needed. These may include calming medications (sedatives) and anesthetics.   Make sure you are comfortable.  Having monitored anesthesia care does not necessarily mean that you will be under anesthesia. It does mean that your caregivers will be able to manage anesthesia if you need it or if it occurs. It also means that you will be able to have a different type of anesthesia than you are having if you need it. When your procedure is complete, your caregivers will continue to watch your condition. They will make sure any medications wear off before you are allowed to go home.  Document Released: 01/25/2005 Document Revised: 08/26/2012 Document Reviewed: 06/12/2012 [facility] Patient Information 2014 [place], [place].

## 2013-06-24 NOTE — Transfer of Care (Signed)
Immediate Anesthesia Transfer of Care Note  Patient: Lauren Wood  Procedure(s) Performed: Procedure(s) (LRB): COLONOSCOPY (N/A)  Patient Location: PACU  Anesthesia Type: MAC  Level of Consciousness: sedated, patient cooperative and responds to stimulation  Airway & Oxygen Therapy: Patient Spontanous Breathing and Patient connected to face mask oxgen  Post-op Assessment: Report given to PACU RN and Post -op Vital signs reviewed and stable  Post vital signs: Reviewed and stable  Complications: No apparent anesthesia complications

## 2013-06-24 NOTE — Op Note (Signed)
[facility] [place], [address]   COLONOSCOPY PROCEDURE REPORT  PATIENT: Lauren Wood, Lauren Wood  MR#: [id number] BIRTHDATE: [birth year] , 52  yrs. old GENDER: Female ENDOSCOPIST: Gatha Mayer, MD, Marval Regal REFERRED BY:  Boykin Nearing, MD PROCEDURE DATE:  06/24/2013 PROCEDURE:   Colonoscopy with snare polypectomy and Submucosal injection, any substance First Screening Colonoscopy - Avg.  risk and is 50 yrs.  old or older Yes.  Prior Negative Screening - Now for repeat screening. N/A  History of Adenoma - Now for follow-up colonoscopy & has been > or = to 3 yrs.  N/A  Polyps Removed Today? Yes. ASA CLASS:   Class IV INDICATIONS:average risk screening and first colonoscopy. MEDICATIONS: See Anesthesia Report.  DESCRIPTION OF PROCEDURE:   After the risks benefits and alternatives of the procedure were thoroughly explained, informed consent was obtained.  A digital rectal exam revealed no abnormalities of the rectum.   The Pentax Colonoscope [id number] endoscope was introduced through the anus and advanced to the cecum, which was identified by both the appendix and ileocecal valve. No adverse events experienced.   The quality of the prep was Suprep good  The instrument was then slowly withdrawn as the colon was fully examined.  COLON FINDINGS: Two polypoid shaped sessile polyps measuring 8 and 20 mm in size were found at the hepatic flexure (20) and in the descending colon(8).  A polypectomy was performed using snare cautery. Piecemeal on larger polyp.  The resection was complete and the polyp tissue was completely retrieved.  Injection (tattooing) was performed around the hepatic flexure site - with SPOT. Moderate diverticulosis was noted The finding was in the left colon.   The colon mucosa was otherwise normal.  Retroflexed views revealed no abnormalities. The time to cecum=8 minutes 0 seconds.  Withdrawal time=31 minutes 0 seconds.  The scope was withdrawn  and the procedure completed. COMPLICATIONS: There were no complications.  ENDOSCOPIC IMPRESSION: 1.   Two sessile polyps measuring 8 and 20 mm in size were found at the hepatic flexure (64mm) and in the descending colon (27mm); polypectomy was performed using snare cautery; Injection (tattooing) was performed 2.   Moderate diverticulosis was noted in the left colon 3.   The colon mucosa was otherwise normal - good prep  RECOMMENDATIONS: 1.  Hold aspirin, aspirin products, and anti-inflammatory medication for 2 weeks. 2.  Timing of repeat colonoscopy will be determined by pathology findings. 3.   Will likely require close f/u of hepatic flexure polyp later this year.   eSigned:  Gatha Mayer, MD, [facility] 06/24/2013 12:39 PM cc: The Patient  and Boykin Nearing, MD

## 2013-06-24 NOTE — Anesthesia Preprocedure Evaluation (Addendum)
Anesthesia Evaluation  Patient identified by MRN, date of birth, ID band Patient awake    Reviewed: Allergy & Precautions, H&P , NPO status , Patient's Chart, lab work & pertinent test results, reviewed documented beta blocker date and time   Airway Mallampati: III TM Distance: >3 FB Neck ROM: full    Dental no notable dental hx. (+) Teeth Intact and Dental Advisory Given   Pulmonary shortness of breath, sleep apnea and Continuous Positive Airway Pressure Ventilation ,  breath sounds clear to auscultation  Pulmonary exam normal       Cardiovascular Exercise Tolerance: Poor hypertension, Pt. on home beta blockers and Pt. on medications +CHF and + DOE Rhythm:regular Rate:Normal  LVH   Neuro/Psych psychosisMR. Borderline personality negative neurological ROS  negative psych ROS   GI/Hepatic negative GI ROS, Neg liver ROS, GERD-  Medicated and Controlled,  Endo/Other  negative endocrine ROSMorbid obesity  Renal/GU Renal diseasenegative Renal ROSStage 3 kidney disease  negative genitourinary   Musculoskeletal   Abdominal   Peds  Hematology negative hematology ROS (+)   Anesthesia Other Findings   Reproductive/Obstetrics negative OB ROS                          Anesthesia Physical Anesthesia Plan  ASA: IV  Anesthesia Plan: MAC   Post-op Pain Management:    Induction:   Airway Management Planned: Simple Face Mask  Additional Equipment:   Intra-op Plan:   Post-operative Plan:   Informed Consent: I have reviewed the patients History and Physical, chart, labs and discussed the procedure including the risks, benefits and alternatives for the proposed anesthesia with the patient or authorized representative who has indicated his/her understanding and acceptance.   Dental Advisory Given  Plan Discussed with: CRNA and Surgeon  Anesthesia Plan Comments:         Anesthesia Quick  Evaluation

## 2013-06-24 NOTE — Anesthesia Postprocedure Evaluation (Signed)
  Anesthesia Post-op Note  Patient: Lauren Wood  Procedure(s) Performed: Procedure(s) (LRB): COLONOSCOPY (N/A)  Patient Location: PACU  Anesthesia Type: MAC  Level of Consciousness: awake and alert   Airway and Oxygen Therapy: Patient Spontanous Breathing  Post-op Pain: mild  Post-op Assessment: Post-op Vital signs reviewed, Patient's Cardiovascular Status Stable, Respiratory Function Stable, Patent Airway and No signs of Nausea or vomiting  Last Vitals:  Filed Vitals:   06/24/13 1239  BP: 146/94  Pulse: 76  Temp:   Resp:     Post-op Vital Signs: stable   Complications: No apparent anesthesia complications

## 2013-06-25 ENCOUNTER — Encounter: Payer: Self-pay | Admitting: Family Medicine

## 2013-06-26 ENCOUNTER — Encounter: Payer: Self-pay | Admitting: Internal Medicine

## 2013-06-26 ENCOUNTER — Encounter (HOSPITAL_COMMUNITY): Payer: Self-pay | Admitting: Internal Medicine

## 2013-06-26 NOTE — Progress Notes (Signed)
Quick Note:  Repeat colonoscopy July/Aug 2015 as one polyp 20 mm and needs closer f/u ______

## 2013-07-14 ENCOUNTER — Ambulatory Visit (INDEPENDENT_AMBULATORY_CARE_PROVIDER_SITE_OTHER): Payer: Medicare Other | Admitting: Family Medicine

## 2013-07-14 ENCOUNTER — Encounter: Payer: Self-pay | Admitting: Family Medicine

## 2013-07-14 ENCOUNTER — Other Ambulatory Visit: Payer: Self-pay | Admitting: Family Medicine

## 2013-07-14 ENCOUNTER — Telehealth: Payer: Self-pay | Admitting: Family Medicine

## 2013-07-14 VITALS — BP 146/84 | HR 75 | Temp 99.1°F | Wt 365.0 lb

## 2013-07-14 DIAGNOSIS — I10 Essential (primary) hypertension: Secondary | ICD-10-CM

## 2013-07-14 DIAGNOSIS — N898 Other specified noninflammatory disorders of vagina: Secondary | ICD-10-CM

## 2013-07-14 DIAGNOSIS — I5030 Unspecified diastolic (congestive) heart failure: Secondary | ICD-10-CM

## 2013-07-14 DIAGNOSIS — I509 Heart failure, unspecified: Secondary | ICD-10-CM

## 2013-07-14 LAB — BASIC METABOLIC PANEL
BUN: 12 mg/dL (ref 6–23)
CO2: 31 mEq/L (ref 19–32)
Calcium: 9.9 mg/dL (ref 8.4–10.5)
Chloride: 99 mEq/L (ref 96–112)
Creat: 0.98 mg/dL (ref 0.50–1.10)
Glucose, Bld: 86 mg/dL (ref 70–99)
Potassium: 3.7 mEq/L (ref 3.5–5.3)
Sodium: 143 mEq/L (ref 135–145)

## 2013-07-14 LAB — POCT WET PREP (WET MOUNT): Clue Cells Wet Prep Whiff POC: NEGATIVE

## 2013-07-14 LAB — IRON AND TIBC
%SAT: 15 % — ABNORMAL LOW (ref 20–55)
Iron: 56 ug/dL (ref 42–145)
TIBC: 363 ug/dL (ref 250–470)
UIBC: 307 ug/dL (ref 125–400)

## 2013-07-14 LAB — FERRITIN: Ferritin: 102 ng/mL (ref 10–291)

## 2013-07-14 MED ORDER — HUMIDIFIER/VAPORIZER SUPPLIES MISC: 1.0000 | Freq: Every day | Status: DC

## 2013-07-14 MED ORDER — HYDROCHLOROTHIAZIDE 25 MG PO TABS: 25.0000 mg | ORAL_TABLET | Freq: Every day | ORAL | Status: DC

## 2013-07-14 NOTE — Patient Instructions (Addendum)
Thank you ladies for coming in today.  I will call with wet prep results. The discharge looks like bacterial vaginosis which is treated with flagyl followed by diflucan to prevent yeast.  Awesome job with weight loss! Continue current regimen. I will be making some changes to your blood pressure medication regimen. I will call with specific changes after I review your blood work.   Please schedule f/u with Dr. Percival Spanish for comments on  Dr. Adrian Blackwater

## 2013-07-14 NOTE — Assessment & Plan Note (Addendum)
A: improved since 12/14 exacerbation. sats 89 on RA during activity. 92 with 2 L [place]. P: Continue supplemental O2 during activity Lasix 40 daily proBNP and BMP today.  CPAP Treat HTN: add HCTZ 25 mg daily for goal BP < 140/90 consistently.  Considered potassium sparing. diuretic but concerned about ability to f/u and monitor potassium.  Instructed patient to f/u with cardiology since she has not completely returned to previous baseline from 12/14 exacerbation. This may be her new baseline.

## 2013-07-14 NOTE — Progress Notes (Signed)
   Subjective:    Patient ID: Lauren Wood, female    DOB: [birth year], 54 y.o.   MRN: [id number] CC: CHF f/u and vaginal discharge.  HPI 53 yo F presents for f/u visit:  1. Diastolic CHF: patient still on 2 L supplemental O2. She reports improvement in SOB but still experience SOB when walking on flat surfaces. She denies syncope, CP, SOB at rest. Her sister has made significant changes to their diet including.   2. Obesity: compliant with low fat, low sugar diet. Patient does not notice weight loss, but does notice less SOB with exertion. Not exercising.   3. HTN: compliant with lasix 40 daily, cozaar 25 mg BID, coreg 25 mg BID. Denies HA, vision changes, CP.   4. Sleep apnea: compliant with home CPAP.   5. Vaginal discharge: Panning and yellow discharge noted one day ago. No itching or lesions. No recent antibiotics.    Review of Systems     Objective:   Physical Exam BP 164/97  Pulse 75  Temp(Src) 99.1 F (37.3 C) (Oral)  Wt 365 lb (165.563 kg)  SpO2 89%  LMP 05/08/2013 (sat 92 % on 2 L [place], 89% on RA after ambulation)  General appearance: alert, cooperative, no distress and morbidly obese Lungs: clear to auscultation bilaterally Heart: regular rate and rhythm, S1, S2 normal, no murmur, click, rub or gallop Abdomen: obese,  Pelvic: cervix normal in appearance, external genitalia normal, no adnexal masses or tenderness, no cervical motion tenderness, positive findings: vaginal discharge:  normal and physiologic, rectovaginal septum normal and uterus normal size, shape, and consistency Extremities: extremities normal, atraumatic, no cyanosis or edema  Wet prep: negative.      Assessment & Plan:

## 2013-07-14 NOTE — Assessment & Plan Note (Signed)
A: improved.  P: add HCTZ 25 mg daily for goal BP < 140/90 consistently.  Considered potassium sparing. diuretic but concerned about ability to f/u and monitor potassium.

## 2013-07-14 NOTE — Assessment & Plan Note (Signed)
A: improved.  P: Congratulated Continue trend

## 2013-07-14 NOTE — Assessment & Plan Note (Signed)
Suspect physiologic. Scant d/c. No symptoms. One day. Negative wet prep.

## 2013-07-14 NOTE — Telephone Encounter (Signed)
Called [employer]. Negative wet prep Normal BMP. Start HCTZ 25 mg daily for BP control

## 2013-07-15 LAB — PRO B NATRIURETIC PEPTIDE: Pro B Natriuretic peptide (BNP): 438.7 pg/mL — ABNORMAL HIGH (ref <126)

## 2013-07-21 ENCOUNTER — Telehealth: Payer: Self-pay | Admitting: Family Medicine

## 2013-07-21 NOTE — Telephone Encounter (Signed)
Pt called and needs a refill on her Risperdal called in. jw

## 2013-07-21 NOTE — Telephone Encounter (Signed)
Please call patient back to clarify prescriber. I have not filled risperdal in the past. This medication is filled by her mental health provider.

## 2013-07-22 NOTE — Telephone Encounter (Signed)
Spoke with sister arlean and she is aware of this.  Will contact this office to get this filled. Coyle Stordahl,CMA

## 2013-08-06 ENCOUNTER — Other Ambulatory Visit: Payer: Self-pay | Admitting: Family Medicine

## 2013-08-08 ENCOUNTER — Other Ambulatory Visit: Payer: Self-pay | Admitting: Family Medicine

## 2013-09-08 ENCOUNTER — Other Ambulatory Visit: Payer: Self-pay | Admitting: Family Medicine

## 2013-09-25 ENCOUNTER — Telehealth: Payer: Self-pay | Admitting: Family Medicine

## 2013-09-25 ENCOUNTER — Ambulatory Visit (INDEPENDENT_AMBULATORY_CARE_PROVIDER_SITE_OTHER): Payer: Medicare Other | Admitting: Family Medicine

## 2013-09-25 ENCOUNTER — Encounter: Payer: Self-pay | Admitting: Family Medicine

## 2013-09-25 VITALS — BP 133/79 | HR 94 | Temp 98.2°F | Ht 65.0 in | Wt 345.0 lb

## 2013-09-25 DIAGNOSIS — M79609 Pain in unspecified limb: Secondary | ICD-10-CM

## 2013-09-25 DIAGNOSIS — E790 Hyperuricemia without signs of inflammatory arthritis and tophaceous disease: Secondary | ICD-10-CM

## 2013-09-25 DIAGNOSIS — R7989 Other specified abnormal findings of blood chemistry: Secondary | ICD-10-CM

## 2013-09-25 DIAGNOSIS — B351 Tinea unguium: Secondary | ICD-10-CM

## 2013-09-25 DIAGNOSIS — M79672 Pain in left foot: Secondary | ICD-10-CM

## 2013-09-25 LAB — BASIC METABOLIC PANEL
BUN: 18 mg/dL (ref 6–23)
CO2: 29 mEq/L (ref 19–32)
Calcium: 9.5 mg/dL (ref 8.4–10.5)
Chloride: 100 mEq/L (ref 96–112)
Creat: 1.19 mg/dL — ABNORMAL HIGH (ref 0.50–1.10)
Glucose, Bld: 83 mg/dL (ref 70–99)
Potassium: 4.3 mEq/L (ref 3.5–5.3)
Sodium: 139 mEq/L (ref 135–145)

## 2013-09-25 LAB — CBC
HCT: 34.9 % — ABNORMAL LOW (ref 36.0–46.0)
Hemoglobin: 11.7 g/dL — ABNORMAL LOW (ref 12.0–15.0)
MCH: 27.3 pg (ref 26.0–34.0)
MCHC: 33.5 g/dL (ref 30.0–36.0)
MCV: 81.4 fL (ref 78.0–100.0)
Platelets: 270 10*3/uL (ref 150–400)
RBC: 4.29 MIL/uL (ref 3.87–5.11)
RDW: 15.6 % — ABNORMAL HIGH (ref 11.5–15.5)
WBC: 8.5 10*3/uL (ref 4.0–10.5)

## 2013-09-25 LAB — URIC ACID: Uric Acid, Serum: 10.3 mg/dL — ABNORMAL HIGH (ref 2.4–7.0)

## 2013-09-25 LAB — POCT SEDIMENTATION RATE: POCT SED RATE: 67 mm/hr — AB (ref 0–22)

## 2013-09-25 MED ORDER — COLCHICINE 0.6 MG PO TABS: ORAL_TABLET | ORAL | Status: DC

## 2013-09-25 NOTE — Assessment & Plan Note (Signed)
I am sorry that your L foot is painful. I am most concerned about pain related to edema and pain related to possible gout. You have no history of gout, you are on some medications that can cause gout: lasix and HCTZ.  For now, blood work. Start colchicine 1.2 mg x one now, then 0.6 mg one hour later. I will call with lab results tomorrow.

## 2013-09-25 NOTE — Telephone Encounter (Signed)
Podiatry referral placed. Please inform patient.

## 2013-09-25 NOTE — Telephone Encounter (Signed)
Pt called and needs a referral for the foot doctor. jw

## 2013-09-25 NOTE — Patient Instructions (Addendum)
Lauren Wood,  Thank you for coming in today. I am sorry that your L foot is painful. I am most concerned about pain related to edema and pain related to possible gout. You have no history of gout, you are on some medications that can cause gout: lasix and HCTZ.  For now, blood work. Start colchicine 1.2 mg x one now, then 0.6 mg one hour later. I will call with lab results tomorrow.   Dr. Adrian Blackwater

## 2013-09-25 NOTE — Progress Notes (Signed)
   Subjective:    Patient ID: Lauren Wood, female    DOB: 03/07/1961, 53 y.o.   MRN: [id number] CC: foot swelling  HPI 53 yo F with CHF, morbid obesity, HTN on lasix and HCTZ presents for SD visit with 3 days of L foot pain and swelling. Symptoms started acutely. No trauma. Swelling is improving with cold compress and elevation. No fever or chills.   Soc Hx: non smoker  Review of Systems As per HPI     Objective:   Physical Exam BP 133/79  Pulse 94  Temp(Src) 98.2 F (36.8 C) (Oral)  Ht 5\' 5"  (1.651 m)  Wt 345 lb (156.491 kg)  BMI 57.41 kg/m2  LMP 05/08/2013 General appearance: alert, cooperative and no distress on 2 L [place]  Lungs: clear to auscultation bilaterally Heart: regular rate and rhythm, S1, S2 normal, no murmur, click, rub or gallop Extremities:  No trauma, trace edema,  No foot lesions, toenails are thick and long.  R foot: swollen on dorsum compared to L, tender, no erythema, full ROM, no great toe pain or swelling.      Assessment & Plan:

## 2013-09-26 ENCOUNTER — Telehealth: Payer: Self-pay | Admitting: Family Medicine

## 2013-09-26 DIAGNOSIS — M109 Gout, unspecified: Secondary | ICD-10-CM | POA: Insufficient documentation

## 2013-09-26 HISTORY — DX: Gout, unspecified: M10.9

## 2013-09-26 MED ORDER — COLCHICINE 0.6 MG PO TABS: ORAL_TABLET | ORAL | Status: DC

## 2013-09-26 MED ORDER — HYDROCHLOROTHIAZIDE 12.5 MG PO TABS
12.5000 mg | ORAL_TABLET | Freq: Every day | ORAL | Status: DC
Start: 2013-09-26 — End: 2013-10-09

## 2013-09-26 NOTE — Telephone Encounter (Signed)
Called patient's sister. Reviewed labs. Gouty arthritis most likely cause of L foot pain and swelling. Plan for colchicine BID until pain resolves, then once daily. Half HCTZ to 12.5 mg PO daily. Repeat uric acid and BMP in 3 weeks.

## 2013-09-26 NOTE — Assessment & Plan Note (Signed)
A: Lt dorsal foot pain, hyperuricemia, elevated sed rate. Concern for gouty arthritis. P:  Decrease HCTZ to 12.5 mg Low purine diet.  Continue colchine BID until pain resolves, then once daily to prevent recurrence Repeat uric acid and BMP in 2-3 weeks.

## 2013-09-26 NOTE — Addendum Note (Signed)
Addended by: Boykin Nearing on: 09/26/2013 11:14 AM   Modules accepted: Orders

## 2013-09-26 NOTE — Addendum Note (Signed)
Addended by: Boykin Nearing on: 09/26/2013 11:08 AM   Modules accepted: Orders

## 2013-10-08 ENCOUNTER — Ambulatory Visit: Payer: Medicare Other | Admitting: Family Medicine

## 2013-10-09 ENCOUNTER — Ambulatory Visit (INDEPENDENT_AMBULATORY_CARE_PROVIDER_SITE_OTHER): Payer: Medicare Other | Admitting: Family Medicine

## 2013-10-09 ENCOUNTER — Encounter: Payer: Self-pay | Admitting: Podiatry

## 2013-10-09 ENCOUNTER — Encounter: Payer: Self-pay | Admitting: Family Medicine

## 2013-10-09 ENCOUNTER — Ambulatory Visit: Payer: Self-pay | Admitting: Podiatry

## 2013-10-09 ENCOUNTER — Ambulatory Visit (INDEPENDENT_AMBULATORY_CARE_PROVIDER_SITE_OTHER): Payer: Medicare Other | Admitting: Podiatry

## 2013-10-09 VITALS — BP 125/88 | HR 83 | Temp 99.3°F | Ht 64.0 in | Wt 336.0 lb

## 2013-10-09 DIAGNOSIS — M109 Gout, unspecified: Secondary | ICD-10-CM

## 2013-10-09 DIAGNOSIS — E790 Hyperuricemia without signs of inflammatory arthritis and tophaceous disease: Secondary | ICD-10-CM

## 2013-10-09 DIAGNOSIS — M79609 Pain in unspecified limb: Secondary | ICD-10-CM

## 2013-10-09 DIAGNOSIS — B351 Tinea unguium: Secondary | ICD-10-CM

## 2013-10-09 DIAGNOSIS — M79672 Pain in left foot: Secondary | ICD-10-CM

## 2013-10-09 DIAGNOSIS — R7989 Other specified abnormal findings of blood chemistry: Secondary | ICD-10-CM

## 2013-10-09 LAB — BASIC METABOLIC PANEL
BUN: 29 mg/dL — ABNORMAL HIGH (ref 6–23)
CO2: 31 mEq/L (ref 19–32)
Calcium: 9.8 mg/dL (ref 8.4–10.5)
Chloride: 97 mEq/L (ref 96–112)
Creat: 1.45 mg/dL — ABNORMAL HIGH (ref 0.50–1.10)
Glucose, Bld: 92 mg/dL (ref 70–99)
Potassium: 3.4 mEq/L — ABNORMAL LOW (ref 3.5–5.3)
Sodium: 141 mEq/L (ref 135–145)

## 2013-10-09 LAB — URIC ACID: Uric Acid, Serum: 13.1 mg/dL — ABNORMAL HIGH (ref 2.4–7.0)

## 2013-10-09 LAB — POCT SEDIMENTATION RATE: POCT SED RATE: 65 mm/hr — AB (ref 0–22)

## 2013-10-09 MED ORDER — COLCHICINE 0.6 MG PO TABS
0.6000 mg | ORAL_TABLET | Freq: Every day | ORAL | Status: DC
Start: 2013-10-09 — End: 2013-11-04

## 2013-10-09 NOTE — Patient Instructions (Signed)
Lauren Wood,  Thank you for coming in today.  Awesome job with weight loss! Keep up the good work and healthy habits.   For gout: 1. Stop HCTZ completely. I have this medication with your pharmacy 2. Continue colchicine once daily until uric acid normalizes to prevent flares.  F/u blood work in 3 weeks   Dr. Adrian Blackwater

## 2013-10-09 NOTE — Assessment & Plan Note (Signed)
For gout: 1. Stop HCTZ completely. I have this medication with your pharmacy 2. Continue colchicine once daily until uric acid normalizes to prevent flares. Repeat uric acid, sed rate, BMP today   F/u blood work in 3 weeks

## 2013-10-09 NOTE — Progress Notes (Signed)
   Subjective:    Patient ID: Lauren Wood, female    DOB: 1960-11-29, 53 y.o.   MRN: [id number] CC: gout f/u HPI 1. Gout: improved. No L foot pain. Pain improved with colchicine after 4 days. No longer taking colchicine. Taking HCTZ 12.5 mg daily, down from 25 mg daily. Losing weight by cutting out sugar sweetened drinks.   Soc Hx: non smoker  Review of Systems As per HPI     Objective:   Physical Exam BP 125/88  Pulse 83  Temp(Src) 99.3 F (37.4 C) (Oral)  Ht 5\' 4"  (1.626 m)  Wt 336 lb (152.409 kg)  BMI 57.65 kg/m2  SpO2 100%  LMP 05/08/2013 BP Readings from Last 3 Encounters:  10/09/13 125/88  09/25/13 133/79  07/14/13 146/84   Wt Readings from Last 3 Encounters:  10/09/13 336 lb (152.409 kg)  09/25/13 345 lb (156.491 kg)  07/14/13 365 lb (165.563 kg)  General appearance: alert, cooperative and no distress, morbidly obese  Extremities: trace edema. L foot w/o erythema or warmth,  mild TTP on dorsum of foot distally. No 1st MTP tenderness.        Assessment & Plan:

## 2013-10-09 NOTE — Progress Notes (Signed)
   Subjective:    Patient ID: Lauren Wood, female    DOB: 1960/11/17, 53 y.o.   MRN: [id number]  HPI Comments: "I need the toenails cut"  Patient states that she needs her toenails cut. The toenails are long and thick. She is unable to trim. She says they are not painful.     Review of Systems  Constitutional: Positive for appetite change and unexpected weight change.  Cardiovascular: Positive for leg swelling.  Musculoskeletal: Positive for arthralgias.  Psychiatric/Behavioral: Positive for behavioral problems.  All other systems reviewed and are negative.      Objective:   Physical Exam        Assessment & Plan:

## 2013-10-09 NOTE — Progress Notes (Signed)
Subjective:     Patient ID: Lauren Wood, female   DOB: Jan 14, 1961, 53 y.o.   MRN: [id number]  HPI patient presents with nail disease 1-5 both feet that are thick and impossible for her to cut with patient being obese and cannot reach nailbed. Also become painful   Review of Systems  All other systems reviewed and are negative.      Objective:   Physical Exam  Nursing note and vitals reviewed. Constitutional: She is oriented to person, place, and time.  Cardiovascular: Intact distal pulses.   Musculoskeletal: Normal range of motion.  Neurological: She is oriented to person, place, and time.  Skin: Skin is warm.   neurovascular status is intact with patient having diminished range of motion subtalar midtarsal joint and diminished muscle strength. Patient's feet do have moderate edema in them and into the ankles but negative Homans sign was noted in the severe thickening incurvation and pain with brittle nailbeds 1-5 both feet     Assessment:     Painful mycotic nails with pain 1-5 both feet    Plan:     Debridement painful nail bed 1-5 both feet with no bleeding noted

## 2013-10-10 ENCOUNTER — Telehealth: Payer: Self-pay | Admitting: Family Medicine

## 2013-10-10 DIAGNOSIS — M109 Gout, unspecified: Secondary | ICD-10-CM

## 2013-10-10 MED ORDER — ALLOPURINOL 100 MG PO TABS: 100.0000 mg | ORAL_TABLET | Freq: Every day | ORAL | Status: DC

## 2013-10-10 NOTE — Telephone Encounter (Signed)
Called patient. Spoke to her sister/HCPOA Lauren Wood Discussed labs and plan.  A: persistent hyperuricemia.  P: 1. Avoid agents that may promote hyperuricemia/incident gout that should be avoided, when possible, include thiazide or loop diuretics, angiotensin-converting enzyme (ACE) inhibitors, non-losartan angiotensin II receptor blockers, and beta blocker 2. Stop lasix all together 3. Continue colcihine 4. Allopurinol 100 mg daily.

## 2013-10-10 NOTE — Assessment & Plan Note (Signed)
A: persistent hyperuricemia.  P: 1. Avoid agents that may promote hyperuricemia/incident gout that should be avoided, when possible, include thiazide or loop diuretics, angiotensin-converting enzyme (ACE) inhibitors, non-losartan angiotensin II receptor blockers, and beta blocker 2. Stop lasix all together 3. Continue colcihine 4. Allopurinol 100 mg daily.

## 2013-10-25 ENCOUNTER — Other Ambulatory Visit: Payer: Self-pay | Admitting: Family Medicine

## 2013-10-30 ENCOUNTER — Ambulatory Visit: Payer: Medicare Other | Admitting: Family Medicine

## 2013-11-03 ENCOUNTER — Ambulatory Visit (INDEPENDENT_AMBULATORY_CARE_PROVIDER_SITE_OTHER): Payer: Medicare Other | Admitting: Family Medicine

## 2013-11-03 ENCOUNTER — Encounter: Payer: Self-pay | Admitting: Family Medicine

## 2013-11-03 VITALS — BP 141/84 | HR 73 | Temp 98.5°F | Wt 352.0 lb

## 2013-11-03 DIAGNOSIS — E790 Hyperuricemia without signs of inflammatory arthritis and tophaceous disease: Secondary | ICD-10-CM

## 2013-11-03 DIAGNOSIS — R0609 Other forms of dyspnea: Secondary | ICD-10-CM

## 2013-11-03 DIAGNOSIS — I509 Heart failure, unspecified: Secondary | ICD-10-CM

## 2013-11-03 DIAGNOSIS — R7989 Other specified abnormal findings of blood chemistry: Secondary | ICD-10-CM

## 2013-11-03 DIAGNOSIS — I10 Essential (primary) hypertension: Secondary | ICD-10-CM

## 2013-11-03 DIAGNOSIS — M1A2720 Drug-induced chronic gout, left ankle and foot, without tophus (tophi): Secondary | ICD-10-CM

## 2013-11-03 DIAGNOSIS — I5030 Unspecified diastolic (congestive) heart failure: Secondary | ICD-10-CM

## 2013-11-03 DIAGNOSIS — M79609 Pain in unspecified limb: Secondary | ICD-10-CM

## 2013-11-03 DIAGNOSIS — M79672 Pain in left foot: Secondary | ICD-10-CM

## 2013-11-03 DIAGNOSIS — R0600 Dyspnea, unspecified: Secondary | ICD-10-CM

## 2013-11-03 DIAGNOSIS — R0989 Other specified symptoms and signs involving the circulatory and respiratory systems: Secondary | ICD-10-CM

## 2013-11-03 DIAGNOSIS — M1A00X0 Idiopathic chronic gout, unspecified site, without tophus (tophi): Secondary | ICD-10-CM

## 2013-11-03 LAB — BASIC METABOLIC PANEL
BUN: 15 mg/dL (ref 6–23)
CO2: 26 mEq/L (ref 19–32)
Calcium: 9.2 mg/dL (ref 8.4–10.5)
Chloride: 106 mEq/L (ref 96–112)
Creat: 1.12 mg/dL — ABNORMAL HIGH (ref 0.50–1.10)
Glucose, Bld: 90 mg/dL (ref 70–99)
Potassium: 3.9 mEq/L (ref 3.5–5.3)
Sodium: 142 mEq/L (ref 135–145)

## 2013-11-03 LAB — URIC ACID: Uric Acid, Serum: 5.6 mg/dL (ref 2.4–7.0)

## 2013-11-03 MED ORDER — SPIRONOLACTONE 25 MG PO TABS: 25.0000 mg | ORAL_TABLET | Freq: Every day | ORAL | Status: DC

## 2013-11-03 MED ORDER — ALLOPURINOL 100 MG PO TABS: 200.0000 mg | ORAL_TABLET | Freq: Every day | ORAL | Status: DC

## 2013-11-03 NOTE — Patient Instructions (Addendum)
Lauren Wood,  Thank you for coming in today.  For weight gain, SOB with lying flat. Please start spironolactone 25 mg once daily. Must come back for lab appt on 6/26-6/30  to check potassium. This is very important as potassium can get dangerously high on spironolactone.  Please participate in walking most days of the week.  For gout: taking allopurinol. Increase to 200 mg once daily.   F/u appt in 2 weeks. You will meet your new MD then.   Dr. Adrian Blackwater

## 2013-11-03 NOTE — Assessment & Plan Note (Signed)
A: greatly improved. Patient no longer endorses DOE. P: Supplemental O2 prn only with CPAP at night.

## 2013-11-03 NOTE — Assessment & Plan Note (Signed)
A: slight decline off lasix and HCTZ which were both d/cd due to elevated uric acid. P: Continue losartan and coreg Add aldactone 25 mg once daily.

## 2013-11-03 NOTE — Assessment & Plan Note (Signed)
A: declined with weight gain and orthopnea. This is in the setting of stopping lasix 2/2 elevated uric acid/L foot gout. P: Start aldactone 25 mg daily. Repeat BMP to check K+ in one week.  Advised regular exercise Patient compliant with low carb diet.

## 2013-11-03 NOTE — Assessment & Plan Note (Addendum)
A: declined with weight gain off lasix and 3-4 pillow orthopnea. P: Check BMP Add spirolactone.  Discussed the importance of regularly checking potassium. Plan for repeat potassium in 4-7 days.  Encouraged exercise.

## 2013-11-03 NOTE — Progress Notes (Signed)
   Subjective:    Patient ID: Lauren Wood, female    DOB: 04/11/1961, 53 y.o.   MRN: [id number] CC: f/u gout and weight/edema HPI 53 yo F with NYHA class 1 diastolic CHF and morbid obesity on 2 L supplemental O2, and OSA on CPAP presents for f/u visit:  1. Breathing: stable on 2 L O2. Not very active at home and work. Denies SOB. Sleeps on 4 pillows at home. Using CPAP at night.   2. Weight: weight is up. Compliant with diet. Only medication change was d/c lasix due to hyperuricemia at the last visit and added daily allopurinol.   3. Gout: denies foot pain. Complaint with allopurinol 100 mg once daily and colchicine 0.6 mg once daily. Has not yet been to the podiatrist but has new shoes which are very comfortable.   Soc Hx: non smoker  Review of Systems As per HPI     Objective:   Physical Exam BP 141/84  Pulse 73  Temp(Src) 98.5 F (36.9 C) (Oral)  Wt 352 lb (159.666 kg)  SpO2 97%  LMP 05/08/2013 O2 sat was 92 % ambulating on RA  BP Readings from Last 3 Encounters:  11/03/13 141/84  10/09/13 125/88  09/25/13 133/79   Wt Readings from Last 3 Encounters:  11/03/13 352 lb (159.666 kg)  10/09/13 336 lb (152.409 kg)  09/25/13 345 lb (156.491 kg)  General appearance: alert, cooperative and no distress, orthopnea noted when lying flat on one pillow.  Lungs: clear to auscultation bilaterally Heart: regular rate and rhythm, S1, S2 normal, no murmur, click, rub or gallop Ext: trace LE edema.     Assessment & Plan:

## 2013-11-03 NOTE — Assessment & Plan Note (Addendum)
A: asymptomatic. P: Check uric acid today. Continue once daily colchicine. Increase allopurinol to 200 mg once daily.  Goal uric acid < 6.0  F/u Uric acid 5.6 Will d/c daily colchicine and allopurinol. Have patient keep for prn use. Called patient's pharmacy to communicate medication change.

## 2013-11-04 NOTE — Addendum Note (Signed)
Addended by: Boykin Nearing on: 11/04/2013 09:31 AM   Modules accepted: Orders, Medications

## 2013-11-10 ENCOUNTER — Other Ambulatory Visit: Payer: Medicare Other

## 2013-11-11 ENCOUNTER — Other Ambulatory Visit: Payer: Medicare Other

## 2013-11-11 DIAGNOSIS — I5030 Unspecified diastolic (congestive) heart failure: Secondary | ICD-10-CM

## 2013-11-11 LAB — BASIC METABOLIC PANEL
BUN: 13 mg/dL (ref 6–23)
CO2: 29 mEq/L (ref 19–32)
Calcium: 9.1 mg/dL (ref 8.4–10.5)
Chloride: 102 mEq/L (ref 96–112)
Creat: 1.02 mg/dL (ref 0.50–1.10)
Glucose, Bld: 103 mg/dL — ABNORMAL HIGH (ref 70–99)
Potassium: 3.4 mEq/L — ABNORMAL LOW (ref 3.5–5.3)
Sodium: 141 mEq/L (ref 135–145)

## 2013-11-11 NOTE — Progress Notes (Signed)
BMP DONE TODAY Lauren Wood 

## 2013-12-01 ENCOUNTER — Other Ambulatory Visit: Payer: Self-pay | Admitting: Family Medicine

## 2013-12-10 ENCOUNTER — Ambulatory Visit: Payer: Medicare Other | Admitting: Podiatry

## 2013-12-17 ENCOUNTER — Encounter: Payer: Self-pay | Admitting: Internal Medicine

## 2013-12-30 ENCOUNTER — Other Ambulatory Visit: Payer: Self-pay | Admitting: Family Medicine

## 2014-01-15 ENCOUNTER — Other Ambulatory Visit: Payer: Self-pay | Admitting: *Deleted

## 2014-01-15 MED ORDER — FERROUS SULFATE 220 (44 FE) MG/5ML PO ELIX: ORAL_SOLUTION | ORAL | Status: DC

## 2014-01-26 ENCOUNTER — Other Ambulatory Visit: Payer: Self-pay

## 2014-01-26 DIAGNOSIS — Z1231 Encounter for screening mammogram for malignant neoplasm of breast: Secondary | ICD-10-CM

## 2014-02-04 ENCOUNTER — Other Ambulatory Visit: Payer: Self-pay | Admitting: Cardiology

## 2014-02-04 ENCOUNTER — Telehealth: Payer: Self-pay | Admitting: Family Medicine

## 2014-02-05 ENCOUNTER — Other Ambulatory Visit: Payer: Self-pay

## 2014-02-05 ENCOUNTER — Ambulatory Visit
Admission: RE | Admit: 2014-02-05 | Discharge: 2014-02-05 | Disposition: A | Discharge status: Home / Self Care | Payer: Medicare Other | Source: Ambulatory Visit

## 2014-02-05 DIAGNOSIS — Z1231 Encounter for screening mammogram for malignant neoplasm of breast: Secondary | ICD-10-CM

## 2014-02-18 ENCOUNTER — Other Ambulatory Visit: Payer: Self-pay | Admitting: Family Medicine

## 2014-02-25 ENCOUNTER — Other Ambulatory Visit: Payer: Self-pay | Admitting: Family Medicine

## 2014-02-25 NOTE — Telephone Encounter (Signed)
Mother called because she said that her daughter needs a refill on her fluid medication. She couldn't remember the name but said that the pharmacy stated that we have not responded to the refill request. jw

## 2014-02-25 NOTE — Telephone Encounter (Signed)
We do not have any refill request in chart.  Assume pt is talking about Spironolactone, will forward to MD. Clinton Sawyer, Salome Spotted

## 2014-02-26 MED ORDER — SPIRONOLACTONE 25 MG PO TABS: ORAL_TABLET | ORAL | Status: DC

## 2014-02-26 NOTE — Telephone Encounter (Signed)
Refill X1 month; needs BMET repeated after that time [facility], MD

## 2014-02-26 NOTE — Telephone Encounter (Signed)
Sister informed and appt made for 03/18/2014. Litzy Dicker, Salome Spotted

## 2014-03-18 ENCOUNTER — Encounter: Payer: Medicare Other | Admitting: Family Medicine

## 2014-04-08 ENCOUNTER — Encounter: Payer: Medicare Other | Admitting: Family Medicine

## 2014-04-19 ENCOUNTER — Other Ambulatory Visit: Payer: Self-pay | Admitting: Family Medicine

## 2014-05-06 ENCOUNTER — Other Ambulatory Visit: Payer: Self-pay | Admitting: Family Medicine

## 2014-05-18 ENCOUNTER — Ambulatory Visit (INDEPENDENT_AMBULATORY_CARE_PROVIDER_SITE_OTHER): Payer: Medicare Other | Admitting: Podiatry

## 2014-05-18 DIAGNOSIS — M79673 Pain in unspecified foot: Secondary | ICD-10-CM

## 2014-05-18 DIAGNOSIS — B351 Tinea unguium: Secondary | ICD-10-CM

## 2014-05-18 NOTE — Progress Notes (Signed)
Subjective:     Patient ID: Lauren Wood, female   DOB: 11-22-1960, 54 y.o.   MRN: [id number]  HPI patient presents with nail disease 1-5 both feet that are thick and impossible for her to cut with patient being obese and cannot reach nailbed. Also become painful   Review of Systems  All other systems reviewed and are negative.      Objective:   Physical Exam  Nursing note and vitals reviewed. Constitutional: She is oriented to person, place, and time.  Cardiovascular: Intact distal pulses.   Musculoskeletal: Normal range of motion.  Neurological: She is oriented to person, place, and time.  Skin: Skin is warm.   neurovascular status is intact with patient having diminished range of motion subtalar midtarsal joint and diminished muscle strength. Patient's feet do have moderate edema in them and into the ankles but negative Homans sign was noted in the severe thickening incurvation and pain with brittle nailbeds 1-5 both feet     Assessment:     Painful mycotic nails with pain 1-5 both feet    Plan:     Debridement painful nail bed 1-5 both feet with no bleeding noted

## 2014-06-15 ENCOUNTER — Ambulatory Visit (INDEPENDENT_AMBULATORY_CARE_PROVIDER_SITE_OTHER): Payer: Medicare Other | Admitting: Obstetrics and Gynecology

## 2014-06-15 ENCOUNTER — Encounter: Payer: Self-pay | Admitting: Obstetrics and Gynecology

## 2014-06-15 ENCOUNTER — Other Ambulatory Visit (HOSPITAL_COMMUNITY)
Admission: RE | Admit: 2014-06-15 | Discharge: 2014-06-15 | Disposition: A | Discharge status: Home / Self Care | Payer: Medicare Other | Source: Ambulatory Visit | Attending: Family Medicine | Admitting: Family Medicine

## 2014-06-15 VITALS — BP 159/80 | HR 80 | Temp 98.7°F | Ht 64.0 in | Wt 336.0 lb

## 2014-06-15 DIAGNOSIS — Z23 Encounter for immunization: Secondary | ICD-10-CM | POA: Diagnosis not present

## 2014-06-15 DIAGNOSIS — I10 Essential (primary) hypertension: Secondary | ICD-10-CM

## 2014-06-15 DIAGNOSIS — Z124 Encounter for screening for malignant neoplasm of cervix: Secondary | ICD-10-CM | POA: Diagnosis present

## 2014-06-15 DIAGNOSIS — Z1151 Encounter for screening for human papillomavirus (HPV): Secondary | ICD-10-CM | POA: Diagnosis present

## 2014-06-15 DIAGNOSIS — I5030 Unspecified diastolic (congestive) heart failure: Secondary | ICD-10-CM

## 2014-06-15 DIAGNOSIS — N183 Chronic kidney disease, stage 3 unspecified: Secondary | ICD-10-CM

## 2014-06-15 DIAGNOSIS — Z0000 Encounter for general adult medical examination without abnormal findings: Secondary | ICD-10-CM

## 2014-06-15 LAB — LIPID PANEL
Cholesterol: 100 mg/dL (ref 0–200)
HDL: 31 mg/dL — ABNORMAL LOW (ref >39)
LDL Cholesterol: 58 mg/dL (ref 0–99)
Total CHOL/HDL Ratio: 3.2 Ratio
Triglycerides: 54 mg/dL (ref <150)
VLDL: 11 mg/dL (ref 0–40)

## 2014-06-15 LAB — BASIC METABOLIC PANEL
BUN: 10 mg/dL (ref 6–23)
CO2: 30 mEq/L (ref 19–32)
Calcium: 9.4 mg/dL (ref 8.4–10.5)
Chloride: 105 mEq/L (ref 96–112)
Creat: 0.97 mg/dL (ref 0.50–1.10)
Glucose, Bld: 82 mg/dL (ref 70–99)
Potassium: 4.4 mEq/L (ref 3.5–5.3)
Sodium: 143 mEq/L (ref 135–145)

## 2014-06-15 NOTE — Assessment & Plan Note (Signed)
Will get BMET today and access stability.

## 2014-06-15 NOTE — Assessment & Plan Note (Signed)
Up to date today.

## 2014-06-15 NOTE — Assessment & Plan Note (Addendum)
A: No signs of exacerbation today. Stable.  P: Continue Coreg, losartan, and aldactone. BMET and lipid panel ordered. Patient encouraged to continue with her weight loss goals(wt down) and low carb/salt diet.

## 2014-06-15 NOTE — Patient Instructions (Signed)

## 2014-06-15 NOTE — Assessment & Plan Note (Signed)
A: BP elevated today. Compliant with medications. P: Per caregiver patient usually runs normotensive. She believes the elevated pressure today was due to patient being somewhat upset earlier. Last three office visits pressures were controlled. Will observe at this time with no adjustments. Caregiver instructed to start recording some blood pressures. RTC in about 3 months or sooner if needed. Continue losartan, coreg, and aldactone.

## 2014-06-15 NOTE — Progress Notes (Signed)
    Subjective: Chief Complaint  Patient presents with  . Annual Exam  . Flu Vaccine    (Patient with MR, she is able to give basic history but caregiver not in room to help as she had an appointment at same time).  HPI: Lauren Wood is a 54 y.o. presenting to clinic today to discuss the following:  #Hypertension: Blood pressure at home: No one has been checking.  Blood pressure today: 159/80 Taking Meds: Yes Side effects: None ROS: Denies headache, dizziness, visual changes, nausea, vomiting, chest pain, abdominal pain or shortness of breath.  #CHF: Can lay flat to sleep. Denies any edema or SOB. Denies CP. Takes all her medications. Patient is followed by a cardiologist.   #Weight: Patient states that she is trying to l;ose weight. Her and her caregiver are eating healthier and exercising.   Health Maintenance: Due for pap and flu.   All systems were reviewed and were negative unless otherwise noted in the HPI Past Medical, Surgical, Social, and Family History Reviewed & Updated per EMR.   Objective: BP 159/80 mmHg  Pulse 80  Temp(Src) 98.7 F (37.1 C) (Oral)  Ht 5\' 4"  (1.626 m)  Wt 336 lb (152.409 kg)  BMI 57.65 kg/m2  LMP 05/08/2013  Physical Exam  Constitutional: No distress.  Morbidly obese, pleasant  Eyes: EOM are normal. Pupils are equal, round, and reactive to light.  Neck: Normal range of motion. Neck supple. No JVD present. No thyromegaly present.  Cardiovascular: Normal rate, regular rhythm, normal heart sounds and intact distal pulses.   Pulmonary/Chest: Effort normal and breath sounds normal. She has no wheezes. She has no rales.  Abdominal: Soft. Bowel sounds are normal.  Genitourinary: Vagina normal and cervix normal. No vaginal discharge found.  Musculoskeletal: Normal range of motion. She exhibits no edema.  Healed scars on bilateral arms from cutting.   Lymphadenopathy:    She has no cervical adenopathy.  Skin: Skin is warm and dry.     Assessment/Plan: Please see problem based Assessment and Plan  Health Maintainance: flu shot given today and patient given handout for mammogram.   Orders Placed This Encounter  Procedures  . Flu Vaccine QUAD 36+ mos IM  . Lipid Panel  . Basic Metabolic Panel    Luiz Blare, DO 06/15/2014, 10:24 AM PGY-1, [place]

## 2014-06-17 ENCOUNTER — Encounter: Payer: Self-pay | Admitting: Obstetrics and Gynecology

## 2014-06-17 LAB — CYTOLOGY - PAP

## 2014-06-25 ENCOUNTER — Telehealth: Payer: Self-pay | Admitting: Obstetrics and Gynecology

## 2014-06-25 NOTE — Telephone Encounter (Signed)
Moshe Cipro from [facility] called and would like to have a copy of the patient visit on 06/15/14 faxed to them so that they can renew her oxygen. Please fax this to [phone] attention [facility]. jw

## 2014-06-25 NOTE — Telephone Encounter (Signed)
Original closed by mistake please read below. jw

## 2014-06-25 NOTE — Telephone Encounter (Signed)
Notes faxed. Jazmin Hartsell,CMA

## 2014-07-04 ENCOUNTER — Other Ambulatory Visit: Payer: Self-pay | Admitting: Family Medicine

## 2014-08-17 ENCOUNTER — Ambulatory Visit (INDEPENDENT_AMBULATORY_CARE_PROVIDER_SITE_OTHER): Payer: Medicare Other | Admitting: Podiatry

## 2014-08-17 DIAGNOSIS — M79673 Pain in unspecified foot: Secondary | ICD-10-CM

## 2014-08-17 DIAGNOSIS — B351 Tinea unguium: Secondary | ICD-10-CM | POA: Diagnosis not present

## 2014-08-17 NOTE — Progress Notes (Signed)
Subjective:     Patient ID: Lauren Wood, female   DOB: March 04, 1961, 54 y.o.   MRN: [id number]  HPI patient presents with thick yellow nailbeds 1-5 both feet that are painful and she cannot cut herself and she does have diabetes and obesity   Review of Systems     Objective:   Physical Exam Neurovascular status intact muscle strength adequate with incurvated thick yellow nailbeds 1-5 both feet that are painful    Assessment:     Mycotic nail infections with pain 1-5 both feet    Plan:     Debride painful nailbeds 1-5 both feet with no iatrogenic bleeding note

## 2014-08-17 NOTE — Progress Notes (Signed)
Subjective:     Patient ID: Lauren Wood, female   DOB: 04-Oct-1960, 54 y.o.   MRN: [id number]  HPI patient presents with nail disease 1-5 both feet that are thick and impossible for her to cut with patient being obese and cannot reach nailbed. Also become painful   Review of Systems  All other systems reviewed and are negative.      Objective:   Physical Exam  Nursing note and vitals reviewed. Constitutional: She is oriented to person, place, and time.  Cardiovascular: Intact distal pulses.   Musculoskeletal: Normal range of motion.  Neurological: She is oriented to person, place, and time.  Skin: Skin is warm.   neurovascular status is intact with patient having diminished range of motion subtalar midtarsal joint and diminished muscle strength. Patient's feet do have moderate edema in them and into the ankles but negative Homans sign was noted in the severe thickening incurvation and pain with brittle nailbeds 1-5 both feet     Assessment:     Painful mycotic nails with pain 1-5 both feet    Plan:     Debridement painful nail bed 1-5 both feet with no bleeding noted

## 2014-08-22 ENCOUNTER — Other Ambulatory Visit: Payer: Self-pay | Admitting: Family Medicine

## 2014-09-02 ENCOUNTER — Ambulatory Visit: Payer: Medicare Other | Admitting: Obstetrics and Gynecology

## 2014-09-11 ENCOUNTER — Ambulatory Visit: Payer: Medicare Other | Admitting: Obstetrics and Gynecology

## 2014-10-09 ENCOUNTER — Ambulatory Visit: Payer: Medicare Other | Admitting: Obstetrics and Gynecology

## 2014-10-23 ENCOUNTER — Encounter: Payer: Self-pay | Admitting: *Deleted

## 2014-10-23 ENCOUNTER — Other Ambulatory Visit: Payer: Self-pay | Admitting: Family Medicine

## 2014-10-23 MED ORDER — SPIRONOLACTONE 25 MG PO TABS: 25.0000 mg | ORAL_TABLET | Freq: Every day | ORAL | Status: DC

## 2014-10-23 NOTE — Progress Notes (Signed)
Letter mailed to patient to come in for an office visit. Kristyl Athens,CMA

## 2014-11-03 ENCOUNTER — Other Ambulatory Visit: Payer: Self-pay | Admitting: Cardiology

## 2014-11-03 ENCOUNTER — Other Ambulatory Visit: Payer: Self-pay | Admitting: Family Medicine

## 2014-11-03 NOTE — Telephone Encounter (Signed)
Rx(s) sent to pharmacy electronically.  

## 2014-11-04 ENCOUNTER — Other Ambulatory Visit: Payer: Self-pay | Admitting: Obstetrics and Gynecology

## 2014-11-04 MED ORDER — SPIRONOLACTONE 25 MG PO TABS: 25.0000 mg | ORAL_TABLET | Freq: Every day | ORAL | Status: DC

## 2014-11-04 NOTE — Telephone Encounter (Signed)
2nd request.  Martin, Tamika L, RN  

## 2014-11-30 ENCOUNTER — Ambulatory Visit: Payer: Medicare Other

## 2014-12-01 ENCOUNTER — Ambulatory Visit: Payer: Medicare Other | Admitting: Obstetrics and Gynecology

## 2014-12-01 ENCOUNTER — Encounter: Payer: Self-pay | Admitting: Podiatry

## 2014-12-01 ENCOUNTER — Ambulatory Visit (INDEPENDENT_AMBULATORY_CARE_PROVIDER_SITE_OTHER): Payer: Medicare Other | Admitting: Podiatry

## 2014-12-01 VITALS — BP 137/74 | HR 77 | Resp 15

## 2014-12-01 DIAGNOSIS — M79676 Pain in unspecified toe(s): Secondary | ICD-10-CM

## 2014-12-01 DIAGNOSIS — B351 Tinea unguium: Secondary | ICD-10-CM

## 2014-12-01 DIAGNOSIS — M79675 Pain in left toe(s): Secondary | ICD-10-CM

## 2014-12-01 DIAGNOSIS — M79674 Pain in right toe(s): Secondary | ICD-10-CM

## 2014-12-01 NOTE — Progress Notes (Signed)
Patient ID: Lauren Wood, female   DOB: 21-Aug-1960, 54 y.o.   MRN: [id number]  Complaint:  Visit Type: Patient returns to my office for continued preventative foot care services. Complaint: Patient states" my nails have grown long and thick and become painful to walk and wear shoes" .She presents for preventative foot care services. No changes to ROS  Podiatric Exam: Vascular: dorsalis pedis and posterior tibial pulses are palpable bilateral. Capillary return is immediate. Temperature gradient is WNL. Skin turgor WNL  Sensorium: Normal Semmes Weinstein monofilament test. Normal tactile sensation bilaterally. Nail Exam: Pt has thick disfigured discolored nails with subungual debris noted bilateral entire nail hallux through fifth toenails Ulcer Exam: There is no evidence of ulcer or pre-ulcerative changes or infection. Orthopedic Exam: Muscle tone and strength are WNL. No limitations in general ROM. No crepitus or effusions noted. Foot type and digits show no abnormalities. Bony prominences are unremarkable. Asymptomatic HAV B/L Skin: No Porokeratosis. No infection or ulcers  Diagnosis:  Tinea unguium, Pain in right toe, pain in left toes  Treatment & Plan Procedures and Treatment: Consent by patient was obtained for treatment procedures. The patient understood the discussion of treatment and procedures well. All questions were answered thoroughly reviewed. Debridement of mycotic and hypertrophic toenails, 1 through 5 bilateral and clearing of subungual debris. No ulceration, no infection noted.  Return Visit-Office Procedure: Patient instructed to return to the office for a follow up visit 3 months for continued evaluation and treatment.

## 2014-12-14 ENCOUNTER — Ambulatory Visit: Payer: Medicare Other | Admitting: Obstetrics and Gynecology

## 2014-12-22 ENCOUNTER — Telehealth: Payer: Self-pay | Admitting: Obstetrics and Gynecology

## 2014-12-22 NOTE — Telephone Encounter (Signed)
Patient is scheduled on 12-24-14 for an appt. Jazmin Hartsell,CMA

## 2014-12-22 NOTE — Telephone Encounter (Signed)
I stated on my reply fax that patient needs an appointment. She has been scheduled in clinic x2 and unfortunately has not showed up. She had no prior diagnosis of incontinence in her chart so this appears to be a new problem to me and so will need a clinic evaluation before I prescribe the supplies. Will also call Claudia to inform.

## 2014-12-22 NOTE — Telephone Encounter (Signed)
Will forward to MD to check on supplies status. Marylene Masek,CMA

## 2014-12-22 NOTE — Telephone Encounter (Signed)
Claudia with [facility] calls, checking the status if order faxed on 12/07/14 (originally faxed in March) for patient's incontinence supplies. Please call Rosemarie Ax at her direct line [phone] and let her know if this has been taken care of or this needs to re-faxed for the 3rd time.

## 2014-12-24 ENCOUNTER — Other Ambulatory Visit: Payer: Self-pay | Admitting: Obstetrics and Gynecology

## 2014-12-24 ENCOUNTER — Encounter: Payer: Self-pay | Admitting: Obstetrics and Gynecology

## 2014-12-24 ENCOUNTER — Ambulatory Visit (INDEPENDENT_AMBULATORY_CARE_PROVIDER_SITE_OTHER): Payer: Medicare Other | Admitting: Obstetrics and Gynecology

## 2014-12-24 VITALS — BP 156/88 | HR 69 | Temp 98.5°F | Ht 64.0 in | Wt 340.2 lb

## 2014-12-24 DIAGNOSIS — R32 Unspecified urinary incontinence: Secondary | ICD-10-CM | POA: Insufficient documentation

## 2014-12-24 DIAGNOSIS — N3941 Urge incontinence: Secondary | ICD-10-CM | POA: Diagnosis not present

## 2014-12-24 DIAGNOSIS — I10 Essential (primary) hypertension: Secondary | ICD-10-CM

## 2014-12-24 MED ORDER — OMEPRAZOLE 20 MG PO CPDR: DELAYED_RELEASE_CAPSULE | ORAL | Status: DC

## 2014-12-24 MED ORDER — FERROUS SULFATE 220 (44 FE) MG/5ML PO ELIX: ORAL_SOLUTION | ORAL | Status: DC

## 2014-12-24 MED ORDER — ASPIRIN 81 MG PO TBEC: 81.0000 mg | DELAYED_RELEASE_TABLET | Freq: Every day | ORAL | Status: DC

## 2014-12-24 MED ORDER — SPIRONOLACTONE 25 MG PO TABS: 25.0000 mg | ORAL_TABLET | Freq: Every day | ORAL | Status: DC

## 2014-12-24 MED ORDER — LOSARTAN POTASSIUM 50 MG PO TABS: 50.0000 mg | ORAL_TABLET | Freq: Two times a day (BID) | ORAL | Status: DC

## 2014-12-24 MED ORDER — CARVEDILOL 25 MG PO TABS: ORAL_TABLET | ORAL | Status: DC

## 2014-12-24 NOTE — Assessment & Plan Note (Signed)
A: Symptoms consistent with urge incontinence but most likely has functional incontinence aspects as patient has cognitive impairment. P: Form signed to get patient incontinence supplies

## 2014-12-24 NOTE — Assessment & Plan Note (Signed)
A: Elevated blood pressure today on two separate readings. Goal would be less than 140/80. Compliant with medications given to her by caregiver. Her caregiver states blood pressures usually run normal.  P:  -Increased losartan to 50 mg twice a day -Refilled other blood pressure medication -Continue to record blood pressure medications and monitor -RTC in 1 month for follow-up

## 2014-12-24 NOTE — Patient Instructions (Signed)
Urinary Incontinence Urinary incontinence is the involuntary loss of urine from your bladder. CAUSES  There are many causes of urinary incontinence. They include:  Medicines.  Infections.  Prostatic enlargement, leading to overflow of urine from your bladder.  Surgery.  Neurological diseases.  Emotional factors. SIGNS AND SYMPTOMS Urinary Incontinence can be divided into four types: 1. Urge incontinence. Urge incontinence is the involuntary loss of urine before you have the opportunity to go to the bathroom. There is a sudden urge to void but not enough time to reach a bathroom. 2. Stress incontinence. Stress incontinence is the sudden loss of urine with any activity that forces urine to pass. It is commonly caused by anatomical changes to the pelvis and sphincter areas of your body. 3. Overflow incontinence. Overflow incontinence is the loss of urine from an obstructed opening to your bladder. This results in a backup of urine and a resultant buildup of pressure within the bladder. When the pressure within the bladder exceeds the closing pressure of the sphincter, the urine overflows, which causes incontinence, similar to water overflowing a dam. 4. Total incontinence. Total incontinence is the loss of urine as a result of the inability to store urine within your bladder. DIAGNOSIS  Evaluating the cause of incontinence may require:  A thorough and complete medical and obstetric history.  A complete physical exam.  Laboratory tests such as a urine culture and sensitivities. When additional tests are indicated, they can include:  An ultrasound exam.  Kidney and bladder X-rays.  Cystoscopy. This is an exam of the bladder using a narrow scope.  Urodynamic testing to test the nerve function to the bladder and sphincter areas. TREATMENT  Treatment for urinary incontinence depends on the cause:  For urge incontinence caused by a bacterial infection, antibiotics will be prescribed.  If the urge incontinence is related to medicines you take, your health care provider may have you change the medicine.  For stress incontinence, surgery to re-establish anatomical support to the bladder or sphincter, or both, will often correct the condition.  For overflow incontinence caused by an enlarged prostate, an operation to open the channel through the enlarged prostate will allow the flow of urine out of the bladder. In women with fibroids, a hysterectomy may be recommended.  For total incontinence, surgery on your urinary sphincter may help. An artificial urinary sphincter (an inflatable cuff placed around the urethra) may be required. In women who have developed a hole-like passage between their bladder and vagina (vesicovaginal fistula), surgery to close the fistula often is required. HOME CARE INSTRUCTIONS  Normal daily hygiene and the use of pads or adult diapers that are changed regularly will help prevent odors and skin damage.  Avoid caffeine. It can overstimulate your bladder.  Use the bathroom regularly. Try about every 2-3 hours to go to the bathroom, even if you do not feel the need to do so. Take time to empty your bladder completely. After urinating, wait a minute. Then try to urinate again.  For causes involving nerve dysfunction, keep a log of the medicines you take and a journal of the times you go to the bathroom. SEEK MEDICAL CARE IF:  You experience worsening of pain instead of improvement in pain after your procedure.  Your incontinence becomes worse instead of better. SEE IMMEDIATE MEDICAL CARE IF:  You experience fever or shaking chills.  You are unable to pass your urine.  You have redness spreading into your groin or down into your thighs. MAKE SURE   YOU:   Understand these instructions.   Will watch your condition.  Will get help right away if you are not doing well or get worse. Document Released: 06/08/2004 Document Revised: 02/19/2013 Document  Reviewed: 10/08/2012 Exit[employer] Patient Information 2015 [employer]. This information is not intended to replace advice given to you by your health care provider. Make sure you discuss any questions you have with your health care provider.  

## 2014-12-24 NOTE — Progress Notes (Signed)
    Subjective: Chief Complaint  Patient presents with  . Follow-up    72mth fu    HPI: Lauren Wood is a 54 y.o. presenting to clinic today to discuss the following:  #Incontinenece: -Request made for incontinence supplies i.e. depends -Caregiver states that patient wets her pants sometimes  -patient able to go to the bathroom herself but sometimes does not make it in time  -Patient states that sometimes she really has to go  -She is urged to go and cannot hold it  -patient states she has a lot of urine when she does go  -no pain with urination  -no blood  #Hypertension Blood pressure at home: According to caregiver is usually normal  Blood pressure today:  elevated on 2 separate occasions  Taking Meds:  yes Side effects:  none  ROS: Denies headache, dizziness, visual changes, nausea, vomiting, chest pain, abdominal pain or shortness of breath.   -Caregiver requesting zoster vaccine and medication refills for patient.   ROS in HPI.  Past Medical, Surgical, Social, and Family History Reviewed & Updated per EMR.   Objective: BP 156/88 mmHg  Pulse 69  Temp(Src) 98.5 F (36.9 C) (Oral)  Ht 5\' 4"  (1.626 m)  Wt 340 lb 4 oz (154.336 kg)  BMI 58.37 kg/m2  LMP 05/08/2013  Physical Exam  Constitutional: She is oriented to person, place, and time and well-developed, well-nourished, and in no distress.  Cardiovascular: Normal rate, regular rhythm and normal heart sounds.   Pulmonary/Chest: Effort normal and breath sounds normal.  Abdominal: Soft. Bowel sounds are normal. There is no tenderness.  Musculoskeletal: Normal range of motion. She exhibits no edema.  Neurological: She is alert and oriented to person, place, and time. Gait abnormal.  Walks with cane, cognitive impairment, no focal deficits appreciated  Skin: Skin is warm and dry.   Assessment/Plan: Please see problem based Assessment and Plan   Meds ordered this encounter  Medications  . spironolactone  (ALDACTONE) 25 MG tablet    Sig: Take 1 tablet (25 mg total) by mouth daily.    Dispense:  60 tablet    Refill:  1  . carvedilol (COREG) 25 MG tablet    Sig: TAKE ONE TABLET TWICE DAILY WITH A MEAL    Dispense:  60 tablet    Refill:  3  . ferrous sulfate 220 (44 FE) MG/5ML solution    Sig: TAKE 6.31ml DAILY    Dispense:  240 mL    Refill:  0  . losartan (COZAAR) 50 MG tablet    Sig: Take 1 tablet (50 mg total) by mouth 2 (two) times daily.    Dispense:  30 tablet    Refill:  1  . omeprazole (PRILOSEC) 20 MG capsule    Sig: TAKE TWO CAPSULES EVERY DAY    Dispense:  90 capsule    Refill:  3  . aspirin (QC LO-DOSE ASPIRIN) 81 MG EC tablet    Sig: Take 1 tablet (81 mg total) by mouth daily. Swallow whole.    Dispense:  30 tablet    Refill:  [address] DO 12/24/2014, 3:23 PM PGY-2, [place]

## 2014-12-25 ENCOUNTER — Telehealth: Payer: Self-pay | Admitting: *Deleted

## 2014-12-25 NOTE — Telephone Encounter (Signed)
Caregiver asked after appt yesterday if pt could get Shingles shot and that she could come back to pu if possible for pt. Asked Dr. Gerarda Fraction and she stated that she is 54yrs old and she has to wait until she turns 54yrs old in order to get vaccine as this is the recommended age for vaccine. Will contact caregiver and advise her.  Omayra Tulloch, CMA.

## 2014-12-28 NOTE — Telephone Encounter (Signed)
Advised pt's caregiver, Skiler Tye, about Shingles vaccine and when they recommended vaccine to be given. She stated that her brother(34yrs old) had shingles about 2 weeks ago and wanted to make sure pt did not get this as well. Shontae Rosiles, CMA.

## 2014-12-30 NOTE — Telephone Encounter (Signed)
Can order shingles vaccine early for patient but unsure if insurance will cover it. Patient's caregiver will need to look into this further. I called pharmacy on file and they do not administer shingles vaccine. If caregiver wants to proceed she needs to give [place] a pharmacy she would like to have patient go to and I will order vaccine to pharmacy.

## 2014-12-31 NOTE — Telephone Encounter (Signed)
Spoke to pharmacy and they said patient does not need a Rx to get shingles vaccine if she is over 50. She just needs to come into the pharmacy and they will run it by her insurance and if the insurance company covers it they will administer it there. If patient does end up getting vaccine please have sister let us know so we can put it in our records. Thanks!

## 2014-12-31 NOTE — Telephone Encounter (Signed)
Spoke with sister [name] and she states that [facility] on [address] carries it.  Panda Crossin,CMA

## 2014-12-31 NOTE — Telephone Encounter (Signed)
Spoke with sister and she is aware of this. Elba Dendinger,CMA

## 2015-02-08 ENCOUNTER — Other Ambulatory Visit: Payer: Self-pay | Admitting: Obstetrics and Gynecology

## 2015-02-23 ENCOUNTER — Telehealth: Payer: Self-pay | Admitting: Obstetrics and Gynecology

## 2015-02-23 DIAGNOSIS — H9209 Otalgia, unspecified ear: Secondary | ICD-10-CM

## 2015-02-23 DIAGNOSIS — H9190 Unspecified hearing loss, unspecified ear: Secondary | ICD-10-CM

## 2015-02-23 NOTE — Telephone Encounter (Signed)
Needs referrals to ear nose and throat dr.  Dr Redmond Baseman at [facility]

## 2015-02-23 NOTE — Telephone Encounter (Signed)
Spoke with sister Lauren Wood and patient needs to be seen for excessive wax build up and ear infection.  She also needs to a new referral for Dr. Irene Shipper with audiology for her hearing aid, but they can't help her with this until she gets her ears checked. Jazmin Hartsell,CMA

## 2015-02-24 NOTE — Telephone Encounter (Signed)
Referrals placed 

## 2015-03-03 ENCOUNTER — Ambulatory Visit: Payer: Medicare Other | Admitting: Podiatry

## 2015-03-16 ENCOUNTER — Encounter: Payer: Self-pay | Admitting: Obstetrics and Gynecology

## 2015-03-16 ENCOUNTER — Ambulatory Visit (INDEPENDENT_AMBULATORY_CARE_PROVIDER_SITE_OTHER): Payer: Medicare Other | Admitting: Obstetrics and Gynecology

## 2015-03-16 VITALS — BP 134/78 | HR 79 | Temp 98.4°F | Ht 64.0 in | Wt 341.1 lb

## 2015-03-16 DIAGNOSIS — Z23 Encounter for immunization: Secondary | ICD-10-CM

## 2015-03-16 DIAGNOSIS — Z86010 Personal history of colonic polyps: Secondary | ICD-10-CM | POA: Diagnosis not present

## 2015-03-16 DIAGNOSIS — I10 Essential (primary) hypertension: Secondary | ICD-10-CM | POA: Diagnosis present

## 2015-03-16 DIAGNOSIS — G4733 Obstructive sleep apnea (adult) (pediatric): Secondary | ICD-10-CM | POA: Diagnosis not present

## 2015-03-16 NOTE — Progress Notes (Signed)
     Subjective: Chief Complaint  Patient presents with  . Follow-up    HPI: Lauren Wood is a 54 y.o. presenting to clinic today to discuss the following:  #Hypertension: Blood pressure at home: Does not check Blood pressure today: Elevated initially but improved on recheck Taking Meds: Yes, administered by sister (caretaker) Side effects: None ROS: Denies headache, dizziness, visual changes, nausea, vomiting, chest pain, abdominal pain or shortness of breath.  #OSA: -patient continues to use CPAP machine -no complaints -no daytime fatigue -does sleep for >10 hours a night -machine not working properly needs adjustments  #Colon polyp: -colonscopy done in 06/2013 with evidence of tubular adenoma -patient was supposed to follow-up repeat colonoscopy that summer but does not look like there was follow-up -no rectal bleeding -father passed from colon cancer  Denies any pain today No recent falls  Health Maintenance: Due for flu vaccine   ROS in HPI.  Past Medical, Surgical, Social, and Family History Reviewed & Updated per EMR.  Objective: BP 155/95 mmHg  Pulse 79  Temp(Src) 98.4 F (36.9 C) (Oral)  Ht 5\' 4"  (1.626 m)  Wt 341 lb 2 oz (154.733 kg)  BMI 58.53 kg/m2  LMP 05/08/2013  Physical Exam  Constitutional: She is well-developed, well-nourished, and in no distress.  HENT:  Hearing aids in place  Cardiovascular: Normal rate, regular rhythm and intact distal pulses.   Pulmonary/Chest: Effort normal and breath sounds normal.  Abdominal: Soft. There is no tenderness.  Musculoskeletal: She exhibits no edema.  Neurological: She is alert.  Skin: Skin is warm and dry.   Assessment/Plan: Please see problem based Assessment and Plan  Health Maintainance: Flu vaccine given today   Orders Placed This Encounter  Procedures  . For home use only DME continuous positive airway pressure (CPAP)    Diagnosed with OSA ~7 years ago. Needs possible new CPAP machine vs  new setting. Not working properly per patient family.    Order Specific Question:  Patient has OSA or probable OSA    Answer:  Yes    Order Specific Question:  Settings    Answer:  Other see comments    Order Specific Question:  CPAP supplies needed    Answer:  Mask, headgear, cushions, filters, heated tubing and water chamber  . Flu Vaccine QUAD 36+ mos IM  . Ambulatory referral to Gastroenterology    Referral Priority:  Routine    Referral Type:  Consultation    Referral Reason:  Specialty Services Required    Number of Visits Requested:  [place], DO 03/16/2015, 2:35 PM PGY-2, [place]

## 2015-03-16 NOTE — Patient Instructions (Addendum)
I am pleased to hear that things are going well for you.  Here are some of the things we discussed today: -Blood pressure was initially elevated but on recheck was in normal range. No adjustments to medications at this time -Please follow-up with GI doctor for repeat colonoscopy. -Received your flu vaccine today -will look into getting CPAP machine adjusted again.   Please schedule a follow-up appointment for Feb for your next wellness/physcial exam. Come in sooner if needed.   Thanks for allowing me to be a part of your care! Dr. Gerarda Fraction

## 2015-03-17 NOTE — Assessment & Plan Note (Signed)
A: BP initially elevated but returned to normal range on recheck. Compliant with medications.  P: Continue to monitor no adjustments at this time.

## 2015-03-17 NOTE — Assessment & Plan Note (Signed)
A: Last sleep study in 2014 with severe OSA. Needs adjustment of CPAP machine as family states not working as well anymore.  P: DME order placed for evaluation of CPAP machine. May need another sleep study with titrations.

## 2015-03-17 NOTE — Assessment & Plan Note (Addendum)
A: Patient with history of colon polyps; tubular adenomas. Was supposed to have close follow-up with GI but has not been seen. Family history (father) of colon cancer. No rectal bleeding.  P: Referral for GI follow-up. Will likely need another colonoscopy.

## 2015-03-31 ENCOUNTER — Encounter: Payer: Self-pay | Admitting: Podiatry

## 2015-03-31 ENCOUNTER — Ambulatory Visit (INDEPENDENT_AMBULATORY_CARE_PROVIDER_SITE_OTHER): Payer: Medicare Other | Admitting: Podiatry

## 2015-03-31 DIAGNOSIS — B351 Tinea unguium: Secondary | ICD-10-CM | POA: Diagnosis not present

## 2015-03-31 DIAGNOSIS — M79675 Pain in left toe(s): Secondary | ICD-10-CM

## 2015-03-31 DIAGNOSIS — M79674 Pain in right toe(s): Secondary | ICD-10-CM | POA: Diagnosis not present

## 2015-03-31 NOTE — Progress Notes (Signed)
Patient ID: Lauren Wood, female   DOB: 09-22-1960, 54 y.o.   MRN: [id number]  Subjective: This patient presents for scheduled visit complaining of painful toenails walking wearing shoes requesting nail debridement  Objective: Patient sister present to treatment room The toenails are elongated, incurvated, discolored, hypertrophic and tender to direct palpation 6-10  Assessment: Symptomatic onychomycoses 6-10  Plan: Debridement toenails 10 mechanically and electrically without any bleeding  Reappoint 3 months

## 2015-04-05 ENCOUNTER — Other Ambulatory Visit: Payer: Self-pay | Admitting: Obstetrics and Gynecology

## 2015-04-21 ENCOUNTER — Telehealth: Payer: Self-pay

## 2015-04-21 NOTE — Telephone Encounter (Signed)
Patient with multiple medical problems and on O2.  Needs office visit, not direct schedule.

## 2015-04-21 NOTE — Telephone Encounter (Signed)
Sheri,  BMI 58.53 this month.  Please schedule at hospital.  Pt is last PV tomorrow so if it's more than 60 days out, please let us know and we will reschedule PV as well.  Thanks for all you do, Angela/PV

## 2015-04-22 ENCOUNTER — Telehealth: Payer: Self-pay

## 2015-04-22 NOTE — Telephone Encounter (Signed)
[facility] advising pt to make an OV prior to having a colonoscopy at [facility]

## 2015-04-23 NOTE — Telephone Encounter (Signed)
Received: 2 days ago    Gatha Mayer, MD  Grant Fontana, RN            Direct is ok       Previous Messages     ----- Message -----   From: Grant Fontana, RN   Sent: 04/21/2015 12:53 PM    To: Gatha Mayer, MD   BMI 58.53. Sheri working on [facility] appt. Let one of [place] know if you'd rather have an OV please. Thank you, Tacuma Graffam/PV

## 2015-04-23 NOTE — Telephone Encounter (Signed)
Discussed again with Dr. Carlean Purl.  Patient should have office visit due to her co-morbidities .  Levada Dy notified

## 2015-05-05 ENCOUNTER — Other Ambulatory Visit: Payer: Self-pay | Admitting: Obstetrics and Gynecology

## 2015-05-06 ENCOUNTER — Encounter: Payer: Medicare Other | Admitting: Internal Medicine

## 2015-06-02 ENCOUNTER — Other Ambulatory Visit: Payer: Self-pay | Admitting: Obstetrics and Gynecology

## 2015-07-07 ENCOUNTER — Ambulatory Visit: Payer: Medicare Other | Admitting: Podiatry

## 2015-07-07 DIAGNOSIS — H6123 Impacted cerumen, bilateral: Secondary | ICD-10-CM | POA: Insufficient documentation

## 2015-07-07 DIAGNOSIS — H90A31 Mixed conductive and sensorineural hearing loss, unilateral, right ear with restricted hearing on the contralateral side: Secondary | ICD-10-CM | POA: Insufficient documentation

## 2015-07-07 DIAGNOSIS — H7291 Unspecified perforation of tympanic membrane, right ear: Secondary | ICD-10-CM | POA: Insufficient documentation

## 2015-07-24 ENCOUNTER — Other Ambulatory Visit: Payer: Self-pay | Admitting: Obstetrics and Gynecology

## 2015-07-28 ENCOUNTER — Ambulatory Visit (INDEPENDENT_AMBULATORY_CARE_PROVIDER_SITE_OTHER): Payer: Medicare Other | Admitting: Podiatry

## 2015-07-28 DIAGNOSIS — M79675 Pain in left toe(s): Secondary | ICD-10-CM | POA: Diagnosis not present

## 2015-07-28 DIAGNOSIS — M79674 Pain in right toe(s): Secondary | ICD-10-CM | POA: Diagnosis not present

## 2015-07-28 DIAGNOSIS — B351 Tinea unguium: Secondary | ICD-10-CM

## 2015-07-28 NOTE — Progress Notes (Signed)
Patient ID: Lauren Wood, female   DOB: 1961-03-17, 55 y.o.   MRN: [id number]   Subjective: This patient presents for scheduled visit complaining of painful toenails walking wearing shoes requesting nail debridement  Objective: No open skin lesions bilaterally The toenails are elongated, incurvated, discolored, hypertrophic and tender to direct palpation 6-10  Assessment: Symptomatic onychomycoses 6-10  Plan: Debridement toenails 10 mechanically and electrically without any bleeding  Reappoint 3 months

## 2015-09-09 ENCOUNTER — Encounter: Payer: Medicare Other | Admitting: Internal Medicine

## 2015-09-10 ENCOUNTER — Ambulatory Visit (INDEPENDENT_AMBULATORY_CARE_PROVIDER_SITE_OTHER): Payer: Medicare Other | Admitting: Internal Medicine

## 2015-09-10 ENCOUNTER — Encounter: Payer: Self-pay | Admitting: Internal Medicine

## 2015-09-10 VITALS — BP 138/88 | HR 80 | Ht 64.0 in | Wt 336.0 lb

## 2015-09-10 DIAGNOSIS — D123 Benign neoplasm of transverse colon: Secondary | ICD-10-CM | POA: Diagnosis not present

## 2015-09-10 NOTE — Patient Instructions (Addendum)
You have been scheduled for a colonoscopy. Please follow written instructions given to you at your visit today.  Please pick up your prep supplies at the pharmacy. If you use inhalers (even only as needed), please bring them with you on the day of your procedure. Your physician has requested that you go to www.startemmi.com and enter the access code given to you at your visit today. This web site gives a general overview about your procedure. However, you should still follow specific instructions given to you by our office regarding your preparation for the procedure.  If you are age 72 or older, your body mass index should be between 23-30. Your Body mass index is 57.65 kg/(m^2). If this is out of the aforementioned range listed, please consider follow up with your Primary Care Provider.  If you are age 31 or younger, your body mass index should be between 19-25. Your Body mass index is 57.65 kg/(m^2). If this is out of the aformentioned range listed, please consider follow up with your Primary Care Provider.   I appreciate the opportunity to care for you.

## 2015-09-13 NOTE — Progress Notes (Signed)
Subjective:    Patient ID: Lauren Wood, female    DOB: 1961-04-03, 55 y.o.   MRN: [id number] CC: colon polyp hx HPI  Nice middle-aged African [place] woman here for f/u - colonoscopy 2015 20 mm adenoma and closer f/u of polypectomy recommended. No active GI sxs reportyed.. She has mental retardation and borderline personality disorder and lives w/ her sister. Also obes and on CPAP. Allergies  Allergen Reactions  . Enalapril Maleate     REACTION: unknown reaction  . Hctz [Hydrochlorothiazide]     Hyperuricemia with Lasix   . Lasix [Furosemide] Other (See Comments)    Hyperuricemia with HCTZ  Hyperuricemia with HCTZ    Outpatient Prescriptions Prior to Visit  Medication Sig Dispense Refill  . aspirin (QC LO-DOSE ASPIRIN) 81 MG EC tablet Take 1 tablet (81 mg total) by mouth daily. Swallow whole. 30 tablet 11  . benztropine (COGENTIN) 0.5 MG tablet Take 0.25 mg by mouth 2 (two) times daily.    Marland Kitchen buPROPion (WELLBUTRIN XL) 150 MG 24 hr tablet Take 150 mg by mouth every morning.    . carvedilol (COREG) 25 MG tablet TAKE ONE TABLET TWICE DAILY WITH A MEAL 60 tablet 4  . CIPRODEX otic suspension Place 4 drops into both ears 2 (two) times daily.     . clonazePAM (KLONOPIN) 0.5 MG tablet Take 0.25 mg by mouth 2 (two) times daily as needed for anxiety (sleep).     . docusate sodium (DOK) 100 MG capsule Take 1 capsule (100 mg total) by mouth 2 (two) times daily as needed for mild constipation or moderate constipation. 60 capsule 1  . FLUoxetine (PROZAC) 40 MG capsule Take 40 mg by mouth every morning.    Marland Kitchen losartan (COZAAR) 50 MG tablet TAKE ONE TABLET TWICE DAILY 60 tablet 6  . mirtazapine (REMERON) 30 MG tablet Take 30 mg by mouth at bedtime.     Marland Kitchen omeprazole (PRILOSEC) 20 MG capsule TAKE TWO CAPSULES EVERY DAY 90 capsule 2  . prazosin (MINIPRESS) 1 MG capsule Take 1 mg by mouth daily.     . risperiDONE (RISPERDAL) 3 MG tablet Take 3 mg by mouth 2 (two) times daily.     Marland Kitchen spironolactone  (ALDACTONE) 25 MG tablet TAKE ONE TABLET EACH DAY 60 tablet 1  . traMADol (ULTRAM) 50 MG tablet Take 50 mg by mouth every 6 (six) hours as needed for moderate pain.    . traZODone (DESYREL) 100 MG tablet Take 100 mg by mouth at bedtime.     . ferrous sulfate 220 (44 FE) MG/5ML solution TAKE 6.110ml DAILY 240 mL 0  . Humidifier/Vaporizer Supplies MISC 1 each by Does not apply route daily. 1 each 0   No facility-administered medications prior to visit.   Past Medical History  Diagnosis Date  . ALLERGIC RHINITIS 08/02/2009  . PRESSURE ULCER OTHER SITE 10/01/2009    hx of  . Boil of vulva 11/07/2012  . HTN (hypertension)   . Hip pain 12/03/2012  . Arthritis   . Obesity   . Borderline personality disorder 05/31/2011  . Unspecified psychosis 12/20/2009  . Mental retardation     child like level  . Sleep apnea     uses cpap, setting of 2  . Personal history of colonic polyps 06/24/2013    06/24/2013 2 cm hepatic flexure polyp and 8 mm descending polyp 06/25/2013 path report of polys --> tubular adenoma w/o high grade dysplasia     . Gout of foot 09/26/2013  L foot 09/2013. In the setting of HCTZ 25 mg and lasix 40 mg daily. Stopped HCTZ after decreasing to 12.5 mg. Treated with colchicine.    Marland Kitchen Heart failure (HCC)     chronic  . Sleep apnea   . Kidney disease     chronic  . Heart murmur    Past Surgical History  Procedure Laterality Date  . Tympanostomy tube placement Bilateral yrs ago  . Cyst removed  2011    forehead  . Colonoscopy N/A 06/24/2013    Procedure: COLONOSCOPY;  Surgeon: Gatha Mayer, MD;  Location: [facility];  Service: Endoscopy;  Laterality: N/A;   Social History   Social History  . Marital Status: Single    Spouse Name: N/A  . Number of Children: 0  . Years of Education: N/A   Social History Main Topics  . Smoking status: Never Smoker   . Smokeless tobacco: Never Used  . Alcohol Use: No  . Drug Use: No  . Sexual Activity: No   Other Topics Concern  . None     Social History Narrative   Goes to adult daycare.  Lives with sister.           Family History  Problem Relation Age of Onset  . Colon cancer Father     around age 66  . Diabetes Mother   . Esophageal cancer Neg Hx   . Pancreatic cancer Neg Hx   . Stomach cancer Neg Hx   . Liver disease Neg Hx    Review of Systems As above    Objective:   Physical Exam @BP  138/88 mmHg  Pulse 80  Ht 5\' 4"  (1.626 m)  Wt 336 lb (152.409 kg)  BMI 57.65 kg/m2  LMP 05/08/2013@  General:  NAD, obese Eyes:   anicteric Lungs:  clear Heart:: S1S2 no rubs, murmurs or gallops Abdomen:  soft and nontender, BS+ Ext:   no edema, cyanosis or clubbing  Data Reviewed:  As per HPI    Assessment & Plan:   Encounter Diagnosis  Name Primary?  . Benign neoplasm of transverse colon Yes    Needs close f/u colonoscopy to make sure polyp completely removed - she is overdue for this - will oproceed  The risks and benefits as well as alternatives of endoscopic procedure(s) have been discussed and reviewed. All questions answered. The patient and her sister agree to proceed.  Will schedule at hospital due to BMI.   15 minutes time spent with patient > half in counseling coordination of care

## 2015-09-22 ENCOUNTER — Telehealth: Payer: Self-pay | Admitting: Internal Medicine

## 2015-09-22 NOTE — Telephone Encounter (Signed)
Spoke with Lauren Wood , she has misplaced the prep instructions.  I will mail new copy out to her.

## 2015-09-27 ENCOUNTER — Encounter: Payer: Medicare Other | Admitting: Internal Medicine

## 2015-09-28 ENCOUNTER — Encounter (HOSPITAL_COMMUNITY): Payer: Self-pay | Admitting: *Deleted

## 2015-10-02 ENCOUNTER — Other Ambulatory Visit: Payer: Self-pay | Admitting: Obstetrics and Gynecology

## 2015-10-05 ENCOUNTER — Ambulatory Visit (HOSPITAL_COMMUNITY): Payer: Medicare Other | Admitting: Anesthesiology

## 2015-10-05 ENCOUNTER — Encounter (HOSPITAL_COMMUNITY)
Admission: RE | Disposition: A | Discharge status: Home / Self Care | Payer: Self-pay | Source: Ambulatory Visit | Attending: Internal Medicine

## 2015-10-05 ENCOUNTER — Encounter (HOSPITAL_COMMUNITY): Payer: Self-pay | Admitting: Anesthesiology

## 2015-10-05 ENCOUNTER — Ambulatory Visit (HOSPITAL_COMMUNITY)
Admission: RE | Admit: 2015-10-05 | Discharge: 2015-10-05 | Disposition: A | Discharge status: Home / Self Care | Payer: Medicare Other | Source: Ambulatory Visit | Attending: Internal Medicine | Admitting: Internal Medicine

## 2015-10-05 DIAGNOSIS — G4730 Sleep apnea, unspecified: Secondary | ICD-10-CM | POA: Diagnosis not present

## 2015-10-05 DIAGNOSIS — M1990 Unspecified osteoarthritis, unspecified site: Secondary | ICD-10-CM | POA: Diagnosis not present

## 2015-10-05 DIAGNOSIS — E669 Obesity, unspecified: Secondary | ICD-10-CM | POA: Insufficient documentation

## 2015-10-05 DIAGNOSIS — N189 Chronic kidney disease, unspecified: Secondary | ICD-10-CM | POA: Diagnosis not present

## 2015-10-05 DIAGNOSIS — D123 Benign neoplasm of transverse colon: Secondary | ICD-10-CM | POA: Diagnosis not present

## 2015-10-05 DIAGNOSIS — K219 Gastro-esophageal reflux disease without esophagitis: Secondary | ICD-10-CM | POA: Diagnosis not present

## 2015-10-05 DIAGNOSIS — Z7982 Long term (current) use of aspirin: Secondary | ICD-10-CM | POA: Diagnosis not present

## 2015-10-05 DIAGNOSIS — I509 Heart failure, unspecified: Secondary | ICD-10-CM | POA: Diagnosis not present

## 2015-10-05 DIAGNOSIS — Z1211 Encounter for screening for malignant neoplasm of colon: Secondary | ICD-10-CM

## 2015-10-05 DIAGNOSIS — Z79899 Other long term (current) drug therapy: Secondary | ICD-10-CM | POA: Insufficient documentation

## 2015-10-05 DIAGNOSIS — Z6841 Body Mass Index (BMI) 40.0 and over, adult: Secondary | ICD-10-CM | POA: Insufficient documentation

## 2015-10-05 DIAGNOSIS — Z9989 Dependence on other enabling machines and devices: Secondary | ICD-10-CM | POA: Insufficient documentation

## 2015-10-05 DIAGNOSIS — I130 Hypertensive heart and chronic kidney disease with heart failure and stage 1 through stage 4 chronic kidney disease, or unspecified chronic kidney disease: Secondary | ICD-10-CM | POA: Diagnosis not present

## 2015-10-05 DIAGNOSIS — F79 Unspecified intellectual disabilities: Secondary | ICD-10-CM | POA: Insufficient documentation

## 2015-10-05 DIAGNOSIS — Z86010 Personal history of colonic polyps: Secondary | ICD-10-CM | POA: Insufficient documentation

## 2015-10-05 DIAGNOSIS — K5730 Diverticulosis of large intestine without perforation or abscess without bleeding: Secondary | ICD-10-CM | POA: Insufficient documentation

## 2015-10-05 HISTORY — PX: COLONOSCOPY WITH PROPOFOL: SHX5780

## 2015-10-05 SURGERY — COLONOSCOPY WITH PROPOFOL
Anesthesia: Monitor Anesthesia Care

## 2015-10-05 MED ORDER — SODIUM CHLORIDE 0.9 % IV SOLN: INTRAVENOUS | Status: DC

## 2015-10-05 MED ORDER — LIDOCAINE HCL (CARDIAC) 20 MG/ML IV SOLN
INTRAVENOUS | Status: DC | PRN
  Administered 2015-10-05: 50 mg via INTRAVENOUS

## 2015-10-05 MED ORDER — LIDOCAINE HCL (CARDIAC) 20 MG/ML IV SOLN
INTRAVENOUS | Status: AC
  Filled 2015-10-05: qty 5

## 2015-10-05 MED ORDER — LACTATED RINGERS IV SOLN
INTRAVENOUS | Status: DC
  Administered 2015-10-05: 1000 mL via INTRAVENOUS

## 2015-10-05 MED ORDER — PROPOFOL 10 MG/ML IV BOLUS
INTRAVENOUS | Status: DC | PRN
  Administered 2015-10-05: 50 mg via INTRAVENOUS

## 2015-10-05 MED ORDER — PROPOFOL 10 MG/ML IV BOLUS
INTRAVENOUS | Status: AC
  Filled 2015-10-05: qty 40

## 2015-10-05 MED ORDER — PROPOFOL 500 MG/50ML IV EMUL
INTRAVENOUS | Status: DC | PRN
  Administered 2015-10-05: 100 ug/kg/min via INTRAVENOUS

## 2015-10-05 SURGICAL SUPPLY — 21 items

## 2015-10-05 NOTE — Anesthesia Postprocedure Evaluation (Signed)
Anesthesia Post Note  Patient: Lauren Wood  Procedure(s) Performed: Procedure(s) (LRB): COLONOSCOPY WITH PROPOFOL (N/A)  Patient location during evaluation: PACU Anesthesia Type: MAC Level of consciousness: awake and alert Pain management: pain level controlled Vital Signs Assessment: post-procedure vital signs reviewed and stable Respiratory status: spontaneous breathing, nonlabored ventilation, respiratory function stable and patient connected to nasal cannula oxygen Cardiovascular status: stable and blood pressure returned to baseline Anesthetic complications: no    Last Vitals:  Filed Vitals:   10/05/15 1150 10/05/15 1200  BP: 145/79 160/108  Pulse: 69 67  Temp:    Resp: 30 30    Last Pain: There were no vitals filed for this visit.               Rossie Scarfone J

## 2015-10-05 NOTE — Anesthesia Preprocedure Evaluation (Signed)
Anesthesia Evaluation  Patient identified by MRN, date of birth, ID band Patient awake    Reviewed: Allergy & Precautions, NPO status , Patient's Chart, lab work & pertinent test results  Airway Mallampati: II  TM Distance: >3 FB Neck ROM: Full    Dental no notable dental hx.    Pulmonary shortness of breath, sleep apnea ,    Pulmonary exam normal breath sounds clear to auscultation       Cardiovascular hypertension, Pt. on medications and Pt. on home beta blockers +CHF and + DOE  Normal cardiovascular exam+ Valvular Problems/Murmurs  Rhythm:Regular Rate:Normal     Neuro/Psych PSYCHIATRIC DISORDERS Schizophrenia negative neurological ROS     GI/Hepatic Neg liver ROS, GERD  ,  Endo/Other  negative endocrine ROS  Renal/GU Renal disease  negative genitourinary   Musculoskeletal  (+) Arthritis ,   Abdominal (+) + obese,   Peds negative pediatric ROS (+)  Hematology negative hematology ROS (+)   Anesthesia Other Findings   Reproductive/Obstetrics negative OB ROS                             Anesthesia Physical Anesthesia Plan  ASA: IV  Anesthesia Plan: MAC   Post-op Pain Management:    Induction: Intravenous  Airway Management Planned: Natural Airway  Additional Equipment:   Intra-op Plan:   Post-operative Plan:   Informed Consent: I have reviewed the patients History and Physical, chart, labs and discussed the procedure including the risks, benefits and alternatives for the proposed anesthesia with the patient or authorized representative who has indicated his/her understanding and acceptance.   Dental advisory given  Plan Discussed with: CRNA  Anesthesia Plan Comments:         Anesthesia Quick Evaluation

## 2015-10-05 NOTE — Transfer of Care (Signed)
Immediate Anesthesia Transfer of Care Note  Patient: Lauren Wood  Procedure(s) Performed: Procedure(s): COLONOSCOPY WITH PROPOFOL (N/A)  Patient Location: PACU  Anesthesia Type:MAC  Level of Consciousness: awake, alert  and oriented  Airway & Oxygen Therapy: Patient Spontanous Breathing and Patient connected to face mask oxygen  Post-op Assessment: Report given to RN and Post -op Vital signs reviewed and stable  Post vital signs: Reviewed and stable  Last Vitals:  Filed Vitals:   10/05/15 1044 10/05/15 1149  BP: 155/78   Pulse: 77 70  Temp: 36.8 C   Resp: 18 31    Last Pain: There were no vitals filed for this visit.       Complications: No apparent anesthesia complications

## 2015-10-05 NOTE — H&P (View-Only) (Signed)
Subjective:    Patient ID: Lauren Wood, female    DOB: 1961-04-03, 55 y.o.   MRN: [id number] CC: colon polyp hx HPI  Nice middle-aged African [place] woman here for f/u - colonoscopy 2015 20 mm adenoma and closer f/u of polypectomy recommended. No active GI sxs reportyed.. She has mental retardation and borderline personality disorder and lives w/ her sister. Also obes and on CPAP. Allergies  Allergen Reactions  . Enalapril Maleate     REACTION: unknown reaction  . Hctz [Hydrochlorothiazide]     Hyperuricemia with Lasix   . Lasix [Furosemide] Other (See Comments)    Hyperuricemia with HCTZ  Hyperuricemia with HCTZ    Outpatient Prescriptions Prior to Visit  Medication Sig Dispense Refill  . aspirin (QC LO-DOSE ASPIRIN) 81 MG EC tablet Take 1 tablet (81 mg total) by mouth daily. Swallow whole. 30 tablet 11  . benztropine (COGENTIN) 0.5 MG tablet Take 0.25 mg by mouth 2 (two) times daily.    Marland Kitchen buPROPion (WELLBUTRIN XL) 150 MG 24 hr tablet Take 150 mg by mouth every morning.    . carvedilol (COREG) 25 MG tablet TAKE ONE TABLET TWICE DAILY WITH A MEAL 60 tablet 4  . CIPRODEX otic suspension Place 4 drops into both ears 2 (two) times daily.     . clonazePAM (KLONOPIN) 0.5 MG tablet Take 0.25 mg by mouth 2 (two) times daily as needed for anxiety (sleep).     . docusate sodium (DOK) 100 MG capsule Take 1 capsule (100 mg total) by mouth 2 (two) times daily as needed for mild constipation or moderate constipation. 60 capsule 1  . FLUoxetine (PROZAC) 40 MG capsule Take 40 mg by mouth every morning.    Marland Kitchen losartan (COZAAR) 50 MG tablet TAKE ONE TABLET TWICE DAILY 60 tablet 6  . mirtazapine (REMERON) 30 MG tablet Take 30 mg by mouth at bedtime.     Marland Kitchen omeprazole (PRILOSEC) 20 MG capsule TAKE TWO CAPSULES EVERY DAY 90 capsule 2  . prazosin (MINIPRESS) 1 MG capsule Take 1 mg by mouth daily.     . risperiDONE (RISPERDAL) 3 MG tablet Take 3 mg by mouth 2 (two) times daily.     Marland Kitchen spironolactone  (ALDACTONE) 25 MG tablet TAKE ONE TABLET EACH DAY 60 tablet 1  . traMADol (ULTRAM) 50 MG tablet Take 50 mg by mouth every 6 (six) hours as needed for moderate pain.    . traZODone (DESYREL) 100 MG tablet Take 100 mg by mouth at bedtime.     . ferrous sulfate 220 (44 FE) MG/5ML solution TAKE 6.110ml DAILY 240 mL 0  . Humidifier/Vaporizer Supplies MISC 1 each by Does not apply route daily. 1 each 0   No facility-administered medications prior to visit.   Past Medical History  Diagnosis Date  . ALLERGIC RHINITIS 08/02/2009  . PRESSURE ULCER OTHER SITE 10/01/2009    hx of  . Boil of vulva 11/07/2012  . HTN (hypertension)   . Hip pain 12/03/2012  . Arthritis   . Obesity   . Borderline personality disorder 05/31/2011  . Unspecified psychosis 12/20/2009  . Mental retardation     child like level  . Sleep apnea     uses cpap, setting of 2  . Personal history of colonic polyps 06/24/2013    06/24/2013 2 cm hepatic flexure polyp and 8 mm descending polyp 06/25/2013 path report of polys --> tubular adenoma w/o high grade dysplasia     . Gout of foot 09/26/2013  L foot 09/2013. In the setting of HCTZ 25 mg and lasix 40 mg daily. Stopped HCTZ after decreasing to 12.5 mg. Treated with colchicine.    Marland Kitchen Heart failure (HCC)     chronic  . Sleep apnea   . Kidney disease     chronic  . Heart murmur    Past Surgical History  Procedure Laterality Date  . Tympanostomy tube placement Bilateral yrs ago  . Cyst removed  2011    forehead  . Colonoscopy N/A 06/24/2013    Procedure: COLONOSCOPY;  Surgeon: Gatha Mayer, MD;  Location: [facility];  Service: Endoscopy;  Laterality: N/A;   Social History   Social History  . Marital Status: Single    Spouse Name: N/A  . Number of Children: 0  . Years of Education: N/A   Social History Main Topics  . Smoking status: Never Smoker   . Smokeless tobacco: Never Used  . Alcohol Use: No  . Drug Use: No  . Sexual Activity: No   Other Topics Concern  . None     Social History Narrative   Goes to adult daycare.  Lives with sister.           Family History  Problem Relation Age of Onset  . Colon cancer Father     around age 66  . Diabetes Mother   . Esophageal cancer Neg Hx   . Pancreatic cancer Neg Hx   . Stomach cancer Neg Hx   . Liver disease Neg Hx    Review of Systems As above    Objective:   Physical Exam @BP  138/88 mmHg  Pulse 80  Ht 5\' 4"  (1.626 m)  Wt 336 lb (152.409 kg)  BMI 57.65 kg/m2  LMP 05/08/2013@  General:  NAD, obese Eyes:   anicteric Lungs:  clear Heart:: S1S2 no rubs, murmurs or gallops Abdomen:  soft and nontender, BS+ Ext:   no edema, cyanosis or clubbing  Data Reviewed:  As per HPI    Assessment & Plan:   Encounter Diagnosis  Name Primary?  . Benign neoplasm of transverse colon Yes    Needs close f/u colonoscopy to make sure polyp completely removed - she is overdue for this - will oproceed  The risks and benefits as well as alternatives of endoscopic procedure(s) have been discussed and reviewed. All questions answered. The patient and her sister agree to proceed.  Will schedule at hospital due to BMI.   15 minutes time spent with patient > half in counseling coordination of care

## 2015-10-05 NOTE — Discharge Instructions (Signed)
° °  I found and removed one tiny polyp. You also have a condition called diverticulosis - common and not usually a problem. Please read the handout provided.  I will let you know pathology results and when to have another routine colonoscopy by mail.  I appreciate the opportunity to care for you. Gatha Mayer, MD, FACG  YOU HAD AN ENDOSCOPIC PROCEDURE TODAY: Refer to the procedure report and other information in the discharge instructions given to you for any specific questions about what was found during the examination. If this information does not answer your questions, please call Dr. Celesta Aver office at [phone] to clarify.   YOU SHOULD EXPECT: Some feelings of bloating in the abdomen. Passage of more gas than usual. Walking can help get rid of the air that was put into your GI tract during the procedure and reduce the bloating. If you had a lower endoscopy (such as a colonoscopy or flexible sigmoidoscopy) you may notice spotting of blood in your stool or on the toilet paper. Some abdominal soreness may be present for a day or two, also.  DIET: Your first meal following the procedure should be a light meal and then it is ok to progress to your normal diet. A half-sandwich or bowl of soup is an example of a good first meal. Heavy or fried foods are harder to digest and may make you feel nauseous or bloated. Drink plenty of fluids but you should avoid alcoholic beverages for 24 hours.   ACTIVITY: Your care partner should take you home directly after the procedure. You should plan to take it easy, moving slowly for the rest of the day. You can resume normal activity the day after the procedure however YOU SHOULD NOT DRIVE, use power tools, machinery or perform tasks that involve climbing or major physical exertion for 24 hours (because of the sedation medicines used during the test).   SYMPTOMS TO REPORT IMMEDIATELY: A gastroenterologist can be reached at any hour. Please call  [phone]  for any of the following symptoms:  Following lower endoscopy (colonoscopy, flexible sigmoidoscopy) Excessive amounts of blood in the stool  Significant tenderness, worsening of abdominal pains  Swelling of the abdomen that is new, acute  Fever of 100 or higher  Following upper endoscopy (EGD, EUS, ERCP, esophageal dilation) Vomiting of blood or coffee ground material  New, significant abdominal pain  New, significant chest pain or pain under the shoulder blades  Painful or persistently difficult swallowing  New shortness of breath  Black, tarry-looking or red, bloody stools  FOLLOW UP:  If any biopsies were taken you will be contacted by phone or by letter within the next 1-3 weeks. Call [phone]  if you have not heard about the biopsies in 3 weeks.  Please also call with any specific questions about appointments or follow up tests.

## 2015-10-05 NOTE — Interval H&P Note (Signed)
History and Physical Interval Note:  10/05/2015 10:57 AM  Lauren Wood  has presented today for surgery, with the diagnosis of benign neoplasm colon  The various methods of treatment have been discussed with the patient and family. After consideration of risks, benefits and other options for treatment, the patient has consented to  Procedure(s): COLONOSCOPY WITH PROPOFOL (N/A) as a surgical intervention .  The patient's history has been reviewed, patient examined, no change in status, stable for surgery.  I have reviewed the patient's chart and labs.  Questions were answered to the patient's satisfaction.     Silvano Rusk

## 2015-10-05 NOTE — Op Note (Signed)
[facility] Patient Name: Lauren Wood Procedure Date: 10/05/2015 MRN: [id number] Attending MD: Gatha Mayer , MD Date of Birth: 1960/07/11 CSN: [id number] Age: 55 Admit Type: Outpatient Procedure:                Colonoscopy Indications:              Surveillance: Piecemeal removal of large sessile                            adenoma last colonoscopy (< 3 yrs) Providers:                Gatha Mayer, MD, William Dalton, Technician,                            Malka So, RN Referring MD:              Medicines:                Monitored Anesthesia Care Complications:            No immediate complications. Estimated Blood Loss:     Estimated blood loss was minimal. Procedure:                Pre-Anesthesia Assessment:                           - Prior to the procedure, a History and Physical                            was performed, and patient medications and                            allergies were reviewed. The patient's tolerance of                            previous anesthesia was also reviewed. The risks                            and benefits of the procedure and the sedation                            options and risks were discussed with the patient.                            All questions were answered, and informed consent                            was obtained. Prior Anticoagulants: The patient                            last took aspirin 1 day prior to the procedure. ASA                            Grade Assessment: III - A patient with severe  systemic disease. After reviewing the risks and                            benefits, the patient was deemed in satisfactory                            condition to undergo the procedure.                           After obtaining informed consent, the colonoscope                            was passed under direct vision. Throughout the                            procedure, the  patient's blood pressure, pulse, and                            oxygen saturations were monitored continuously. The                            EC-[id number]) scope was introduced through                            the anus and advanced to the the cecum, identified                            by appendiceal orifice and ileocecal valve. The                            colonoscopy was performed without difficulty. The                            patient tolerated the procedure well. The quality                            of the bowel preparation was good. The bowel                            preparation used was Miralax. The ileocecal valve,                            appendiceal orifice, and rectum were photographed. Scope In: 11:24:36 AM Scope Out: 11:41:03 AM Scope Withdrawal Time: 0 hours 11 minutes 54 seconds  Total Procedure Duration: 0 hours 16 minutes 27 seconds  Findings:      The perianal and digital rectal examinations were normal.      A 2 mm polyp was found in the transverse colon. The polyp was sessile.       The polyp was removed with a cold biopsy forceps. Resection and       retrieval were complete. Verification of patient identification for the       specimen was done. Estimated blood loss was minimal.      Many diverticula were found in the entire colon. There  was no evidence       of diverticular bleeding.      The exam was otherwise without abnormality on direct and retroflexion       views.      A tattoo was seen in the transverse colon. A post-polypectomy scar was       found at the tattoo site. There was no evidence of residual polyp tissue. Impression:               - One 2 mm polyp in the transverse colon, removed                            with a cold biopsy forceps. Resected and retrieved.                           - Moderate diverticulosis in the entire examined                            colon. There was no evidence of diverticular                             bleeding.                           - The examination was otherwise normal on direct                            and retroflexion views.                           - A tattoo was seen in the transverse colon. A                            post-polypectomy scar was found at the tattoo site.                            There was no evidence of residual polyp tissue. Moderate Sedation:      Please see anesthesia notes, moderate sedation not given Recommendation:           - Patient has a contact number available for                            emergencies. The signs and symptoms of potential                            delayed complications were discussed with the                            patient. Return to normal activities tomorrow.                            Written discharge instructions were provided to the                            patient.                           -  Resume previous diet.                           - Continue present medications.                           - Repeat colonoscopy for surveillance based on                            pathology results. Procedure Code(s):        --- Professional ---                           [phone], Colonoscopy, flexible; with biopsy, single                            or multiple Diagnosis Code(s):        --- Professional ---                           Z86.010, Personal history of colonic polyps                           D12.3, Benign neoplasm of transverse colon (hepatic                            flexure or splenic flexure)                           K57.30, Diverticulosis of large intestine without                            perforation or abscess without bleeding CPT copyright 2016 American [employer] Association. All rights reserved. The codes documented in this report are preliminary and upon coder review may  be revised to meet current compliance requirements. Gatha Mayer, MD 10/05/2015 11:59:11 AM This report has been signed  electronically. Number of Addenda: 0

## 2015-10-06 ENCOUNTER — Encounter (HOSPITAL_COMMUNITY): Payer: Self-pay | Admitting: Internal Medicine

## 2015-10-07 ENCOUNTER — Encounter: Payer: Self-pay | Admitting: Internal Medicine

## 2015-10-07 NOTE — Progress Notes (Signed)
Quick Note:  Single adenoma Recall 2022 Letter created ______

## 2015-11-03 ENCOUNTER — Ambulatory Visit: Payer: Medicare Other | Admitting: Podiatry

## 2015-11-05 ENCOUNTER — Other Ambulatory Visit: Payer: Self-pay | Admitting: Obstetrics and Gynecology

## 2015-12-01 ENCOUNTER — Ambulatory Visit: Payer: Medicare Other | Admitting: Podiatry

## 2015-12-03 ENCOUNTER — Ambulatory Visit: Payer: Medicare Other | Admitting: Obstetrics and Gynecology

## 2015-12-17 ENCOUNTER — Ambulatory Visit (INDEPENDENT_AMBULATORY_CARE_PROVIDER_SITE_OTHER): Payer: Medicare Other | Admitting: Obstetrics and Gynecology

## 2015-12-17 ENCOUNTER — Encounter: Payer: Self-pay | Admitting: Obstetrics and Gynecology

## 2015-12-17 VITALS — BP 142/88 | HR 69 | Temp 98.4°F | Ht 64.0 in | Wt 330.0 lb

## 2015-12-17 DIAGNOSIS — Z1159 Encounter for screening for other viral diseases: Secondary | ICD-10-CM | POA: Diagnosis not present

## 2015-12-17 DIAGNOSIS — Z114 Encounter for screening for human immunodeficiency virus [HIV]: Secondary | ICD-10-CM

## 2015-12-17 DIAGNOSIS — I5030 Unspecified diastolic (congestive) heart failure: Secondary | ICD-10-CM

## 2015-12-17 DIAGNOSIS — Z0000 Encounter for general adult medical examination without abnormal findings: Secondary | ICD-10-CM | POA: Diagnosis not present

## 2015-12-17 DIAGNOSIS — I10 Essential (primary) hypertension: Secondary | ICD-10-CM | POA: Diagnosis present

## 2015-12-17 DIAGNOSIS — N3941 Urge incontinence: Secondary | ICD-10-CM | POA: Diagnosis not present

## 2015-12-17 MED ORDER — LOSARTAN POTASSIUM 100 MG PO TABS: 100.0000 mg | ORAL_TABLET | Freq: Every day | ORAL | 1 refills | Status: DC

## 2015-12-17 MED ORDER — ASPIRIN 81 MG PO TBEC: 81.0000 mg | DELAYED_RELEASE_TABLET | Freq: Every day | ORAL | 11 refills | Status: DC

## 2015-12-17 MED ORDER — SPIRONOLACTONE 25 MG PO TABS: ORAL_TABLET | ORAL | 3 refills | Status: DC

## 2015-12-17 MED ORDER — CARVEDILOL 25 MG PO TABS: ORAL_TABLET | ORAL | 3 refills | Status: DC

## 2015-12-17 NOTE — Progress Notes (Signed)
Subjective: Chief Complaint  Patient presents with  . Follow-up     HPI:   [name] is a 55 year old woman with an extensive medical history including cognitive impairment, CHF, HTN, and OA  in clinic today for follow-up and physical. Concerns today include the following:  #Leg Pain Pain in left leg. Reports difficulty standing from seated position due to discomfort. She states that she has to use both hands and/or a support to get up. Ambulates with assistance using a cane and walker. Has known OA. States pain in leg has been going on for a while. Unable to describe type of pain or rate severity.   #HTN Takes medications daily that are set out by sister. No side effects. Needs medications refilled.   #Urinary incontinence Continues to suffer intermittently from urge incontinence. Wears Depends. Denies stool incontinence.   #CHF -Controlled without any current complaints.  -denies dyspnea Taking Meds: Yes BB, ARB, spironolactone Side effects: None, Tolerating well Last Echo: 2014 Cardiologist last seen: 2014 ROS: Denies symptoms of edema, chest pain, dyspnea, syncope, increased wt  Of note patient brought to clinic by her adult daycare staff. Her sister who usually accompanies to visit and provides information was not at appointment. She was called and stated she just wanted a check up of patient and medication refills.     ROS noted in HPI. Also denies chest pain, falls, abdominal pain. Past Medical, Surgical, Social, and Family History Reviewed & Updated per EMR. Smoking status - Never smoker   Objective: BP (!) 142/88   Pulse 69   Temp 98.4 F (36.9 C) (Oral)   Ht 5\' 4"  (1.626 m)   Wt (!) 330 lb (149.7 kg)   LMP 05/08/2013   BMI 56.64 kg/m  Vitals and nursing notes reviewed  Physical Exam Constitutional: She appears well-developed and well-nourished. No distress.  HENT:  Head: Normocephalic and atraumatic.  Right Ear: Decreased hearing is  noted.  Left Ear: Decreased hearing is noted.  Mouth/Throat: Oropharynx is clear and moist.  Tympanostomy tube in R ear   Eyes: EOM are normal. Pupils are equal, round, and reactive to light. No scleral icterus.  Cardiovascular: Normal rate and regular rhythm. No murmur heard. Pulmonary/Chest: Effort normal and breath sounds normal. She has no wheezes Abd. +BS, soft, non-tender MSK: no edema. Moves all extremities. Skin: No rash noted.    Assessment/Plan: Please see problem based Assessment and Plan   Orders Placed This Encounter  Procedures  . Hepatitis C antibody  . HIV antibody  . CBC  . COMPLETE METABOLIC PANEL WITH GFR    Meds ordered this encounter  Medications  . aspirin (QC LO-DOSE ASPIRIN) 81 MG EC tablet    Sig: Take 1 tablet (81 mg total) by mouth daily. Swallow whole.    Dispense:  30 tablet    Refill:  11  . losartan (COZAAR) 100 MG tablet    Sig: Take 1 tablet (100 mg total) by mouth daily.    Dispense:  60 tablet    Refill:  1    Changed medication Sig around but same amount of medication totaling 100mg . The hope of doing this is to make less pills for patient. Please make patient aware.  . carvedilol (COREG) 25 MG tablet    Sig: TAKE ONE TABLET TWICE DAILY WITH A MEAL    Dispense:  120 tablet    Refill:  3  . spironolactone (ALDACTONE) 25 MG tablet  Sig: TAKE ONE TABLET EACH DAY    Dispense:  60 tablet    Refill:  Lucas, DO 12/17/2015, 12:15 PM PGY-3, [place]

## 2015-12-17 NOTE — Progress Notes (Deleted)
Subjective:     Patient ID: Lauren Wood, female   DOB: December 22, 1960, 55 y.o.   MRN: [id number]  Mrs. Kihana Gladwin is a 55 year old woman with an extensive medical history in clinic today for follow-up and physical. She has no complaints or concerns today. Denies chest pain, falls.  Leg Pain Pain in left leg. Reports difficulty standing from seated position due to discomfort. She states that she has to use both hands and/or a support to get up. Ambulates without assistance using a cane and walker.     Review of Systems Pertinent positives and negatives per HPI.    Objective:   Physical Exam  Constitutional: She appears well-developed and well-nourished. No distress.  HENT:  Head: Normocephalic and atraumatic.  Right Ear: Decreased hearing is noted.  Left Ear: Decreased hearing is noted.  Mouth/Throat: Oropharynx is clear and moist.  Tympanostomy tube in R ear   Eyes: EOM are normal. Pupils are equal, round, and reactive to light. No scleral icterus.  Cardiovascular: Normal rate and regular rhythm.   No murmur heard. Pulmonary/Chest: Effort normal and breath sounds normal. She has no wheezes.  Skin: No rash noted.   Assessment:     ***    Plan:

## 2015-12-17 NOTE — Patient Instructions (Addendum)
Medications refilled and sent to pharmacy Needs mammogram. Paper attached Blood work collected. Will contact you about results  Follow-up in 3 months   Health Maintenance, Female Adopting a healthy lifestyle and getting preventive care can go a long way to promote health and wellness. Talk with your health care provider about what schedule of regular examinations is right for you. This is a good chance for you to check in with your provider about disease prevention and staying healthy. In between checkups, there are plenty of things you can do on your own. Experts have done a lot of research about which lifestyle changes and preventive measures are most likely to keep you healthy. Ask your health care provider for more information. WEIGHT AND DIET  Eat a healthy diet  Be sure to include plenty of vegetables, fruits, low-fat dairy products, and lean protein.  Do not eat a lot of foods high in solid fats, added sugars, or salt.  Get regular exercise. This is one of the most important things you can do for your health.  Most adults should exercise for at least 150 minutes each week. The exercise should increase your heart rate and make you sweat (moderate-intensity exercise).  Most adults should also do strengthening exercises at least twice a week. This is in addition to the moderate-intensity exercise.  Maintain a healthy weight  Body mass index (BMI) is a measurement that can be used to identify possible weight problems. It estimates body fat based on height and weight. Your health care provider can help determine your BMI and help you achieve or maintain a healthy weight.  For females 45 years of age and older:   A BMI below 18.5 is considered underweight.  A BMI of 18.5 to 24.9 is normal.  A BMI of 25 to 29.9 is considered overweight.  A BMI of 30 and above is considered obese.  Watch levels of cholesterol and blood lipids  You should start having your blood tested for lipids  and cholesterol at 56 years of age, then have this test every 5 years.  You may need to have your cholesterol levels checked more often if:  Your lipid or cholesterol levels are high.  You are older than 56 years of age.  You are at high risk for heart disease.  CANCER SCREENING   Lung Cancer  Lung cancer screening is recommended for adults 74-45 years old who are at high risk for lung cancer because of a history of smoking.  A yearly low-dose CT scan of the lungs is recommended for people who:  Currently smoke.  Have quit within the past 15 years.  Have at least a 30-pack-year history of smoking. A pack year is smoking an average of one pack of cigarettes a day for 1 year.  Yearly screening should continue until it has been 15 years since you quit.  Yearly screening should stop if you develop a health problem that would prevent you from having lung cancer treatment.  Breast Cancer  Practice breast self-awareness. This means understanding how your breasts normally appear and feel.  It also means doing regular breast self-exams. Let your health care provider know about any changes, no matter how small.  If you are in your 20s or 30s, you should have a clinical breast exam (CBE) by a health care provider every 1-3 years as part of a regular health exam.  If you are 55 or older, have a CBE every year. Also consider having a breast X-ray (  mammogram) every year.  If you have a family history of breast cancer, talk to your health care provider about genetic screening.  If you are at high risk for breast cancer, talk to your health care provider about having an MRI and a mammogram every year.  Breast cancer gene (BRCA) assessment is recommended for women who have family members with BRCA-related cancers. BRCA-related cancers include:  Breast.  Ovarian.  Tubal.  Peritoneal cancers.  Results of the assessment will determine the need for genetic counseling and BRCA1 and  BRCA2 testing. Cervical Cancer Your health care provider may recommend that you be screened regularly for cancer of the pelvic organs (ovaries, uterus, and vagina). This screening involves a pelvic examination, including checking for microscopic changes to the surface of your cervix (Pap test). You may be encouraged to have this screening done every 3 years, beginning at age 85.  For women ages 60-65, health care providers may recommend pelvic exams and Pap testing every 3 years, or they may recommend the Pap and pelvic exam, combined with testing for human papilloma virus (HPV), every 5 years. Some types of HPV increase your risk of cervical cancer. Testing for HPV may also be done on women of any age with unclear Pap test results.  Other health care providers may not recommend any screening for nonpregnant women who are considered low risk for pelvic cancer and who do not have symptoms. Ask your health care provider if a screening pelvic exam is right for you.  If you have had past treatment for cervical cancer or a condition that could lead to cancer, you need Pap tests and screening for cancer for at least 20 years after your treatment. If Pap tests have been discontinued, your risk factors (such as having a new sexual partner) need to be reassessed to determine if screening should resume. Some women have medical problems that increase the chance of getting cervical cancer. In these cases, your health care provider may recommend more frequent screening and Pap tests. Colorectal Cancer  This type of cancer can be detected and often prevented.  Routine colorectal cancer screening usually begins at 56 years of age and continues through 56 years of age.  Your health care provider may recommend screening at an earlier age if you have risk factors for colon cancer.  Your health care provider may also recommend using home test kits to check for hidden blood in the stool.  A small camera at the end of  a tube can be used to examine your colon directly (sigmoidoscopy or colonoscopy). This is done to check for the earliest forms of colorectal cancer.  Routine screening usually begins at age 47.  Direct examination of the colon should be repeated every 5-10 years through 56 years of age. However, you may need to be screened more often if early forms of precancerous polyps or small growths are found. Skin Cancer  Check your skin from head to toe regularly.  Tell your health care provider about any new moles or changes in moles, especially if there is a change in a mole's shape or color.  Also tell your health care provider if you have a mole that is larger than the size of a pencil eraser.  Always use sunscreen. Apply sunscreen liberally and repeatedly throughout the day.  Protect yourself by wearing long sleeves, pants, a wide-brimmed hat, and sunglasses whenever you are outside. HEART DISEASE, DIABETES, AND HIGH BLOOD PRESSURE   High blood pressure causes heart disease  and increases the risk of stroke. High blood pressure is more likely to develop in:  People who have blood pressure in the high end of the normal range (130-139/85-89 mm Hg).  People who are overweight or obese.  People who are African American.  If you are 44-68 years of age, have your blood pressure checked every 3-5 years. If you are 64 years of age or older, have your blood pressure checked every year. You should have your blood pressure measured twice--once when you are at a hospital or clinic, and once when you are not at a hospital or clinic. Record the average of the two measurements. To check your blood pressure when you are not at a hospital or clinic, you can use:  An automated blood pressure machine at a pharmacy.  A home blood pressure monitor.  If you are between 66 years and 57 years old, ask your health care provider if you should take aspirin to prevent strokes.  Have regular diabetes screenings. This  involves taking a blood sample to check your fasting blood sugar level.  If you are at a normal weight and have a low risk for diabetes, have this test once every three years after 56 years of age.  If you are overweight and have a high risk for diabetes, consider being tested at a younger age or more often. PREVENTING INFECTION  Hepatitis B  If you have a higher risk for hepatitis B, you should be screened for this virus. You are considered at high risk for hepatitis B if:  You were born in a country where hepatitis B is common. Ask your health care provider which countries are considered high risk.  Your parents were born in a high-risk country, and you have not been immunized against hepatitis B (hepatitis B vaccine).  You have HIV or AIDS.  You use needles to inject street drugs.  You live with someone who has hepatitis B.  You have had sex with someone who has hepatitis B.  You get hemodialysis treatment.  You take certain medicines for conditions, including cancer, organ transplantation, and autoimmune conditions. Hepatitis C  Blood testing is recommended for:  Everyone born from [birth year] through 1966.  Anyone with known risk factors for hepatitis C. Sexually transmitted infections (STIs)  You should be screened for sexually transmitted infections (STIs) including gonorrhea and chlamydia if:  You are sexually active and are younger than 56 years of age.  You are older than 56 years of age and your health care provider tells you that you are at risk for this type of infection.  Your sexual activity has changed since you were last screened and you are at an increased risk for chlamydia or gonorrhea. Ask your health care provider if you are at risk.  If you do not have HIV, but are at risk, it may be recommended that you take a prescription medicine daily to prevent HIV infection. This is called pre-exposure prophylaxis (PrEP). You are considered at risk if:  You are  sexually active and do not regularly use condoms or know the HIV status of your partner(s).  You take drugs by injection.  You are sexually active with a partner who has HIV. Talk with your health care provider about whether you are at high risk of being infected with HIV. If you choose to begin PrEP, you should first be tested for HIV. You should then be tested every 3 months for as long as you are taking PrEP.  PREGNANCY   If you are premenopausal and you may become pregnant, ask your health care provider about preconception counseling.  If you may become pregnant, take 400 to 800 micrograms (mcg) of folic acid every day.  If you want to prevent pregnancy, talk to your health care provider about birth control (contraception). OSTEOPOROSIS AND MENOPAUSE   Osteoporosis is a disease in which the bones lose minerals and strength with aging. This can result in serious bone fractures. Your risk for osteoporosis can be identified using a bone density scan.  If you are 48 years of age or older, or if you are at risk for osteoporosis and fractures, ask your health care provider if you should be screened.  Ask your health care provider whether you should take a calcium or vitamin D supplement to lower your risk for osteoporosis.  Menopause may have certain physical symptoms and risks.  Hormone replacement therapy may reduce some of these symptoms and risks. Talk to your health care provider about whether hormone replacement therapy is right for you.  HOME CARE INSTRUCTIONS   Schedule regular health, dental, and eye exams.  Stay current with your immunizations.   Do not use any tobacco products including cigarettes, chewing tobacco, or electronic cigarettes.  If you are pregnant, do not drink alcohol.  If you are breastfeeding, limit how much and how often you drink alcohol.  Limit alcohol intake to no more than 1 drink per day for nonpregnant women. One drink equals 12 ounces of beer, 5  ounces of wine, or 1 ounces of hard liquor.  Do not use street drugs.  Do not share needles.  Ask your health care provider for help if you need support or information about quitting drugs.  Tell your health care provider if you often feel depressed.  Tell your health care provider if you have ever been abused or do not feel safe at home.   This information is not intended to replace advice given to you by your health care provider. Make sure you discuss any questions you have with your health care provider.   Document Released: 11/14/2010 Document Revised: 05/22/2014 Document Reviewed: 04/02/2013 Elsevier Interactive Patient Education [employer].

## 2015-12-19 NOTE — Assessment & Plan Note (Signed)
Chronic. Well-controlled. Stable. Continue therapies. Bloodwork collected today.

## 2015-12-19 NOTE — Assessment & Plan Note (Signed)
Chronic long-standing well controlled condition. BP mildly elevated today. Compliant with medication with help of her sister/caretaker. No adjustments to medications at this time. Blood work collected.

## 2015-12-19 NOTE — Assessment & Plan Note (Signed)
Chronic and stable.   

## 2015-12-19 NOTE — Assessment & Plan Note (Signed)
Colonoscopy done this year. Needs mammogram Blood work collected: CBC, CMP, HIV, Hep C Encouraged continued healthy lifestyle including heart diet and regular exercise Medications refilled

## 2015-12-21 ENCOUNTER — Ambulatory Visit: Payer: Medicare Other | Admitting: Podiatry

## 2015-12-22 ENCOUNTER — Encounter: Payer: Self-pay | Admitting: Podiatry

## 2015-12-22 ENCOUNTER — Ambulatory Visit (INDEPENDENT_AMBULATORY_CARE_PROVIDER_SITE_OTHER): Payer: Medicare Other | Admitting: Podiatry

## 2015-12-22 DIAGNOSIS — M79674 Pain in right toe(s): Secondary | ICD-10-CM | POA: Diagnosis not present

## 2015-12-22 DIAGNOSIS — M79675 Pain in left toe(s): Secondary | ICD-10-CM

## 2015-12-22 DIAGNOSIS — B351 Tinea unguium: Secondary | ICD-10-CM | POA: Diagnosis not present

## 2015-12-22 NOTE — Progress Notes (Signed)
Patient ID: Lauren Wood, female   DOB: [birth year], 55 y.o.   MRN: [id number]  Subjective: This patient presents for scheduled visit complaining of painful toenails walking wearing shoes requesting nail debridement  Objective: No open skin lesions bilaterally The toenails are elongated, incurvated, discolored, hypertrophic and tender to direct palpation 6-10  Assessment: Symptomatic onychomycoses 6-10  Plan: Debridement toenails 10 mechanically and electrically without any bleeding  Reappoint 3 months

## 2016-01-05 ENCOUNTER — Other Ambulatory Visit: Payer: Self-pay | Admitting: Obstetrics and Gynecology

## 2016-02-11 DIAGNOSIS — Z9622 Myringotomy tube(s) status: Secondary | ICD-10-CM | POA: Insufficient documentation

## 2016-02-17 ENCOUNTER — Ambulatory Visit (INDEPENDENT_AMBULATORY_CARE_PROVIDER_SITE_OTHER): Payer: Medicare Other | Admitting: *Deleted

## 2016-02-17 DIAGNOSIS — Z23 Encounter for immunization: Secondary | ICD-10-CM | POA: Diagnosis present

## 2016-03-04 ENCOUNTER — Other Ambulatory Visit: Payer: Self-pay | Admitting: Obstetrics and Gynecology

## 2016-03-22 ENCOUNTER — Encounter: Payer: Self-pay | Admitting: Podiatry

## 2016-03-22 ENCOUNTER — Ambulatory Visit (INDEPENDENT_AMBULATORY_CARE_PROVIDER_SITE_OTHER): Payer: Medicare Other | Admitting: Podiatry

## 2016-03-22 VITALS — BP 172/95 | HR 84 | Resp 18

## 2016-03-22 DIAGNOSIS — B351 Tinea unguium: Secondary | ICD-10-CM

## 2016-03-22 DIAGNOSIS — M79675 Pain in left toe(s): Secondary | ICD-10-CM | POA: Diagnosis not present

## 2016-03-22 DIAGNOSIS — M79674 Pain in right toe(s): Secondary | ICD-10-CM | POA: Diagnosis not present

## 2016-03-22 NOTE — Progress Notes (Signed)
Patient ID: Lauren Wood, female   DOB: 11/22/1960, 55 y.o.   MRN: [id number]    Subjective: This patient presents for scheduled visit complaining of painful toenails walking wearing shoes requesting nail debridement  Objective: Patient appears to be orientated and does respond to questioning DP pulses 2/4 bilaterally PT pulses 1/4 bilaterally Capillary reflex immediate bilaterally Sensation to 10 g monofilament wire intact 4/5 right and 5/5 left Vibratory sensation reactive bilaterally Ankle reflex equal and reactive bilaterally Pes planus bilaterally Manual motor testing dorsi flexion, plantar flexion, inversion, eversion 5/5 bilaterally Patient walks slowly with cane No open skin lesions bilaterally The toenails are elongated, incurvated, discolored, hypertrophic and tender to direct palpation 6-10  Assessment: Symptomatic onychomycoses 6-10  Plan: Debridement toenails 10 mechanically and electrically without any bleeding  Reappoint 3 months 

## 2016-05-10 ENCOUNTER — Encounter: Payer: Self-pay | Admitting: Obstetrics and Gynecology

## 2016-05-10 NOTE — Progress Notes (Signed)
SCAT form completed and ready to be picked up. Will leave at front office for pick up. Thanks.

## 2016-05-10 NOTE — Progress Notes (Signed)
LM on [phone] for guardian to call back.  Please inform that scat forms are ready for pick up. Grete Bosko,CMA

## 2016-05-26 ENCOUNTER — Other Ambulatory Visit: Payer: Self-pay | Admitting: Family Medicine

## 2016-05-26 DIAGNOSIS — Z1231 Encounter for screening mammogram for malignant neoplasm of breast: Secondary | ICD-10-CM

## 2016-05-29 ENCOUNTER — Other Ambulatory Visit: Payer: Self-pay | Admitting: Obstetrics and Gynecology

## 2016-06-08 ENCOUNTER — Ambulatory Visit
Admission: RE | Admit: 2016-06-08 | Discharge: 2016-06-08 | Disposition: A | Discharge status: Home / Self Care | Payer: Medicare Other | Source: Ambulatory Visit | Attending: Family Medicine | Admitting: Family Medicine

## 2016-06-08 DIAGNOSIS — Z1231 Encounter for screening mammogram for malignant neoplasm of breast: Secondary | ICD-10-CM

## 2016-06-12 ENCOUNTER — Telehealth: Payer: Self-pay | Admitting: Internal Medicine

## 2016-06-12 ENCOUNTER — Ambulatory Visit (HOSPITAL_COMMUNITY)
Admission: RE | Admit: 2016-06-12 | Discharge: 2016-06-12 | Disposition: A | Discharge status: Home / Self Care | Payer: Medicare Other | Source: Ambulatory Visit | Attending: Family Medicine | Admitting: Family Medicine

## 2016-06-12 ENCOUNTER — Ambulatory Visit (INDEPENDENT_AMBULATORY_CARE_PROVIDER_SITE_OTHER): Payer: Medicare Other | Admitting: Internal Medicine

## 2016-06-12 VITALS — BP 110/86 | HR 97 | Temp 99.8°F | Wt 341.0 lb

## 2016-06-12 DIAGNOSIS — R058 Other specified cough: Secondary | ICD-10-CM

## 2016-06-12 DIAGNOSIS — R05 Cough: Secondary | ICD-10-CM | POA: Diagnosis present

## 2016-06-12 DIAGNOSIS — J189 Pneumonia, unspecified organism: Secondary | ICD-10-CM | POA: Diagnosis not present

## 2016-06-12 DIAGNOSIS — G4733 Obstructive sleep apnea (adult) (pediatric): Secondary | ICD-10-CM | POA: Diagnosis not present

## 2016-06-12 MED ORDER — AZITHROMYCIN 250 MG PO TABS: ORAL_TABLET | ORAL | 0 refills | Status: DC

## 2016-06-12 MED ORDER — CEFDINIR 300 MG PO CAPS: 300.0000 mg | ORAL_CAPSULE | Freq: Two times a day (BID) | ORAL | 0 refills | Status: DC

## 2016-06-12 MED ORDER — GUAIFENESIN-DM 100-10 MG/5ML PO SYRP: 5.0000 mL | ORAL_SOLUTION | ORAL | 0 refills | Status: DC | PRN

## 2016-06-12 NOTE — Patient Instructions (Signed)
It was so nice to meet you!  I have ordered a chest x-ray. Please go over to the hospital to have this done. I will call you with the results. I have prescribed some cough medicine. You should also use hot tea with honey to help with the cough.  If she is not getting better over the next week, please come back to see [place]!  -Dr. Brett Albino

## 2016-06-12 NOTE — Progress Notes (Signed)
   [facility] Phone: [phone]  Subjective:  Lauren Wood is a 56 year old female presenting to same day clinic with productive cough, congestion, and chills for the past 2 days. Her cough is productive of Alfieri sputum. She has had rhinorrhea and sore throat. She denies any fevers or muscle aches. She denies chest pain or shortness of breath. She has been using Theraflu at home with no relief. She denies any nausea or vomiting. She has multiple sick contacts at home including her sister and nephew. She states she has not been eating as well because she doesn't have a good appetite.  Sister is also wanting to have an order placed for a new CPAP machine.  ROS: See HPI for pertinent positives and negatives  Past Medical History- HTN, HFpEF, FERD, CKD III, morbid obesity, mental retardation.  Family history reviewed for today's visit. No changes.  Social history- patient is a never smoker  Objective: BP 110/86 (BP Location: Right Wrist, Patient Position: Sitting, Cuff Size: Small)   Pulse 97   Temp 99.8 F (37.7 C) (Oral)   Wt (!) 341 lb (154.7 kg)   LMP 05/08/2013   SpO2 95%   BMI 58.53 kg/m  Gen: NAD, alert, cooperative with exam HEENT: NCAT, EOMI, MMM, TMs clear, oropharynx normal Neck: FROM, supple, no cervical lymphadenopathy CV: RRR, no murmur Resp: Normal work of breathing, focal area of decreased air movement in the left lower lung field, lungs otherwise clear  Assessment/Plan: Productive Cough: Concern for pneumonia given productive cough, chills, and focal lung findings on exam. She could also have a viral URI given her congestion, rhinorrhea, and sore throat. - Will send patient for CXR to rule out pneumonia. - Robitussin to help with cough - Will call patient's sister to let her know the results.  OSA: Sister requesting an order for a new CPAP machine. - Order faxed to [facility]   Hyman Bible, MD PGY-2

## 2016-06-12 NOTE — Assessment & Plan Note (Addendum)
Concern for pneumonia given productive cough, chills, and focal lung findings on exam. She could also have a viral URI given her congestion, rhinorrhea, and sore throat. - Will send patient for CXR to rule out pneumonia. - Robitussin to help with cough - Will call patient's sister to let her know the results.

## 2016-06-12 NOTE — Assessment & Plan Note (Signed)
Sister requesting an order for a new CPAP machine. - Order faxed to [facility]

## 2016-06-12 NOTE — Telephone Encounter (Signed)
Patient seen in same day clinic this morning and had a chest x-ray performed. CXR showed pneumonia. Will treat with Azithromycin and Omnicef x 5 days. I called patient's sister and spoke with her on the phone. If patient is not much better after completing the course of antibiotics, she should be seen in our clinic for follow-up.  The radiologist also recommended that patient have a follow-up CXR performed in 3-4 weeks after antibiotic course to ensure resolution and exclude underlying malignancy.  Will forward note to PCP.  Hyman Bible, MD

## 2016-06-20 ENCOUNTER — Ambulatory Visit: Payer: Medicare Other | Admitting: Family Medicine

## 2016-06-21 ENCOUNTER — Ambulatory Visit (INDEPENDENT_AMBULATORY_CARE_PROVIDER_SITE_OTHER): Payer: Medicare Other | Admitting: Student

## 2016-06-21 ENCOUNTER — Encounter: Payer: Self-pay | Admitting: Student

## 2016-06-21 ENCOUNTER — Ambulatory Visit: Payer: Medicare Other | Admitting: Podiatry

## 2016-06-21 DIAGNOSIS — J189 Pneumonia, unspecified organism: Secondary | ICD-10-CM | POA: Insufficient documentation

## 2016-06-21 NOTE — Assessment & Plan Note (Signed)
Now resolved, s/p azithromycin.  - will continue to monitor as needed

## 2016-06-21 NOTE — Progress Notes (Signed)
   Subjective:    Patient ID: Lauren Wood, female    DOB: [birth year], 56 y.o.   MRN: [id number]   CC: f/u pneumonia  HPI: 56 y/o presents for follow up pneumonia  Pneumonia - diagnosed on 1/29 and started on Azithromycin - she has since noted marked improvement - no fevers - no SOB or chest pain - she does have residual mild cough  Smoking status reviewed  Review of Systems  Per HPI   Objective:  BP 133/82   Pulse 79   Temp 98.4 F (36.9 C) (Oral)   Wt (!) 349 lb (158.3 kg)   LMP 05/08/2013   SpO2 98%   BMI 59.91 kg/m  Vitals and nursing note reviewed  General: NAD Cardiac: RRR Respiratory: CTAB, normal effort Skin: warm and dry, no rashes noted Neuro: alert and oriented, no focal deficits  Assessment & Plan:    Pneumonia Now resolved, s/p azithromycin.  - will continue to monitor as needed    Azelia Reiger A. Lincoln Brigham MD, [facility] Resident PGY-3 Pager [phone]

## 2016-06-21 NOTE — Patient Instructions (Signed)
Follow up with PCP as needed If you have questions or concerns, call the office at [phone]

## 2016-07-19 ENCOUNTER — Ambulatory Visit (INDEPENDENT_AMBULATORY_CARE_PROVIDER_SITE_OTHER): Payer: Medicare Other | Admitting: Podiatry

## 2016-07-19 ENCOUNTER — Encounter: Payer: Self-pay | Admitting: Podiatry

## 2016-07-19 DIAGNOSIS — M79674 Pain in right toe(s): Secondary | ICD-10-CM | POA: Diagnosis not present

## 2016-07-19 DIAGNOSIS — B351 Tinea unguium: Secondary | ICD-10-CM

## 2016-07-19 DIAGNOSIS — M79675 Pain in left toe(s): Secondary | ICD-10-CM

## 2016-07-19 NOTE — Progress Notes (Signed)
Patient ID: AMMANDA DOBBINS, female   DOB: 06-16-1960, 56 y.o.   MRN: [id number]    Subjective: This patient presents for scheduled visit complaining of painful toenails walking wearing shoes requesting nail debridement  Objective: Patient appears to be orientated and does respond to questioning DP pulses 2/4 bilaterally PT pulses 1/4 bilaterally Capillary reflex immediate bilaterally Sensation to 10 g monofilament wire intact 4/5 right and 5/5 left Vibratory sensation reactive bilaterally Ankle reflex equal and reactive bilaterally Pes planus bilaterally Manual motor testing dorsi flexion, plantar flexion, inversion, eversion 5/5 bilaterally Patient walks slowly with cane No open skin lesions bilaterally The toenails are elongated, incurvated, discolored, hypertrophic and tender to direct palpation 6-10  Assessment: Symptomatic onychomycoses 6-10  Plan: Debridement toenails 10 mechanically and electrically without any bleeding  Reappoint 3 months

## 2016-08-03 ENCOUNTER — Ambulatory Visit: Payer: Medicare Other | Admitting: Obstetrics and Gynecology

## 2016-08-07 ENCOUNTER — Ambulatory Visit: Payer: Medicare Other | Admitting: Obstetrics and Gynecology

## 2016-08-29 ENCOUNTER — Telehealth: Payer: Self-pay | Admitting: Obstetrics and Gynecology

## 2016-08-29 NOTE — Telephone Encounter (Signed)
Will forward to MD to place a referral. Jazmin Hartsell,CMA

## 2016-08-29 NOTE — Telephone Encounter (Signed)
Patient called today requesting to be referred to:  Specialist:  ENT Provider name/phone number:  [facility] [phone] Diagnosis (code):  " to get ears checked" Date of last OV: 07-19-16  Note routed to provider and Referral Coordinator [facility]).  Will call patient back regarding referral status.  Renella Cunas   Sister states pt has been going there for years and now [place] office is requesting a referral.

## 2016-08-30 NOTE — Telephone Encounter (Signed)
2nd request. Sister would like to be called after referral has been placed [phone]. ep

## 2016-08-30 NOTE — Telephone Encounter (Signed)
Contacted pts sister and informed her that pcp will be in office tomm and referral should be placed then.

## 2016-08-31 NOTE — Telephone Encounter (Signed)
Can you get more details. I do not believe getting ears checked would go to ENT. Patient has never seen an ENT doctor but she has been to audiology before for hearing loss. Is this what is being requested?

## 2016-08-31 NOTE — Telephone Encounter (Signed)
LM for sister to call back to get more information on referral. Jazmin Hartsell,CMA

## 2016-09-05 ENCOUNTER — Ambulatory Visit (INDEPENDENT_AMBULATORY_CARE_PROVIDER_SITE_OTHER): Payer: Medicare Other | Admitting: Obstetrics and Gynecology

## 2016-09-05 ENCOUNTER — Encounter: Payer: Self-pay | Admitting: Obstetrics and Gynecology

## 2016-09-05 VITALS — BP 135/80 | HR 86 | Temp 98.0°F | Ht 64.0 in | Wt 354.0 lb

## 2016-09-05 DIAGNOSIS — G2581 Restless legs syndrome: Secondary | ICD-10-CM | POA: Diagnosis not present

## 2016-09-05 DIAGNOSIS — N183 Chronic kidney disease, stage 3 unspecified: Secondary | ICD-10-CM

## 2016-09-05 DIAGNOSIS — F29 Unspecified psychosis not due to a substance or known physiological condition: Secondary | ICD-10-CM

## 2016-09-05 DIAGNOSIS — Z1159 Encounter for screening for other viral diseases: Secondary | ICD-10-CM

## 2016-09-05 DIAGNOSIS — Z0000 Encounter for general adult medical examination without abnormal findings: Secondary | ICD-10-CM

## 2016-09-05 DIAGNOSIS — Z114 Encounter for screening for human immunodeficiency virus [HIV]: Secondary | ICD-10-CM

## 2016-09-05 DIAGNOSIS — H9192 Unspecified hearing loss, left ear: Secondary | ICD-10-CM | POA: Diagnosis not present

## 2016-09-05 DIAGNOSIS — I10 Essential (primary) hypertension: Secondary | ICD-10-CM | POA: Diagnosis not present

## 2016-09-05 MED ORDER — PREGABALIN 50 MG PO CAPS: ORAL_CAPSULE | ORAL | 0 refills | Status: DC

## 2016-09-05 NOTE — Assessment & Plan Note (Signed)
Will collect Hep C and HIV then patient will be up to date. Weight increasing. Told patient to continue to work on lifestyle changes and exercising.

## 2016-09-05 NOTE — Patient Instructions (Signed)

## 2016-09-05 NOTE — Assessment & Plan Note (Signed)
Based off history. Will treat with low dose Lyrica at this time and then reassess. Patient to discuss medication with psychiatrist as well.

## 2016-09-05 NOTE — Assessment & Plan Note (Addendum)
Unspecified psychosis. Patient has long-standing history of cutting, depression, hallucinations, and suicidal ideation. Follows with psychiatry. Keep appointments. Mood stable today with no red flags. No medication adjustments.

## 2016-09-05 NOTE — Telephone Encounter (Signed)
Discussed at today's visit and referral placed.

## 2016-09-05 NOTE — Progress Notes (Signed)
    Accompanied to appointment by sister whom is caretaker  Subjective: Chief Complaint  Patient presents with  . leg cramps     HPI: Lauren Wood is a 56 y.o. presenting to clinic today to discuss the following:  #Leg Cramps Has had for a while but getting worse Waking her out of sleep Mainly at night Both legs Some weakness when cramps come on Painful No changes to diet No illness recently No new medications  #Hearing Screen Sister wants referral to ENT; Goes to [facility] E[facility] seeing Dr. Redmond Baseman Has been going for years and never needed referral but now does (new insurance) h/o tubes in ear Wears hearing aide Goes for routine check-up and audiology Denies any ear pain or fevers  #CKD Has known CKD3 Does not follow with renal Denies hematuria, oliguria, polyuria  #HTN Taking all medications Denies side effects Needs refills BP well controlled at home  #Mental Health Follows with psychiatrist tomorrow No changes to medications recently Stable mood Denies HI, SI, hallucinations   Health Maintenance Due  Topic Date Due  . Hepatitis C Screening  07-28-1960  . HIV Screening  03/17/1975    ROS noted in HPI.   Past Medical, Surgical, Social, and Family History Reviewed & Updated per EMR.    History  Smoking Status  . Never Smoker  Smokeless Tobacco  . Never Used    Objective: BP 135/80   Pulse 86   Temp 98 F (36.7 C) (Oral)   Ht 5\' 4"  (1.626 m)   Wt (!) 354 lb (160.6 kg)   LMP 05/08/2013   SpO2 95%   BMI 60.76 kg/m  Vitals and nursing notes reviewed  Physical Exam  Constitutional: She is well-developed, well-nourished, and in no distress.  Obese  HENT:  Hearing aide in left ear  Eyes: EOM are normal.  Cardiovascular: Normal rate, regular rhythm and normal heart sounds.   Pulmonary/Chest: Effort normal and breath sounds normal.  Musculoskeletal: Normal range of motion.  Neurological: She is alert. She has normal strength. No cranial  nerve deficit. She exhibits normal muscle tone.  Walks with cane  Skin: Skin is warm and dry.  Well healed arm lacerations  Psychiatric: Mood and affect normal.    Assessment/Plan: Please see problem based Assessment and Plan PATIENT EDUCATION PROVIDED: See AVS    Diagnosis and plan along with any newly prescribed medication(s) were discussed in detail with this patient today. The patient verbalized understanding and agreed with the plan.    Orders Placed This Encounter  Procedures  . Comprehensive metabolic panel    Order Specific Question:   Has the patient fasted?    Answer:   No  . Hepatitis C antibody  . HIV antibody  . CBC  . Ambulatory referral to ENT    Referral Priority:   Routine    Referral Type:   Consultation    Referral Reason:   Specialty Services Required    Requested Specialty:   Otolaryngology    Number of Visits Requested:   1    Meds ordered this encounter  Medications  . pregabalin (LYRICA) 50 MG capsule    Sig: Take one tablet (50mg ) 1 to 3 hours before bedtime    Dispense:  30 capsule    Refill:  [place], DO 09/05/2016, 8:44 AM PGY-3, [place]

## 2016-09-05 NOTE — Assessment & Plan Note (Signed)
Collect BMP today. Patient will likely need referral to nephrology for CKD stage 3.

## 2016-09-05 NOTE — Assessment & Plan Note (Signed)
Follows with [facility] ENT. Referral placed for patient to follow-up. Has hearing loss. Wears hearing aide in left ear. No acute changes.

## 2016-09-05 NOTE — Assessment & Plan Note (Signed)
Well-controlled. Compliant with medications. No adjustments at this time. Will collect blood work today. Refills given.

## 2016-09-06 LAB — COMPREHENSIVE METABOLIC PANEL
ALT: 5 IU/L (ref 0–32)
AST: 13 IU/L (ref 0–40)
Albumin/Globulin Ratio: 1.3 (ref 1.2–2.2)
Albumin: 4.1 g/dL (ref 3.5–5.5)
Alkaline Phosphatase: 157 IU/L — ABNORMAL HIGH (ref 39–117)
BUN/Creatinine Ratio: 17 (ref 9–23)
BUN: 19 mg/dL (ref 6–24)
Bilirubin Total: 0.2 mg/dL (ref 0.0–1.2)
CO2: 22 mmol/L (ref 18–29)
Calcium: 9.6 mg/dL (ref 8.7–10.2)
Chloride: 102 mmol/L (ref 96–106)
Creatinine, Ser: 1.09 mg/dL — ABNORMAL HIGH (ref 0.57–1.00)
GFR calc Af Amer: 66 mL/min/{1.73_m2} (ref >59)
GFR calc non Af Amer: 57 mL/min/{1.73_m2} — ABNORMAL LOW (ref >59)
Globulin, Total: 3.2 g/dL (ref 1.5–4.5)
Glucose: 83 mg/dL (ref 65–99)
Potassium: 4.7 mmol/L (ref 3.5–5.2)
Sodium: 143 mmol/L (ref 134–144)
Total Protein: 7.3 g/dL (ref 6.0–8.5)

## 2016-09-06 LAB — CBC
Hematocrit: 37.1 % (ref 34.0–46.6)
Hemoglobin: 11.9 g/dL (ref 11.1–15.9)
MCH: 26.7 pg (ref 26.6–33.0)
MCHC: 32.1 g/dL (ref 31.5–35.7)
MCV: 83 fL (ref 79–97)
Platelets: 260 10*3/uL (ref 150–379)
RBC: 4.45 x10E6/uL (ref 3.77–5.28)
RDW: 15.5 % — ABNORMAL HIGH (ref 12.3–15.4)
WBC: 8.8 10*3/uL (ref 3.4–10.8)

## 2016-09-06 LAB — HEPATITIS C ANTIBODY: Hep C Virus Ab: <0.1 s/co ratio (ref 0.0–0.9)

## 2016-09-06 LAB — HIV ANTIBODY (ROUTINE TESTING W REFLEX): HIV Screen 4th Generation wRfx: NONREACTIVE

## 2016-09-07 ENCOUNTER — Encounter: Payer: Self-pay | Admitting: Obstetrics and Gynecology

## 2016-10-24 ENCOUNTER — Encounter: Payer: Self-pay | Admitting: Podiatry

## 2016-10-24 ENCOUNTER — Ambulatory Visit (INDEPENDENT_AMBULATORY_CARE_PROVIDER_SITE_OTHER): Payer: Medicare Other | Admitting: Podiatry

## 2016-10-24 DIAGNOSIS — M79674 Pain in right toe(s): Secondary | ICD-10-CM | POA: Diagnosis not present

## 2016-10-24 DIAGNOSIS — B351 Tinea unguium: Secondary | ICD-10-CM

## 2016-10-24 DIAGNOSIS — M79675 Pain in left toe(s): Secondary | ICD-10-CM

## 2016-10-24 NOTE — Progress Notes (Signed)
Patient ID: Lauren Wood, female   DOB: 01-25-1961, 56 y.o.   MRN: [id number]   Subjective: This patient presents for scheduled visit complaining of painful toenails walking wearing shoes requesting nail debridement  Objective: Patient appears to be orientated and does respond to questioning DP pulses 2/4 bilaterally PT pulses 1/4 bilaterally Capillary reflex immediate bilaterally Sensation to 10 g monofilament wire intact 4/5 right and 5/5 left Vibratory sensation reactive bilaterally Ankle reflex equal and reactive bilaterally Pes planus bilaterally Manual motor testing dorsi flexion, plantar flexion, inversion, eversion 5/5 bilaterally Patient walks slowly with cane No open skin lesions bilaterally The toenails are elongated, incurvated, discolored, hypertrophic and tender to direct palpation 6-10  Assessment: Symptomatic onychomycoses 6-10 Decrease pedal pulses Mild peripheral neuropathy  Plan: Debridement toenails 10 mechanically and electrically without any bleeding  Reappoint 3 months

## 2016-10-25 ENCOUNTER — Other Ambulatory Visit: Payer: Self-pay | Admitting: Obstetrics and Gynecology

## 2016-10-25 NOTE — Telephone Encounter (Signed)
Sister Lauren Wood is aware that script is ready for pick up and will come by on Monday when she gets back from out of town.  Lauren Wood,CMA

## 2016-11-21 NOTE — Progress Notes (Signed)
   [facility] Phone: [phone]   Date of Visit: 11/23/2016   HPI:  Patient with her sister and guardian Ms. Cherith Tewell for medication refill. Sister reports of no concerns.   HTN: - Medications: Coreg 25 mg BID, Losartan 100mg  daily, Spironolactone 25mg  daily - compliant with medications  - she denies chest pain, shortness of breath, HA, blurred vision    Morbid Obesity:  - discussed with patient's sister about weight gain. Weight today 365.2lb from 354 in April 2018.  - sister reports that patient has been eating a lot of junk food brought to the house by other family members - she does go to a day center where she gets some physical activity.  - no polyuria or polydipsia  Social History: lives with sister who is her guardian, sister's son (41) and his family. Walks with a cane. Independent of other ADLs. Dependent of IADLs.   ROS: See HPI.  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 Morbid Obesity Urinary Incontinence Restless Leg Syndrome Gout  PHYSICAL EXAM: BP (!) 144/90   Pulse 71   Temp 98.7 F (37.1 C) (Oral)   Ht 5\' 4"  (1.626 m)   Wt (!) 365 lb 3.2 oz (165.7 kg)   LMP 05/08/2013   SpO2 96%   BMI 62.69 kg/m  GEN: NAD, obese CV: RRR, no murmurs, rubs, or gallops PULM: CTAB, normal effort ABD: Soft, nontender, nondistended, NABS, no organomegaly SKIN: No rash or cyanosis; warm and well-perfused EXTR: No lower extremity edema or calf tenderness PSYCH: Mood and affect euthymic, normal rate and volume of speech NEURO: Awake, alert, no focal deficits grossly, hard of hearing, normal speech   ASSESSMENT/PLAN:  Essential hypertension, benign BP is overall at goal. Continue current medication. Discussed the importance of healthy diet and physical activity. Follow up in 3 months. At that time we  will repeat CMP.    Morbid obesity ([place]) Discussed with patient's sister the importance of cutting down fast food and starting healthy diet as well as some physical exercise. Her sister is familiar with My[employer] method. Offered nutrition referral but sister would like to try to make changes her self at first. Follow up in 3 months.    Smiley Houseman, MD PGY [place]

## 2016-11-22 DIAGNOSIS — K5730 Diverticulosis of large intestine without perforation or abscess without bleeding: Secondary | ICD-10-CM | POA: Insufficient documentation

## 2016-11-23 ENCOUNTER — Encounter: Payer: Self-pay | Admitting: Internal Medicine

## 2016-11-23 ENCOUNTER — Ambulatory Visit (INDEPENDENT_AMBULATORY_CARE_PROVIDER_SITE_OTHER): Payer: Medicare Other | Admitting: Internal Medicine

## 2016-11-23 VITALS — BP 144/90 | HR 71 | Temp 98.7°F | Ht 64.0 in | Wt 365.2 lb

## 2016-11-23 DIAGNOSIS — I10 Essential (primary) hypertension: Secondary | ICD-10-CM

## 2016-11-23 MED ORDER — PREGABALIN 50 MG PO CAPS: ORAL_CAPSULE | ORAL | 0 refills | Status: DC

## 2016-11-23 MED ORDER — CARVEDILOL 25 MG PO TABS: ORAL_TABLET | ORAL | 3 refills | Status: DC

## 2016-11-23 MED ORDER — ASPIRIN 81 MG PO TBEC: 81.0000 mg | DELAYED_RELEASE_TABLET | Freq: Every day | ORAL | 11 refills | Status: DC

## 2016-11-23 MED ORDER — OMEPRAZOLE 20 MG PO CPDR: 20.0000 mg | DELAYED_RELEASE_CAPSULE | Freq: Every day | ORAL | 2 refills | Status: DC

## 2016-11-23 MED ORDER — DOCUSATE SODIUM 100 MG PO CAPS: 100.0000 mg | ORAL_CAPSULE | Freq: Two times a day (BID) | ORAL | 1 refills | Status: DC | PRN

## 2016-11-23 MED ORDER — SPIRONOLACTONE 25 MG PO TABS: ORAL_TABLET | ORAL | 3 refills | Status: DC

## 2016-11-23 MED ORDER — LOSARTAN POTASSIUM 100 MG PO TABS: ORAL_TABLET | ORAL | 2 refills | Status: DC

## 2016-11-23 NOTE — Assessment & Plan Note (Addendum)
BP is overall at goal. Continue current medication. Discussed the importance of healthy diet and physical activity. Follow up in 3 months. At that time we will repeat CMP.

## 2016-11-23 NOTE — Patient Instructions (Signed)
Please follow up in 3 months   [email or web address].gov

## 2016-11-23 NOTE — Assessment & Plan Note (Signed)
Discussed with patient's sister the importance of cutting down fast food and starting healthy diet as well as some physical exercise. Her sister is familiar with MyPlate method. Offered nutrition referral but sister would like to try to make changes her self at first. Follow up in 3 months.

## 2016-12-27 ENCOUNTER — Other Ambulatory Visit: Payer: Self-pay | Admitting: *Deleted

## 2016-12-27 MED ORDER — PREGABALIN 50 MG PO CAPS: ORAL_CAPSULE | ORAL | 0 refills | Status: DC

## 2016-12-27 NOTE — Telephone Encounter (Signed)
LM for sister Lauren Wood that script is ready for pick up. Lauren Wood,CMA

## 2016-12-27 NOTE — Telephone Encounter (Signed)
Please inform patient's caretaker that Rx for Lyrica is ready for pick up. I received a refill request, and I am unable to send this electronically.

## 2017-01-18 ENCOUNTER — Ambulatory Visit (INDEPENDENT_AMBULATORY_CARE_PROVIDER_SITE_OTHER): Payer: Medicare Other | Admitting: Internal Medicine

## 2017-01-18 ENCOUNTER — Other Ambulatory Visit: Payer: Self-pay | Admitting: Internal Medicine

## 2017-01-18 ENCOUNTER — Encounter: Payer: Self-pay | Admitting: Internal Medicine

## 2017-01-18 DIAGNOSIS — Z23 Encounter for immunization: Secondary | ICD-10-CM | POA: Diagnosis not present

## 2017-01-18 NOTE — Progress Notes (Signed)
Nutrition Visit   Primary Concerns: Eating right and weight loss.  Barriers to learning/adherence to lifestyle change: MR, sister prepares all meals for large family and works full time, limited mobility  Day program during the day from 9a-3p. Sometimes takes lunch. Program does not provide any meals.    Usual eating pattern includes 1 meals and 1-2 snacks per day. Frequent foods and beverages include flavored water, vegetables, fried foods, microwave popcorn with light butter, peanut butter trail mix.   Typical Exercise Pattern minimal walking at day program and at home.   24-hr recall: (Up at  6 AM) Breakfast ( AM)- N/A   Snack ( AM)-  N/A  Lunch ( 12 PM)- Popcorn, sugar free cookie, kool aid  Snack ( PM)- N/A  Dinner (8-8:30 PM)-  Leg and thigh of [employer] fried chicken, 2 of light sliced sandwich bread, 2 cups of Berrios rice  Snack ( PM)-  N/A  Typical day? Yes.       Goals:  1. Eat 3 meals per day  2. Half of lunch and dinner should be vegetables   See AVS for meal ideas discussed.  Meal plan document reviewed and provided.   Phill Myron, D.O. 01/18/2017, 10:24 AM PGY-3, [place]

## 2017-01-18 NOTE — Progress Notes (Signed)
Spoke with pharmacist regarding faxed Lyrica refill request. The fax was older. Patient should have medication

## 2017-01-18 NOTE — Patient Instructions (Addendum)
Eat at least 3 REAL meals and 1-2 snacks per day.  Aim for no more than 5 hours between eating.  Eat breakfast within one hour of getting up.  A REAL meal includes at least some protein, some starch, and vegetables and/or fruit.    Breakfast Ideas:  1. Regular cheerio's with milk  2. Yogurt with or without fresh fruit  3. Eggs  4. Turkey/ham/chicken sandwich with a piece of fruit   Lunch/Dinner Ideas:  1. Chicken and vegetable soup (if you ever use canned soup, first microwave vegetables then add soup and reheat it) with piece of bread or serving of crackers  2. Turkey/ham/chicken sandwich with a piece of fruit or vegetables  3. Salad with protein on top with side of bread or crackers

## 2017-01-23 ENCOUNTER — Encounter (INDEPENDENT_AMBULATORY_CARE_PROVIDER_SITE_OTHER): Payer: Medicare Other | Admitting: Podiatry

## 2017-01-23 NOTE — Progress Notes (Signed)
This encounter was created in error - please disregard.

## 2017-02-25 ENCOUNTER — Other Ambulatory Visit: Payer: Self-pay | Admitting: Internal Medicine

## 2017-02-26 ENCOUNTER — Telehealth: Payer: Self-pay | Admitting: *Deleted

## 2017-02-26 NOTE — Telephone Encounter (Signed)
Attempted to call phone number in chart. Initially answered but then cut out. Called again, but continued to ring without answer. Will try again tomorrow.   I wanted to inform that it looks like prior authorization for lyrica will not be approved because patient has not tried Gabapentin.

## 2017-02-26 NOTE — Telephone Encounter (Signed)
Prior Authorization received from [employer] for Lyrica 50 mg.  PA form placed in provider box for completion. Derl Barrow, RN

## 2017-02-28 NOTE — Telephone Encounter (Signed)
Attempted to call caregiver. A "guest" answered the phone who took the clinic number so that caregiver can call back.

## 2017-03-07 MED ORDER — PREGABALIN 50 MG PO CAPS: ORAL_CAPSULE | ORAL | 0 refills | Status: DC

## 2017-03-07 NOTE — Addendum Note (Signed)
Addended by: Smiley Houseman on: 03/07/2017 11:37 AM   Modules accepted: Orders

## 2017-03-07 NOTE — Telephone Encounter (Signed)
Spoke with pharmacy regarding prior auth and other fax requesting refill of Lyrica. Pharmacist reports it has been going trough for the patient. Therefore will send in another refill. Rx printed and placed in to fax box.

## 2017-03-08 ENCOUNTER — Ambulatory Visit: Payer: Medicare Other | Admitting: Family Medicine

## 2017-04-16 ENCOUNTER — Telehealth: Payer: Self-pay | Admitting: *Deleted

## 2017-04-16 NOTE — Telephone Encounter (Signed)
Sister called in asking if it was ok for pt to take ibuprofen following tooth extraction. Instructed to start with tylenol but if that doesn't help the pain may try ibuprofen as there is no known allergy to this. Hubbard Hartshorn, RN, BSN

## 2017-04-16 NOTE — Telephone Encounter (Signed)
Agree and noted

## 2017-04-27 ENCOUNTER — Emergency Department (HOSPITAL_COMMUNITY)
Admission: EM | Admit: 2017-04-27 | Discharge: 2017-04-28 | Disposition: A | Discharge status: Home / Self Care | Payer: Medicare Other | Attending: Emergency Medicine | Admitting: Emergency Medicine

## 2017-04-27 ENCOUNTER — Other Ambulatory Visit: Payer: Self-pay

## 2017-04-27 ENCOUNTER — Emergency Department (HOSPITAL_COMMUNITY): Payer: Medicare Other

## 2017-04-27 ENCOUNTER — Encounter (HOSPITAL_COMMUNITY): Payer: Self-pay | Admitting: Emergency Medicine

## 2017-04-27 DIAGNOSIS — M79672 Pain in left foot: Secondary | ICD-10-CM | POA: Diagnosis not present

## 2017-04-27 DIAGNOSIS — Z7982 Long term (current) use of aspirin: Secondary | ICD-10-CM | POA: Insufficient documentation

## 2017-04-27 DIAGNOSIS — I130 Hypertensive heart and chronic kidney disease with heart failure and stage 1 through stage 4 chronic kidney disease, or unspecified chronic kidney disease: Secondary | ICD-10-CM | POA: Diagnosis not present

## 2017-04-27 DIAGNOSIS — I5032 Chronic diastolic (congestive) heart failure: Secondary | ICD-10-CM | POA: Diagnosis not present

## 2017-04-27 DIAGNOSIS — N183 Chronic kidney disease, stage 3 (moderate): Secondary | ICD-10-CM | POA: Insufficient documentation

## 2017-04-27 DIAGNOSIS — Z79899 Other long term (current) drug therapy: Secondary | ICD-10-CM | POA: Diagnosis not present

## 2017-04-27 MED ORDER — TRAMADOL HCL 50 MG PO TABS
50.0000 mg | ORAL_TABLET | Freq: Once | ORAL | Status: AC
  Administered 2017-04-28: 50 mg via ORAL
  Filled 2017-04-27: qty 1

## 2017-04-27 NOTE — ED Triage Notes (Signed)
PT c/o left foot pain since Thursday. Denies injury/fall.

## 2017-04-28 ENCOUNTER — Ambulatory Visit (HOSPITAL_BASED_OUTPATIENT_CLINIC_OR_DEPARTMENT_OTHER)
Admission: RE | Admit: 2017-04-28 | Discharge: 2017-04-28 | Disposition: A | Discharge status: Home / Self Care | Payer: Medicare Other | Source: Ambulatory Visit | Attending: Emergency Medicine | Admitting: Emergency Medicine

## 2017-04-28 DIAGNOSIS — M79672 Pain in left foot: Secondary | ICD-10-CM | POA: Insufficient documentation

## 2017-04-28 DIAGNOSIS — M79609 Pain in unspecified limb: Secondary | ICD-10-CM

## 2017-04-28 DIAGNOSIS — M79605 Pain in left leg: Secondary | ICD-10-CM

## 2017-04-28 NOTE — ED Notes (Signed)
Pt departed in NAD.  

## 2017-04-28 NOTE — ED Provider Notes (Signed)
[name] EMERGENCY DEPARTMENT Provider Note   CSN: [id number] Arrival date & time: 04/27/17  2105     History   Chief Complaint Chief Complaint  Patient presents with  . Foot Pain    HPI Lauren Wood is a 56 y.o. female.  HPI 56 year old African-American female past medical history significant for hypertension, mental retardation, gout presents to the emergency department today for evaluation of left foot pain.  The patient states that the pain started yesterday.  States the pain is worse in the bottom of her foot.  States that is worse with ambulation when she first wakes up in the morning.  She has been ambulatory but with some pain.  She has not taken anything for the pain prior to arrival.  Patient states the pain shoots up her leg.  Denies any associated swelling.  Patient denies any chest pain or shortness of breath.  Denies any associated fevers, chills, nausea, vomiting.  Nothing makes her symptoms better.  Denies any associated paresthesias or weakness. Past Medical History:  Diagnosis Date  . ALLERGIC RHINITIS 08/02/2009  . Arthritis   . Boil of vulva 11/07/2012  . Borderline personality disorder ([place]) 05/31/2011  . Gout of foot 09/26/2013   L foot 09/2013. In the setting of HCTZ 25 mg and lasix 40 mg daily. Stopped HCTZ after decreasing to 12.5 mg. Treated with colchicine.    [place] Kitchen Heart failure (HCC)    chronic  . Heart murmur   . Hip pain 12/03/2012  . HTN (hypertension)   . Kidney disease    chronic  . 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 Obesity   . 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 PRESSURE ULCER OTHER SITE 10/01/2009   hx of  . Sleep apnea    uses cpap, setting of 2  . Sleep apnea   . Unspecified psychosis 12/20/2009    Patient Active Problem List   Diagnosis Date Noted  .  Diverticulosis of colon 11/22/2016  . Restless leg syndrome 09/05/2016  . Urinary incontinence 12/24/2014  . Gout of foot 09/26/2013  . Onychomycosis 09/25/2013  . Personal history of colonic polyps 06/24/2013  . DOE (dyspnea on exertion) 05/02/2013  . Health care maintenance 02/11/2013  . Hearing loss in left ear 02/11/2013  . OSA (obstructive sleep apnea) 12/11/2012  . Borderline personality disorder ([place]) 05/31/2011  . CKD (chronic kidney disease) stage 3, GFR 30-59 ml/min (HCC) 02/06/2011  . CHF with left ventricular diastolic dysfunction, NYHA class 1 ([place]) 02/06/2011  . Psychosis ([place]) 12/20/2009  . Morbid obesity ([place]) 08/02/2009  . MENTAL RETARDATION 08/02/2009  . Essential hypertension, benign 08/02/2009  . GERD 08/02/2009  . OSTEOARTHRITIS, MULTIPLE JOINTS 08/02/2009  . SLEEP DISORDER 08/02/2009    Past Surgical History:  Procedure Laterality Date  . COLONOSCOPY N/A 06/24/2013   Procedure: COLONOSCOPY;  Surgeon: Gatha Mayer, MD;  Location: [facility] ENDOSCOPY;  Service: Endoscopy;  Laterality: N/A;  . COLONOSCOPY WITH PROPOFOL N/A 10/05/2015   Procedure: COLONOSCOPY WITH PROPOFOL;  Surgeon: Gatha Mayer, MD;  Location: [facility] ENDOSCOPY;  Service: Endoscopy;  Laterality: N/A;  . cyst removed  2011   forehead  . TYMPANOSTOMY TUBE PLACEMENT Bilateral yrs ago    OB History    No data available  Home Medications    Prior to Admission medications   Medication Sig Start Date End Date Taking? Authorizing Provider  aspirin (QC LO-DOSE ASPIRIN) 81 MG EC tablet Take 1 tablet (81 mg total) by mouth daily. Swallow whole. 11/23/16   Smiley Houseman, MD  benztropine (COGENTIN) 0.5 MG tablet Take 0.25 mg by mouth 2 (two) times daily.    [provider]  buPROPion (WELLBUTRIN XL) 150 MG 24 hr tablet Take 150 mg by mouth every morning.    [provider]  carvedilol (COREG) 25 MG tablet TAKE ONE TABLET TWICE DAILY WITH A MEAL 11/23/16   Smiley Houseman, MD   clonazePAM (KLONOPIN) 0.5 MG tablet Take 0.25 mg by mouth 2 (two) times daily as needed (For anxiety or sleep.).  04/11/13   [provider]  DOK 100 MG capsule TAKE ONE CAPSULE TWICE DAILY AS NEEDED FOR MILD OR MODERATE CONSTIPATION 02/26/17   Smiley Houseman, MD  FLUoxetine (PROZAC) 40 MG capsule Take 40 mg by mouth every morning.    [provider]  losartan (COZAAR) 100 MG tablet TAKE ONE TABLET EACH DAY 11/23/16   Smiley Houseman, MD  mirtazapine (REMERON) 30 MG tablet Take 30 mg by mouth at bedtime.  04/11/13   [provider]  omeprazole (PRILOSEC) 20 MG capsule Take 1 capsule (20 mg total) by mouth daily. 11/23/16   Smiley Houseman, MD  prazosin (MINIPRESS) 1 MG capsule Take 1 mg by mouth daily.  11/03/14   [provider]  pregabalin (LYRICA) 50 MG capsule TAKE 1 CAPSULE 1 TO 3 HOURS BEFORE BEDTIME 03/07/17   Smiley Houseman, MD  risperiDONE (RISPERDAL) 3 MG tablet Take 3 mg by mouth 2 (two) times daily.  04/26/13   [provider]  spironolactone (ALDACTONE) 25 MG tablet TAKE ONE TABLET EACH DAY 11/23/16   Smiley Houseman, MD  traZODone (DESYREL) 100 MG tablet Take 100 mg by mouth at bedtime.  07/14/12   [provider]    Family History Family History  Problem Relation Age of Onset  . Colon cancer Father        around age 69  . Diabetes Mother   . Esophageal cancer Neg Hx   . Pancreatic cancer Neg Hx   . Stomach cancer Neg Hx   . Liver disease Neg Hx     Social History Social History   Tobacco Use  . Smoking status: Never Smoker  . Smokeless tobacco: Never Used  Substance Use Topics  . Alcohol use: No    Alcohol/week: 0.0 oz  . Drug use: No     Allergies   Enalapril maleate; Hctz [hydrochlorothiazide]; and Lasix [furosemide]   Review of Systems Review of Systems  Constitutional: Negative for chills and fever.  Respiratory: Negative for shortness of breath.   Cardiovascular: Negative for  chest pain.  Musculoskeletal: Positive for arthralgias, gait problem and myalgias. Negative for neck pain.  Skin: Negative for color change.  Neurological: Negative for weakness and numbness.     Physical Exam Updated Vital Signs BP (!) 166/98 (BP Location: Right Arm)   Pulse 88   Temp 98.8 F (37.1 C) (Oral)   Resp 16   LMP 05/08/2013   SpO2 98%   Physical Exam  Constitutional: She appears well-developed and well-nourished. No distress.  HENT:  Head: Normocephalic and atraumatic.  Eyes: Right eye exhibits no discharge. Left eye exhibits no discharge. No scleral icterus.  Neck: Normal range of motion.  Pulmonary/Chest: No respiratory distress.  Musculoskeletal: Normal range of motion.  Patient complains of pain with palpation of the plantar aspect of the left foot.  She does have pain of palpation of the plantar fascia while hyperextending the left greater toe.  There is no obvious edema, erythema, warmth noted.  Patient is morbid obese.  I do not appreciate any pitting edema in the lower extremities.  His lower extremities appear equal bilaterally.  There is no obvious erythema or calf tenderness.  Full range of motion.  Brisk cap refill.  DP pulses are 2+ bilaterally.  Sensation intact.  Neurological: She is alert.  Skin: Skin is warm and dry. Capillary refill takes less than 2 seconds. No pallor.  Psychiatric: Her behavior is normal. Judgment and thought content normal.  Nursing note and vitals reviewed.    ED Treatments / Results  Labs (all labs ordered are listed, but only abnormal results are displayed) Labs Reviewed - No data to display  EKG  EKG Interpretation None       Radiology Dg Foot Complete Left  Result Date: 04/27/2017 CLINICAL DATA:  Acute onset of left foot pain and swelling. EXAM: LEFT FOOT - COMPLETE 3+ VIEW COMPARISON:  Left foot radiographs performed 06/10/2008 FINDINGS: There is no evidence of fracture or dislocation. The joint spaces are  preserved. There is no evidence of talar subluxation; the subtalar joint is unremarkable in appearance. Plantar and posterior calcaneal spurs are noted. Soft tissue swelling is noted about the ankle, and along the dorsum of the forefoot. IMPRESSION: No evidence of fracture or dislocation. Electronically Signed   By: Garald Balding M.D.   On: 04/27/2017 22:40    Procedures Procedures (including critical care time)  Medications Ordered in ED Medications  traMADol (ULTRAM) tablet 50 mg (50 mg Oral Given 04/28/17 0033)     Initial Impression / Assessment and Plan / ED Course  I have reviewed the triage vital signs and the nursing notes.  Pertinent labs & imaging results that were available during my care of the patient were reviewed by me and considered in my medical decision making (see chart for details).     Patient presents to the ED with complaints of left foot pain acute onset yesterday.  Clinical presentation seems consistent with possible plantar fasciitis.  Patient has no associated erythema, warmth, edema that would be consistent with cellulitis, septic arthritis.  Patient states the pain does shoot up her leg.  I do not appreciate any significant lower extremity edema however patient is morbid obese.  This could be contributing to her left foot pain.  I have very low suspicion for DVT however will order an outpatient ultrasound to evaluate in the a.m. and patient is agreeable to this plan.  Patient denies any associated shortness of breath or chest pain that needs to be worked up for PE at this time.  Patient heads neurovascularly intact.  No signs of arterial occlusion.  X-ray was performed that reveals no acute abnormalities.  Have discussed symptomatic treatment at home.  Encouraged NSAIDs, Tylenol and rice therapy.  Have given podiatry follow-up.  Have discussed very strict return precautions including any worsening pain, redness, chest pain or shortness of breath to return to the ED  for evaluation.  Pt is hemodynamically stable, in NAD, & able to ambulate in the ED. Evaluation does not show pathology that would require ongoing emergent intervention or inpatient treatment. I explained the diagnosis to the patient. Pain has been managed & has no  complaints prior to dc. Pt is comfortable with above plan and is stable for discharge at this time. All questions were answered prior to disposition. Strict return precautions for f/u to the ED were discussed. Encouraged follow up with PCP.  Pt dicussed with Dr. Wyvonnia Dusky who is agreeable with the above plan.  Final Clinical Impressions(s) / ED Diagnoses   Final diagnoses:  Left foot pain    ED Discharge Orders        Ordered    LE VENOUS     04/28/17 0052       Doristine Devoid, PA-C 04/28/17 0118    Doristine Devoid, PA-C 04/28/17 0118    Ezequiel Essex, MD 04/28/17 [phone]

## 2017-04-28 NOTE — Discharge Instructions (Signed)
Your symptoms seem consistent with plantar fasciitis.  Have given you information for some rehab for this.  Encouraged warm soaks and Epsom salt.  Encouraged Motrin and Tylenol around-the-clock at home.  I have also scheduled for an outpatient ultrasound tomorrow to rule out any blood clots in your leg however I have low suspicion for this.  IMPORTANT PATIENT INSTRUCTIONS:  You have been scheduled for an Outpatient Vascular Study at [facility].    If tomorrow is a Saturday, Sunday or holiday, please go to the [facility] Emergency Department Registration Desk at 8 am tomorrow morning and tell them you are there for a vascular study.  If tomorrow is a weekday (Monday-Friday), please go to [facility] Admitting Department at 8 am and tell them  you are  there for a vascular study.  Return to the ED if you develop any redness, worsening pain, swelling, chest pain or shortness of breath.

## 2017-04-28 NOTE — Progress Notes (Signed)
LLE venous duplex prelim: no obvious evidence of DVT. Landry Mellow, RDMS, RVT

## 2017-06-04 ENCOUNTER — Ambulatory Visit: Payer: Medicare Other | Admitting: Podiatry

## 2017-06-07 ENCOUNTER — Ambulatory Visit: Payer: Medicare Other | Admitting: Podiatry

## 2017-07-10 NOTE — Progress Notes (Signed)
   [facility] Phone: [phone]   Date of Visit: 07/12/2017   HPI: Patient is present with her caretaker.   HTN:  - Medication: Coreg 25mg  BID, Losartan 100mg  daily, Spironolactone 25mg  daily  - compliant with medications - denies lightheadedness or dizziness - denies chest pain, shortness of breath, or lower extremity swelling - does not check her blood pressure at home   Her psych medications are managed by psychiatry who she sees regularly    Scat Forms:  - caregiver request that we complete scat forms to request a bus instead of car as patient has a difficult time getting in and out of the small cars.   ROS: See HPI.  [place]:  PMH: HTN CHF CKD3 OSA GERD Colon Diverticulosis OA Cognitive Disability    PHYSICAL EXAM: BP 100/80   Pulse 76   Temp 98.4 F (36.9 C) (Oral)   Wt (!) 357 lb (161.9 kg)   LMP 05/08/2013   SpO2 95%   BMI 61.28 kg/m  GEN: NAD CV: RRR, no murmurs, rubs, or gallops PULM: CTAB, normal effort ABD: Soft, nontender, nondistended, NABS, no organomegaly SKIN: No rash or cyanosis; warm and well-perfused EXTR: No lower extremity edema or calf tenderness PSYCH: Mood and affect euthymic, normal rate and volume of speech NEURO: Awake, alert, no focal deficits grossly, normal speech  ASSESSMENT/PLAN:  Health maintenance:  -mammogram - shingles vaccine sent to pharmacy  - needs a follow up visit for pap smear   Essential hypertension, benign Patient's blood pressure is too well controlled. Will cut back on Losartan from 100mg  to 50mg  daily. Continue Coreg and Spironolactone as is.   CKD (chronic kidney disease) stage 3, GFR 30-59 ml/min We will get CMP today to check cr and LFTs  Morbid obesity ([place]) Discussed the importance of diet and physical activity to improve weight. Lipid panel today    Smiley Houseman, MD PGY [place]

## 2017-07-12 ENCOUNTER — Encounter: Payer: Self-pay | Admitting: Internal Medicine

## 2017-07-12 ENCOUNTER — Other Ambulatory Visit: Payer: Self-pay

## 2017-07-12 ENCOUNTER — Ambulatory Visit (INDEPENDENT_AMBULATORY_CARE_PROVIDER_SITE_OTHER): Payer: Medicare Other | Admitting: Internal Medicine

## 2017-07-12 VITALS — BP 100/80 | HR 76 | Temp 98.4°F | Wt 357.0 lb

## 2017-07-12 DIAGNOSIS — N183 Chronic kidney disease, stage 3 unspecified: Secondary | ICD-10-CM

## 2017-07-12 DIAGNOSIS — Z1231 Encounter for screening mammogram for malignant neoplasm of breast: Secondary | ICD-10-CM | POA: Diagnosis not present

## 2017-07-12 DIAGNOSIS — E7800 Pure hypercholesterolemia, unspecified: Secondary | ICD-10-CM

## 2017-07-12 DIAGNOSIS — I10 Essential (primary) hypertension: Secondary | ICD-10-CM | POA: Diagnosis not present

## 2017-07-12 DIAGNOSIS — Z1239 Encounter for other screening for malignant neoplasm of breast: Secondary | ICD-10-CM

## 2017-07-12 MED ORDER — ZOSTER VAC RECOMB ADJUVANTED 50 MCG/0.5ML IM SUSR: 0.5000 mL | Freq: Once | INTRAMUSCULAR | 1 refills | Status: DC

## 2017-07-12 MED ORDER — ZOSTER VAC RECOMB ADJUVANTED 50 MCG/0.5ML IM SUSR: 0.5000 mL | Freq: Once | INTRAMUSCULAR | 1 refills | Status: AC

## 2017-07-12 MED ORDER — LOSARTAN POTASSIUM 50 MG PO TABS: ORAL_TABLET | ORAL | 2 refills | Status: DC

## 2017-07-12 NOTE — Patient Instructions (Addendum)
We sent a prescription for shingles vaccine to the pharmacy We will get some blood work today  Make an appointment for your mammogram   Please make an appointment for your pap smear We decreased the Losartan from 100mg  to 50mg  daily.  Please monitor blood pressure.

## 2017-07-13 ENCOUNTER — Encounter: Payer: Self-pay | Admitting: Internal Medicine

## 2017-07-13 LAB — CMP14+EGFR
ALT: 7 IU/L (ref 0–32)
AST: 14 IU/L (ref 0–40)
Albumin/Globulin Ratio: 1.3 (ref 1.2–2.2)
Albumin: 4 g/dL (ref 3.5–5.5)
Alkaline Phosphatase: 155 IU/L — ABNORMAL HIGH (ref 39–117)
BUN/Creatinine Ratio: 10 (ref 9–23)
BUN: 12 mg/dL (ref 6–24)
Bilirubin Total: 0.3 mg/dL (ref 0.0–1.2)
CO2: 26 mmol/L (ref 20–29)
Calcium: 9.7 mg/dL (ref 8.7–10.2)
Chloride: 102 mmol/L (ref 96–106)
Creatinine, Ser: 1.19 mg/dL — ABNORMAL HIGH (ref 0.57–1.00)
GFR calc Af Amer: 59 mL/min/{1.73_m2} — ABNORMAL LOW (ref >59)
GFR calc non Af Amer: 51 mL/min/{1.73_m2} — ABNORMAL LOW (ref >59)
Globulin, Total: 3.2 g/dL (ref 1.5–4.5)
Glucose: 78 mg/dL (ref 65–99)
Potassium: 4.4 mmol/L (ref 3.5–5.2)
Sodium: 143 mmol/L (ref 134–144)
Total Protein: 7.2 g/dL (ref 6.0–8.5)

## 2017-07-13 LAB — LIPID PANEL
Chol/HDL Ratio: 2.8 ratio (ref 0.0–4.4)
Cholesterol, Total: 122 mg/dL (ref 100–199)
HDL: 44 mg/dL (ref >39)
LDL Calculated: 66 mg/dL (ref 0–99)
Triglycerides: 59 mg/dL (ref 0–149)
VLDL Cholesterol Cal: 12 mg/dL (ref 5–40)

## 2017-07-13 NOTE — Assessment & Plan Note (Signed)
Patient's blood pressure is too well controlled. Will cut back on Losartan from 100mg  to 50mg  daily. Continue Coreg and Spironolactone as is.

## 2017-07-13 NOTE — Assessment & Plan Note (Signed)
We will get CMP today to check cr and LFTs

## 2017-07-13 NOTE — Assessment & Plan Note (Addendum)
Discussed the importance of diet and physical activity to improve weight. Lipid panel today

## 2017-08-01 NOTE — Progress Notes (Deleted)
   [facility] Phone: [phone]   Date of Visit: 08/02/2017   HPI:  ***  ROS: See HPI.  [place]:  PMH: HTN, HFpEF CKD3 OSA GERD Diverticulosis of Colon, Colonic Polyps OA Gout Obesity  Cognitive Dysfunction  Borderline Personality DO  PHYSICAL EXAM: LMP 05/08/2013  Gen: *** HEENT: *** Heart: *** Lungs: *** Neuro: *** Ext: ***  ASSESSMENT/PLAN:  Health maintenance:  -***  No problem-specific Assessment & Plan notes found for this encounter.  FOLLOW UP: Follow up in *** for ***  Smiley Houseman, MD PGY [place]

## 2017-08-02 ENCOUNTER — Ambulatory Visit: Payer: Medicare Other | Admitting: Internal Medicine

## 2017-08-29 ENCOUNTER — Other Ambulatory Visit: Payer: Self-pay | Admitting: Internal Medicine

## 2017-09-02 NOTE — Progress Notes (Signed)
   [facility] Phone: [phone]   Date of Visit: 09/03/2017   HPI:  Left Knee Pain:  - pain started last Thursday. No recent injury - pain is mainly on the lateral aspect of the knee with radiation distal to the knee - the knee sometimes feels swollen.  - the pain is worst at night and during ambulation  - no falls - has tried 1 tab of Tylenol without much relief - no fevers/chills  - per chart review, knee xray from 01/2010 with tricompartmental arthritis   ROS: See HPI.  [place]:  PMH: HTN HFpEF OSA GERD Colonic Diverticulosis OA Gout CKD3 Obesity Borderline Personality DO   PHYSICAL EXAM: BP 136/86   Pulse 67   Temp 98.9 F (37.2 C) (Oral)   Wt (!) 365 lb (165.6 kg)   LMP 05/08/2013   SpO2 96%   BMI 62.65 kg/m  GEN: NAD CV: RRR, no murmurs, rubs, or gallops PULM: CTAB, normal effort MSK: Left Knee: Normal to inspection with no erythema or effusion or obvious bony abnormalities. Palpation with no warmth or patellar tenderness or condyle tenderness. Lateral joint line tenderness  ROM normal in flexion and extension and lower leg rotation. Ligaments with solid consistent endpoints including ACL, PCL, LCL, MCL. Non painful patellar compression. Patellar and quadriceps tendons unremarkable. Hamstring and quadriceps strength is normal. SKIN: No rash or cyanosis; warm and well-perfused EXTR: No lower extremity edema or calf tenderness PSYCH: Mood and affect euthymic, normal rate and volume of speech NEURO: Awake, alert, no focal deficits grossly, normal speech   ASSESSMENT/PLAN:  1. Osteoarthritis of left knee, unspecified osteoarthritis type Symptoms most consistent with OA. Will do short course of Mobic 15mg  daily x 4 days then PRN (#10tablets). Will also refer to PT. Discussed weight loss will help with knee pain. If symptoms do not improve, can consider steroid injection.  - Ambulatory referral to Physical Therapy  Smiley Houseman, MD PGY [place]

## 2017-09-03 ENCOUNTER — Encounter: Payer: Self-pay | Admitting: Internal Medicine

## 2017-09-03 ENCOUNTER — Other Ambulatory Visit: Payer: Self-pay

## 2017-09-03 ENCOUNTER — Other Ambulatory Visit: Payer: Self-pay | Admitting: Internal Medicine

## 2017-09-03 ENCOUNTER — Ambulatory Visit (INDEPENDENT_AMBULATORY_CARE_PROVIDER_SITE_OTHER): Payer: Medicare Other | Admitting: Internal Medicine

## 2017-09-03 VITALS — BP 136/86 | HR 67 | Temp 98.9°F | Wt 365.0 lb

## 2017-09-03 DIAGNOSIS — M1712 Unilateral primary osteoarthritis, left knee: Secondary | ICD-10-CM | POA: Diagnosis not present

## 2017-09-03 MED ORDER — MELOXICAM 15 MG PO TABS: 15.0000 mg | ORAL_TABLET | Freq: Every day | ORAL | 0 refills | Status: DC | PRN

## 2017-09-03 NOTE — Assessment & Plan Note (Signed)
Osteoarthritis of left knee, unspecified osteoarthritis type Symptoms most consistent with OA. Will do short course of Mobic 15mg  daily x 4 days then PRN (#10tablets). Will also refer to PT. Discussed weight loss will help with knee pain. If symptoms do not improve, can consider steroid injection.  - Ambulatory referral to Physical Therapy

## 2017-09-03 NOTE — Patient Instructions (Signed)
1) for your knee pain which is likely due to arthritis, lets try a short course of anti-inflammatory, Mobic 15mg  daily for 4 days then daily as needed. I also made a referral to physical therapy.   If your symptoms do not improve after about 4 weeks, we can then talk about a steroid injection.

## 2017-10-15 ENCOUNTER — Other Ambulatory Visit: Payer: Self-pay | Admitting: *Deleted

## 2017-10-15 MED ORDER — PREGABALIN 50 MG PO CAPS: ORAL_CAPSULE | ORAL | 0 refills | Status: DC

## 2017-11-26 DIAGNOSIS — H9212 Otorrhea, left ear: Secondary | ICD-10-CM | POA: Insufficient documentation

## 2017-12-07 DIAGNOSIS — H6993 Unspecified Eustachian tube disorder, bilateral: Secondary | ICD-10-CM | POA: Insufficient documentation

## 2017-12-07 DIAGNOSIS — H6983 Other specified disorders of Eustachian tube, bilateral: Secondary | ICD-10-CM | POA: Insufficient documentation

## 2017-12-21 ENCOUNTER — Encounter: Payer: Self-pay | Admitting: Podiatry

## 2017-12-21 ENCOUNTER — Ambulatory Visit (INDEPENDENT_AMBULATORY_CARE_PROVIDER_SITE_OTHER): Payer: Medicare Other | Admitting: Podiatry

## 2017-12-21 DIAGNOSIS — G609 Hereditary and idiopathic neuropathy, unspecified: Secondary | ICD-10-CM | POA: Diagnosis not present

## 2017-12-21 DIAGNOSIS — M79675 Pain in left toe(s): Secondary | ICD-10-CM

## 2017-12-21 DIAGNOSIS — B351 Tinea unguium: Secondary | ICD-10-CM

## 2017-12-21 DIAGNOSIS — M79674 Pain in right toe(s): Secondary | ICD-10-CM

## 2017-12-23 NOTE — Progress Notes (Signed)
Subjective: Lauren Wood presents today with  cc of painful, discolored, thick toenails which interfere with daily activities and routine tasks.  Pain is aggravated when wearing enclosed shoe gear. Pain is relieved with periodic professional debridement.  Objective: Vascular Examination: Capillary refill time immediate x 10 digits Dorsalis pedis 2/4 b/l Posterior tibial pulses 1/4 b/l No digital hair x 10 digits Skin temperature warm to warm b/l  Dermatological Examination: Skin with normal turgor, texture and tone b/l Toenails 1-5 b/l discolored, thick, dystrophic with subungual debris and pain with palpation to nailbeds due to thickness of nails.  Musculoskeletal: Muscle strength 5/5 b/l dorsiflexion, plantarflexion, eversion and inversion  Neurological: Sensation 4/5 right and 5/5 left with 10 gram monofilament. Vibratory sensation intact.  Assessment: Painful onychomycosis toenails 1-5 b/l  Mild peripheral neuropathy Decreased pedal pulses b/l  Plan: 1. Toenails 1-5 b/l were debrided in length and girth mechanically and electrically without iatrogenic bleeding. 2. Patient to continue soft, supportive shoe gear 3. Patient to report any pedal injuries to medical professional immediately. 4. Follow up 3 months.  5. Patient to call should there be a concern in the interim.

## 2017-12-28 ENCOUNTER — Ambulatory Visit
Admission: RE | Admit: 2017-12-28 | Discharge: 2017-12-28 | Disposition: A | Discharge status: Home / Self Care | Payer: Medicare Other | Source: Ambulatory Visit | Attending: Family Medicine | Admitting: Family Medicine

## 2017-12-28 ENCOUNTER — Ambulatory Visit: Payer: Medicare Other

## 2017-12-28 DIAGNOSIS — Z1239 Encounter for other screening for malignant neoplasm of breast: Secondary | ICD-10-CM

## 2018-01-16 ENCOUNTER — Other Ambulatory Visit: Payer: Self-pay | Admitting: Internal Medicine

## 2018-01-16 NOTE — Telephone Encounter (Signed)
[place] pmp reviewed. No red flags

## 2018-03-22 ENCOUNTER — Ambulatory Visit: Payer: Medicare Other | Admitting: Podiatry

## 2018-04-09 ENCOUNTER — Other Ambulatory Visit: Payer: Self-pay | Admitting: Internal Medicine

## 2018-04-17 ENCOUNTER — Ambulatory Visit: Payer: Medicare Other | Admitting: Podiatry

## 2018-04-18 ENCOUNTER — Other Ambulatory Visit: Payer: Self-pay | Admitting: Internal Medicine

## 2018-04-18 NOTE — Telephone Encounter (Signed)
Needs appt. Can patient please be called to schedule.

## 2018-04-19 NOTE — Telephone Encounter (Signed)
Pt scheduled for 12/23

## 2018-04-30 ENCOUNTER — Other Ambulatory Visit: Payer: Self-pay | Admitting: Internal Medicine

## 2018-04-30 DIAGNOSIS — I10 Essential (primary) hypertension: Secondary | ICD-10-CM

## 2018-05-06 ENCOUNTER — Ambulatory Visit: Payer: Medicare Other | Admitting: Family Medicine

## 2018-05-10 ENCOUNTER — Other Ambulatory Visit: Payer: Self-pay

## 2018-05-10 ENCOUNTER — Encounter (HOSPITAL_COMMUNITY): Payer: Self-pay

## 2018-05-10 ENCOUNTER — Telehealth: Payer: Self-pay | Admitting: Family Medicine

## 2018-05-10 ENCOUNTER — Emergency Department (HOSPITAL_COMMUNITY): Payer: Medicare Other

## 2018-05-10 ENCOUNTER — Inpatient Hospital Stay (HOSPITAL_COMMUNITY)
Admission: EM | Admit: 2018-05-10 | Discharge: 2018-05-15 | DRG: 291 | Disposition: A | Discharge status: Home Health | Payer: Medicare Other | Attending: Family Medicine | Admitting: Family Medicine

## 2018-05-10 DIAGNOSIS — I10 Essential (primary) hypertension: Secondary | ICD-10-CM | POA: Diagnosis not present

## 2018-05-10 DIAGNOSIS — Z791 Long term (current) use of non-steroidal anti-inflammatories (NSAID): Secondary | ICD-10-CM | POA: Diagnosis not present

## 2018-05-10 DIAGNOSIS — I509 Heart failure, unspecified: Secondary | ICD-10-CM | POA: Diagnosis not present

## 2018-05-10 DIAGNOSIS — G4733 Obstructive sleep apnea (adult) (pediatric): Secondary | ICD-10-CM | POA: Diagnosis not present

## 2018-05-10 DIAGNOSIS — Z79899 Other long term (current) drug therapy: Secondary | ICD-10-CM

## 2018-05-10 DIAGNOSIS — Z888 Allergy status to other drugs, medicaments and biological substances status: Secondary | ICD-10-CM

## 2018-05-10 DIAGNOSIS — F603 Borderline personality disorder: Secondary | ICD-10-CM | POA: Diagnosis present

## 2018-05-10 DIAGNOSIS — Z7982 Long term (current) use of aspirin: Secondary | ICD-10-CM | POA: Diagnosis not present

## 2018-05-10 DIAGNOSIS — E876 Hypokalemia: Secondary | ICD-10-CM | POA: Diagnosis present

## 2018-05-10 DIAGNOSIS — F79 Unspecified intellectual disabilities: Secondary | ICD-10-CM | POA: Diagnosis present

## 2018-05-10 DIAGNOSIS — I5033 Acute on chronic diastolic (congestive) heart failure: Secondary | ICD-10-CM | POA: Diagnosis present

## 2018-05-10 DIAGNOSIS — N182 Chronic kidney disease, stage 2 (mild): Secondary | ICD-10-CM | POA: Diagnosis present

## 2018-05-10 DIAGNOSIS — Z23 Encounter for immunization: Secondary | ICD-10-CM

## 2018-05-10 DIAGNOSIS — Z6841 Body Mass Index (BMI) 40.0 and over, adult: Secondary | ICD-10-CM

## 2018-05-10 DIAGNOSIS — I130 Hypertensive heart and chronic kidney disease with heart failure and stage 1 through stage 4 chronic kidney disease, or unspecified chronic kidney disease: Secondary | ICD-10-CM | POA: Diagnosis present

## 2018-05-10 DIAGNOSIS — H9192 Unspecified hearing loss, left ear: Secondary | ICD-10-CM | POA: Diagnosis present

## 2018-05-10 DIAGNOSIS — N183 Chronic kidney disease, stage 3 (moderate): Secondary | ICD-10-CM | POA: Diagnosis not present

## 2018-05-10 DIAGNOSIS — K219 Gastro-esophageal reflux disease without esophagitis: Secondary | ICD-10-CM | POA: Diagnosis present

## 2018-05-10 DIAGNOSIS — E872 Acidosis: Secondary | ICD-10-CM | POA: Diagnosis present

## 2018-05-10 DIAGNOSIS — J9601 Acute respiratory failure with hypoxia: Secondary | ICD-10-CM | POA: Diagnosis present

## 2018-05-10 DIAGNOSIS — Z9989 Dependence on other enabling machines and devices: Secondary | ICD-10-CM | POA: Diagnosis not present

## 2018-05-10 DIAGNOSIS — E662 Morbid (severe) obesity with alveolar hypoventilation: Secondary | ICD-10-CM | POA: Diagnosis present

## 2018-05-10 DIAGNOSIS — I493 Ventricular premature depolarization: Secondary | ICD-10-CM | POA: Diagnosis not present

## 2018-05-10 DIAGNOSIS — R0602 Shortness of breath: Secondary | ICD-10-CM | POA: Diagnosis not present

## 2018-05-10 HISTORY — DX: Chronic kidney disease, stage 3 unspecified: N18.30

## 2018-05-10 HISTORY — DX: Chronic kidney disease, stage 3 (moderate): N18.3

## 2018-05-10 LAB — CBC WITH DIFFERENTIAL/PLATELET
Abs Immature Granulocytes: 0.04 10*3/uL (ref 0.00–0.07)
Basophils Absolute: 0 10*3/uL (ref 0.0–0.1)
Basophils Relative: 0 %
Eosinophils Absolute: 0.3 10*3/uL (ref 0.0–0.5)
Eosinophils Relative: 3 %
HCT: 38.5 % (ref 36.0–46.0)
Hemoglobin: 11.6 g/dL — ABNORMAL LOW (ref 12.0–15.0)
Immature Granulocytes: 0 %
Lymphocytes Relative: 27 %
Lymphs Abs: 2.8 10*3/uL (ref 0.7–4.0)
MCH: 25.8 pg — ABNORMAL LOW (ref 26.0–34.0)
MCHC: 30.1 g/dL (ref 30.0–36.0)
MCV: 85.7 fL (ref 80.0–100.0)
Monocytes Absolute: 0.6 10*3/uL (ref 0.1–1.0)
Monocytes Relative: 6 %
Neutro Abs: 6.4 10*3/uL (ref 1.7–7.7)
Neutrophils Relative %: 64 %
Platelets: 241 10*3/uL (ref 150–400)
RBC: 4.49 MIL/uL (ref 3.87–5.11)
RDW: 14.7 % (ref 11.5–15.5)
WBC: 10.2 10*3/uL (ref 4.0–10.5)
nRBC: 0 % (ref 0.0–0.2)

## 2018-05-10 LAB — I-STAT TROPONIN, ED: Troponin i, poc: 0.07 ng/mL (ref 0.00–0.08)

## 2018-05-10 LAB — COMPREHENSIVE METABOLIC PANEL
ALT: 14 U/L (ref 0–44)
AST: 22 U/L (ref 15–41)
Albumin: 3 g/dL — ABNORMAL LOW (ref 3.5–5.0)
Alkaline Phosphatase: 102 U/L (ref 38–126)
Anion gap: 7 (ref 5–15)
BUN: 12 mg/dL (ref 6–20)
CO2: 28 mmol/L (ref 22–32)
Calcium: 8.8 mg/dL — ABNORMAL LOW (ref 8.9–10.3)
Chloride: 107 mmol/L (ref 98–111)
Creatinine, Ser: 1.1 mg/dL — ABNORMAL HIGH (ref 0.44–1.00)
GFR calc Af Amer: >60 mL/min (ref >60)
GFR calc non Af Amer: 55 mL/min — ABNORMAL LOW (ref >60)
Glucose, Bld: 90 mg/dL (ref 70–99)
Potassium: 3.2 mmol/L — ABNORMAL LOW (ref 3.5–5.1)
Sodium: 142 mmol/L (ref 135–145)
Total Bilirubin: 0.8 mg/dL (ref 0.3–1.2)
Total Protein: 6.7 g/dL (ref 6.5–8.1)

## 2018-05-10 LAB — BRAIN NATRIURETIC PEPTIDE: B Natriuretic Peptide: 358.6 pg/mL — ABNORMAL HIGH (ref 0.0–100.0)

## 2018-05-10 MED ORDER — MIRTAZAPINE 30 MG PO TABS
30.0000 mg | ORAL_TABLET | Freq: Every day | ORAL | Status: DC
  Administered 2018-05-11 – 2018-05-14 (×5): 30 mg via ORAL
  Filled 2018-05-10: qty 2
  Filled 2018-05-10: qty 1
  Filled 2018-05-10: qty 2
  Filled 2018-05-10 (×2): qty 1
  Filled 2018-05-10: qty 2
  Filled 2018-05-10 (×2): qty 1
  Filled 2018-05-10: qty 2

## 2018-05-10 MED ORDER — SODIUM CHLORIDE 0.9% FLUSH: 3.0000 mL | INTRAVENOUS | Status: DC | PRN

## 2018-05-10 MED ORDER — SODIUM CHLORIDE 0.9% FLUSH
3.0000 mL | Freq: Two times a day (BID) | INTRAVENOUS | Status: DC
  Administered 2018-05-11 – 2018-05-14 (×9): 3 mL via INTRAVENOUS

## 2018-05-10 MED ORDER — DOCUSATE SODIUM 100 MG PO CAPS: 100.0000 mg | ORAL_CAPSULE | Freq: Two times a day (BID) | ORAL | Status: DC | PRN

## 2018-05-10 MED ORDER — FLUOXETINE HCL 20 MG PO CAPS
40.0000 mg | ORAL_CAPSULE | Freq: Every morning | ORAL | Status: DC
  Administered 2018-05-11 – 2018-05-15 (×5): 40 mg via ORAL
  Filled 2018-05-10 (×5): qty 2

## 2018-05-10 MED ORDER — ACETAMINOPHEN 500 MG PO TABS: 500.0000 mg | ORAL_TABLET | Freq: Four times a day (QID) | ORAL | Status: DC | PRN

## 2018-05-10 MED ORDER — BUPROPION HCL ER (XL) 150 MG PO TB24
150.0000 mg | ORAL_TABLET | Freq: Every morning | ORAL | Status: DC
  Administered 2018-05-11 – 2018-05-15 (×5): 150 mg via ORAL
  Filled 2018-05-10 (×5): qty 1

## 2018-05-10 MED ORDER — TRAZODONE HCL 100 MG PO TABS
100.0000 mg | ORAL_TABLET | Freq: Every day | ORAL | Status: DC
  Administered 2018-05-11 – 2018-05-14 (×5): 100 mg via ORAL
  Filled 2018-05-10 (×5): qty 1

## 2018-05-10 MED ORDER — PRAZOSIN HCL 1 MG PO CAPS
1.0000 mg | ORAL_CAPSULE | Freq: Every day | ORAL | Status: DC
  Administered 2018-05-11 – 2018-05-15 (×5): 1 mg via ORAL
  Filled 2018-05-10 (×5): qty 1

## 2018-05-10 MED ORDER — SODIUM CHLORIDE 0.9 % IV SOLN: 250.0000 mL | INTRAVENOUS | Status: DC | PRN

## 2018-05-10 MED ORDER — SPIRONOLACTONE 25 MG PO TABS
25.0000 mg | ORAL_TABLET | Freq: Every day | ORAL | Status: DC
  Administered 2018-05-11 – 2018-05-15 (×5): 25 mg via ORAL
  Filled 2018-05-10 (×5): qty 1

## 2018-05-10 MED ORDER — ALBUTEROL SULFATE (2.5 MG/3ML) 0.083% IN NEBU: 2.5000 mg | INHALATION_SOLUTION | RESPIRATORY_TRACT | Status: DC | PRN

## 2018-05-10 MED ORDER — ENOXAPARIN SODIUM 40 MG/0.4ML ~~LOC~~ SOLN
40.0000 mg | SUBCUTANEOUS | Status: DC
  Administered 2018-05-11 – 2018-05-13 (×3): 40 mg via SUBCUTANEOUS
  Filled 2018-05-10 (×3): qty 0.4

## 2018-05-10 MED ORDER — PANTOPRAZOLE SODIUM 40 MG PO TBEC
40.0000 mg | DELAYED_RELEASE_TABLET | Freq: Every day | ORAL | Status: DC
  Administered 2018-05-11 – 2018-05-15 (×5): 40 mg via ORAL
  Filled 2018-05-10 (×5): qty 1

## 2018-05-10 MED ORDER — FUROSEMIDE 10 MG/ML IJ SOLN
40.0000 mg | Freq: Two times a day (BID) | INTRAMUSCULAR | Status: DC
  Administered 2018-05-11 – 2018-05-12 (×4): 40 mg via INTRAVENOUS
  Filled 2018-05-10 (×4): qty 4

## 2018-05-10 MED ORDER — CLONAZEPAM 0.25 MG PO TBDP: 0.2500 mg | ORAL_TABLET | Freq: Two times a day (BID) | ORAL | Status: DC | PRN

## 2018-05-10 MED ORDER — ASPIRIN EC 81 MG PO TBEC
81.0000 mg | DELAYED_RELEASE_TABLET | Freq: Every day | ORAL | Status: DC
  Administered 2018-05-11 – 2018-05-15 (×5): 81 mg via ORAL
  Filled 2018-05-10 (×5): qty 1

## 2018-05-10 MED ORDER — RISPERIDONE 3 MG PO TABS
3.0000 mg | ORAL_TABLET | Freq: Two times a day (BID) | ORAL | Status: DC
  Administered 2018-05-11 – 2018-05-15 (×10): 3 mg via ORAL
  Filled 2018-05-10 (×10): qty 1

## 2018-05-10 MED ORDER — PREGABALIN 25 MG PO CAPS
50.0000 mg | ORAL_CAPSULE | Freq: Every day | ORAL | Status: DC
  Administered 2018-05-11 – 2018-05-14 (×5): 50 mg via ORAL
  Filled 2018-05-10 (×5): qty 2

## 2018-05-10 MED ORDER — BENZTROPINE MESYLATE 0.5 MG PO TABS
0.2500 mg | ORAL_TABLET | Freq: Two times a day (BID) | ORAL | Status: DC
  Administered 2018-05-11 – 2018-05-15 (×10): 0.25 mg via ORAL
  Filled 2018-05-10 (×10): qty 1

## 2018-05-10 NOTE — Telephone Encounter (Signed)
Pt sister is calling to inform Dr. Shawna Orleans that pt is at the ED due to shortness of breathe.

## 2018-05-10 NOTE — ED Notes (Addendum)
Pt assisted to bedside toilet. O2 Sat drops to 87% with [place] @LPM .

## 2018-05-10 NOTE — ED Notes (Signed)
Pt with destats to 88% when layed flad.  Placed on [facility] and it took 5 min for pt to reach 96% on 2L.  Unable to establish iv line.

## 2018-05-10 NOTE — ED Triage Notes (Signed)
Pt has had increased shortness of breath per her family today. She is more SOB with very little activity. Family reports URI and is concerned she could have developed PNA.

## 2018-05-10 NOTE — ED Provider Notes (Signed)
[facility] EMERGENCY DEPARTMENT Provider Note   CSN: [id number] Arrival date & time: 05/10/18  1418     History   Chief Complaint Chief Complaint  Patient presents with  . Shortness of Breath    HPI ADRIEL DESROSIER is a 57 y.o. female.  The history is provided by the patient, a relative and medical records. No language interpreter was used.  Shortness of Breath    Lauren Wood is a 57 y.o. female who presents to the Emergency Department complaining of sob. Presents to the emergency department accompanied by her sister and power of attorney for evaluation of shortness of breath. History is provided primarily by her sister. Her sister reports two weeks of increased congestion with profound shortness of breath that started today. Her sister describes URI and cold type symptoms for two weeks. The patient now has significant shortness of breath with profound dyspnea on exertion. Her cough is productive of clear mucus. She denies any fevers, chest pain, abdominal pain, nausea, vomiting. She does have edema in her legs, patient states this is old but her sister states this is new. No recent medication changes. Past Medical History:  Diagnosis Date  . ALLERGIC RHINITIS 08/02/2009  . Arthritis   . Boil of vulva 11/07/2012  . Borderline personality disorder ([place]) 05/31/2011  . Gout of foot 09/26/2013   L foot 09/2013. In the setting of HCTZ 25 mg and lasix 40 mg daily. Stopped HCTZ after decreasing to 12.5 mg. Treated with colchicine.    [place] Kitchen Heart failure (HCC)    chronic  . Heart murmur   . Hip pain 12/03/2012  . HTN (hypertension)   . Kidney disease    chronic  . 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 Obesity   . 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 PRESSURE ULCER OTHER SITE 10/01/2009   hx of  . Sleep apnea      uses cpap, setting of 2  . Sleep apnea   . Unspecified psychosis 12/20/2009    Patient Active Problem List   Diagnosis Date Noted  . CHF (congestive heart failure) ([place]) 05/10/2018  . Diverticulosis of colon 11/22/2016  . Restless leg syndrome 09/05/2016  . Urinary incontinence 12/24/2014  . Gout of foot 09/26/2013  . Onychomycosis 09/25/2013  . Personal history of colonic polyps 06/24/2013  . DOE (dyspnea on exertion) 05/02/2013  . Health care maintenance 02/11/2013  . Hearing loss in left ear 02/11/2013  . OSA (obstructive sleep apnea) 12/11/2012  . Borderline personality disorder ([place]) 05/31/2011  . CKD (chronic kidney disease) stage 3, GFR 30-59 ml/min (HCC) 02/06/2011  . CHF with left ventricular diastolic dysfunction, NYHA class 1 ([place]) 02/06/2011  . Psychosis ([place]) 12/20/2009  . Morbid obesity ([place]) 08/02/2009  . MENTAL RETARDATION 08/02/2009  . Essential hypertension, benign 08/02/2009  . GERD 08/02/2009  . OSTEOARTHRITIS, MULTIPLE JOINTS 08/02/2009  . SLEEP DISORDER 08/02/2009    Past Surgical History:  Procedure Laterality Date  . COLONOSCOPY N/A 06/24/2013   Procedure: COLONOSCOPY;  Surgeon: Gatha Mayer, MD;  Location: [facility] ENDOSCOPY;  Service: Endoscopy;  Laterality: N/A;  . COLONOSCOPY WITH PROPOFOL N/A 10/05/2015   Procedure: COLONOSCOPY WITH PROPOFOL;  Surgeon: Gatha Mayer, MD;  Location: [facility] ENDOSCOPY;  Service: Endoscopy;  Laterality: N/A;  . cyst removed  2011   forehead  . TYMPANOSTOMY TUBE PLACEMENT Bilateral yrs ago     OB History   No obstetric history on file.      Home Medications    Prior to Admission medications   Medication Sig Start Date End Date Taking? Authorizing Provider  acetaminophen (TYLENOL) 500 MG tablet Take 500-1,000 mg by mouth every 6 (six) hours as needed (for headaches).   Yes [provider]  aspirin (QC LO-DOSE ASPIRIN) 81 MG EC tablet Take 1 tablet (81 mg total) by mouth daily. Swallow whole. 11/23/16  Yes  Smiley Houseman, MD  benztropine (COGENTIN) 0.5 MG tablet Take 0.25 mg by mouth 2 (two) times daily.   Yes [provider]  buPROPion (WELLBUTRIN XL) 150 MG 24 hr tablet Take 150 mg by mouth every morning.   Yes [provider]  carvedilol (COREG) 25 MG tablet TAKE ONE TABLET TWICE DAILY WITH A MEAL Patient taking differently: Take 25 mg by mouth 2 (two) times daily with a meal.  09/03/17  Yes Smiley Houseman, MD  clonazePAM (KLONOPIN) 0.5 MG tablet Take 0.25 mg by mouth 2 (two) times daily as needed (for anxiety or sleep).  04/11/13  Yes [provider]  DOK 100 MG capsule TAKE ONE CAPSULE TWICE DAILY AS NEEDED FOR MILD OR MODERATE CONSTIPATION Patient taking differently: Take 100 mg by mouth 2 (two) times daily as needed for mild constipation or moderate constipation.  02/26/17  Yes Smiley Houseman, MD  FLUoxetine (PROZAC) 40 MG capsule Take 40 mg by mouth every morning.   Yes [provider]  losartan (COZAAR) 50 MG tablet TAKE ONE TABLET DAILY Patient taking differently: Take 50 mg by mouth daily.  04/30/18  Yes Bufford Lope, DO  meloxicam (MOBIC) 15 MG tablet Take 1 tablet (15 mg total) by mouth daily as needed for pain. Daily for 4-5 days (with food), then daily as needed for knee pain Patient taking differently: Take 15 mg by mouth daily as needed (for knee pain- take with food).  09/03/17  Yes Smiley Houseman, MD  mirtazapine (REMERON) 30 MG tablet Take 30 mg by mouth at bedtime.  04/11/13  Yes [provider]  omeprazole (PRILOSEC) 20 MG capsule TAKE ONE CAPSULE EACH DAY Patient taking differently: Take 20 mg by mouth daily.  04/10/18  Yes Orson Eva J, DO  prazosin (MINIPRESS) 1 MG capsule Take 1 mg by mouth daily.  11/03/14  Yes [provider]  pregabalin (LYRICA) 50 MG capsule TAKE 1 CAPSULE 1 TO 3 HOURS BEFORE BEDTIME Patient taking differently: Take 50 mg by mouth See admin instructions. Take 50 mg by mouth 1-3  hours before bedtime 01/16/18  Yes Orson Eva J, DO  risperiDONE (RISPERDAL) 3 MG tablet Take 3 mg by mouth 2 (two) times daily.  04/26/13  Yes [provider]  spironolactone (ALDACTONE) 25 MG tablet TAKE ONE TABLET EACH DAY Patient taking differently: Take 25 mg by mouth daily.  04/18/18  Yes Bufford Lope, DO  traZODone (DESYREL) 100 MG tablet Take 100 mg by mouth at bedtime.  07/14/12  Yes [provider]    Family History Family History  Problem Relation Age of Onset  . Colon cancer Father        around age 47  . Diabetes Mother   . Esophageal cancer Neg Hx   . Pancreatic cancer Neg Hx   . Stomach cancer Neg Hx   . Liver disease Neg Hx  Social History Social History   Tobacco Use  . Smoking status: Never Smoker  . Smokeless tobacco: Never Used  Substance Use Topics  . Alcohol use: No    Alcohol/week: 0.0 standard drinks  . Drug use: No     Allergies   Enalapril maleate; Hctz [hydrochlorothiazide]; and Lasix [furosemide]   Review of Systems Review of Systems  Respiratory: Positive for shortness of breath.   All other systems reviewed and are negative.    Physical Exam Updated Vital Signs BP (!) 160/85   Pulse (!) 47   Temp 98.4 F (36.9 C) (Oral)   Resp (!) 39   LMP 05/08/2013   SpO2 94%   Physical Exam Vitals signs and nursing note reviewed.  Constitutional:      Appearance: She is well-developed.  HENT:     Head: Normocephalic and atraumatic.  Cardiovascular:     Rate and Rhythm: Normal rate and regular rhythm.     Heart sounds: No murmur.  Pulmonary:     Effort: No respiratory distress.     Comments: Tachypnea with decreased air movement and bilateral bases Abdominal:     Palpations: Abdomen is soft.     Tenderness: There is no abdominal tenderness. There is no guarding or rebound.  Musculoskeletal:        General: No tenderness.     Comments: Non pitting edema to bilateral lower extremities  Skin:    General: Skin is warm  and dry.  Neurological:     Mental Status: She is alert.     Comments: Mildly confused, alert  Psychiatric:        Behavior: Behavior normal.      ED Treatments / Results  Labs (all labs ordered are listed, but only abnormal results are displayed) Labs Reviewed  CBC WITH DIFFERENTIAL/PLATELET - Abnormal; Notable for the following components:      Result Value   Hemoglobin 11.6 (*)    MCH 25.8 (*)    All other components within normal limits  BRAIN NATRIURETIC PEPTIDE - Abnormal; Notable for the following components:   B Natriuretic Peptide 358.6 (*)    All other components within normal limits  COMPREHENSIVE METABOLIC PANEL - Abnormal; Notable for the following components:   Potassium 3.2 (*)    Creatinine, Ser 1.10 (*)    Calcium 8.8 (*)    Albumin 3.0 (*)    GFR calc non Af Amer 55 (*)    All other components within normal limits  HIV ANTIBODY (ROUTINE TESTING W REFLEX)  TSH  LIPID PANEL  TROPONIN I  TROPONIN I  TROPONIN I  HEMOGLOBIN A1C  COMPREHENSIVE METABOLIC PANEL  CBC  CBC  CREATININE, SERUM  I-STAT TROPONIN, ED    EKG EKG Interpretation  Date/Time:  Friday May 10 2018 14:29:12 EST Ventricular Rate:  92 PR Interval:  194 QRS Duration: 146 QT Interval:  396 QTC Calculation: 489 R Axis:   -69 Text Interpretation:  Normal sinus rhythm Possible Left atrial enlargement Left axis deviation Left bundle branch block Abnormal ECG Confirmed by Quintella Reichert [phone]) on 05/10/2018 6:35:42 PM   Radiology Dg Chest 2 View  Result Date: 05/10/2018 CLINICAL DATA:  57 year old female with shortness of breath. EXAM: CHEST - 2 VIEW COMPARISON:  None. FINDINGS: Enlargement of the cardiopericardial silhouette is again noted with pulmonary vascular congestion. There is no evidence of focal airspace disease, pulmonary edema, suspicious pulmonary nodule/mass, definite pleural effusion, or pneumothorax. No acute bony abnormalities are identified. IMPRESSION:  Enlargement  of the cardiopericardial silhouette again noted with pulmonary vascular congestion. Electronically Signed   By: Margarette Canada M.D.   On: 05/10/2018 14:59    Procedures Procedures (including critical care time)  Medications Ordered in ED Medications  furosemide (LASIX) injection 40 mg (has no administration in time range)  acetaminophen (TYLENOL) tablet 500-1,000 mg (has no administration in time range)  aspirin EC tablet 81 mg (has no administration in time range)  prazosin (MINIPRESS) capsule 1 mg (has no administration in time range)  spironolactone (ALDACTONE) tablet 25 mg (has no administration in time range)  buPROPion (WELLBUTRIN XL) 24 hr tablet 150 mg (has no administration in time range)  FLUoxetine (PROZAC) capsule 40 mg (has no administration in time range)  mirtazapine (REMERON) tablet 30 mg (has no administration in time range)  risperiDONE (RISPERDAL) tablet 3 mg (has no administration in time range)  traZODone (DESYREL) tablet 100 mg (has no administration in time range)  docusate sodium (COLACE) capsule 100 mg (has no administration in time range)  pantoprazole (PROTONIX) EC tablet 40 mg (has no administration in time range)  benztropine (COGENTIN) tablet 0.25 mg (has no administration in time range)  clonazePAM (KLONOPIN) tablet 0.25 mg (has no administration in time range)  pregabalin (LYRICA) capsule 50 mg (has no administration in time range)  enoxaparin (LOVENOX) injection 40 mg (has no administration in time range)  sodium chloride flush (NS) 0.9 % injection 3 mL (has no administration in time range)  sodium chloride flush (NS) 0.9 % injection 3 mL (has no administration in time range)  0.9 %  sodium chloride infusion (has no administration in time range)  albuterol (PROVENTIL) (2.5 MG/3ML) 0.083% nebulizer solution 2.5 mg (has no administration in time range)  potassium chloride SA (K-DUR,KLOR-CON) CR tablet 40 mEq (has no administration in time range)      Initial Impression / Assessment and Plan / ED Course  I have reviewed the triage vital signs and the nursing notes.  Pertinent labs & imaging results that were available during my care of the patient were reviewed by me and considered in my medical decision making (see chart for details).     Patient here for evaluation of shortness of breath. She does have a significant new oxygen requirement on evaluation. Concern for CHF with pulmonary edema. Family medicine consulted for admission for further treatment. Final Clinical Impressions(s) / ED Diagnoses   Final diagnoses:  None    ED Discharge Orders    None       Quintella Reichert, MD 05/11/18 0100

## 2018-05-10 NOTE — H&P (Addendum)
[facility] Admission History and Physical Service Pager: [phone]  Patient name: Lauren Wood Medical record number: [id number] Date of birth: January 12, 1961 Age: 57 y.o. Gender: female  Primary Care Provider: Bufford Lope, DO Consultants: none Code Status: full  Chief Complaint: "short of breath"  Assessment and Plan: Lauren Wood is a 57 y.o. female presenting with about 1 week of SOB. PMH is significant for heart failure, sleep apnea, hypertension, obesity.  Acute hypoxic respiratory failure secondary to acute decompensated heart failure- Has known HFpEF (55-65% in 2014), however not on diuretics or established with cardiology. Reported 2 weeks of nasal congestion and cough followed by 1 week of shortness of breath.  She came into the hospital today after her sister noticed significant fatigue from walking to the trash can and back.  In the past day, sister is also noted increased lower extremity swelling.  She now has a new oxygen requirement 5 L nasal cannula (on CPAP at home and previously on 3 L QHS which was stopped earlier in the year). On admission her vitals were remarkable for high systolic pressures ranging from 140s to 170s.  Physical exam was remarkable for significant lower extremity edema, nonpitting, rales and wheezing on pulmonary auscultation.  Her admission labs are remarkable for BNP 358, troponin 0 0.07, creatinine 1.1, potassium 3.2.  Her chest x-ray was remarkable for increased pulmonary congestion.  No history of ACS. Her EKG showed no ST or T wave changes.  Pneumonia seems unlikely at this time due to lack of infectious symptoms, elevated WBC and x-ray findings.  Bronchospasm may be contributing to her acutely worsened respiratory status based on her physical exam.  Acute decompensated heart failure seems to be the most likely diagnosis at this time based on physical exam fluid overload and elevated BNP with pulmonary congestion seen on chest  x-ray.  We will admit inpatient begin diuresis with Lasix.  The documented Lasix allergy was discussed with the POA who denied an anaphylactic reaction to the previous administration of Lasix.  Patient's POA (sister) understood and approved of using Lasix at this time. Suspected viral infection in the setting of likely restrictive lung disease d/t obesity and OSA has contributed to pulmonary vascular congestion. Will need further investigation of cardiac function. -Admit to inpatient, attending Dr. Andria Frames -Cardiac monitoring -Trend troponins -Follow-up a.m. EKG -Follow-up echo -Begin Lasix IV 40 mg twice daily -Continue spironolactone 25 mg -Strict I's and O's -Daily weights -Albuterol nebulizers every 2 hours PRN -Supplemental oxygen PRN, wean as tolerated -Follow-up risk stratification labs (TSH, lipid panel, A1c) -Tylenol for pain -Monitor vitals  Hypokalemia-admission labs showed a potassium of 3.2. -Replete potassium PRN -Monitor potassium -Monitor magnesium  Hypertension-her chronic hypertension is managed with carvedilol 25 mg twice daily, losartan 50 mg once daily, spironolactone 25 mg daily.  On admission her blood pressure was remarkable for systolics up to 147.  We will be holding her beta-blocker in the setting of acute decompensated heart failure.  We will hold losartan in the setting of potential aggressive diuresis. -Hold carvedilol -Hold losartan -Continue spironolactone -Monitor BP  CKD stage II-baseline creatinine appears to be around 1.1.  Creatinine on admission is 1.1.  We will continue to monitor the setting of aggressive diuresis. -Holding losartan -Continue to monitor  Mental retardation, borderline personality disorder, mental retardation-Ms. Bucy has a number of psychiatric conditions which are managed by behavioral health provider (Dr. Redmond Baseman).  Her current medications include bupropion, clonazepam, fluoxetine, mirtazapine, risperidone, trazodone,  cogentin. -Continue bupropion -Continue clonazepam -Continue fluoxetine -Continue mirtazapine -Continue risperidone -Continue trazodone -Continue cogentin  GERD-Home medication includes omeprazole 20 mg daily.  She is not currently endorsing any symptoms of GERD. -Start Protonix 40 mg daily in place of omeprazole  Sleep apnea-CPAP use at home.  -CPAP ordered  FEN/GI: Protonix, heart healthy/carb modified diet Prophylaxis: Lovenox  Disposition: Discharge home pending appropriate diuresis and resolution of new oxygen requirement  History of Present Illness:  Lauren Wood is a 57 y.o. female presenting with URI symptoms for 2 weeks, shortness of breath for 1 week, lower extremity edema for 1 day.  Her previous medical history is significant for heart failure, sleep apnea, hypertension, obesity.  Due to her medical history of mental retardation, her sister and [facility] POA provided most of the medical history.  Her sister reports that for the past 2 weeks Lauren Wood has been experiencing cold-like symptoms of nasal congestion and cough.  The cough has been productive and pink in color, today she had at least one episode of coughing up frank blood.  1 week ago, she began to notice mild shortness of breath with exertion.  Today (12/27), the sister noted that she seemed significantly winded from simply walking to the trashcan outside and returning.  Sister was concerned that Lauren Wood was going to pass out from the mild exertion.  This is very abnormal for Lauren Wood.  Due to this is significant shortness of breath with mild exertion, the sister decided to come to the emergency room to be seen.  She denied chest pain, palpitations, nausea, vomiting.  On ROS, sister noted that Lauren Wood does have fluctuating lower extremity edema that seems to have significantly worsened today.  Does not regularly take diuretics.  The sister is aware that she had a reaction to taking Lasix in the past and recalled that  the reaction was not related to swelling or trouble breathing or rashes.  The ED, a chest x-ray showed significant pulmonary congestion, EKG showed no signs of MI, and BNP returned elevated to 358.  Diuresis was not started at that time due to concern for patient's allergy.  Ambulatory pulse ox showed desaturations to the high 70s with slow return to 90s with supplemental oxygen.  Review Of Systems: See HPI for pertinent.  Review of Systems  Constitutional: Negative for chills and fever.  HENT: Positive for congestion and sore throat.   Eyes: Negative for blurred vision and double vision.  Respiratory: Positive for cough, hemoptysis and shortness of breath.   Cardiovascular: Positive for leg swelling. Negative for chest pain.  Gastrointestinal: Negative for abdominal pain, constipation, diarrhea, nausea and vomiting.  Genitourinary: Positive for frequency. Negative for dysuria and urgency.  Musculoskeletal: Negative for back pain and myalgias.  Skin: Negative for itching and rash.  Neurological: Negative for tremors and headaches.    Patient Active Problem List   Diagnosis Date Noted  . CHF (congestive heart failure) ([place]) 05/10/2018  . Diverticulosis of colon 11/22/2016  . Restless leg syndrome 09/05/2016  . Urinary incontinence 12/24/2014  . Gout of foot 09/26/2013  . Onychomycosis 09/25/2013  . Personal history of colonic polyps 06/24/2013  . DOE (dyspnea on exertion) 05/02/2013  . Health care maintenance 02/11/2013  . Hearing loss in left ear 02/11/2013  . OSA (obstructive sleep apnea) 12/11/2012  . Borderline personality disorder ([place]) 05/31/2011  . CKD (chronic kidney disease) stage 3, GFR 30-59 ml/min (HCC) 02/06/2011  . CHF with left ventricular diastolic dysfunction, NYHA  class 1 ([place]) 02/06/2011  . Psychosis ([place]) 12/20/2009  . Morbid obesity ([place]) 08/02/2009  . MENTAL RETARDATION 08/02/2009  . Essential hypertension, benign 08/02/2009  . GERD 08/02/2009  .  OSTEOARTHRITIS, MULTIPLE JOINTS 08/02/2009  . SLEEP DISORDER 08/02/2009    Past Medical History: Past Medical History:  Diagnosis Date  . ALLERGIC RHINITIS 08/02/2009  . Arthritis   . Boil of vulva 11/07/2012  . Borderline personality disorder ([place]) 05/31/2011  . Gout of foot 09/26/2013   L foot 09/2013. In the setting of HCTZ 25 mg and lasix 40 mg daily. Stopped HCTZ after decreasing to 12.5 mg. Treated with colchicine.    Marland Kitchen Heart failure (HCC)    chronic  . Heart murmur   . Hip pain 12/03/2012  . HTN (hypertension)   . Kidney disease    chronic  . 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 Obesity   . 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 PRESSURE ULCER OTHER SITE 10/01/2009   hx of  . Sleep apnea    uses cpap, setting of 2  . Sleep apnea   . Unspecified psychosis 12/20/2009    Past Surgical History: Past Surgical History:  Procedure Laterality Date  . COLONOSCOPY N/A 06/24/2013   Procedure: COLONOSCOPY;  Surgeon: Gatha Mayer, MD;  Location: [facility]COPY;  Service: Endoscopy;  Laterality: N/A;  . COLONOSCOPY WITH PROPOFOL N/A 10/05/2015   Procedure: COLONOSCOPY WITH PROPOFOL;  Surgeon: Gatha Mayer, MD;  Location: [facility] ENDOSCOPY;  Service: Endoscopy;  Laterality: N/A;  . cyst removed  2011   forehead  . TYMPANOSTOMY TUBE PLACEMENT Bilateral yrs ago    Social History: Social History   Tobacco Use  . Smoking status: Never Smoker  . Smokeless tobacco: Never Used  Substance Use Topics  . Alcohol use: No    Alcohol/week: 0.0 standard drinks  . Drug use: No    Family History: Family History  Problem Relation Age of Onset  . Colon cancer Father        around age 54  . Diabetes Mother   . Esophageal cancer Neg Hx   . Pancreatic cancer Neg Hx   . Stomach cancer Neg Hx   . Liver disease Neg Hx     Allergies and  Medications: Allergies  Allergen Reactions  . Enalapril Maleate Other (See Comments)    Reaction unknown ??  . Hctz [Hydrochlorothiazide] Other (See Comments)    Hyperuricemia with Lasix   . Lasix [Furosemide] Other (See Comments)    Hyperuricemia with HCTZ     No current facility-administered medications on file prior to encounter.    Current Outpatient Medications on File Prior to Encounter  Medication Sig Dispense Refill  . acetaminophen (TYLENOL) 500 MG tablet Take 500-1,000 mg by mouth every 6 (six) hours as needed (for headaches).    Marland Kitchen aspirin (QC LO-DOSE ASPIRIN) 81 MG EC tablet Take 1 tablet (81 mg total) by mouth daily. Swallow whole. 30 tablet 11  . benztropine (COGENTIN) 0.5 MG tablet Take 0.25 mg by mouth 2 (two) times daily.    Marland Kitchen buPROPion (WELLBUTRIN XL) 150 MG 24 hr tablet Take 150 mg by mouth every morning.    . carvedilol (COREG) 25 MG tablet TAKE ONE TABLET TWICE DAILY WITH A MEAL (Patient taking differently: Take 25 mg by  mouth 2 (two) times daily with a meal. ) 120 tablet 3  . clonazePAM (KLONOPIN) 0.5 MG tablet Take 0.25 mg by mouth 2 (two) times daily as needed (for anxiety or sleep).     . DOK 100 MG capsule TAKE ONE CAPSULE TWICE DAILY AS NEEDED FOR MILD OR MODERATE CONSTIPATION (Patient taking differently: Take 100 mg by mouth 2 (two) times daily as needed for mild constipation or moderate constipation. ) 60 capsule 0  . FLUoxetine (PROZAC) 40 MG capsule Take 40 mg by mouth every morning.    Marland Kitchen losartan (COZAAR) 50 MG tablet TAKE ONE TABLET DAILY (Patient taking differently: Take 50 mg by mouth daily. ) 90 tablet 2  . meloxicam (MOBIC) 15 MG tablet Take 1 tablet (15 mg total) by mouth daily as needed for pain. Daily for 4-5 days (with food), then daily as needed for knee pain (Patient taking differently: Take 15 mg by mouth daily as needed (for knee pain- take with food). ) 10 tablet 0  . mirtazapine (REMERON) 30 MG tablet Take 30 mg by mouth at bedtime.     Marland Kitchen  omeprazole (PRILOSEC) 20 MG capsule TAKE ONE CAPSULE EACH DAY (Patient taking differently: Take 20 mg by mouth daily. ) 90 capsule 2  . prazosin (MINIPRESS) 1 MG capsule Take 1 mg by mouth daily.     . pregabalin (LYRICA) 50 MG capsule TAKE 1 CAPSULE 1 TO 3 HOURS BEFORE BEDTIME (Patient taking differently: Take 50 mg by mouth See admin instructions. Take 50 mg by mouth 1-3 hours before bedtime) 90 capsule 2  . risperiDONE (RISPERDAL) 3 MG tablet Take 3 mg by mouth 2 (two) times daily.     Marland Kitchen spironolactone (ALDACTONE) 25 MG tablet TAKE ONE TABLET EACH DAY (Patient taking differently: Take 25 mg by mouth daily. ) 30 tablet 1  . traZODone (DESYREL) 100 MG tablet Take 100 mg by mouth at bedtime.       Objective: BP (!) 153/94   Pulse (!) 47   Temp 98.4 F (36.9 C) (Oral)   Resp (!) 46   LMP 05/08/2013   SpO2 96%  Exam: Physical Exam Constitutional:      General: She is in acute distress.     Appearance: She is obese.  Neck:     Musculoskeletal: Normal range of motion and neck supple.     Vascular: JVD: difficult to assess JVD due to body habitus.  Cardiovascular:     Comments: Distant heart sounds, difficult to assess secondary to body habitus Pulmonary:     Effort: Tachypnea present.     Comments: Mild wheezing on inspiration noted on anterior auscultation.  Lung sounds difficult to assess on posterior auscultation.  Mild rales in lower fields. Chest:     Chest wall: No mass, tenderness or edema.  Abdominal:     General: Bowel sounds are normal.     Palpations: Abdomen is soft.  Musculoskeletal:     Right lower leg: She exhibits no tenderness. Edema present.     Left lower leg: She exhibits no tenderness. Edema present.  Skin:    General: Skin is warm and dry.  Neurological:     General: No focal deficit present.  Psychiatric:        Mood and Affect: Mood normal.        Behavior: Behavior normal.      Labs and Imaging: CBC BMET  Recent Labs  Lab 05/10/18 2036  WBC  10.2  HGB 11.6*  HCT 38.5  PLT 241   Recent Labs  Lab 05/10/18 2037  NA 142  K 3.2*  CL 107  CO2 28  BUN 12  CREATININE 1.10*  GLUCOSE 90  CALCIUM 8.8*     Dg Chest 2 View  Result Date: 05/10/2018 CLINICAL DATA:  57 year old female with shortness of breath. EXAM: CHEST - 2 VIEW COMPARISON:  None. FINDINGS: Enlargement of the cardiopericardial silhouette is again noted with pulmonary vascular congestion. There is no evidence of focal airspace disease, pulmonary edema, suspicious pulmonary nodule/mass, definite pleural effusion, or pneumothorax. No acute bony abnormalities are identified. IMPRESSION: Enlargement of the cardiopericardial silhouette again noted with pulmonary vascular congestion. Electronically Signed   By: Margarette Canada M.D.   On: 05/10/2018 14:59     Matilde Haymaker, MD 05/10/2018, 10:42 PM PGY-1, [place] [facility] pager: [phone], text pages welcome  Upper Level Addendum: I have seen and evaluated this patient along with Dr. Pilar Plate and reviewed the above note, making necessary revisions in blue.  Harriet Butte, [place], PGY-3

## 2018-05-10 NOTE — Telephone Encounter (Signed)
Will forward to MD. Teshia Mahone,CMA  

## 2018-05-11 ENCOUNTER — Other Ambulatory Visit: Payer: Self-pay

## 2018-05-11 DIAGNOSIS — F79 Unspecified intellectual disabilities: Secondary | ICD-10-CM

## 2018-05-11 LAB — BLOOD GAS, ARTERIAL
Acid-Base Excess: 6.6 mmol/L — ABNORMAL HIGH (ref 0.0–2.0)
Bicarbonate: 31.5 mmol/L — ABNORMAL HIGH (ref 20.0–28.0)
Drawn by: 441371
O2 Content: 5 L/min
O2 Saturation: 94.6 %
Patient temperature: 99.5
pCO2 arterial: 54.2 mmHg — ABNORMAL HIGH (ref 32.0–48.0)
pH, Arterial: 7.385 (ref 7.350–7.450)
pO2, Arterial: 77.9 mmHg — ABNORMAL LOW (ref 83.0–108.0)

## 2018-05-11 LAB — COMPREHENSIVE METABOLIC PANEL
ALT: 14 U/L (ref 0–44)
AST: 25 U/L (ref 15–41)
Albumin: 3.1 g/dL — ABNORMAL LOW (ref 3.5–5.0)
Alkaline Phosphatase: 113 U/L (ref 38–126)
Anion gap: 15 (ref 5–15)
BUN: 12 mg/dL (ref 6–20)
CO2: 22 mmol/L (ref 22–32)
Calcium: 8.8 mg/dL — ABNORMAL LOW (ref 8.9–10.3)
Chloride: 106 mmol/L (ref 98–111)
Creatinine, Ser: 1.13 mg/dL — ABNORMAL HIGH (ref 0.44–1.00)
GFR calc Af Amer: >60 mL/min (ref >60)
GFR calc non Af Amer: 54 mL/min — ABNORMAL LOW (ref >60)
Glucose, Bld: 106 mg/dL — ABNORMAL HIGH (ref 70–99)
Potassium: 3.5 mmol/L (ref 3.5–5.1)
Sodium: 143 mmol/L (ref 135–145)
Total Bilirubin: 0.8 mg/dL (ref 0.3–1.2)
Total Protein: 7 g/dL (ref 6.5–8.1)

## 2018-05-11 LAB — LIPID PANEL
Cholesterol: 117 mg/dL (ref 0–200)
HDL: 37 mg/dL — ABNORMAL LOW (ref >40)
LDL Cholesterol: 73 mg/dL (ref 0–99)
Total CHOL/HDL Ratio: 3.2 RATIO
Triglycerides: 37 mg/dL (ref <150)
VLDL: 7 mg/dL (ref 0–40)

## 2018-05-11 LAB — CBC
HCT: 39.6 % (ref 36.0–46.0)
Hemoglobin: 11.9 g/dL — ABNORMAL LOW (ref 12.0–15.0)
MCH: 25.6 pg — ABNORMAL LOW (ref 26.0–34.0)
MCHC: 30.1 g/dL (ref 30.0–36.0)
MCV: 85.3 fL (ref 80.0–100.0)
Platelets: 208 10*3/uL (ref 150–400)
RBC: 4.64 MIL/uL (ref 3.87–5.11)
RDW: 14.8 % (ref 11.5–15.5)
WBC: 12.5 10*3/uL — ABNORMAL HIGH (ref 4.0–10.5)
nRBC: 0 % (ref 0.0–0.2)

## 2018-05-11 LAB — HEMOGLOBIN A1C
Hgb A1c MFr Bld: 5.3 % (ref 4.8–5.6)
Mean Plasma Glucose: 105.41 mg/dL

## 2018-05-11 LAB — TSH: TSH: 3.348 u[IU]/mL (ref 0.350–4.500)

## 2018-05-11 LAB — TROPONIN I
Troponin I: 0.05 ng/mL (ref <0.03)
Troponin I: 0.06 ng/mL (ref <0.03)
Troponin I: 0.06 ng/mL (ref <0.03)

## 2018-05-11 LAB — HIV ANTIBODY (ROUTINE TESTING W REFLEX): HIV Screen 4th Generation wRfx: NONREACTIVE

## 2018-05-11 MED ORDER — PNEUMOCOCCAL VAC POLYVALENT 25 MCG/0.5ML IJ INJ
0.5000 mL | INJECTION | INTRAMUSCULAR | Status: AC
  Administered 2018-05-12: 0.5 mL via INTRAMUSCULAR
  Filled 2018-05-11: qty 0.5

## 2018-05-11 MED ORDER — INFLUENZA VAC SPLIT QUAD 0.5 ML IM SUSY
0.5000 mL | PREFILLED_SYRINGE | INTRAMUSCULAR | Status: AC
  Administered 2018-05-12: 0.5 mL via INTRAMUSCULAR
  Filled 2018-05-11: qty 0.5

## 2018-05-11 MED ORDER — POTASSIUM CHLORIDE CRYS ER 20 MEQ PO TBCR
40.0000 meq | EXTENDED_RELEASE_TABLET | Freq: Three times a day (TID) | ORAL | Status: AC
  Administered 2018-05-11 (×2): 40 meq via ORAL
  Filled 2018-05-11 (×2): qty 2

## 2018-05-11 NOTE — Plan of Care (Signed)
  Problem: Education: Goal: Knowledge of General Education information will improve Description Including pain rating scale, medication(s)/side effects and non-pharmacologic comfort measures Outcome: Progressing   

## 2018-05-11 NOTE — Discharge Summary (Signed)
[facility] Discharge Summary  Patient name: Lauren Wood Medical record number: [id number] Date of birth: [birth year] Age: 57 y.o. Gender: female Date of Admission: 05/10/2018  Date of Discharge: 05/15/2018  Admitting Physician: Matilde Haymaker, MD  Primary Care Provider: Bufford Lope, DO Consultants: Cardiology  Indication for Hospitalization: shortness of breath  Discharge Diagnoses/Problem List:  Acute hypoxic respiratory failure likely secondary to acute decompensated heart failure (preserved ejection fraction) Hypokalemia Hypertension CKD stage II Mental retardation Borderline personality disorder GERD Sleep apnea  Disposition: Discharge home with home health  Discharge Condition: Stable   Discharge Exam:  General: Alert and cooperative and appears to be in no acute distress.  Severely obese woman. Cardio: Distant heart sounds.  Radial pulse.   Pulm: Clear to auscultation bilaterally, no crackles, wheezing, or diminished breath sounds. Normal respiratory effort Abdomen: Bowel sounds normal. Abdomen soft and non-tender.  Extremities: 1+ pitting edema in lower extremities.  Thick scaled hyperpigmented skin in lower extremities. Neuro: Cranial nerves grossly intact   Brief Hospital Course:  Lauren Wood is a 57 y.o. female presenting with about 1 week of SOB. PMH is significant for heart failure, sleep apnea, hypertension, obesity. She was put on supplemental oxygen and given furosemide. CXR showed pulmonary congestion. BNP was elevated at 358. T wave did not show an ST or T wave changes.  Troponins were flat at 0.06. An echo was performed and showed inferior and septal wall hypokenesis. Cardiology was consulted and recommended diuresis for acute decompensated heart failure.  Over the next 2 days, patient gradually decreased her supplemental oxygen requirement and was able to ambulate without D satting below 89.  Her oxygen saturating goal was over  88%.  On 1/1, she was found to be breathing comfortably on room air was medically stable.  During her hospitalization, her weight decreased from 369 pounds to 350 pounds and she had a total fluid loss of 7.7 L.  She was discharged home to her sister with home health and close follow-up.   Issues for Follow Up:  1.  Lasix started in the hospital.  Patient discharged on Lasix 40 p.o. daily.  Check creatinine, potassium.  Consider decreasing to Lasix 20 mg p.o. daily. 2.  Losartan held during hospitalization due elevated creatinine in the setting of diuresis.  Monitor creatinine and blood pressure, consider restarting losartan. 3.  Spironolactone was discontinued during hospitalization.  This is no proven mortality benefit for diastolic heart failure.  Likely not a helpful diuretic.  Lasix would be a superior diuretic.  Losartan would be a superior blood pressure medication.  Significant Procedures:  none  Significant Labs and Imaging:  Recent Labs  Lab 05/13/18 0307 05/14/18 0609 05/15/18 0645  WBC 9.7 8.8 8.9  HGB 11.9* 12.3 11.9*  HCT 38.3 40.4 39.8  PLT 246 244 239   Recent Labs  Lab 05/10/18 2037 05/11/18 0310 05/12/18 0410 05/13/18 0307 05/14/18 0609 05/15/18 0645  NA 142 143 142 140 140 140  K 3.2* 3.5 3.6 3.3* 4.3 4.0  CL 107 106 99 95* 98 95*  CO2 28 22 32 33* 32 36*  GLUCOSE 90 106* 96 96 88 86  BUN 12 12 12 15 17  21*  CREATININE 1.10* 1.13* 1.31* 1.27* 1.32* 1.38*  CALCIUM 8.8* 8.8* 9.2 9.2 9.3 9.6  MG  --   --   --  1.7  --   --   ALKPHOS 102 113  --   --   --   --  AST 22 25  --   --   --   --   ALT 14 14  --   --   --   --   ALBUMIN 3.0* 3.1*  --   --   --   --     Dg Chest 2 View  Result Date: 05/10/2018 CLINICAL DATA:  57 year old female with shortness of breath. EXAM: CHEST - 2 VIEW COMPARISON:  None. FINDINGS: Enlargement of the cardiopericardial silhouette is again noted with pulmonary vascular congestion. There is no evidence of focal airspace  disease, pulmonary edema, suspicious pulmonary nodule/mass, definite pleural effusion, or pneumothorax. No acute bony abnormalities are identified. IMPRESSION: Enlargement of the cardiopericardial silhouette again noted with pulmonary vascular congestion. Electronically Signed   By: Margarette Canada M.D.   On: 05/10/2018 14:59     Results/Tests Pending at Time of Discharge:  None  Discharge Medications:  Allergies as of 05/15/2018      Reactions   Enalapril Maleate Other (See Comments)   Reaction unknown ??   Hctz [hydrochlorothiazide] Other (See Comments)   Hyperuricemia with Lasix    Lasix [furosemide] Other (See Comments)   Hyperuricemia with HCTZ       Medication List    STOP taking these medications   clonazePAM 0.5 MG tablet Commonly known as:  KLONOPIN   losartan 50 MG tablet Commonly known as:  COZAAR   spironolactone 25 MG tablet Commonly known as:  ALDACTONE     TAKE these medications   acetaminophen 500 MG tablet Commonly known as:  TYLENOL Take 500-1,000 mg by mouth every 6 (six) hours as needed (for headaches).   aspirin 81 MG EC tablet Commonly known as:  QC LO-DOSE ASPIRIN Take 1 tablet (81 mg total) by mouth daily. Swallow whole.   benztropine 0.5 MG tablet Commonly known as:  COGENTIN Take 0.25 mg by mouth 2 (two) times daily.   buPROPion 150 MG 24 hr tablet Commonly known as:  WELLBUTRIN XL Take 150 mg by mouth every morning.   carvedilol 25 MG tablet Commonly known as:  COREG TAKE ONE TABLET TWICE DAILY WITH A MEAL What changed:  See the new instructions.   DOK 100 MG capsule Generic drug:  docusate sodium TAKE ONE CAPSULE TWICE DAILY AS NEEDED FOR MILD OR MODERATE CONSTIPATION What changed:  See the new instructions.   FLUoxetine 40 MG capsule Commonly known as:  PROZAC Take 40 mg by mouth every morning.   furosemide 40 MG tablet Commonly known as:  LASIX Take 1 tablet (40 mg total) by mouth daily. Start taking on:  May 16, 2018    meloxicam 15 MG tablet Commonly known as:  MOBIC Take 1 tablet (15 mg total) by mouth daily as needed for pain. Daily for 4-5 days (with food), then daily as needed for knee pain What changed:    reasons to take this  additional instructions   mirtazapine 30 MG tablet Commonly known as:  REMERON Take 30 mg by mouth at bedtime.   omeprazole 20 MG capsule Commonly known as:  PRILOSEC TAKE ONE CAPSULE EACH DAY What changed:  See the new instructions.   prazosin 1 MG capsule Commonly known as:  MINIPRESS Take 1 mg by mouth daily.   pregabalin 50 MG capsule Commonly known as:  LYRICA TAKE 1 CAPSULE 1 TO 3 HOURS BEFORE BEDTIME What changed:    how much to take  how to take this  when to take this  additional instructions  risperiDONE 3 MG tablet Commonly known as:  RISPERDAL Take 3 mg by mouth 2 (two) times daily.   traZODone 100 MG tablet Commonly known as:  DESYREL Take 100 mg by mouth at bedtime.       Discharge Instructions: Please refer to Patient Instructions section of EMR for full details.  Patient was counseled important signs and symptoms that should prompt return to medical care, changes in medications, dietary instructions, activity restrictions, and follow up appointments.   Follow-Up Appointments: Follow-up Information    [facility]-Home Follow up.   Specialty:  [place] Why:  HHPT/OT arranged- they will call you to set up home visits Contact information: [address] [place] [address] [phone]           Matilde Haymaker, MD 05/15/2018, 4:15 PM PGY-1, [place]

## 2018-05-11 NOTE — Telephone Encounter (Signed)
I am covering Dr. Lora Havens box. Aware of situation since I am covering inpatient service.  Harriet Butte, [place], PGY-3

## 2018-05-11 NOTE — ED Notes (Signed)
No addl blood draw,  Pt enroute to inpatient floor. 

## 2018-05-11 NOTE — Progress Notes (Signed)
Family Medicine Teaching Service Daily Progress Note Intern Pager: [phone]  Patient name: Lauren Wood Medical record number: [id number] Date of birth: 09-14-1960 Age: 57 y.o. Gender: female  Primary Care Provider: Bufford Lope, DO Consultants: none Code Status: Full code  Pt Overview and Major Events to Date:  Hospital Day 1 Admitted: 05/10/2018   Assessment and Plan: Lauren Wood is a 57 y.o. female presenting with about 1 week of SOB concerning for acute decompensated heart failure. PMH is significant for heart failure, sleep apnea, hypertension, obesity.  Acute hypoxic respiratory failure secondary to acute decompensated heart failure- Has known HFpEF (55-65% in 2014), however not on diuretics or established with cardiology.This morning patient states she is breathing fast but otherwise has not complaints.  On exam she is tachypnic with short, shallow breaths.  Concern for her respiratory issues being related to obesity rather than CHF exacerbation  currently on 5L [place]. BNP 358,  creatinine 1.1, potassium 3.2.  Her chest x-ray was remarkable for increased pulmonary congestion.  No history of ACS. Her EKG showed no ST or T wave changes. Troponin 0.05 > 0.06  .  Weight 167.4kg>.  LDL 73, A1c 5.3.  1.75L urine output since admission. She has been bradycardic during this admission with HR < 50 and not on BB.  - f/u ABG  -Cardiac monitoring -Trend troponins -Follow-up a.m. EKG -Follow-up echo -Lasix IV 40 mg twice daily -Continue spironolactone 25 mg -Strict I's and O's -Daily weights -Albuterol nebulizers every 2 hours PRN -Supplemental oxygen PRN, wean as tolerated -Follow-up risk stratification labs (TSH, lipid panel, A1c) -Tylenol for pain -Monitor vitals  Hypokalemia-admission labs showed a potassium of 3.2. Kdur 42meq x 2.  -Replete potassium PRN -Monitor potassium -Monitor magnesium  Hypertension- BP 124/71 this AM. her chronic hypertension is managed with  carvedilol 25 mg twice daily, losartan 50 mg once daily, spironolactone 25 mg daily.  On admission her blood pressure was remarkable for systolics up to 829.  We will be holding her beta-blocker in the setting of acute decompensated heart failure.  We will hold losartan in the setting of potential aggressive diuresis.  -Hold carvedilol -Hold losartan -Continue spironolactone -Monitor BP  CKD stage II-baseline creatinine appears to be around 1.1.  Creatinine on admission is 1.1.  We will continue to monitor the setting of aggressive diuresis. Cr 1.1> 1.13 -Holding losartan -Continue to monitor  Mental retardation, borderline personality disorder, -[name] has a number of psychiatric conditions which are managed by behavioral health provider (Dr. Redmond Baseman).  Her current medications include bupropion, clonazepam, fluoxetine, mirtazapine, risperidone, trazodone, cogentin. -Continue bupropion -Continue clonazepam -Continue fluoxetine -Continue mirtazapine -Continue risperidone -Continue trazodone -Continue cogentin  GERD-Home medication includes omeprazole 20 mg daily.  She is not currently endorsing any symptoms of GERD. -Start Protonix 40 mg daily in place of omeprazole  Sleep apnea-CPAP use at home.  -CPAP ordered  FEN/GI: Protonix, heart healthy/carb modified diet Prophylaxis: Lovenox  Disposition: home   Medications: Scheduled Meds: . aspirin EC  81 mg Oral Daily  . benztropine  0.25 mg Oral BID  . buPROPion  150 mg Oral q morning - 10a  . enoxaparin (LOVENOX) injection  40 mg Subcutaneous Q24H  . FLUoxetine  40 mg Oral q morning - 10a  . furosemide  40 mg Intravenous BID  . [START ON 05/12/2018] Influenza vac split quadrivalent PF  0.5 mL Intramuscular Tomorrow-1000  . mirtazapine  30 mg Oral QHS  . pantoprazole  40 mg Oral Daily  . [  START ON 05/12/2018] pneumococcal 23 valent vaccine  0.5 mL Intramuscular Tomorrow-1000  . potassium chloride  40 mEq Oral TID  .  prazosin  1 mg Oral Daily  . pregabalin  50 mg Oral QHS  . risperiDONE  3 mg Oral BID  . sodium chloride flush  3 mL Intravenous Q12H  . spironolactone  25 mg Oral Daily  . traZODone  100 mg Oral QHS   Continuous Infusions: . sodium chloride     PRN Meds: sodium chloride, acetaminophen, albuterol, clonazePAM, docusate sodium, sodium chloride flush  ================================================= ================================================= Subjective:  Patient states she is 'breathing fast' but otherwise has no respiratory complaints.  She states she was able to walk around the room with no dyspnea, but unsure if this is true.    Objective: Temp:  [98.3 F (36.8 C)-99.9 F (37.7 C)] 98.3 F (36.8 C) (12/28 0452) Pulse Rate:  [45-100] 47 (12/28 0452) Resp:  [20-46] 27 (12/28 0452) BP: (100-170)/(71-112) 124/71 (12/28 0452) SpO2:  [88 %-97 %] 96 % (12/28 0452) Weight:  [167.4 kg] 167.4 kg (12/28 0104) Intake/Output 12/27 0701 - 12/28 0700 In: -  Out: 3903 [Urine:1750] Physical Exam:  Gen: NAD, alert, obese, sitting comfortably on 5L [place] HEENT: Normocephaic, atraumatic. Clear conjuctiva, no scleral icterus and injection.  CV: Regular rate and rhythm.  Heart sounds difficult to auscultate. Normal capillary refill bilaterally.  Radial pulses 2+ bilaterally. Some edema of the lower legs bilaterally around the ankles but no pitting.  Resp: patient taking short shallow breaths at a rate around 30 RR/min.  No wheezing, rales, abnormal lung sounds.   Abd: large pannus, Nontender and nondistended on palpation to all 4 quadrants.  Positive bowel sounds. Psych: Cooperative with exam. Pleasant. Makes eye contact. Can carry on basic conversation but not always answering appropriately. Extremities: Full ROM    Laboratory: Recent Labs  Lab 05/10/18 2036 05/11/18 0310  WBC 10.2 12.5*  HGB 11.6* 11.9*  HCT 38.5 39.6  PLT 241 208   Recent Labs  Lab 05/10/18 2037  NA 142  K  3.2*  CL 107  CO2 28  BUN 12  CREATININE 1.10*  CALCIUM 8.8*  PROT 6.7  BILITOT 0.8  ALKPHOS 102  ALT 14  AST 22  GLUCOSE 90   Imaging/Diagnostic Tests: Dg Chest 2 View  Result Date: 05/10/2018 CLINICAL DATA:  57 year old female with shortness of breath. EXAM: CHEST - 2 VIEW COMPARISON:  None. FINDINGS: Enlargement of the cardiopericardial silhouette is again noted with pulmonary vascular congestion. There is no evidence of focal airspace disease, pulmonary edema, suspicious pulmonary nodule/mass, definite pleural effusion, or pneumothorax. No acute bony abnormalities are identified. IMPRESSION: Enlargement of the cardiopericardial silhouette again noted with pulmonary vascular congestion. Electronically Signed   By: Margarette Canada M.D.   On: 05/10/2018 14:59      Benay Pike, MD 05/11/2018, 8:00 AM PGY-1, [place] Intern pager: [phone], text pages welcome

## 2018-05-12 ENCOUNTER — Inpatient Hospital Stay (HOSPITAL_COMMUNITY): Payer: Medicare Other

## 2018-05-12 ENCOUNTER — Other Ambulatory Visit (HOSPITAL_COMMUNITY): Payer: Medicare Other

## 2018-05-12 ENCOUNTER — Encounter (HOSPITAL_COMMUNITY): Payer: Self-pay | Admitting: Physician Assistant

## 2018-05-12 DIAGNOSIS — I5033 Acute on chronic diastolic (congestive) heart failure: Secondary | ICD-10-CM

## 2018-05-12 DIAGNOSIS — E662 Morbid (severe) obesity with alveolar hypoventilation: Secondary | ICD-10-CM

## 2018-05-12 DIAGNOSIS — G4733 Obstructive sleep apnea (adult) (pediatric): Secondary | ICD-10-CM

## 2018-05-12 DIAGNOSIS — R0602 Shortness of breath: Secondary | ICD-10-CM

## 2018-05-12 DIAGNOSIS — J9601 Acute respiratory failure with hypoxia: Secondary | ICD-10-CM

## 2018-05-12 LAB — CBC WITH DIFFERENTIAL/PLATELET
Abs Immature Granulocytes: 0.03 10*3/uL (ref 0.00–0.07)
Basophils Absolute: 0 10*3/uL (ref 0.0–0.1)
Basophils Relative: 0 %
Eosinophils Absolute: 0.2 10*3/uL (ref 0.0–0.5)
Eosinophils Relative: 3 %
HCT: 37.8 % (ref 36.0–46.0)
Hemoglobin: 11.3 g/dL — ABNORMAL LOW (ref 12.0–15.0)
Immature Granulocytes: 0 %
Lymphocytes Relative: 21 %
Lymphs Abs: 1.9 10*3/uL (ref 0.7–4.0)
MCH: 25.6 pg — ABNORMAL LOW (ref 26.0–34.0)
MCHC: 29.9 g/dL — ABNORMAL LOW (ref 30.0–36.0)
MCV: 85.7 fL (ref 80.0–100.0)
Monocytes Absolute: 0.9 10*3/uL (ref 0.1–1.0)
Monocytes Relative: 10 %
Neutro Abs: 5.9 10*3/uL (ref 1.7–7.7)
Neutrophils Relative %: 66 %
Platelets: 230 10*3/uL (ref 150–400)
RBC: 4.41 MIL/uL (ref 3.87–5.11)
RDW: 14.6 % (ref 11.5–15.5)
WBC: 9 10*3/uL (ref 4.0–10.5)
nRBC: 0 % (ref 0.0–0.2)

## 2018-05-12 LAB — BASIC METABOLIC PANEL
Anion gap: 11 (ref 5–15)
BUN: 12 mg/dL (ref 6–20)
CO2: 32 mmol/L (ref 22–32)
Calcium: 9.2 mg/dL (ref 8.9–10.3)
Chloride: 99 mmol/L (ref 98–111)
Creatinine, Ser: 1.31 mg/dL — ABNORMAL HIGH (ref 0.44–1.00)
GFR calc Af Amer: 52 mL/min — ABNORMAL LOW (ref >60)
GFR calc non Af Amer: 45 mL/min — ABNORMAL LOW (ref >60)
Glucose, Bld: 96 mg/dL (ref 70–99)
Potassium: 3.6 mmol/L (ref 3.5–5.1)
Sodium: 142 mmol/L (ref 135–145)

## 2018-05-12 LAB — TROPONIN I
Troponin I: 0.04 ng/mL (ref <0.03)
Troponin I: 0.05 ng/mL (ref <0.03)
Troponin I: 0.05 ng/mL (ref <0.03)

## 2018-05-12 LAB — ECHOCARDIOGRAM COMPLETE
Height: 60 in
Weight: 5712 oz

## 2018-05-12 MED ORDER — FUROSEMIDE 10 MG/ML IJ SOLN
40.0000 mg | Freq: Once | INTRAMUSCULAR | Status: AC
  Administered 2018-05-12: 40 mg via INTRAVENOUS
  Filled 2018-05-12: qty 4

## 2018-05-12 MED ORDER — PERFLUTREN LIPID MICROSPHERE
1.0000 mL | INTRAVENOUS | Status: AC | PRN
  Administered 2018-05-12: 2 mL via INTRAVENOUS
  Filled 2018-05-12: qty 10

## 2018-05-12 MED ORDER — FUROSEMIDE 40 MG PO TABS
40.0000 mg | ORAL_TABLET | Freq: Every day | ORAL | Status: DC
  Administered 2018-05-13 – 2018-05-15 (×3): 40 mg via ORAL
  Filled 2018-05-12 (×3): qty 1

## 2018-05-12 NOTE — Progress Notes (Signed)
Placed patient on CPAP for the night with pressure set at 10cm. Oxygen set at 4lpm with Sp02=98% at this time.

## 2018-05-12 NOTE — Progress Notes (Signed)
  Echocardiogram 2D Echocardiogram with definity has been performed.  Darlina Sicilian M 05/12/2018, 2:38 PM

## 2018-05-12 NOTE — Progress Notes (Signed)
Family Medicine Teaching Service Daily Progress Note Intern Pager: [phone]  Patient name: Lauren Wood Medical record number: [id number] Date of birth: 02/29/1961 Age: 57 y.o. Gender: female  Primary Care Provider: Bufford Lope, DO Consultants: none Code Status: Full code  Pt Overview and Major Events to Date:  Hospital Day 2 Admitted: 05/10/2018   Assessment and Plan: Lauren Wood is a 57 y.o. female presenting with about 1 week of SOB concerning for acute decompensated heart failure. PMH is significant for heart failure, sleep apnea, hypertension, obesity.  Acute hypoxic respiratory failure secondary to acute decompensated heart failure- Has known HFpEF (55-65% in 2014), however not on diuretics or established with cardiology.This morning patient has no complaints.  On exam she has  short, shallow breaths.  Concern for her respiratory issues being related to obesity rather than CHF exacerbation but her ABG showed pH wnl and CO2 only slightly elevated.  patient was on 7L [place] when I entered the room but I was able to titrate to 4L by the time I left while maintaining good saturations.  BNP 358,  Her initial chest x-ray was remarkable for increased pulmonary congestion.  No history of ACS. Her EKG showed no ST or T wave changes. Troponin 0.05 > 0.06>0.06>0.04 .   LDL 73, A1c 5.3.  1.75L urine output since admission. She has been bradycardic during this admission with HR < 50 and not on BB.  Daily weight 167.4>161.9kg.  -Cardiac monitoring -Follow-up echo -holding lasix in setting of increase in Creatinine -Continue spironolactone 25 mg -Strict I's and O's -Daily weights -Albuterol nebulizers every 2 hours PRN -Supplemental oxygen PRN, wean as tolerated -Tylenol for pain -Monitor vitals  Hypokalemia-resolved -Replete potassium PRN -Monitor potassium  -Monitor magnesium  Hypertension- BP 144/83 this AM. her chronic hypertension is managed with carvedilol 25 mg twice daily,  losartan 50 mg once daily, spironolactone 25 mg daily.  On admission her blood pressure was remarkable for systolics up to 818.  We will be holding her beta-blocker in the setting of acute decompensated heart failure.  We will hold losartan in the setting of potential aggressive diuresis.  -Hold carvedilol -Hold losartan -Continue spironolactone -Monitor BP  CKD stage II-baseline creatinine appears to be around 1.1.  Creatinine on admission is 1.1.  We will continue to monitor the setting of aggressive diuresis. Cr 1.1> 1.13> 1.31 - consider holding lasix in setting of increased Creatinine and large decrease in weight since admission -Holding losartan -Continue to monitor  Mental retardation, borderline personality disorder, -Lauren Wood has a number of psychiatric conditions which are managed by behavioral health provider (Lauren Wood).  Her current medications include bupropion, clonazepam, fluoxetine, mirtazapine, risperidone, trazodone, cogentin. -Continue bupropion -Continue clonazepam -Continue fluoxetine -Continue mirtazapine -Continue risperidone -Continue trazodone -Continue cogentin  GERD-Home medication includes omeprazole 20 mg daily.  She is not currently endorsing any symptoms of GERD. -Start Protonix 40 mg daily in place of omeprazole  Sleep apnea-CPAP use at home.  -CPAP ordered  FEN/GI: Protonix, heart healthy/carb modified diet Prophylaxis: Lovenox  Disposition: home   Medications: Scheduled Meds: . aspirin EC  81 mg Oral Daily  . benztropine  0.25 mg Oral BID  . buPROPion  150 mg Oral q morning - 10a  . enoxaparin (LOVENOX) injection  40 mg Subcutaneous Q24H  . FLUoxetine  40 mg Oral q morning - 10a  . furosemide  40 mg Intravenous BID  . Influenza vac split quadrivalent PF  0.5 mL Intramuscular Tomorrow-1000  . mirtazapine  30 mg Oral QHS  . pantoprazole  40 mg Oral Daily  . pneumococcal 23 valent vaccine  0.5 mL Intramuscular Tomorrow-1000  . prazosin   1 mg Oral Daily  . pregabalin  50 mg Oral QHS  . risperiDONE  3 mg Oral BID  . sodium chloride flush  3 mL Intravenous Q12H  . spironolactone  25 mg Oral Daily  . traZODone  100 mg Oral QHS   Continuous Infusions: . sodium chloride     PRN Meds: sodium chloride, acetaminophen, albuterol, docusate sodium, sodium chloride flush  ================================================= ================================================= Subjective:  Patient has no complaints this morning. Spoke with sister and she states she startedd noticing the worsening breathing on Thursday, the day before admission, and at baseline patient is not on O2 and can walk around the [employer] for about 10 minutes before having to rest.   Objective: Temp:  [98.6 F (37 C)-99.5 F (37.5 C)] 98.8 F (37.1 C) (12/29 0642) Pulse Rate:  [82-102] 82 (12/29 0642) Resp:  [20-36] 21 (12/29 0642) BP: (132-144)/(83-98) 144/83 (12/29 0642) SpO2:  [91 %-98 %] 98 % (12/29 0642) Weight:  [161.9 kg] 161.9 kg (12/29 0923) Intake/Output 12/28 0701 - 12/29 0700 In: 104 [P.O.:520] Out: 2151 [Urine:2150; Stool:1] Physical Exam:  Gen: NAD, alert, obese, sitting comfortably on 5L [place] HEENT: Normocephaic, atraumatic. Clear conjuctiva, no scleral icterus and injection.  CV: Regular rate and rhythm.  Heart sounds difficult to auscultate.  Radial pulses 2+ bilaterally. Some edema of the lower legs bilaterally around the ankles but no pitting.  Resp: patient taking short shallow breaths. She initially had a wet sounding cough when asked to inhale deeply  Some wheezing heard anteriorly on the right side.  Abd: large pannus, Nontender and nondistended on palpation to all 4 quadrants.  Positive bowel sounds. Psych: Cooperative with exam. Pleasant. Makes eye contact. Can carry on basic conversation but not always answering appropriately. Extremities: Full ROM    Laboratory: Recent Labs  Lab 05/10/18 2036 05/11/18 0310 05/12/18 0410   WBC 10.2 12.5* 9.0  HGB 11.6* 11.9* 11.3*  HCT 38.5 39.6 37.8  PLT 241 208 230   Recent Labs  Lab 05/10/18 2037 05/11/18 0310 05/12/18 0410  NA 142 143 142  K 3.2* 3.5 3.6  CL 107 106 99  CO2 28 22 32  BUN 12 12 12   CREATININE 1.10* 1.13* 1.31*  CALCIUM 8.8* 8.8* 9.2  PROT 6.7 7.0  --   BILITOT 0.8 0.8  --   ALKPHOS 102 113  --   ALT 14 14  --   AST 22 25  --   GLUCOSE 90 106* 96   Imaging/Diagnostic Tests: Dg Chest 2 View  Result Date: 05/10/2018 CLINICAL DATA:  57 year old female with shortness of breath. EXAM: CHEST - 2 VIEW COMPARISON:  None. FINDINGS: Enlargement of the cardiopericardial silhouette is again noted with pulmonary vascular congestion. There is no evidence of focal airspace disease, pulmonary edema, suspicious pulmonary nodule/mass, definite pleural effusion, or pneumothorax. No acute bony abnormalities are identified. IMPRESSION: Enlargement of the cardiopericardial silhouette again noted with pulmonary vascular congestion. Electronically Signed   By: Margarette Canada M.D.   On: 05/10/2018 14:59      Benay Pike, MD 05/12/2018, 6:53 AM PGY-1, [place] Intern pager: [phone], text pages welcome

## 2018-05-12 NOTE — Consult Note (Addendum)
Cardiology Consultation:   Patient ID: Lauren Wood; [id number]; [birth year]   Admit date: 05/10/2018 Date of Consult: 05/12/2018  Primary Care Provider: Bufford Lope, DO Primary Cardiologist: Hochrein Primary Electrophysiologist:  None   Patient Profile:   Lauren Wood is a 57 y.o. female with a hx of morbid obesity, OSA on CPAP, gout, CKD stage III, mental retardation, colon polyps, HOH, who is being seen today for the evaluation of acute on chronic diastolic CHF at the request of Dr. Andria Frames.  History of Present Illness:   Lauren Wood was evaluated by Dr. Percival Spanish for dyspnea in 2014.  An echocardiogram at that time showed a normal EF with only grade 1 diastolic dysfunction and a PAS of 36. He felt her dyspnea was related to morbid obesity and inactivity.  She came to the ER on 12/27 with her sister and POA.  They were describing 2 weeks of increased congestion and a relatively sudden onset of shortness of breath that started the day of admission.  She was describing URI/cold symptoms for 2 weeks.  Cough was productive of clear mucus.  Patient also had some lower extremity edema.  No family is with her today.  Patient states that she was short of breath when she came to the hospital, it is improved now.  She was not on home O2, she is currently on a nasal cannula.  The patient states that her breathing has improved since she got here.  She is not clear about what she was able to do before her breathing got bad.  Unable to determine baseline activity level.  She denies cough at this time.  She states that her legs were swelling, but cannot say for how long.  She says she is compliant with CPAP.  She denies PND.  She denies chest pain or palpitations.  No presyncope or syncope.  Her sister and brother do the cooking.  She says she does not add salt to food.   Past Medical History:  Diagnosis Date  . ALLERGIC RHINITIS 08/02/2009  . Arthritis   . Boil of vulva 11/07/2012  .  Borderline personality disorder ([place]) 05/31/2011  . CKD (chronic kidney disease) stage 3, GFR 30-59 ml/min (HCC)    chronic  . Gout of foot 09/26/2013   L foot 09/2013. In the setting of HCTZ 25 mg and lasix 40 mg daily. Stopped HCTZ after decreasing to 12.5 mg. Treated with colchicine.    Marland Kitchen Heart failure (HCC)    chronic  . Heart murmur   . Hip pain 12/03/2012  . HTN (hypertension)   . 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 Obesity   . 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 PRESSURE ULCER OTHER SITE 10/01/2009   hx of  . Sleep apnea    uses cpap, setting of 2  . Unspecified psychosis 12/20/2009    Past Surgical History:  Procedure Laterality Date  . COLONOSCOPY N/A 06/24/2013   Procedure: COLONOSCOPY;  Surgeon: Gatha Mayer, MD;  Location: [facility];  Service: Endoscopy;  Laterality: N/A;  . COLONOSCOPY WITH PROPOFOL N/A 10/05/2015   Procedure: COLONOSCOPY WITH PROPOFOL;  Surgeon: Gatha Mayer, MD;  Location: [facility];  Service: Endoscopy;  Laterality: N/A;  . cyst removed  2011   forehead  . TYMPANOSTOMY TUBE PLACEMENT  Bilateral yrs ago     Prior to Admission medications   Medication Sig Start Date End Date Taking? Authorizing Provider  acetaminophen (TYLENOL) 500 MG tablet Take 500-1,000 mg by mouth every 6 (six) hours as needed (for headaches).   Yes [provider]  aspirin (QC LO-DOSE ASPIRIN) 81 MG EC tablet Take 1 tablet (81 mg total) by mouth daily. Swallow whole. 11/23/16  Yes Smiley Houseman, MD  benztropine (COGENTIN) 0.5 MG tablet Take 0.25 mg by mouth 2 (two) times daily.   Yes [provider]  buPROPion (WELLBUTRIN XL) 150 MG 24 hr tablet Take 150 mg by mouth every morning.   Yes [provider]  carvedilol (COREG) 25 MG tablet TAKE ONE TABLET TWICE DAILY WITH A MEAL Patient  taking differently: Take 25 mg by mouth 2 (two) times daily with a meal.  09/03/17  Yes Smiley Houseman, MD  clonazePAM (KLONOPIN) 0.5 MG tablet Take 0.25 mg by mouth 2 (two) times daily as needed (for anxiety or sleep).  04/11/13  Yes [provider]  DOK 100 MG capsule TAKE ONE CAPSULE TWICE DAILY AS NEEDED FOR MILD OR MODERATE CONSTIPATION Patient taking differently: Take 100 mg by mouth 2 (two) times daily as needed for mild constipation or moderate constipation.  02/26/17  Yes Smiley Houseman, MD  FLUoxetine (PROZAC) 40 MG capsule Take 40 mg by mouth every morning.   Yes [provider]  losartan (COZAAR) 50 MG tablet TAKE ONE TABLET DAILY Patient taking differently: Take 50 mg by mouth daily.  04/30/18  Yes Bufford Lope, DO  meloxicam (MOBIC) 15 MG tablet Take 1 tablet (15 mg total) by mouth daily as needed for pain. Daily for 4-5 days (with food), then daily as needed for knee pain Patient taking differently: Take 15 mg by mouth daily as needed (for knee pain- take with food).  09/03/17  Yes Smiley Houseman, MD  mirtazapine (REMERON) 30 MG tablet Take 30 mg by mouth at bedtime.  04/11/13  Yes [provider]  omeprazole (PRILOSEC) 20 MG capsule TAKE ONE CAPSULE EACH DAY Patient taking differently: Take 20 mg by mouth daily.  04/10/18  Yes Orson Eva J, DO  prazosin (MINIPRESS) 1 MG capsule Take 1 mg by mouth daily.  11/03/14  Yes [provider]  pregabalin (LYRICA) 50 MG capsule TAKE 1 CAPSULE 1 TO 3 HOURS BEFORE BEDTIME Patient taking differently: Take 50 mg by mouth See admin instructions. Take 50 mg by mouth 1-3 hours before bedtime 01/16/18  Yes Orson Eva J, DO  risperiDONE (RISPERDAL) 3 MG tablet Take 3 mg by mouth 2 (two) times daily.  04/26/13  Yes [provider]  spironolactone (ALDACTONE) 25 MG tablet TAKE ONE TABLET EACH DAY Patient taking differently: Take 25 mg by mouth daily.  04/18/18  Yes Bufford Lope, DO  traZODone  (DESYREL) 100 MG tablet Take 100 mg by mouth at bedtime.  07/14/12  Yes [provider]    Inpatient Medications: Scheduled Meds: . aspirin EC  81 mg Oral Daily  . benztropine  0.25 mg Oral BID  . buPROPion  150 mg Oral q morning - 10a  . enoxaparin (LOVENOX) injection  40 mg Subcutaneous Q24H  . FLUoxetine  40 mg Oral q morning - 10a  . mirtazapine  30 mg Oral QHS  . pantoprazole  40 mg Oral Daily  . prazosin  1 mg Oral Daily  . pregabalin  50 mg Oral QHS  .  risperiDONE  3 mg Oral BID  . sodium chloride flush  3 mL Intravenous Q12H  . spironolactone  25 mg Oral Daily  . traZODone  100 mg Oral QHS   Continuous Infusions: . sodium chloride     PRN Meds: sodium chloride, acetaminophen, albuterol, docusate sodium, perflutren lipid microspheres (DEFINITY) IV suspension, sodium chloride flush  Allergies:    Allergies  Allergen Reactions  . Enalapril Maleate Other (See Comments)    Reaction unknown ??  . Hctz [Hydrochlorothiazide] Other (See Comments)    Hyperuricemia with Lasix   . Lasix [Furosemide] Other (See Comments)    Hyperuricemia with HCTZ      Social History:   Social History   Socioeconomic History  . Marital status: Single    Spouse name: Not on file  . Number of children: 0  . Years of education: Not on file  . Highest education level: Not on file  Occupational History  . Occupation: Disabled  Social Needs  . Financial resource strain: Not on file  . Food insecurity:    Worry: Not on file    Inability: Not on file  . Transportation needs:    Medical: Not on file    Non-medical: Not on file  Tobacco Use  . Smoking status: Never Smoker  . Smokeless tobacco: Never Used  Substance and Sexual Activity  . Alcohol use: No    Alcohol/week: 0.0 standard drinks  . Drug use: No  . Sexual activity: Never  Lifestyle  . Physical activity:    Days per week: Not on file    Minutes per session: Not on file  . Stress: Not on file  Relationships  .  Social connections:    Talks on phone: Not on file    Gets together: Not on file    Attends religious service: Not on file    Active member of club or organization: Not on file    Attends meetings of clubs or organizations: Not on file    Relationship status: Not on file  . Intimate partner violence:    Fear of current or ex partner: Not on file    Emotionally abused: Not on file    Physically abused: Not on file    Forced sexual activity: Not on file  Other Topics Concern  . Not on file  Social History Narrative   Goes to adult daycare.  Lives with sister.            Family History:   Family History  Problem Relation Age of Onset  . Colon cancer Father        around age 24  . Diabetes Mother   . Esophageal cancer Neg Hx   . Pancreatic cancer Neg Hx   . Stomach cancer Neg Hx   . Liver disease Neg Hx    Family Status:  Family Status  Relation Name Status  . Father  Deceased  . Mother  Deceased  . Sister  Alive  . Brother  Alive  . Neg Hx  (Not Specified)    ROS:  Please see the history of present illness.  All other ROS reviewed and negative.     Physical Exam/Data:   Vitals:   05/12/18 0811 05/12/18 0911 05/12/18 1139 05/12/18 1340  BP: (!) 145/93   106/87  Pulse: 84 92 91 91  Resp: (!) 22 (!) 28 (!) 23 (!) 21  Temp: 98.2 F (36.8 C)   98.1 F (36.7 C)  TempSrc: Axillary  Oral  SpO2: 99% 99% 96% 92%  Weight:      Height:        Intake/Output Summary (Last 24 hours) at 05/12/2018 1456 Last data filed at 05/12/2018 1341 Gross per 24 hour  Intake 460 ml  Output 1551 ml  Net -1091 ml   Filed Weights   05/11/18 0104 05/12/18 0642  Weight: (!) 167.4 kg (!) 161.9 kg   Body mass index is 69.72 kg/m.  General:  Well nourished, morbidly obese female, in no acute distress HEENT: normal Lymph: no adenopathy Neck: no JVD seen, difficult to assess secondary to body habitus Endocrine:  No thryomegaly Vascular: No carotid bruits; 4/4 extremity pulses 2+,  without bruits  Cardiac:  normal S1, S2; RRR; 2/6 murmur Lungs: Decreased breath sounds bases but essentially clear bilaterally, no wheezing, rhonchi or rales  Abd: soft, nontender, no hepatomegaly  Ext: Trace lower extremity edema Musculoskeletal:  No deformities, BUE and BLE strength normal and equal Skin: warm and dry  Neuro:  CNs 2-12 intact, no focal abnormalities noted Psych:  Normal affect   EKG:  The EKG was personally reviewed and demonstrates: 12/27 ECG is sinus rhythm, heart rate 97, left bundle noted with QRS 146 milliseconds, QRS duration in 2015 was 124 ms Telemetry:  Telemetry was personally reviewed and demonstrates: Sinus rhythm  Relevant CV Studies:  ECHO: Ordered 05/12/2018 ECHO: 02/11/2013 - Left ventricle: The cavity size was normal. Wall thickness was increased in a pattern of mild LVH. Systolic function was normal. The estimated ejection fraction was in the range of 55% to 65%. Wall motion was normal; there were no regional wall motion abnormalities. Doppler parameters are consistent with abnormal left ventricular relaxation (grade 1 diastolic dysfunction). - Mitral valve: Mild regurgitation. - Atrial septum: No defect or patent foramen ovale was identified. - Pulmonary arteries: PA peak pressure: 61mm Hg (S).  Laboratory Data:  Chemistry Recent Labs  Lab 05/10/18 2037 05/11/18 0310 05/12/18 0410  NA 142 143 142  K 3.2* 3.5 3.6  CL 107 106 99  CO2 28 22 32  GLUCOSE 90 106* 96  BUN 12 12 12   CREATININE 1.10* 1.13* 1.31*  CALCIUM 8.8* 8.8* 9.2  GFRNONAA 55* 54* 45*  GFRAA >60 >60 52*  ANIONGAP 7 15 11     Lab Results  Component Value Date   ALT 14 05/11/2018   AST 25 05/11/2018   ALKPHOS 113 05/11/2018   BILITOT 0.8 05/11/2018   Hematology Recent Labs  Lab 05/10/18 2036 05/11/18 0310 05/12/18 0410  WBC 10.2 12.5* 9.0  RBC 4.49 4.64 4.41  HGB 11.6* 11.9* 11.3*  HCT 38.5 39.6 37.8  MCV 85.7 85.3 85.7  MCH 25.8* 25.6*  25.6*  MCHC 30.1 30.1 29.9*  RDW 14.7 14.8 14.6  PLT 241 208 230   Cardiac Enzymes Recent Labs  Lab 05/11/18 0310 05/11/18 0905 05/11/18 1433 05/12/18 0708 05/12/18 1242  TROPONINI 0.05* 0.06* 0.06* 0.04* 0.05*    Recent Labs  Lab 05/10/18 2053  TROPIPOC 0.07    BNP Recent Labs  Lab 05/10/18 2037  BNP 358.6*     TSH:  Lab Results  Component Value Date   TSH 3.348 05/11/2018   Lipids: Lab Results  Component Value Date   CHOL 117 05/11/2018   HDL 37 (L) 05/11/2018   LDLCALC 73 05/11/2018   TRIG 37 05/11/2018   CHOLHDL 3.2 05/11/2018   HgbA1c: Lab Results  Component Value Date   HGBA1C 5.3 05/11/2018   Magnesium: No results  found for: MG   Radiology/Studies:  Dg Chest 2 View  Result Date: 05/10/2018 CLINICAL DATA:  57 year old female with shortness of breath. EXAM: CHEST - 2 VIEW COMPARISON:  None. FINDINGS: Enlargement of the cardiopericardial silhouette is again noted with pulmonary vascular congestion. There is no evidence of focal airspace disease, pulmonary edema, suspicious pulmonary nodule/mass, definite pleural effusion, or pneumothorax. No acute bony abnormalities are identified. IMPRESSION: Enlargement of the cardiopericardial silhouette again noted with pulmonary vascular congestion. Electronically Signed   By: Margarette Canada M.D.   On: 05/10/2018 14:59    Assessment and Plan:   1.  Acute on chronic diastolic CHF: -Echocardiogram has been performed, results pending.  Hopefully, her EF is still normal.  Per the echo tech, unable to get a TR jet so unable to get PAS - Intake/output is negative by 3.3 L since admission -Weight is down 12 pounds overnight -Creatinine has increased slightly, suspect she may be nearing euvolemia. -Reassess in a.m., continue IV Lasix for now. - RN to discuss with family if there could possibly be some sodium indiscretion, and educate the family on avoiding this.  2.  Morbid obesity: - 07/12/2017 weight was 357 pounds,  then 365 lbs, 369 lbs on admission and now back down to 357 lbs. -Dry weight unclear, continue to follow daily weights.   Active Problems:   CHF (congestive heart failure) (HCC)   Intellectual disability   Acute respiratory failure with hypoxia (HCC)   Obesity hypoventilation syndrome (HCC)   Obstructive sleep apnea     For questions or updates, please contact [facility] Please consult [email or web address] for contact info under Cardiology/STEMI.   Signed, Rosaria Ferries, PA-C  05/12/2018 2:56 PM  I have seen and examined the patient along with Rosaria Ferries, PA-C.  I have reviewed the chart, notes and new data.  I agree with PA's note.  Key new complaints: She feels better, but when asleep was still relatively tachypneic. Key examination changes: Morbid obesity limits her exam.  Hard to say whether she has any edema and it is impossible to see the jugular veins. Key new findings / data: Echo shows a pattern of abnormal relaxation with elevated E/e' ratio consistent with elevated mean left atrial pressure.  Left ventricular systolic function is at the lower limits of normal, without overt regional wall motion abnormalities.  PLAN: We will give 1 more dose of intravenous diuretics Repeat monitoring of renal function in AM. Unfortunately with the limitations imposed by her physical exam it might be a challenge to decide when she is at "dry weight".  Sanda Klein, MD, [place] [phone] 05/12/2018, 3:26 PM

## 2018-05-12 NOTE — Evaluation (Signed)
Physical Therapy Evaluation Patient Details Name: Lauren Wood MRN: [id number] DOB: July 04, 1960 Today's Date: 05/12/2018   History of Present Illness  57 y.o. female presenting with about 1 week of SOB concerning for acute decompensated heart failure. PMH is significant for heart failure, sleep apnea, hypertension, obesity and mental retardation.     Clinical Impression  Pt admitted with above diagnosis. Pt currently with functional limitations due to the deficits listed below (see PT Problem List). PTA pt lived at home with her sister, mod I mobility with cane. She attended a daycare program M-F. Pt primarily mobilizes household distances. On eval, she required min assist bed mobility, min assist transfers, and min assist ambulation 10 feet x 2 with SPC. Pt is currently O2 dependent at 4 L with poor activity tolerance. She did not have home O2 PTA.  Pt will benefit from skilled PT to increase their independence and safety with mobility to allow discharge to the venue listed below.       Follow Up Recommendations Home health PT;Supervision for mobility/OOB    Equipment Recommendations  3in1 (PT)    Recommendations for Other Services       Precautions / Restrictions Precautions Precautions: Fall;Other (comment) Precaution Comments: watch sats      Mobility  Bed Mobility Overal bed mobility: Needs Assistance Bed Mobility: Supine to Sit;Sit to Supine     Supine to sit: Min assist;HOB elevated Sit to supine: Min assist;HOB elevated   General bed mobility comments: +rail, increased time and effort  Transfers Overall transfer level: Needs assistance Equipment used: Straight cane Transfers: Sit to/from Stand Sit to Stand: Min assist         General transfer comment: assist to power up and stabilize balance  Ambulation/Gait Ambulation/Gait assistance: Min assist Gait Distance (Feet): 10 Feet(x 2) Assistive device: Straight cane Gait Pattern/deviations: Wide base of  support;Step-through pattern;Decreased stride length Gait velocity: slow, guarded   General Gait Details: wide BOS due to body habitus. Amb first 39' on RA with desat to 80%. Amb second 10' on 4 L O2 with SpO2 94%. Seated rest break in between trials. Pt notably fatigued upon return to bed.   Stairs            Wheelchair Mobility    Modified Rankin (Stroke Patients Only)       Balance Overall balance assessment: No apparent balance deficits (not formally assessed)                                           Pertinent Vitals/Pain Pain Assessment: No/denies pain    Home Living Family/patient expects to be discharged to:: Private residence Living Arrangements: Other relatives(sister) Available Help at Discharge: Family;Available 24 hours/day Type of Home: Apartment Home Access: Level entry     Home Layout: One level Home Equipment: Walker - 2 wheels;Cane - single point Additional Comments: Pt's sister provided history.     Prior Function Level of Independence: Independent with assistive device(s)         Comments: ambulates with cane. Attends a daycare program M-F.     Hand Dominance        Extremity/Trunk Assessment   Upper Extremity Assessment Upper Extremity Assessment: Generalized weakness    Lower Extremity Assessment Lower Extremity Assessment: Generalized weakness    Cervical / Trunk Assessment Cervical / Trunk Assessment: Normal  Communication   Communication: No difficulties  Cognition Arousal/Alertness: Awake/alert Behavior During Therapy: Flat affect Overall Cognitive Status: History of cognitive impairments - at baseline                                        General Comments General comments (skin integrity, edema, etc.): Pt's sister present during session.    Exercises     Assessment/Plan    PT Assessment Patient needs continued PT services  PT Problem List Decreased  strength;Obesity;Cardiopulmonary status limiting activity;Decreased mobility;Decreased activity tolerance       PT Treatment Interventions Functional mobility training;Patient/family education;Gait training;Therapeutic activities;Therapeutic exercise    PT Goals (Current goals can be found in the Care Plan section)  Acute Rehab PT Goals Patient Stated Goal: home per sister PT Goal Formulation: With patient/family Time For Goal Achievement: 05/26/18 Potential to Achieve Goals: Good    Frequency Min 3X/week   Barriers to discharge        Co-evaluation               AM-PAC PT "6 Clicks" Mobility  Outcome Measure Help needed turning from your back to your side while in a flat bed without using bedrails?: None Help needed moving from lying on your back to sitting on the side of a flat bed without using bedrails?: A Little Help needed moving to and from a bed to a chair (including a wheelchair)?: A Little Help needed standing up from a chair using your arms (e.g., wheelchair or bedside chair)?: A Little Help needed to walk in hospital room?: A Little Help needed climbing 3-5 steps with a railing? : A Lot 6 Click Score: 18    End of Session Equipment Utilized During Treatment: Gait belt;Oxygen Activity Tolerance: Patient limited by fatigue Patient left: in bed;with call bell/phone within reach;with family/visitor present Nurse Communication: Mobility status PT Visit Diagnosis: Difficulty in walking, not elsewhere classified (R26.2)    Time: 8088-1103 PT Time Calculation (min) (ACUTE ONLY): 17 min   Charges:   PT Evaluation $PT Eval Moderate Complexity: 1 Mod          Lorrin Goodell, PT  Office # [phone] Pager [phone]   Lorriane Shire 05/12/2018, 12:26 PM

## 2018-05-13 LAB — CBC WITH DIFFERENTIAL/PLATELET
Abs Immature Granulocytes: 0.04 10*3/uL (ref 0.00–0.07)
Basophils Absolute: 0 10*3/uL (ref 0.0–0.1)
Basophils Relative: 0 %
Eosinophils Absolute: 0.3 10*3/uL (ref 0.0–0.5)
Eosinophils Relative: 3 %
HCT: 38.3 % (ref 36.0–46.0)
Hemoglobin: 11.9 g/dL — ABNORMAL LOW (ref 12.0–15.0)
Immature Granulocytes: 0 %
Lymphocytes Relative: 19 %
Lymphs Abs: 1.8 10*3/uL (ref 0.7–4.0)
MCH: 26.5 pg (ref 26.0–34.0)
MCHC: 31.1 g/dL (ref 30.0–36.0)
MCV: 85.3 fL (ref 80.0–100.0)
Monocytes Absolute: 0.9 10*3/uL (ref 0.1–1.0)
Monocytes Relative: 10 %
Neutro Abs: 6.6 10*3/uL (ref 1.7–7.7)
Neutrophils Relative %: 68 %
Platelets: 246 10*3/uL (ref 150–400)
RBC: 4.49 MIL/uL (ref 3.87–5.11)
RDW: 14.6 % (ref 11.5–15.5)
WBC: 9.7 10*3/uL (ref 4.0–10.5)
nRBC: 0 % (ref 0.0–0.2)

## 2018-05-13 LAB — BASIC METABOLIC PANEL
Anion gap: 12 (ref 5–15)
BUN: 15 mg/dL (ref 6–20)
CO2: 33 mmol/L — ABNORMAL HIGH (ref 22–32)
Calcium: 9.2 mg/dL (ref 8.9–10.3)
Chloride: 95 mmol/L — ABNORMAL LOW (ref 98–111)
Creatinine, Ser: 1.27 mg/dL — ABNORMAL HIGH (ref 0.44–1.00)
GFR calc Af Amer: 54 mL/min — ABNORMAL LOW (ref >60)
GFR calc non Af Amer: 46 mL/min — ABNORMAL LOW (ref >60)
Glucose, Bld: 96 mg/dL (ref 70–99)
Potassium: 3.3 mmol/L — ABNORMAL LOW (ref 3.5–5.1)
Sodium: 140 mmol/L (ref 135–145)

## 2018-05-13 LAB — MAGNESIUM: Magnesium: 1.7 mg/dL (ref 1.7–2.4)

## 2018-05-13 MED ORDER — CARVEDILOL 25 MG PO TABS
25.0000 mg | ORAL_TABLET | Freq: Two times a day (BID) | ORAL | Status: DC
  Administered 2018-05-13 – 2018-05-15 (×5): 25 mg via ORAL
  Filled 2018-05-13 (×5): qty 1

## 2018-05-13 MED ORDER — POTASSIUM CHLORIDE CRYS ER 20 MEQ PO TBCR
40.0000 meq | EXTENDED_RELEASE_TABLET | Freq: Two times a day (BID) | ORAL | Status: AC
  Administered 2018-05-13 (×2): 40 meq via ORAL
  Filled 2018-05-13 (×2): qty 2

## 2018-05-13 NOTE — Care Management Important Message (Signed)
Important Message  Patient Details  Name: Lauren Wood MRN: [id number] Date of Birth: November 25, 1960   Medicare Important Message Given:  Yes    Elowen Debruyn P Odalis Jordan 05/13/2018, 3:26 PM

## 2018-05-13 NOTE — Progress Notes (Signed)
Family Medicine Teaching Service Daily Progress Note Intern Pager: [phone]  Patient name: Lauren Wood Medical record number: [id number] Date of birth: 06/25/1960 Age: 57 y.o. Gender: female  Primary Care Provider: Bufford Lope, DO Consultants: none Code Status: Full code  Pt Overview and Major Events to Date:  Hospital Day 3 Admitted: 05/10/2018   Assessment and Plan: Lauren Wood is a 57 y.o. female presenting with about 1 week of SOB concerning for acute decompensated heart failure. PMH is significant for heart failure, sleep apnea, hypertension, obesity.  Acute hypoxic respiratory failure secondary to acute decompensated heart failure-improving Weight: 875>643(32/95). Net output on 12/29: 1.6 L.  TTE from 12/30 shows LVEF  normal to mildly depressed with hypokinesis of septal and inferior walls, change from 2014.  Cardiac monitoring showed PVCs overnight.  PT/OT recommend home health. -Cardiology following, appreciate recs -Ambulate with pulse ox -Cardiac monitoring -lasix 40 mg PO Daily -Continue spironolactone 25 mg -Strict I's and O's -Daily weights -Supplemental oxygen PRN, wean as tolerated -Tylenol for pain  Hypokalemia-3.3 on 12/30. -Replete potassium PRN -Monitor potassium  -Monitor magnesium  Hypertension-her chronic hypertension is managed with carvedilol 25 mg twice daily, losartan 50 mg once daily, spironolactone 25 mg daily.  SBP is 106-148, DBP is 83-93 in the past 24 hours (12/30).  Holding carvedilol and losartan in the setting of acute decompensated heart failure.  Will restart home medication on discharge. -Hold carvedilol -Hold losartan -Continue spironolactone  CKD stage II-baseline creatinine appears to be around 1.1.  Creatinine on admission is 1.1.  We will continue to monitor the setting of aggressive diuresis. Cr 1.1> 1.13> 1.31>1.27(12/20). -diuresis as above -Holding losartan  -Continue to monitor  Mental retardation, borderline  personality disorder, -Lauren Wood has a number of psychiatric conditions which are managed by behavioral health provider (Dr. Redmond Wood).  Her current medications include bupropion, clonazepam, fluoxetine, mirtazapine, risperidone, trazodone, cogentin. -Continue bupropion -Continue clonazepam -Continue fluoxetine -Continue mirtazapine -Continue risperidone -Continue trazodone -Continue cogentin  GERD-Home medication includes omeprazole 20 mg daily.  She is not currently endorsing any symptoms of GERD. -Protonix 40 mg daily  Sleep apnea-CPAP use at home.  -CPAP ordered  FEN/GI: Protonix, heart healthy/carb modified diet Prophylaxis: Lovenox  Disposition: home  Subjective:  Lauren Wood slept well with her CPAP last night.  She continues to take 4 L nasal cannula.  She reports that she does not typically use oxygen at home.  She currently denies chest pain/stomach pain.  No new complaints today.  Objective: Temp:  [97.2 F (36.2 C)-99 F (37.2 C)] 97.2 F (36.2 C) (12/30 0419) Pulse Rate:  [82-97] 97 (12/30 0419) Resp:  [21-38] 31 (12/30 0419) BP: (106-148)/(83-93) 148/86 (12/30 0419) SpO2:  [92 %-99 %] 95 % (12/30 0419) Weight:  [161.9 kg] 161.9 kg (12/29 0642) Intake/Output 12/29 0701 - 12/30 0700 In: 730 [P.O.:730] Out: 2425 [Urine:2425]  Physical Exam:  General: Alert and cooperative and appears to be in no acute distress.  Severely obese. Cardio: Distant heart sounds, regular rate.   Pulm: Initially breathing via CPAP but transitioned to 4 L nasal cannula during the interview.  Was mildly tachypneic on 4 L nasal cannula.  No crackles noted on auscultation. Abdomen: Bowel sounds normal. Abdomen soft and non-tender.  Extremities: 1+ lower extremity edema in addition to hyperkeratosis with changes due to venous stasis.. Neuro: Cranial nerves grossly intact   Laboratory: Recent Labs  Lab 05/11/18 0310 05/12/18 0410 05/13/18 0307  WBC 12.5* 9.0 9.7  HGB 11.9* 11.3*  11.9*  HCT 39.6 37.8 38.3  PLT 208 230 246   Recent Labs  Lab 05/10/18 2037 05/11/18 0310 05/12/18 0410 05/13/18 0307  NA 142 143 142 140  K 3.2* 3.5 3.6 3.3*  CL 107 106 99 95*  CO2 28 22 32 33*  BUN 12 12 12 15   CREATININE 1.10* 1.13* 1.31* 1.27*  CALCIUM 8.8* 8.8* 9.2 9.2  PROT 6.7 7.0  --   --   BILITOT 0.8 0.8  --   --   ALKPHOS 102 113  --   --   ALT 14 14  --   --   AST 22 25  --   --   GLUCOSE 90 106* 96 96   Imaging/Diagnostic Tests: No results found.   Matilde Haymaker, MD 05/13/2018, 6:00 AM PGY-1, [place] Intern pager: [phone], text pages welcome

## 2018-05-13 NOTE — Progress Notes (Signed)
Progress Note  Patient Name: Lauren Wood Date of Encounter: 05/13/2018  Primary Cardiologist:   Minus Breeding, MD   Subjective   She denies SOB.  No pain.   Inpatient Medications    Scheduled Meds: . aspirin EC  81 mg Oral Daily  . benztropine  0.25 mg Oral BID  . buPROPion  150 mg Oral q morning - 10a  . enoxaparin (LOVENOX) injection  40 mg Subcutaneous Q24H  . FLUoxetine  40 mg Oral q morning - 10a  . furosemide  40 mg Oral Daily  . mirtazapine  30 mg Oral QHS  . pantoprazole  40 mg Oral Daily  . potassium chloride  40 mEq Oral BID  . prazosin  1 mg Oral Daily  . pregabalin  50 mg Oral QHS  . risperiDONE  3 mg Oral BID  . sodium chloride flush  3 mL Intravenous Q12H  . spironolactone  25 mg Oral Daily  . traZODone  100 mg Oral QHS   Continuous Infusions: . sodium chloride     PRN Meds: sodium chloride, acetaminophen, albuterol, docusate sodium, sodium chloride flush   Vital Signs    Vitals:   05/12/18 2118 05/12/18 2300 05/12/18 2312 05/13/18 0419  BP: 129/88 (!) 145/93  (!) 148/86  Pulse: 87 90 92 97  Resp: (!) 38 (!) 26 (!) 26 (!) 31  Temp: 99 F (37.2 C)   (!) 97.2 F (36.2 C)  TempSrc:    Oral  SpO2: 94% 98% 98% 95%  Weight:      Height:        Intake/Output Summary (Last 24 hours) at 05/13/2018 0655 Last data filed at 05/13/2018 0420 Gross per 24 hour  Intake 730 ml  Output 2425 ml  Net -1695 ml   Filed Weights   05/11/18 0104 05/12/18 0642  Weight: (!) 167.4 kg (!) 161.9 kg    Telemetry    Sinus tach with PVCs and triplets - Personally Reviewed  ECG    NA - Personally Reviewed  Physical Exam   GEN: No acute distress.   Neck: No  JVD Cardiac: RRR, no murmurs, rubs, or gallops.  Respiratory: Clear  to auscultation bilaterally. GI: Soft, nontender, non-distended  MS: No  edema; No deformity. Neuro:  Nonfocal  Psych: Normal affect   Labs    Chemistry Recent Labs  Lab 05/10/18 2037 05/11/18 0310 05/12/18 0410  05/13/18 0307  NA 142 143 142 140  K 3.2* 3.5 3.6 3.3*  CL 107 106 99 95*  CO2 28 22 32 33*  GLUCOSE 90 106* 96 96  BUN 12 12 12 15   CREATININE 1.10* 1.13* 1.31* 1.27*  CALCIUM 8.8* 8.8* 9.2 9.2  PROT 6.7 7.0  --   --   ALBUMIN 3.0* 3.1*  --   --   AST 22 25  --   --   ALT 14 14  --   --   ALKPHOS 102 113  --   --   BILITOT 0.8 0.8  --   --   GFRNONAA 55* 54* 45* 46*  GFRAA >60 >60 52* 54*  ANIONGAP 7 15 11 12      Hematology Recent Labs  Lab 05/11/18 0310 05/12/18 0410 05/13/18 0307  WBC 12.5* 9.0 9.7  RBC 4.64 4.41 4.49  HGB 11.9* 11.3* 11.9*  HCT 39.6 37.8 38.3  MCV 85.3 85.7 85.3  MCH 25.6* 25.6* 26.5  MCHC 30.1 29.9* 31.1  RDW 14.8 14.6 14.6  PLT 208  230 246    Cardiac Enzymes Recent Labs  Lab 05/11/18 1433 05/12/18 0708 05/12/18 1242 05/12/18 1828  TROPONINI 0.06* 0.04* 0.05* 0.05*    Recent Labs  Lab 05/10/18 2053  TROPIPOC 0.07     BNP Recent Labs  Lab 05/10/18 2037  BNP 358.6*     DDimer No results for input(s): DDIMER in the last 168 hours.   Radiology    No results found.  Cardiac Studies   Echo:    Study Conclusions  - Left ventricle: Difficult study even with use of Definity LVEF is   normal to mildly depressed with hypokinesis of the inferior and   septal walls Septal wall motion may reflect conduction delay.   OVerall these changes are different when compared to echo report   from 2014.  Patient Profile     57 y.o. female with a hx of morbid obesity, OSA on CPAP, gout, CKD stage III, mental retardation, colon polyps, HOH, who is being seen today for the evaluation of acute on chronic diastolic CHF at the request of Dr. Andria Frames.  Assessment & Plan    ACUTE ON CHRONIC DIASTOLIC HF:   Negative 5 liters this admission.   Today started oral diuretic.   No further IV indicated.  Check sats with ambulation off of O2.   CKD:  Creat is stable.   Follow as an outpatient.   HTN:  It does not look like she has been on her  Cozaar or her beta blocker since admission.   I will restart the beta blocker at there previous dose.      For questions or updates, please contact [place] Please consult [email or web address] for contact info under Cardiology/STEMI.   Signed, Minus Breeding, MD  05/13/2018, 6:55 AM

## 2018-05-13 NOTE — Progress Notes (Signed)
EKG on monitor: sinus rhythm with frequent unifocal PVCs and at some  short non-sustain V-tach. BP stable, she was sleeping asymptomatically. On CPAP at night. No sign of distress. Will continue to monitor.  Kennyth Lose, BSN, RN, [place] [facility]

## 2018-05-13 NOTE — Evaluation (Signed)
Occupational Therapy Evaluation Patient Details Name: Lauren Wood MRN: [id number] DOB: [birth year] Today's Date: 05/13/2018    History of Present Illness 57 y.o. female presenting with about 1 week of SOB concerning for acute decompensated heart failure. PMH is significant for heart failure, sleep apnea, hypertension, obesity and mental retardation.    Clinical Impression   PTA Pt reports that she was mod I for ADL and transfers with [facility]. Currently the patient is min A for boost and balance with transfers assisted by [facility]. Pt has decreased balance, activity tolerance, and access to LB for dressing, and requires increased assist for bathing. Educated Pt on shower safety - using DME from a safety and energy conservation standpoint. Pt will require skilled OT in the acute setting as well as afterwards at the [facility] level. Next session to focus on AE for LB ADL and energy conservation.     Follow Up Recommendations  Home health OT;Supervision/Assistance - 24 hour    Equipment Recommendations  Tub/shower bench    Recommendations for Other Services       Precautions / Restrictions Precautions Precautions: Fall;Other (comment) Precaution Comments: watch sats Restrictions Weight Bearing Restrictions: No      Mobility Bed Mobility               General bed mobility comments: Pt OOB in recliner at beginning and end of session  Transfers Overall transfer level: Needs assistance Equipment used: Straight cane Transfers: Sit to/from Stand(x5) Sit to Stand: Min assist         General transfer comment: assist to power up and stabilize balance. performed x5    Balance Overall balance assessment: No apparent balance deficits (not formally assessed)                                         ADL either performed or assessed with clinical judgement   ADL Overall ADL's : Needs assistance/impaired Eating/Feeding: Independent;Sitting   Grooming: Set  up;Sitting;Wash/dry face;Oral care Grooming Details (indicate cue type and reason): "I always do this sitting down" Upper Body Bathing: Minimal assistance;Sitting Upper Body Bathing Details (indicate cue type and reason): assist for back Lower Body Bathing: Minimal assistance   Upper Body Dressing : Set up   Lower Body Dressing: Moderate assistance;Sit to/from stand   Toilet Transfer: Minimal assistance;Ambulation;BSC(SPC) Toilet Transfer Details (indicate cue type and reason): short distance to bari [facility] Toileting- Clothing Manipulation and Hygiene: Moderate assistance;Sit to/from stand       Functional mobility during ADLs: Min guard;Minimal assistance(SPC) General ADL Comments: Pt with decreased access to LB for ADL from baseline. and requires increased assist for transfers (generally min A)     Vision Patient Visual Report: No change from baseline       Perception     Praxis      Pertinent Vitals/Pain Pain Assessment: No/denies pain     Hand Dominance     Extremity/Trunk Assessment Upper Extremity Assessment Upper Extremity Assessment: Generalized weakness   Lower Extremity Assessment Lower Extremity Assessment: Generalized weakness   Cervical / Trunk Assessment Cervical / Trunk Assessment: Other exceptions Cervical / Trunk Exceptions: obesity   Communication Communication Communication: No difficulties   Cognition Arousal/Alertness: Awake/alert Behavior During Therapy: Flat affect Overall Cognitive Status: History of cognitive impairments - at baseline  General Comments: very pleasant throughout   General Comments  no family present during session    Exercises     Shoulder Instructions      Home Living Family/patient expects to be discharged to:: Private residence Living Arrangements: Other relatives(sister) Available Help at Discharge: Family;Available 24 hours/day Type of Home: Apartment Home Access:  Level entry     Home Layout: One level     Bathroom Shower/Tub: Teacher, early years/pre: Standard Bathroom Accessibility: Yes   Home Equipment: Walker - 2 wheels;Cane - single point   Additional Comments: history gleaned from PT evaluation      Prior Functioning/Environment Level of Independence: Independent with assistive device(s)        Comments: ambulates with cane. Attends a daycare program M-F.        OT Problem List: Decreased activity tolerance;Impaired balance (sitting and/or standing);Decreased safety awareness;Cardiopulmonary status limiting activity;Obesity;Increased edema      OT Treatment/Interventions: Self-care/ADL training;Energy conservation;DME and/or AE instruction;Therapeutic activities;Patient/family education;Balance training    OT Goals(Current goals can be found in the care plan section) Acute Rehab OT Goals Patient Stated Goal: home per sister OT Goal Formulation: With patient Time For Goal Achievement: 05/27/18 Potential to Achieve Goals: Good ADL Goals Pt Will Perform Upper Body Dressing: with set-up;sitting Pt Will Perform Lower Body Dressing: with set-up;sit to/from stand Pt Will Transfer to Toilet: with supervision;ambulating Pt Will Perform Toileting - Clothing Manipulation and hygiene: with supervision;sit to/from stand  OT Frequency: Min 2X/week   Barriers to D/C:            Co-evaluation              AM-PAC OT "6 Clicks" Daily Activity     Outcome Measure Help from another person eating meals?: None Help from another person taking care of personal grooming?: A Little Help from another person toileting, which includes using toliet, bedpan, or urinal?: A Little Help from another person bathing (including washing, rinsing, drying)?: A Lot Help from another person to put on and taking off regular upper body clothing?: None Help from another person to put on and taking off regular lower body clothing?: A Lot 6  Click Score: 18   End of Session Equipment Utilized During Treatment: Gait belt(straight cane) Nurse Communication: Mobility status;Precautions  Activity Tolerance: Patient tolerated treatment well Patient left: in chair;with call bell/phone within reach  OT Visit Diagnosis: Unsteadiness on feet (R26.81);Muscle weakness (generalized) (M62.81)                Time: 0569-7948 OT Time Calculation (min): 16 min Charges:  OT General Charges $OT Visit: 1 Visit OT Evaluation $OT Eval Moderate Complexity: [place] OTR/L Acute Rehabilitation Services Pager: [phone] Office: [place] 05/13/2018, 1:42 PM

## 2018-05-14 LAB — BLOOD GAS, ARTERIAL
Acid-Base Excess: 8.1 mmol/L — ABNORMAL HIGH (ref 0.0–2.0)
Bicarbonate: 32.5 mmol/L — ABNORMAL HIGH (ref 20.0–28.0)
Drawn by: 441371
O2 Content: 2 L/min
O2 Saturation: 88.6 %
Patient temperature: 98.7
pCO2 arterial: 48.7 mmHg — ABNORMAL HIGH (ref 32.0–48.0)
pH, Arterial: 7.44 (ref 7.350–7.450)
pO2, Arterial: 58.3 mmHg — ABNORMAL LOW (ref 83.0–108.0)

## 2018-05-14 LAB — BASIC METABOLIC PANEL
Anion gap: 10 (ref 5–15)
BUN: 17 mg/dL (ref 6–20)
CO2: 32 mmol/L (ref 22–32)
Calcium: 9.3 mg/dL (ref 8.9–10.3)
Chloride: 98 mmol/L (ref 98–111)
Creatinine, Ser: 1.32 mg/dL — ABNORMAL HIGH (ref 0.44–1.00)
GFR calc Af Amer: 51 mL/min — ABNORMAL LOW (ref >60)
GFR calc non Af Amer: 44 mL/min — ABNORMAL LOW (ref >60)
Glucose, Bld: 88 mg/dL (ref 70–99)
Potassium: 4.3 mmol/L (ref 3.5–5.1)
Sodium: 140 mmol/L (ref 135–145)

## 2018-05-14 LAB — CBC
HCT: 40.4 % (ref 36.0–46.0)
Hemoglobin: 12.3 g/dL (ref 12.0–15.0)
MCH: 25.8 pg — ABNORMAL LOW (ref 26.0–34.0)
MCHC: 30.4 g/dL (ref 30.0–36.0)
MCV: 84.9 fL (ref 80.0–100.0)
Platelets: 244 10*3/uL (ref 150–400)
RBC: 4.76 MIL/uL (ref 3.87–5.11)
RDW: 14.5 % (ref 11.5–15.5)
WBC: 8.8 10*3/uL (ref 4.0–10.5)
nRBC: 0 % (ref 0.0–0.2)

## 2018-05-14 MED ORDER — ENOXAPARIN SODIUM 80 MG/0.8ML ~~LOC~~ SOLN
0.5000 mg/kg | SUBCUTANEOUS | Status: DC
  Administered 2018-05-15: 80 mg via SUBCUTANEOUS
  Filled 2018-05-14: qty 0.8

## 2018-05-14 NOTE — Progress Notes (Signed)
Family Medicine Teaching Service Daily Progress Note Intern Pager: [phone]  Patient name: Lauren Wood Medical record number: [id number] Date of birth: 10-22-1960 Age: 57 y.o. Gender: female  Primary Care Provider: Bufford Lope, DO Consultants: none Code Status: Full code  Pt Overview and Major Events to Date:  Hospital Day 4 Admitted: 05/10/2018   Assessment and Plan: Lauren Wood is a 57 y.o. female presenting with about 1 week of SOB concerning for acute decompensated heart failure. PMH is significant for heart failure, sleep apnea, hypertension, obesity.  Acute hypoxic respiratory failure secondary to acute decompensated heart failure-improving Weight: 242>353(61/44)>315.4(00/86)>761(95/09). Net output from admisson: 6.07 L.  TTE from 12/30 shows LVEF  normal to mildly depressed with hypokinesis of septal and inferior walls, change from 2014. PT/OT recommend home health. -Cardiology following, appreciate recs -Ambulate with pulse ox -Cardiac monitoring -lasix 40 mg PO Daily -Continue spironolactone 25 mg -Strict I's and O's -Daily weights -Supplemental oxygen PRN, wean as tolerated -Tylenol for pain  Hypokalemia-3.3 on 12/30. -Replete potassium PRN -Monitor potassium  -Monitor magnesium  Hypertension-her chronic hypertension is managed with carvedilol 25 mg twice daily, losartan 50 mg once daily, spironolactone 25 mg daily.  SBP 120-140, DBP 86-93 in the past 24 hours (12/31).  Carvedilol restarted 12/30, losartan held. -Continue carvedilol -Hold losartan -Continue spironolactone  CKD stage II-baseline creatinine appears to be around 1.1.  Creatinine on admission is 1.1.  We will continue to monitor the setting of aggressive diuresis. Cr 1.1> 1.13> 1.31>1.27(12/30)>1.32(12/31). -diuresis as above -Holding losartan  -Continue to monitor  Mental retardation, borderline personality disorder, -Lauren Wood has a number of psychiatric conditions which are managed by  behavioral health provider (Dr. Redmond Wood).  Her current medications include bupropion, clonazepam, fluoxetine, mirtazapine, risperidone, trazodone, cogentin. -Continue bupropion -Continue clonazepam -Continue fluoxetine -Continue mirtazapine -Continue risperidone -Continue trazodone -Continue cogentin  GERD-Home medication includes omeprazole 20 mg daily.  She is not currently endorsing any symptoms of GERD. -Protonix 40 mg daily  Sleep apnea-CPAP use at home.  -CPAP ordered  FEN/GI: Protonix, heart healthy/carb modified diet Prophylaxis: Lovenox  Disposition: home  Subjective:  Upon entering the room patient was wearing her CPAP mask which is not attached to the CPAP machine.  She is breathing comfortably on room air her sats were low 90s to high 80s.  We continued her interview for some time while she breathes comfortably on room air though her sats remained in the high 80s to low 90s.  Ultimately she was put on 2 L nasal cannula.  She reported feeling well this morning and denied chest pain, stomach pain, headaches.  Objective: Temp:  [97.7 F (36.5 C)-99.1 F (37.3 C)] 97.7 F (36.5 C) (12/31 0539) Pulse Rate:  [77-90] 90 (12/31 0539) Resp:  [16-28] 20 (12/31 0539) BP: (126-146)/(86-93) 136/87 (12/31 0539) SpO2:  [94 %-97 %] 95 % (12/31 0539) Weight:  [159 kg-159.5 kg] 159.5 kg (12/31 0539) Intake/Output 12/30 0701 - 12/31 0700 In: -  Out: 1000 [Urine:1000]  Physical Exam:  General: Alert and cooperative and appears to be in no acute distress.  Resting comfortably in bed.  Severely obese. HEENT: Neck non-tender without lymphadenopathy, masses or thyromegaly Cardio: Normal A1 and S2, no S3 or S4. Rhythm is regular. No murmurs or rubs.   Pulm: Clear to auscultation bilaterally, no crackles, wheezing, or diminished breath sounds. Normal respiratory effort Abdomen: Bowel sounds normal. Abdomen soft and non-tender.  Extremities: LE edema decreased from admission.   Continues to be 1+ pitting edema.  Warm, dry. Neuro: Cranial nerves grossly intact  Laboratory: Recent Labs  Lab 05/11/18 0310 05/12/18 0410 05/13/18 0307  WBC 12.5* 9.0 9.7  HGB 11.9* 11.3* 11.9*  HCT 39.6 37.8 38.3  PLT 208 230 246   Recent Labs  Lab 05/10/18 2037 05/11/18 0310 05/12/18 0410 05/13/18 0307  NA 142 143 142 140  K 3.2* 3.5 3.6 3.3*  CL 107 106 99 95*  CO2 28 22 32 33*  BUN 12 12 12 15   CREATININE 1.10* 1.13* 1.31* 1.27*  CALCIUM 8.8* 8.8* 9.2 9.2  PROT 6.7 7.0  --   --   BILITOT 0.8 0.8  --   --   ALKPHOS 102 113  --   --   ALT 14 14  --   --   AST 22 25  --   --   GLUCOSE 90 106* 96 96   Imaging/Diagnostic Tests: No results found.   Lauren Haymaker, MD 05/14/2018, 5:45 AM PGY-1, [place] Intern pager: [phone], text pages welcome

## 2018-05-14 NOTE — Progress Notes (Signed)
Progress Note  Patient Name: Lauren Wood Date of Encounter: 05/14/2018  Primary Cardiologist:   Minus Breeding, MD   Subjective   No chest pain.  Breathing OK.    Inpatient Medications    Scheduled Meds: . aspirin EC  81 mg Oral Daily  . benztropine  0.25 mg Oral BID  . buPROPion  150 mg Oral q morning - 10a  . carvedilol  25 mg Oral BID WC  . enoxaparin (LOVENOX) injection  40 mg Subcutaneous Q24H  . FLUoxetine  40 mg Oral q morning - 10a  . furosemide  40 mg Oral Daily  . mirtazapine  30 mg Oral QHS  . pantoprazole  40 mg Oral Daily  . prazosin  1 mg Oral Daily  . pregabalin  50 mg Oral QHS  . risperiDONE  3 mg Oral BID  . sodium chloride flush  3 mL Intravenous Q12H  . spironolactone  25 mg Oral Daily  . traZODone  100 mg Oral QHS   Continuous Infusions: . sodium chloride     PRN Meds: sodium chloride, acetaminophen, docusate sodium, sodium chloride flush   Vital Signs    Vitals:   05/13/18 1107 05/13/18 1747 05/13/18 2111 05/14/18 0539  BP: (!) 146/93 126/86  136/87  Pulse:  86 90 90  Resp: 17  16 20   Temp: 99.1 F (37.3 C)   97.7 F (36.5 C)  TempSrc: Oral   Axillary  SpO2:   97% 95%  Weight:    (!) 159.5 kg  Height:        Intake/Output Summary (Last 24 hours) at 05/14/2018 0906 Last data filed at 05/14/2018 0540 Gross per 24 hour  Intake -  Output 1000 ml  Net -1000 ml   Filed Weights   05/12/18 1610 05/13/18 0657 05/14/18 0539  Weight: (!) 161.9 kg (!) 159 kg (!) 159.5 kg    Telemetry    NSR, PVCs - Personally Reviewed  ECG    NA - Personally Reviewed  Physical Exam   GEN: No  acute distress.   Neck: No  JVD (dfficult to assess) Cardiac: RRR, no murmurs, rubs, or gallops.  Respiratory: Clear   to auscultation bilaterally. GI: Soft, nontender, non-distended, normal bowel sounds  MS:  No edema; No deformity. Neuro:   Nonfocal  Psych: Oriented and appropriate    Labs    Chemistry Recent Labs  Lab 05/10/18 2037  05/11/18 0310 05/12/18 0410 05/13/18 0307 05/14/18 0609  NA 142 143 142 140 140  K 3.2* 3.5 3.6 3.3* 4.3  CL 107 106 99 95* 98  CO2 28 22 32 33* 32  GLUCOSE 90 106* 96 96 88  BUN 12 12 12 15 17   CREATININE 1.10* 1.13* 1.31* 1.27* 1.32*  CALCIUM 8.8* 8.8* 9.2 9.2 9.3  PROT 6.7 7.0  --   --   --   ALBUMIN 3.0* 3.1*  --   --   --   AST 22 25  --   --   --   ALT 14 14  --   --   --   ALKPHOS 102 113  --   --   --   BILITOT 0.8 0.8  --   --   --   GFRNONAA 55* 54* 45* 46* 44*  GFRAA >60 >60 52* 54* 51*  ANIONGAP 7 15 11 12 10      Hematology Recent Labs  Lab 05/12/18 0410 05/13/18 0307 05/14/18 0609  WBC 9.0 9.7 8.8  RBC 4.41 4.49 4.76  HGB 11.3* 11.9* 12.3  HCT 37.8 38.3 40.4  MCV 85.7 85.3 84.9  MCH 25.6* 26.5 25.8*  MCHC 29.9* 31.1 30.4  RDW 14.6 14.6 14.5  PLT 230 246 244    Cardiac Enzymes Recent Labs  Lab 05/11/18 1433 05/12/18 0708 05/12/18 1242 05/12/18 1828  TROPONINI 0.06* 0.04* 0.05* 0.05*    Recent Labs  Lab 05/10/18 2053  TROPIPOC 0.07     BNP Recent Labs  Lab 05/10/18 2037  BNP 358.6*     DDimer No results for input(s): DDIMER in the last 168 hours.   Radiology    No results found.  Cardiac Studies   Echo:    Study Conclusions  - Left ventricle: Difficult study even with use of Definity LVEF is   normal to mildly depressed with hypokinesis of the inferior and   septal walls Septal wall motion may reflect conduction delay.   OVerall these changes are different when compared to echo report   from 2014.  Patient Profile     57 y.o. female with a hx of morbid obesity, OSA on CPAP, gout, CKD stage III, mental retardation, colon polyps, HOH, who is being seen today for the evaluation of acute on chronic diastolic CHF at the request of Dr. Andria Frames.  Assessment & Plan    ACUTE ON CHRONIC DIASTOLIC HF:   Negative 6 liters this admission.  On PO diuretic.  Continue current therapy.  CKD:  Creat is up slightly.  Avoiding ARB for  now.    HTN:  Restarted beta blocker yesterday.    [facility] will sign off.   Medication Recommendations:  Meds as on MAR Other recommendations (labs, testing, etc):  NA Follow up as an outpatient:  We will arrange one month follow up in our office.     Signed, Minus Breeding, MD  05/14/2018, 9:06 AM

## 2018-05-14 NOTE — Progress Notes (Signed)
SATURATION QUALIFICATIONS: (This note is used to comply with regulatory documentation for home oxygen)  Patient Saturations on Room Air at Rest = 92-94%  Patient Saturations on Room Air while Ambulating = 89-91%  Patient Saturations on 0 Liters of oxygen while Ambulating = 89-91%  Patient walked with [facility], PT and maintained oxygen saturations between 89-91% while ambulating on room air with rollator (see Cary's note). Supplemental oxygen not required.

## 2018-05-14 NOTE — Progress Notes (Signed)
Physical Therapy Treatment Patient Details Name: Lauren Wood MRN: [id number] DOB: 07-13-1960 Today's Date: 05/14/2018    History of Present Illness 57 y.o. female presenting with about 1 week of SOB concerning for acute decompensated heart failure. PMH is significant for heart failure, sleep apnea, hypertension, obesity and mental retardation.     PT Comments    Pt making good progress. Amb on RA with SpO2 at 89-91%. Pt moved more confidently with rollator than cane.    Follow Up Recommendations  Home health PT;Supervision for mobility/OOB     Equipment Recommendations  Other (comment)(possibly bariatric rollator)    Recommendations for Other Services       Precautions / Restrictions Precautions Precautions: Fall;Other (comment) Precaution Comments: watch sats Restrictions Weight Bearing Restrictions: No    Mobility  Bed Mobility               General bed mobility comments: Pt up in recliner  Transfers Overall transfer level: Needs assistance Equipment used: Straight cane;4-wheeled walker Transfers: Sit to/from Stand Sit to Stand: Min guard         General transfer comment: assist for safety  Ambulation/Gait Ambulation/Gait assistance: Min guard Gait Distance (Feet): 30 Feet(x 2) Assistive device: Straight cane;4-wheeled walker Gait Pattern/deviations: Wide base of support;Step-through pattern;Decreased stride length Gait velocity: decr Gait velocity interpretation: 1.31 - 2.62 ft/sec, indicative of limited community ambulator General Gait Details: Assist for safety. Pt amb initially with straight cane and then used bariatric rollator. Pt amb on RA with SpO2 89-91%. Less guarded gait with rollator   Stairs             Wheelchair Mobility    Modified Rankin (Stroke Patients Only)       Balance Overall balance assessment: Mild deficits observed, not formally tested                                          Cognition  Arousal/Alertness: Awake/alert Behavior During Therapy: Flat affect Overall Cognitive Status: History of cognitive impairments - at baseline                                        Exercises      General Comments        Pertinent Vitals/Pain Pain Assessment: No/denies pain    Home Living                      Prior Function            PT Goals (current goals can now be found in the care plan section) Progress towards PT goals: Progressing toward goals    Frequency    Min 3X/week      PT Plan Current plan remains appropriate    Co-evaluation              AM-PAC PT "6 Clicks" Mobility   Outcome Measure  Help needed turning from your back to your side while in a flat bed without using bedrails?: None Help needed moving from lying on your back to sitting on the side of a flat bed without using bedrails?: A Little Help needed moving to and from a bed to a chair (including a wheelchair)?: A Little Help needed standing up from a chair using your arms (e.g.,  wheelchair or bedside chair)?: A Little Help needed to walk in hospital room?: A Little Help needed climbing 3-5 steps with a railing? : A Lot 6 Click Score: 18    End of Session   Activity Tolerance: Patient tolerated treatment well Patient left: with call bell/phone within reach;in chair Nurse Communication: Mobility status;Other (comment)(Left O2 off and SpO2 monitor on) PT Visit Diagnosis: Difficulty in walking, not elsewhere classified (R26.2)     Time: 5183-3582 PT Time Calculation (min) (ACUTE ONLY): 21 min  Charges:  $Gait Training: 8-22 mins                     [place] Pager [phone] Office [place] 05/14/2018, 1:57 PM

## 2018-05-15 DIAGNOSIS — I10 Essential (primary) hypertension: Secondary | ICD-10-CM

## 2018-05-15 DIAGNOSIS — N183 Chronic kidney disease, stage 3 (moderate): Secondary | ICD-10-CM

## 2018-05-15 LAB — BASIC METABOLIC PANEL
Anion gap: 9 (ref 5–15)
BUN: 21 mg/dL — ABNORMAL HIGH (ref 6–20)
CO2: 36 mmol/L — ABNORMAL HIGH (ref 22–32)
Calcium: 9.6 mg/dL (ref 8.9–10.3)
Chloride: 95 mmol/L — ABNORMAL LOW (ref 98–111)
Creatinine, Ser: 1.38 mg/dL — ABNORMAL HIGH (ref 0.44–1.00)
GFR calc Af Amer: 49 mL/min — ABNORMAL LOW (ref >60)
GFR calc non Af Amer: 42 mL/min — ABNORMAL LOW (ref >60)
Glucose, Bld: 86 mg/dL (ref 70–99)
Potassium: 4 mmol/L (ref 3.5–5.1)
Sodium: 140 mmol/L (ref 135–145)

## 2018-05-15 LAB — CBC
HCT: 39.8 % (ref 36.0–46.0)
Hemoglobin: 11.9 g/dL — ABNORMAL LOW (ref 12.0–15.0)
MCH: 25.5 pg — ABNORMAL LOW (ref 26.0–34.0)
MCHC: 29.9 g/dL — ABNORMAL LOW (ref 30.0–36.0)
MCV: 85.2 fL (ref 80.0–100.0)
Platelets: 239 10*3/uL (ref 150–400)
RBC: 4.67 MIL/uL (ref 3.87–5.11)
RDW: 14.5 % (ref 11.5–15.5)
WBC: 8.9 10*3/uL (ref 4.0–10.5)
nRBC: 0 % (ref 0.0–0.2)

## 2018-05-15 MED ORDER — FUROSEMIDE 40 MG PO TABS: 40.0000 mg | ORAL_TABLET | Freq: Every day | ORAL | 0 refills | Status: DC

## 2018-05-15 NOTE — Care Management Note (Signed)
Case Management Note Marvetta Gibbons RN, BSN Transitions of Care Unit 4E- RN Case Manager [phone]  Patient Details  Name: Lauren Wood MRN: [id number] Date of Birth: 1961-03-21  Subjective/Objective:   Pt admitted with CHF                 Action/Plan: PTA pt lived at home with sister, orders placed for HHPT/OT- spoke with pt and provided list per CMS website with star ratings- per pt she request to check with her sister [name] for choice- call made to Arlene at [phone] for choice- per [facility] was selected for [facility] needs from [employer] list provided. Pt has walker at home, no further needs noted.   Expected Discharge Date:  05/15/18               Expected Discharge Plan:  [place]  In-House Referral:     Discharge planning Services  CM Consult  Post Acute Care Choice:  Home Health Choice offered to:  Sibling  DME Arranged:    DME Agency:     HH Arranged:  PT, OT [place] Agency:  [place]  Status of Service:  Completed, signed off  If discussed at [place] of Stay Meetings, dates discussed:    Discharge Disposition: home/home health   Additional Comments:  Dawayne Patricia, RN 05/15/2018, 2:04 PM

## 2018-05-15 NOTE — Progress Notes (Signed)
Family Medicine Teaching Service Daily Progress Note Intern Pager: [phone]  Patient name: Lauren Wood Medical record number: [id number] Date of birth: 03-Jan-1961 Age: 58 y.o. Gender: female  Primary Care Provider: Bufford Lope, DO Consultants: none Code Status: Full code  Pt Overview and Major Events to Date:  Hospital Day 5 Admitted: 05/10/2018   Assessment and Plan: Lauren Wood is a 58 y.o. female presenting with about 1 week of SOB concerning for acute decompensated heart failure. PMH is significant for heart failure, sleep apnea, hypertension, obesity.  Acute hypoxic respiratory failure secondary to acute decompensated heart failure - Resolved Weight: 369>357(12/29)>350.5(12/30)>351(12/31)>350.1(1/1). Net output from admisson: 6.7 L.  ABG on 12/31 showed pH 7.44, PCO2 48, PO2 58.  Consistent with CO2 retention.  TTE from 12/30 shows LVEF  normal to mildly depressed with hypokinesis of septal and inferior walls, change from 2014. PT/OT recommend home health. -Cardiology following, appreciate recs -Cardiac monitoring -lasix 40 mg PO Daily -Continue spironolactone 25 mg -Strict I's and O's -Daily weights -Tylenol for pain  Hypokalemia-3.3 on 12/30. -Replete potassium PRN -Monitor potassium  -Monitor magnesium  Hypertension-her chronic hypertension is managed with carvedilol 25 mg twice daily, losartan 50 mg once daily, spironolactone 25 mg daily.  SBP 120-140, DBP 86-93 in the past 24 hours (12/31).  Carvedilol restarted 12/30, losartan held. -Continue carvedilol -Hold losartan -Continue spironolactone  CKD stage II-baseline creatinine appears to be around 1.1.  Creatinine on admission is 1.1.  We will continue to monitor the setting of aggressive diuresis. Cr 1.1> 1.13> 1.31>1.27(12/30)>1.32(12/31)>1.38. -diuresis as above -Holding losartan  -Continue to monitor  Mental retardation, borderline personality disorder, -Ms. Brune has a number of psychiatric  conditions which are managed by behavioral health provider (Dr. Redmond Baseman).  Her current medications include bupropion, clonazepam, fluoxetine, mirtazapine, risperidone, trazodone, cogentin. -Continue bupropion -Continue clonazepam -Continue fluoxetine -Continue mirtazapine -Continue risperidone -Continue trazodone -Continue cogentin  GERD-Home medication includes omeprazole 20 mg daily.  She is not currently endorsing any symptoms of GERD. -Protonix 40 mg daily  Sleep apnea-CPAP use at home.  -CPAP ordered  FEN/GI: Protonix, heart healthy/carb modified diet Prophylaxis: Lovenox  Disposition: home  Subjective:  Patient seen this morning seated comfortably on the side of her bed.  She was breathing comfortably on room air at the time of the interview.  She had no new complaints this morning.  She denied headache, chest pain, stomach pain.    Objective: Temp:  [97.6 F (36.4 C)-99 F (37.2 C)] 97.6 F (36.4 C) (01/01 0405) Pulse Rate:  [69-82] 69 (01/01 0405) Resp:  [15-34] 26 (12/31 2020) BP: (112-140)/(64-91) 119/64 (01/01 0405) SpO2:  [90 %-98 %] 95 % (01/01 0405) Intake/Output 12/31 0701 - 01/01 0700 In: 6 [P.O.:810] Out: 1450 [Urine:1450]  Physical Exam:  General: Alert and cooperative and appears to be in no acute distress.  Severely obese woman. Cardio: Distant heart sounds.  Radial pulse.   Pulm: Clear to auscultation bilaterally, no crackles, wheezing, or diminished breath sounds. Normal respiratory effort Abdomen: Bowel sounds normal. Abdomen soft and non-tender.  Extremities: 1+ pitting edema in lower extremities.  Thick scaled hyperpigmented skin in lower extremities. Neuro: Cranial nerves grossly intact  Laboratory: Recent Labs  Lab 05/12/18 0410 05/13/18 0307 05/14/18 0609  WBC 9.0 9.7 8.8  HGB 11.3* 11.9* 12.3  HCT 37.8 38.3 40.4  PLT 230 246 244   Recent Labs  Lab 05/10/18 2037 05/11/18 0310 05/12/18 0410 05/13/18 0307 05/14/18 0609  NA  142 143 142 140 140  K 3.2* 3.5 3.6 3.3* 4.3  CL 107 106 99 95* 98  CO2 28 22 32 33* 32  BUN 12 12 12 15 17   CREATININE 1.10* 1.13* 1.31* 1.27* 1.32*  CALCIUM 8.8* 8.8* 9.2 9.2 9.3  PROT 6.7 7.0  --   --   --   BILITOT 0.8 0.8  --   --   --   ALKPHOS 102 113  --   --   --   ALT 14 14  --   --   --   AST 22 25  --   --   --   GLUCOSE 90 106* 96 96 88   Imaging/Diagnostic Tests: No results found.   Matilde Haymaker, MD 05/15/2018, 5:59 AM PGY-1, [place] Intern pager: [phone], text pages welcome

## 2018-05-16 ENCOUNTER — Telehealth: Payer: Self-pay | Admitting: Family Medicine

## 2018-05-16 NOTE — Telephone Encounter (Signed)
Lauren Wood with [facility] calling for verbal orders for home heath PT. Frequency of once a week for 4 weeks and then once every other week for 4 weeks. This is for lower extremity strength and gate balance. Please call Lauren Wood with verbal orders at [phone]. Please advise

## 2018-05-16 NOTE — Telephone Encounter (Signed)
Will forward to MD to give verbal ok.  Aniceto Kyser,CMA

## 2018-05-20 ENCOUNTER — Other Ambulatory Visit: Payer: Self-pay

## 2018-05-20 ENCOUNTER — Ambulatory Visit (INDEPENDENT_AMBULATORY_CARE_PROVIDER_SITE_OTHER): Payer: Medicare Other | Admitting: Family Medicine

## 2018-05-20 ENCOUNTER — Encounter: Payer: Self-pay | Admitting: Family Medicine

## 2018-05-20 ENCOUNTER — Telehealth: Payer: Self-pay

## 2018-05-20 VITALS — BP 130/78 | HR 76 | Temp 98.4°F | Ht 64.0 in | Wt 360.0 lb

## 2018-05-20 DIAGNOSIS — I5032 Chronic diastolic (congestive) heart failure: Secondary | ICD-10-CM

## 2018-05-20 DIAGNOSIS — G4733 Obstructive sleep apnea (adult) (pediatric): Secondary | ICD-10-CM

## 2018-05-20 DIAGNOSIS — E662 Morbid (severe) obesity with alveolar hypoventilation: Secondary | ICD-10-CM

## 2018-05-20 MED ORDER — FUROSEMIDE 40 MG PO TABS: 40.0000 mg | ORAL_TABLET | Freq: Every day | ORAL | 0 refills | Status: DC

## 2018-05-20 NOTE — Assessment & Plan Note (Signed)
Referral to pulm given concern for combined OSA and OHS during hospital stay.

## 2018-05-20 NOTE — Progress Notes (Signed)
    Subjective:  Lauren Wood is a 58 y.o. female who presents to the [facility] today for hospital follow up. Patient was seen individually initially. Sister and Education officer, museum subsequently provided additional history while patient was in lab.  HPI:  Patient states the she has been feeling well since she got out of the hospital. She denies any shortness of breath. He also states the swelling in her legs is gone. She has no complaints today. She states that she does not know what medications she is on because her sister manages her medications.  Sister states that patient has been doing much better since returning from the hospital. She is frustrated that she did not know how sick her sister the patient was until she got all the diagnoses from the hospital stay. She would like the patient to no be seen without a family member present as the patient does not always know what is going on.  The pharmacy did not send her the lasix so sister has been giving the patient both her spironolactone and losartan. She has also been decreasing portion sizes to similar to what the patient was getting in the hospital. No sweets or fried food anymore. Low salt as well.     ROS: Per HPI  Social Hx: She reports that she has never smoked. She has never used smokeless tobacco. She reports that she does not drink alcohol or use drugs.  Objective:  Physical Exam: BP 130/78   Pulse 76   Temp 98.4 F (36.9 C) (Oral)   Ht 5\' 4"  (1.626 m)   Wt (!) 360 lb (163.3 kg)   LMP 05/08/2013   SpO2 93%   BMI 61.79 kg/m   Gen: NAD, resting comfortably CV: RRR with no murmurs appreciated. No JVD. Pulm: NWOB, CTAB with no crackles, wheezes, or rhonchi GI: Normal bowel sounds present. Soft, Nontender, Nondistended. MSK: no edema, cyanosis, or clubbing noted Skin: warm, dry with thick scaling hyperpigmented skin at ankles.  Assessment/Plan:  CHF (congestive heart failure) (HCC) Stable. Patient appears euvolemic on exam.  However has been continuing on medications that were supposed to be stopped on hospital discharge. Discussed staying on losartan, stopping spironolactone and starting lasix. Recheck BMP today. Patient has cardiology appt on 06/10/18.  Obesity hypoventilation syndrome ([place]) Referral to pulm given concern for combined OSA and OHS during hospital stay.   Bufford Lope, DO PGY-3, [place] [facility] 05/20/2018 3:20 PM

## 2018-05-20 NOTE — Patient Instructions (Addendum)
Glad to hear you are doing better after being in the hospital.   I will check bloodwork today. Stop the spironolactone and start lasix.  We will refer you to a pulmonologist (lung doctor) to talk about sleep apnea and obesity hypoventilation syndrome.       Obtain twice the volume of vegetables as either protein or starchy foods for both lunch and dinner.   You may also read about the DASH diet at: IdentityList.se    DASH Eating Plan DASH stands for "Dietary Approaches to Stop Hypertension." The DASH eating plan is a healthy eating plan that has been shown to reduce high blood pressure (hypertension). It may also reduce your risk for type 2 diabetes, heart disease, and stroke. The DASH eating plan may also help with weight loss. What are tips for following this plan?  General guidelines  Avoid eating more than 2,300 mg (milligrams) of salt (sodium) a day. If you have hypertension, you may need to reduce your sodium intake to 1,500 mg a day.  Limit alcohol intake to no more than 1 drink a day for nonpregnant women and 2 drinks a day for men. One drink equals 12 oz of beer, 5 oz of wine, or 1 oz of hard liquor.  Work with your health care provider to maintain a healthy body weight or to lose weight. Ask what an ideal weight is for you.  Get at least 30 minutes of exercise that causes your heart to beat faster (aerobic exercise) most days of the week. Activities may include walking, swimming, or biking.  Work with your health care provider or diet and nutrition specialist (dietitian) to adjust your eating plan to your individual calorie needs. Reading food labels   Check food labels for the amount of sodium per serving. Choose foods with less than 5 percent of the Daily Value of sodium. Generally, foods with less than 300 mg of sodium per serving fit into this eating plan.  To find whole grains, look for the word  "whole" as the first word in the ingredient list. Shopping  Buy products labeled as "low-sodium" or "no salt added."  Buy fresh foods. Avoid canned foods and premade or frozen meals. Cooking  Avoid adding salt when cooking. Use salt-free seasonings or herbs instead of table salt or sea salt. Check with your health care provider or pharmacist before using salt substitutes.  Do not fry foods. Cook foods using healthy methods such as baking, boiling, grilling, and broiling instead.  Cook with heart-healthy oils, such as olive, canola, soybean, or sunflower oil. Meal planning  Eat a balanced diet that includes: ? 5 or more servings of fruits and vegetables each day. At each meal, try to fill half of your plate with fruits and vegetables. ? Up to 6-8 servings of whole grains each day. ? Less than 6 oz of lean meat, poultry, or fish each day. A 3-oz serving of meat is about the same size as a deck of cards. One egg equals 1 oz. ? 2 servings of low-fat dairy each day. ? A serving of nuts, seeds, or beans 5 times each week. ? Heart-healthy fats. Healthy fats called Omega-3 fatty acids are found in foods such as flaxseeds and coldwater fish, like sardines, salmon, and mackerel.  Limit how much you eat of the following: ? Canned or prepackaged foods. ? Food that is high in trans fat, such as fried foods. ? Food that is high in saturated fat, such as fatty meat. ?  Sweets, desserts, sugary drinks, and other foods with added sugar. ? Full-fat dairy products.  Do not salt foods before eating.  Try to eat at least 2 vegetarian meals each week.  Eat more home-cooked food and less restaurant, buffet, and fast food.  When eating at a restaurant, ask that your food be prepared with less salt or no salt, if possible. What foods are recommended? The items listed may not be a complete list. Talk with your dietitian about what dietary choices are best for you. Grains Whole-grain or whole-wheat  bread. Whole-grain or whole-wheat pasta. Brown rice. [place]. Bulgur. Whole-grain and low-sodium cereals. Pita bread. Low-fat, low-sodium crackers. Whole-wheat flour tortillas. Vegetables Fresh or frozen vegetables (raw, steamed, roasted, or grilled). Low-sodium or reduced-sodium tomato and vegetable juice. Low-sodium or reduced-sodium tomato sauce and tomato paste. Low-sodium or reduced-sodium canned vegetables. Fruits All fresh, dried, or frozen fruit. Canned fruit in natural juice (without added sugar). Meat and other protein foods Skinless chicken or [place]. Ground chicken or [place]. Pork with fat trimmed off. Fish and seafood. Egg whites. Dried beans, peas, or lentils. Unsalted nuts, nut butters, and seeds. Unsalted canned beans. Lean cuts of beef with fat trimmed off. Low-sodium, lean deli meat. Dairy Low-fat (1%) or fat-free (skim) milk. Fat-free, low-fat, or reduced-fat cheeses. Nonfat, low-sodium ricotta or cottage cheese. Low-fat or nonfat yogurt. Low-fat, low-sodium cheese. Fats and oils Soft margarine without trans fats. Vegetable oil. Low-fat, reduced-fat, or light mayonnaise and salad dressings (reduced-sodium). Canola, safflower, olive, soybean, and sunflower oils. Avocado. Seasoning and other foods Herbs. Spices. Seasoning mixes without salt. Unsalted popcorn and pretzels. Fat-free sweets. What foods are not recommended? The items listed may not be a complete list. Talk with your dietitian about what dietary choices are best for you. Grains Baked goods made with fat, such as croissants, muffins, or some breads. Dry pasta or rice meal packs. Vegetables Creamed or fried vegetables. Vegetables in a cheese sauce. Regular canned vegetables (not low-sodium or reduced-sodium). Regular canned tomato sauce and paste (not low-sodium or reduced-sodium). Regular tomato and vegetable juice (not low-sodium or reduced-sodium). Angie Fava. Olives. Fruits Canned fruit in a light or heavy  syrup. Fried fruit. Fruit in cream or butter sauce. Meat and other protein foods Fatty cuts of meat. Ribs. Fried meat. Berniece Salines. Sausage. Bologna and other processed lunch meats. Salami. Fatback. Hotdogs. Bratwurst. Salted nuts and seeds. Canned beans with added salt. Canned or smoked fish. Whole eggs or egg yolks. Chicken or [place] with skin. Dairy Whole or 2% milk, cream, and half-and-half. Whole or full-fat cream cheese. Whole-fat or sweetened yogurt. Full-fat cheese. Nondairy creamers. Whipped toppings. Processed cheese and cheese spreads. Fats and oils Butter. Stick margarine. Lard. Shortening. Ghee. Bacon fat. Tropical oils, such as coconut, palm kernel, or palm oil. Seasoning and other foods Salted popcorn and pretzels. Onion salt, garlic salt, seasoned salt, table salt, and sea salt. Worcestershire sauce. Tartar sauce. Barbecue sauce. Teriyaki sauce. Soy sauce, including reduced-sodium. Steak sauce. Canned and packaged gravies. Fish sauce. Oyster sauce. Cocktail sauce. Horseradish that you find on the shelf. Ketchup. Mustard. Meat flavorings and tenderizers. Bouillon cubes. Hot sauce and Tabasco sauce. Premade or packaged marinades. Premade or packaged taco seasonings. Relishes. Regular salad dressings. Where to find more information:  National Heart, Lung, and [place]: https://wilson-eaton.com/  American Heart Association: www.heart.org Summary  The DASH eating plan is a healthy eating plan that has been shown to reduce high blood pressure (hypertension). It may also reduce your risk for type 2 diabetes,  heart disease, and stroke.  With the DASH eating plan, you should limit salt (sodium) intake to 2,300 mg a day. If you have hypertension, you may need to reduce your sodium intake to 1,500 mg a day.  When on the DASH eating plan, aim to eat more fresh fruits and vegetables, whole grains, lean proteins, low-fat dairy, and heart-healthy fats.  Work with your health care provider or diet and  nutrition specialist (dietitian) to adjust your eating plan to your individual calorie needs. This information is not intended to replace advice given to you by your health care provider. Make sure you discuss any questions you have with your health care provider. Document Released: 04/20/2011 Document Revised: 04/24/2016 Document Reviewed: 04/24/2016 Elsevier Interactive Patient Education  2019 [employer].

## 2018-05-20 NOTE — Assessment & Plan Note (Addendum)
Stable. Patient appears euvolemic on exam. However has been continuing on medications that were supposed to be stopped on hospital discharge. Discussed staying on losartan, stopping spironolactone and starting lasix. Recheck BMP today. Patient has cardiology appt on 06/10/18.

## 2018-05-20 NOTE — Telephone Encounter (Signed)
Verbal orders given  

## 2018-05-20 NOTE — Telephone Encounter (Signed)
Left message with verbal orders 

## 2018-05-20 NOTE — Telephone Encounter (Signed)
Lauren Wood, OT with [facility], calling for verbal orders:  [facility] OT 1x/week x 4 weeks  Call back is [phone] (confidential voicemail).  Danley Danker, RN [facility] RN)

## 2018-05-21 LAB — BASIC METABOLIC PANEL
BUN/Creatinine Ratio: 12 (ref 9–23)
BUN: 15 mg/dL (ref 6–24)
CO2: 24 mmol/L (ref 20–29)
Calcium: 9.5 mg/dL (ref 8.7–10.2)
Chloride: 101 mmol/L (ref 96–106)
Creatinine, Ser: 1.24 mg/dL — ABNORMAL HIGH (ref 0.57–1.00)
GFR calc Af Amer: 55 mL/min/{1.73_m2} — ABNORMAL LOW (ref >59)
GFR calc non Af Amer: 48 mL/min/{1.73_m2} — ABNORMAL LOW (ref >59)
Glucose: 76 mg/dL (ref 65–99)
Potassium: 4.7 mmol/L (ref 3.5–5.2)
Sodium: 143 mmol/L (ref 134–144)

## 2018-05-22 ENCOUNTER — Telehealth: Payer: Self-pay | Admitting: Family Medicine

## 2018-05-22 ENCOUNTER — Encounter: Payer: Self-pay | Admitting: Family Medicine

## 2018-05-22 NOTE — Telephone Encounter (Signed)
Left message for sister regarding lab results that Cr stable at patient eisitng CKD stage 3.

## 2018-05-28 ENCOUNTER — Telehealth: Payer: Self-pay

## 2018-05-28 NOTE — Telephone Encounter (Signed)
Bethany, PT with [facility], called to let you know patient being discharged from [facility] PT because she is no longer homebound.  No call back necessary.  Danley Danker, RN [facility] RN)

## 2018-05-29 NOTE — Telephone Encounter (Signed)
Acknowledged.

## 2018-06-03 ENCOUNTER — Other Ambulatory Visit: Payer: Self-pay | Admitting: Internal Medicine

## 2018-06-09 NOTE — Progress Notes (Deleted)
Cardiology Office Note   Date:  06/09/2018   ID:  Lauren Wood, DOB 1961/04/02, MRN [id number]  PCP:  Bufford Lope, DO  Cardiologist: Hochrein  No chief complaint on file.    History of Present Illness: Lauren Wood is a 58 y.o. female who presents for ongoing post hospitalization for acute on chronic diastolic CHF. HTN, with other history of mental retardation, CKD Stage III, OSA on CPAP, morbidly.    Past Medical History:  Diagnosis Date  . ALLERGIC RHINITIS 08/02/2009  . Arthritis   . Boil of vulva 11/07/2012  . Borderline personality disorder ([place]) 05/31/2011  . CKD (chronic kidney disease) stage 3, GFR 30-59 ml/min (HCC)    chronic  . Gout of foot 09/26/2013   L foot 09/2013. In the setting of HCTZ 25 mg and lasix 40 mg daily. Stopped HCTZ after decreasing to 12.5 mg. Treated with colchicine.    Marland Kitchen Heart failure (HCC)    chronic  . Heart murmur   . Hip pain 12/03/2012  . HTN (hypertension)   . 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 Obesity   . 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 PRESSURE ULCER OTHER SITE 10/01/2009   hx of  . Sleep apnea    uses cpap, setting of 2  . Unspecified psychosis 12/20/2009    Past Surgical History:  Procedure Laterality Date  . COLONOSCOPY N/A 06/24/2013   Procedure: COLONOSCOPY;  Surgeon: Gatha Mayer, MD;  Location: [facility];  Service: Endoscopy;  Laterality: N/A;  . COLONOSCOPY WITH PROPOFOL N/A 10/05/2015   Procedure: COLONOSCOPY WITH PROPOFOL;  Surgeon: Gatha Mayer, MD;  Location: [facility];  Service: Endoscopy;  Laterality: N/A;  . cyst removed  2011   forehead  . TYMPANOSTOMY TUBE PLACEMENT Bilateral yrs ago     Current Outpatient Medications  Medication Sig Dispense Refill  . acetaminophen (TYLENOL) 500 MG tablet Take 500-1,000 mg by mouth every 6 (six) hours as  needed (for headaches).    Marland Kitchen aspirin (QC LO-DOSE ASPIRIN) 81 MG EC tablet Take 1 tablet (81 mg total) by mouth daily. Swallow whole. 30 tablet 11  . benztropine (COGENTIN) 0.5 MG tablet Take 0.25 mg by mouth 2 (two) times daily.    Marland Kitchen buPROPion (WELLBUTRIN XL) 150 MG 24 hr tablet Take 150 mg by mouth every morning.    . carvedilol (COREG) 25 MG tablet Take 1 tablet (25 mg total) by mouth 2 (two) times daily with a meal. 180 tablet 3  . DOK 100 MG capsule TAKE ONE CAPSULE TWICE DAILY AS NEEDED FOR MILD OR MODERATE CONSTIPATION (Patient taking differently: Take 100 mg by mouth 2 (two) times daily as needed for mild constipation or moderate constipation. ) 60 capsule 0  . FLUoxetine (PROZAC) 40 MG capsule Take 40 mg by mouth every morning.    . furosemide (LASIX) 40 MG tablet Take 1 tablet (40 mg total) by mouth daily. 30 tablet 0  . meloxicam (MOBIC) 15 MG tablet Take 1 tablet (15 mg total) by mouth daily as needed for pain. Daily for 4-5 days (with food), then daily as needed for knee pain (Patient taking differently: Take 15 mg by mouth daily as needed (for knee pain- take with food). ) 10 tablet 0  . mirtazapine (REMERON) 30 MG tablet  Take 30 mg by mouth at bedtime.     Marland Kitchen omeprazole (PRILOSEC) 20 MG capsule TAKE ONE CAPSULE EACH DAY (Patient taking differently: Take 20 mg by mouth daily. ) 90 capsule 2  . prazosin (MINIPRESS) 1 MG capsule Take 1 mg by mouth daily.     . pregabalin (LYRICA) 50 MG capsule TAKE 1 CAPSULE 1 TO 3 HOURS BEFORE BEDTIME (Patient taking differently: Take 50 mg by mouth See admin instructions. Take 50 mg by mouth 1-3 hours before bedtime) 90 capsule 2  . risperiDONE (RISPERDAL) 3 MG tablet Take 3 mg by mouth 2 (two) times daily.     . traZODone (DESYREL) 100 MG tablet Take 100 mg by mouth at bedtime.      No current facility-administered medications for this visit.     Allergies:   Enalapril maleate; Hctz [hydrochlorothiazide]; and Lasix [furosemide]    Social History:   The patient  reports that she has never smoked. She has never used smokeless tobacco. She reports that she does not drink alcohol or use drugs.   Family History:  The patient's family history includes Colon cancer in her father; Diabetes in her mother.    ROS: All other systems are reviewed and negative. Unless otherwise mentioned in H&P    PHYSICAL EXAM: VS:  LMP 05/08/2013  , BMI There is no height or weight on file to calculate BMI. GEN: Well nourished, well developed, in no acute distress HEENT: normal Neck: no JVD, carotid bruits, or masses Cardiac: ***RRR; no murmurs, rubs, or gallops,no edema  Respiratory:  Clear to auscultation bilaterally, normal work of breathing GI: soft, nontender, nondistended, + BS MS: no deformity or atrophy Skin: warm and dry, no rash Neuro:  Strength and sensation are intact Psych: euthymic mood, full affect   EKG:  EKG {ACTION; IS/IS [id number]} ordered today. The ekg ordered today demonstrates ***   Recent Labs: 05/10/2018: B Natriuretic Peptide 358.6 05/11/2018: ALT 14; TSH 3.348 05/13/2018: Magnesium 1.7 05/15/2018: Hemoglobin 11.9; Platelets 239 05/20/2018: BUN 15; Creatinine, Ser 1.24; Potassium 4.7; Sodium 143    Lipid Panel    Component Value Date/Time   CHOL 117 05/11/2018 0310   CHOL 122 07/12/2017 0917   TRIG 37 05/11/2018 0310   HDL 37 (L) 05/11/2018 0310   HDL 44 07/12/2017 0917   CHOLHDL 3.2 05/11/2018 0310   VLDL 7 05/11/2018 0310   LDLCALC 73 05/11/2018 0310   LDLCALC 66 07/12/2017 0917      Wt Readings from Last 3 Encounters:  05/20/18 (!) 360 lb (163.3 kg)  05/15/18 (!) 350 lb 1.6 oz (158.8 kg)  09/03/17 (!) 365 lb (165.6 kg)      Other studies Reviewed: Echo 05/12/2018 Left ventricle: Difficult study even with use of Definity LVEF is   normal to mildly depressed with hypokinesis of the inferior and   septal walls Septal wall motion may reflect conduction delay.   OVerall these changes are different when  compared to echo report   from 2014.    ASSESSMENT AND PLAN:  1.  ***   Current medicines are reviewed at length with the patient today.    Labs/ tests ordered today include: *** Phill Myron. [place] Pugh, ANP, AACC   06/09/2018 12:49 PM    [place] [address] Office [phone] Fax [phone]

## 2018-06-10 ENCOUNTER — Ambulatory Visit: Payer: Medicare Other | Admitting: Adult Health

## 2018-06-29 NOTE — Progress Notes (Signed)
Cardiology Office Note   Date:  07/01/2018   ID:  Lauren Wood, DOB [birth year], MRN [id number]  PCP:  Bufford Lope, DO  Cardiologist:   Minus Breeding, MD   Chief Complaint  Patient presents with  . Shortness of Breath      History of Present Illness: Lauren Wood is a 58 y.o. female who presents for follow up of acute on chronic systolic HF.  She was in the hospital for this in Dec.  Echo at that time demonstrated a mildly reduced EF at low normal.  Since I last saw her she has been doing relatively well.  She lives with her sister.  They have been cutting down on her salt.  She has not been as physically active as I would like.  However, she has had no new shortness of breath, PND or orthopnea.  She has no palpitations, presyncope or syncope.  She denies any chest pressure, neck or arm discomfort.   Past Medical History:  Diagnosis Date  . ALLERGIC RHINITIS 08/02/2009  . Arthritis   . Boil of vulva 11/07/2012  . Borderline personality disorder ([place]) 05/31/2011  . CKD (chronic kidney disease) stage 3, GFR 30-59 ml/min (HCC)    chronic  . Gout of foot 09/26/2013   L foot 09/2013. In the setting of HCTZ 25 mg and lasix 40 mg daily. Stopped HCTZ after decreasing to 12.5 mg. Treated with colchicine.    Marland Kitchen Heart failure (HCC)    chronic  . Heart murmur   . Hip pain 12/03/2012  . HTN (hypertension)   . 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 Obesity   . 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 PRESSURE ULCER OTHER SITE 10/01/2009   hx of  . Sleep apnea    uses cpap, setting of 2  . Unspecified psychosis 12/20/2009    Past Surgical History:  Procedure Laterality Date  . COLONOSCOPY N/A 06/24/2013   Procedure: COLONOSCOPY;  Surgeon: Gatha Mayer, MD;  Location: [facility];  Service: Endoscopy;  Laterality: N/A;  .  COLONOSCOPY WITH PROPOFOL N/A 10/05/2015   Procedure: COLONOSCOPY WITH PROPOFOL;  Surgeon: Gatha Mayer, MD;  Location: [facility];  Service: Endoscopy;  Laterality: N/A;  . cyst removed  2011   forehead  . TYMPANOSTOMY TUBE PLACEMENT Bilateral yrs ago     Current Outpatient Medications  Medication Sig Dispense Refill  . acetaminophen (TYLENOL) 500 MG tablet Take 500-1,000 mg by mouth every 6 (six) hours as needed (for headaches).    Marland Kitchen aspirin (QC LO-DOSE ASPIRIN) 81 MG EC tablet Take 1 tablet (81 mg total) by mouth daily. Swallow whole. 30 tablet 11  . benztropine (COGENTIN) 0.5 MG tablet Take 0.25 mg by mouth 2 (two) times daily.    Marland Kitchen buPROPion (WELLBUTRIN XL) 150 MG 24 hr tablet Take 150 mg by mouth every morning.    . carvedilol (COREG) 25 MG tablet Take 1 tablet (25 mg total) by mouth 2 (two) times daily with a meal. 180 tablet 3  . DOK 100 MG capsule TAKE ONE CAPSULE TWICE DAILY AS NEEDED FOR MILD OR MODERATE CONSTIPATION (Patient taking differently: Take 100 mg by mouth 2 (two) times daily as needed for mild constipation or moderate constipation. ) 60 capsule 0  . FLUoxetine (PROZAC)  40 MG capsule Take 40 mg by mouth every morning.    . furosemide (LASIX) 40 MG tablet Take 1 tablet (40 mg total) by mouth daily. 30 tablet 0  . meloxicam (MOBIC) 15 MG tablet Take 1 tablet (15 mg total) by mouth daily as needed for pain. Daily for 4-5 days (with food), then daily as needed for knee pain (Patient taking differently: Take 15 mg by mouth daily as needed (for knee pain- take with food). ) 10 tablet 0  . mirtazapine (REMERON) 30 MG tablet Take 30 mg by mouth at bedtime.     Marland Kitchen omeprazole (PRILOSEC) 20 MG capsule TAKE ONE CAPSULE EACH DAY (Patient taking differently: Take 20 mg by mouth daily. ) 90 capsule 2  . prazosin (MINIPRESS) 1 MG capsule Take 1 mg by mouth daily.     . pregabalin (LYRICA) 50 MG capsule TAKE 1 CAPSULE 1 TO 3 HOURS BEFORE BEDTIME (Patient taking differently: Take 50 mg by  mouth See admin instructions. Take 50 mg by mouth 1-3 hours before bedtime) 90 capsule 2  . risperiDONE (RISPERDAL) 3 MG tablet Take 3 mg by mouth 2 (two) times daily.     . traZODone (DESYREL) 100 MG tablet Take 100 mg by mouth at bedtime.      No current facility-administered medications for this visit.     Allergies:   Enalapril maleate; Hctz [hydrochlorothiazide]; and Lasix [furosemide]    ROS:  Please see the history of present illness.   Otherwise, review of systems are positive for none.   All other systems are reviewed and negative.    PHYSICAL EXAM: VS:  BP (!) 157/97   Pulse 93   Ht 5\' 4"  (1.626 m)   Wt (!) 354 lb 3.2 oz (160.7 kg)   LMP 05/08/2013   BMI 60.80 kg/m  , BMI Body mass index is 60.8 kg/m. GENERAL:  Well appearing HEENT:  Pupils equal round and reactive, fundi not visualized, oral mucosa unremarkable NECK:  No jugular venous distention, waveform within normal limits, carotid upstroke brisk and symmetric, no bruits, no thyromegaly LYMPHATICS:  No cervical, inguinal adenopathy LUNGS:  Clear to auscultation bilaterally BACK:  No CVA tenderness CHEST:  Unremarkable HEART:  PMI not displaced or sustained,S1 and S2 within normal limits, no S3, no S4, no clicks, no rubs, no murmurs ABD:  Flat, positive bowel sounds normal in frequency in pitch, no bruits, no rebound, no guarding, no midline pulsatile mass, no hepatomegaly, no splenomegaly EXT:  2 plus pulses throughout, no edema, no cyanosis no clubbing SKIN:  No rashes no nodules NEURO:  Cranial nerves II through XII grossly intact, motor grossly intact throughout PSYCH:  Cognitively intact, oriented to person place and time    EKG:  EKG is not ordered today.    Recent Labs: 05/10/2018: B Natriuretic Peptide 358.6 05/11/2018: ALT 14; TSH 3.348 05/13/2018: Magnesium 1.7 05/15/2018: Hemoglobin 11.9; Platelets 239 05/20/2018: BUN 15; Creatinine, Ser 1.24; Potassium 4.7; Sodium 143    Lipid Panel      Component Value Date/Time   CHOL 117 05/11/2018 0310   CHOL 122 07/12/2017 0917   TRIG 37 05/11/2018 0310   HDL 37 (L) 05/11/2018 0310   HDL 44 07/12/2017 0917   CHOLHDL 3.2 05/11/2018 0310   VLDL 7 05/11/2018 0310   LDLCALC 73 05/11/2018 0310   LDLCALC 66 07/12/2017 0917      Wt Readings from Last 3 Encounters:  07/01/18 (!) 354 lb 3.2 oz (160.7 kg)  05/20/18 Marland Kitchen)  360 lb (163.3 kg)  05/15/18 (!) 350 lb 1.6 oz (158.8 kg)      Other studies Reviewed: Additional studies/ records that were reviewed today include: Labs. Review of the above records demonstrates:  Please see elsewhere in the note.     ASSESSMENT AND PLAN:  ACUTE ON CHRONIC DIASTOLIC HF:   The patient seems to be euvolemic.  We talked at length about salt and fluid restriction.  I will refer her to our health educator to continue this dialogue and talk about exercise.   We talked about PRN dosing of diuretic.  They are going to look into getting a scale.  CKD III: Her creatinine is well controlled as above.  No change in therapy.  HTN: The blood pressure is elevated today but this is unusual.  They are going to look into getting a large blood pressure cuff to try to get [place] some data points.  MORBID OBESITY: We are going to get a scale.  We talked about diet.  This will be discussed with the health educator.  She has very limited physical activity and she needs to increase this within the context of her living situation and her cognitive disabilities.    Current medicines are reviewed at length with the patient today.  The patient does not have concerns regarding medicines.  The following changes have been made:  no change  Labs/ tests ordered today include: None No orders of the defined types were placed in this encounter.    Disposition:   FU with APP in one year.     Signed, Minus Breeding, MD  07/01/2018 11:40 AM    [place]

## 2018-07-01 ENCOUNTER — Encounter: Payer: Self-pay | Admitting: Cardiology

## 2018-07-01 ENCOUNTER — Ambulatory Visit (INDEPENDENT_AMBULATORY_CARE_PROVIDER_SITE_OTHER): Payer: Medicare Other | Admitting: Cardiology

## 2018-07-01 VITALS — BP 157/97 | HR 93 | Ht 64.0 in | Wt 354.2 lb

## 2018-07-01 DIAGNOSIS — N183 Chronic kidney disease, stage 3 unspecified: Secondary | ICD-10-CM

## 2018-07-01 DIAGNOSIS — I10 Essential (primary) hypertension: Secondary | ICD-10-CM | POA: Diagnosis not present

## 2018-07-01 DIAGNOSIS — I5033 Acute on chronic diastolic (congestive) heart failure: Secondary | ICD-10-CM | POA: Diagnosis not present

## 2018-07-01 NOTE — Patient Instructions (Signed)
Medication Instructions:  Continue current medications  If you need a refill on your cardiac medications before your next appointment, please call your pharmacy.  Labwork: None Ordered   Testing/Procedures: None Ordered  Follow-Up: You will need a follow up appointment in 1 Year.  Please call our office 2 months in advance to schedule this appointment.  You may see one of the following Advanced Practice Providers on your designated Care Team:   Rosaria Ferries, PA-C . Jory Sims, DNP, ANP  At [facility], you and your health needs are our priority.  As part of our continuing mission to provide you with exceptional heart care, we have created designated Provider Care Teams.  These Care Teams include your primary Cardiologist (physician) and Advanced Practice Providers (APPs -  Physician Assistants and Nurse Practitioners) who all work together to provide you with the care you need, when you need it.  Thank you for choosing [facility] at [facility]!!

## 2018-07-08 ENCOUNTER — Telehealth: Payer: Self-pay | Admitting: Family Medicine

## 2018-07-08 DIAGNOSIS — G4733 Obstructive sleep apnea (adult) (pediatric): Secondary | ICD-10-CM

## 2018-07-08 NOTE — Telephone Encounter (Signed)
[name] is calling to see if the pt can have another prescription for a new cpap machine. She would like to know if this is something she can pick up at the front desk.   She would like someone to call her and let he know when it is ready to be picked up.

## 2018-07-08 NOTE — Telephone Encounter (Signed)
Order for CPAP placed. Will ask blue team to forward to [facility] since is not available at clinic.

## 2018-07-08 NOTE — Telephone Encounter (Signed)
Will forward to MD to place this order.  Geraldina Parrott,CMA  

## 2018-07-08 NOTE — Telephone Encounter (Signed)
Spoke with sister and let her know that a message was sent via Epic to [facility] letting them know a new order has been placed for cpap machine and they will process it this way.  She voiced understanding.  Jazmin Hartsell,CMA

## 2018-07-17 ENCOUNTER — Telehealth: Payer: Self-pay

## 2018-07-23 ENCOUNTER — Other Ambulatory Visit: Payer: Self-pay | Admitting: Family Medicine

## 2018-07-23 DIAGNOSIS — I5032 Chronic diastolic (congestive) heart failure: Secondary | ICD-10-CM

## 2018-08-22 ENCOUNTER — Other Ambulatory Visit: Payer: Self-pay | Admitting: Family Medicine

## 2018-08-22 DIAGNOSIS — I5032 Chronic diastolic (congestive) heart failure: Secondary | ICD-10-CM

## 2018-08-27 ENCOUNTER — Telehealth: Payer: Self-pay | Admitting: Family Medicine

## 2018-08-27 NOTE — Telephone Encounter (Signed)
Spoke with [employer] legal guardian.  She goes to an adult daycare and had may have had contact with 1 of the workers there who tested positive for COVID.  She really has been coughing but has not had fevers.  She does not appear to be having any shortness of breath or difficulty breathing.  Her legal guardian asked what to do for management.  We discussed self-monitoring at home for emergency warning signs and that if those should develop she should go the ER.  We discussed self-isolation at home strategies as well as good hygiene and cloth masks.  Given the [facility] website for resources and also discussed with criteria for when to discontinue self-isolation.  She voiced good understanding.

## 2018-10-29 ENCOUNTER — Other Ambulatory Visit: Payer: Self-pay | Admitting: Family Medicine

## 2018-10-29 NOTE — Telephone Encounter (Signed)
[place] pmp reviewed today. No red flags

## 2018-11-27 ENCOUNTER — Other Ambulatory Visit: Payer: Self-pay

## 2018-11-27 ENCOUNTER — Other Ambulatory Visit: Payer: Self-pay | Admitting: Family Medicine

## 2018-11-27 DIAGNOSIS — I5032 Chronic diastolic (congestive) heart failure: Secondary | ICD-10-CM

## 2018-11-27 NOTE — Telephone Encounter (Signed)
[name] MD I am approving this medication refill for this patient. I have not met her as of yet. I noticed she is on 23m once daily of furosemide. Her last labs in jan/feb showed hyperkalemia. Happy to prescribe this refill. Would like to see this patient in clinic for lab test and appointment.

## 2018-12-04 ENCOUNTER — Telehealth: Payer: Self-pay

## 2018-12-04 NOTE — Telephone Encounter (Signed)
Paper work in Museum/gallery conservator that needs to be signed and faxed. Physicians orders.

## 2018-12-04 NOTE — Telephone Encounter (Signed)
Formed completed and placed in to fax area.  Dorris Singh, MD  Family Medicine Teaching Service

## 2018-12-12 ENCOUNTER — Other Ambulatory Visit: Payer: Self-pay

## 2018-12-12 ENCOUNTER — Telehealth (INDEPENDENT_AMBULATORY_CARE_PROVIDER_SITE_OTHER): Payer: Medicare Other | Admitting: Family Medicine

## 2018-12-12 DIAGNOSIS — Z20822 Contact with and (suspected) exposure to covid-19: Secondary | ICD-10-CM

## 2018-12-12 DIAGNOSIS — Z20828 Contact with and (suspected) exposure to other viral communicable diseases: Secondary | ICD-10-CM

## 2018-12-12 NOTE — Progress Notes (Signed)
[place] Telemedicine Visit  Patient consented to have virtual visit. Method of visit: Telephone  Encounter participants: Patient: Lauren Wood - located at home Provider: Bonnita Hollow - located at office Others (if applicable): Sister, Arlene  Chief Complaint: Exposure to COVID19  HPI: Lauren Wood is a 58 y.o. female with past medical history of severe ID who presents with potential exposure to COVID-19.  Patient is nonverbal and the visit is conducted through her primary caregiver and sister [name].  Patient goes to an adult daycare center.  This week someone had tested positive at COVID-19 at the center.  Therefore in order to return to the center everyone who is a client or caregiver of a client has to be tested and confirm negative before returning.  Both Enid Derry and Sister [name] are asymptomatic and denies fevers, chills, cough, congestion.  ROS: per HPI  Pertinent PMHx: Severe ID  Exam:  Unable to assess as patient is nonverbal  Assessment/Plan: Potential exposure to COVID-19 Asymptomatic.  Needs testing to return to adult daycare.  Will order testing for patient and sister.  Time spent during visit with patient: 5 minutes

## 2018-12-13 ENCOUNTER — Other Ambulatory Visit: Payer: Self-pay

## 2018-12-13 DIAGNOSIS — Z20822 Contact with and (suspected) exposure to covid-19: Secondary | ICD-10-CM

## 2018-12-15 LAB — NOVEL CORONAVIRUS, NAA: SARS-CoV-2, NAA: NOT DETECTED

## 2018-12-25 ENCOUNTER — Telehealth: Payer: Self-pay | Admitting: *Deleted

## 2018-12-25 NOTE — Telephone Encounter (Signed)
Pts guardian Arlene calls because she was told that [facility] does not have the order for a cpap for her.  Message sent to Jolee Ewing and Darlina Guys to see if order written on 07/08/18 is still viable.  Christen Bame, CMA

## 2018-12-26 NOTE — Telephone Encounter (Signed)
Order received and will be processed. Christen Bame, CMA

## 2019-01-01 DIAGNOSIS — H6982 Other specified disorders of Eustachian tube, left ear: Secondary | ICD-10-CM | POA: Insufficient documentation

## 2019-01-01 DIAGNOSIS — H6992 Unspecified Eustachian tube disorder, left ear: Secondary | ICD-10-CM | POA: Insufficient documentation

## 2019-01-03 NOTE — Telephone Encounter (Signed)
Received note back from [facility], needs specific script AND recent Office visit in which we talk about machine use.  Appt amde with Dr. Vanessa Enoch Posey Pr[place] not available).  Form in Dr. Edwina Barth box.  Christen Bame, CMA

## 2019-01-16 ENCOUNTER — Encounter: Payer: Self-pay | Admitting: Family Medicine

## 2019-01-16 ENCOUNTER — Ambulatory Visit (INDEPENDENT_AMBULATORY_CARE_PROVIDER_SITE_OTHER): Payer: Medicare Other | Admitting: Family Medicine

## 2019-01-16 ENCOUNTER — Other Ambulatory Visit: Payer: Self-pay

## 2019-01-16 VITALS — BP 130/84 | HR 93 | Wt 361.0 lb

## 2019-01-16 DIAGNOSIS — Z23 Encounter for immunization: Secondary | ICD-10-CM

## 2019-01-16 DIAGNOSIS — Z0000 Encounter for general adult medical examination without abnormal findings: Secondary | ICD-10-CM

## 2019-01-16 DIAGNOSIS — G4733 Obstructive sleep apnea (adult) (pediatric): Secondary | ICD-10-CM

## 2019-01-16 MED ORDER — ZOSTER VAC RECOMB ADJUVANTED 50 MCG/0.5ML IM SUSR: 0.5000 mL | Freq: Once | INTRAMUSCULAR | 0 refills | Status: AC

## 2019-01-16 NOTE — Progress Notes (Addendum)
   Subjective:    Patient ID: Lauren Wood, female    DOB: March 27, 1961, 58 y.o.   MRN: [id number]   CC: Follow up regarding BiPAP machine  HPI:   OSA on Nightly BiPAP: Lauren Wood is a very pleasant 58 year old female that presents today along with her sister in order to get a replacement BiPAP machine.  Patient has been using BiPAP for many years and has noticed an improvement in her sleep quality as well as energy throughout the day during this time.  Over the past year or two patient endorses that her BiPAP machine has started having issues and recently it will randomly stop working.  Patient states they have contacted the manufacturer due to these issues and were prompted to ask about getting a replacement.  Patient currently uses the BiPAP every night.  Patient states that she does not know her current pressure settings on her BiPAP but does not believe this is changed since the last time one was ordered. Per chart review her pressure setting was 15/10 in 2014.  Health Maintenance: Patient also states she would like to receive the flu shot today as well as Shingrix.  Patient denies chest pain, shortness of breath, headache, abdominal pain at this time.  Has no complaints.  Smoking status reviewed   ROS: pertinent noted in the HPI   Past medical history, surgical, family, and social history reviewed and updated in the EMR as appropriate.  Objective:  BP 130/84   Pulse 93   Wt (!) 361 lb (163.7 kg)   LMP 05/08/2013   SpO2 95%   BMI 61.97 kg/m   Vitals and nursing note reviewed  General: NAD Cardiac: RRR, no murmurs. Respiratory: Breath sounds faint likely due to body habitus, otherwise clear with no wheezing, rhonchi, rales. Abdomen: Nontender, nondistended.  Bowel sounds present. Skin: warm and dry, no rashes noted Neuro: alert, no obvious focal deficits   Assessment & Plan:    OSA (obstructive sleep apnea) Assessment: 58 year old female with OSA on long-term BiPAP at  night who endorses better sleep and energy levels throughout the day while using BiPAP. Her current BiPap machine no longer works appropriately.  Plan: -Order placed for replacement BiPAP machine  Health care maintenance Plan: -Flu shot given today. - Shingrix given today. -Follow-up in approximately 2 months for blood work and office visit    Lurline Del, Industry PGY-1

## 2019-01-16 NOTE — Addendum Note (Signed)
Addended by: Valerie Roys on: 01/16/2019 12:11 PM   Modules accepted: Orders

## 2019-01-16 NOTE — Assessment & Plan Note (Addendum)
Plan: -Flu shot given today. - Shingrix given today. -Follow-up in approximately 2 months for blood work and office visit

## 2019-01-16 NOTE — Assessment & Plan Note (Addendum)
Assessment: 58 year old female with OSA on long-term BiPAP at night who endorses better sleep and energy levels throughout the day while using BiPAP. Her current BiPap machine no longer works appropriately.  Plan: -Order placed for replacement BiPAP machine

## 2019-01-16 NOTE — Patient Instructions (Addendum)
It was great to see you!  Our plans for today:  - I have placed an order for a BiPap machine - Please don't hesitate to return if I can help in any other way.  Take care and seek immediate care sooner if you develop any concerns.  Dr. Gentry [facility]

## 2019-01-22 ENCOUNTER — Ambulatory Visit (INDEPENDENT_AMBULATORY_CARE_PROVIDER_SITE_OTHER): Payer: Medicare Other | Admitting: Family Medicine

## 2019-01-22 ENCOUNTER — Other Ambulatory Visit: Payer: Self-pay

## 2019-01-22 ENCOUNTER — Ambulatory Visit (HOSPITAL_COMMUNITY)
Admission: RE | Admit: 2019-01-22 | Discharge: 2019-01-22 | Disposition: A | Discharge status: Home / Self Care | Payer: Medicare Other | Source: Ambulatory Visit | Attending: Family Medicine | Admitting: Family Medicine

## 2019-01-22 VITALS — BP 150/80 | HR 83 | Ht 64.0 in | Wt 367.2 lb

## 2019-01-22 DIAGNOSIS — G4733 Obstructive sleep apnea (adult) (pediatric): Secondary | ICD-10-CM

## 2019-01-22 DIAGNOSIS — I10 Essential (primary) hypertension: Secondary | ICD-10-CM | POA: Diagnosis not present

## 2019-01-22 DIAGNOSIS — I5033 Acute on chronic diastolic (congestive) heart failure: Secondary | ICD-10-CM | POA: Diagnosis present

## 2019-01-22 DIAGNOSIS — I447 Left bundle-branch block, unspecified: Secondary | ICD-10-CM | POA: Insufficient documentation

## 2019-01-22 MED ORDER — FUROSEMIDE 10 MG/ML IJ SOLN
40.0000 mg | Freq: Once | INTRAMUSCULAR | Status: AC
  Administered 2019-01-22: 40 mg via INTRAMUSCULAR

## 2019-01-22 NOTE — Assessment & Plan Note (Signed)
Continue BiPAP nightly. 

## 2019-01-22 NOTE — Progress Notes (Signed)
Subjective:    Patient ID: Lauren Wood, female    DOB: 1960/12/09, 58 y.o.   MRN: [id number]   CC: "rapid breathing"   HPI: Ms. Campen is a 58 year old female with a history of hypertension, HFpEF, OSA/OHS on nightly BiPAP, borderline personality disorder, and lifelong intellectual disability presenting to discuss the following:  Breathing concern: Started on Monday, noticed herself breathing heavier while walking.  This has persisted through today, denies any worsening symptoms since onset.  Additionally endorses increasing orthopnea and PND.  Patient does not feel that her LE edema has increased, however sister in the room states that her bilateral lower extremities are larger than usual.  Denies any chest pain, diaphoresis, coughing, fever, or lightheadedness/dizziness during this time.  No known exposures to COVID.  Lives with sister at home, goes to workshop during the day on her own.  Takes Lasix 40 mg daily with good diuresis and is compliant with this therapy.  Also compliant with BiPAP, however do note she was seen in the office on 9/3 due to her BiPAP not working sometimes. At night does not know her dry weight, however, weight 367 today, 361 on 9/3.  Patient states she had a similar feeling last year, and at being admitted onto our service for acute hypoxic respiratory failure thought to be secondary to acute decompensated heart failure, in the setting of URI.  Treated with IV diuresis at that time.  No previous MI, CVA.  Patient is not able to run through her general diet throughout the day and sister is not sure where she is during the day either.  States she will get a lot of "snacks " from other people at the workshop, eats what she wants to.  Denies drinking any sodas, however does enjoy sweet tea and water throughout the day.  Endorses around about 3 or more bottles of fluid daily.  Smoking status reviewed  Review of Systems Per HPI   Objective:  BP (!) 150/80   Pulse 83    Ht 5\' 4"  (1.626 m)   Wt (!) 367 lb 4 oz (166.6 kg)   LMP 05/08/2013   SpO2 94%   BMI 63.04 kg/m  Vitals and nursing note reviewed  General: NAD, pleasant, in mild distress after walking Cardiac: RRR, normal heart sounds Respiratory: Faint breath sounds due to body habitus, did not appreciate any crackles, wheezing. Unlabored breathing at rest, however tachypneic while walking  Abdomen: soft, nontender, nondistended Extremities: 1+ pitting edema to bilateral lower extremities to mid calf. Skin: warm and dry, no rashes noted Neuro: alert and oriented, no focal deficits Psych: normal affect  Walked with the patient with pulse ox on in the hallway, at beginning of walk was satting at 96% at rest.  Walked approximately 50 feet, desatted to mid 80s (85-89) and needed to rest due to dyspnea.  EKG: Sinus rhythm with occasional PVCs, left bundle branch block.  Similar to previous EKG in comparison, compared to 04/2018 EKG.  Assessment & Plan:   Acute on chronic diastolic HF (heart failure) (HCC) 3-day history of increasing dyspnea on exertion, orthopnea, and PND. Dyspneic and hypoxic to mid 80s with exertion, however unlabored at rest. Fluid status overall challenging due to body habitus. Up 6lbs since 9/3. Suspect clinical presentation and findings on exam are secondary to acute on chronic HFpEF, likely in the setting of dietary and fluid indiscretions. Could also consider progression of OHS with associated pulmonary hypertension, however reassured this was not  seen on recent echo in 04/2018.  Low concern for fluid overload secondary to renal disease although known CKD stage III, however will evaluate today (and assess for AKI in the setting of above).  Low concern for precipitating MI or PE given clinical history and no CP, reassured by EKG similar to previous today, however cannot necessarily rule this out. Wells score for PE 0.  No concern for COVID. - Discussed options with patient and sister  including outpatient vs further evaluation in the ED to rule out any precipitating illness etc/IV diuresis and limitations/risks to both, they ultimately decided on pursuing initial outpatient therapy  - Given IM Lasix 40 mg injection in the clinic today - Increase Lasix to 40 twice daily - Obtain CBC, CMP, BNP - Minimize salt intake, limit to 1.2L fluids daily - Strict return/ED precautions discussed including worsening shortness of breath, chest pain, pre-/syncope  - Follow-up scheduled with myself while patient still in the clinic for 9/11 at 3:10 PM - Precepted with Dr. Erin Hearing  Essential hypertension, benign Elevated today, 150/80.  Suspect in the setting of fluid overload as discussed above.  Will continue current regimen of carvedilol 25 mg twice daily and Lasix.   OSA (obstructive sleep apnea) Continue BiPAP nightly.   Follow-up on 9/11 at 3:10 PM.   [facility] Resident PGY-2

## 2019-01-22 NOTE — Assessment & Plan Note (Addendum)
3-day history of increasing dyspnea on exertion, orthopnea, and PND. Dyspneic and hypoxic to mid 80s with exertion, however unlabored at rest. Fluid status overall challenging due to body habitus. Up 6lbs since 9/3. Suspect clinical presentation and findings on exam are secondary to acute on chronic HFpEF, likely in the setting of dietary and fluid indiscretions. Could also consider progression of OHS with associated pulmonary hypertension, however reassured this was not seen on recent echo in 04/2018.  Low concern for fluid overload secondary to renal disease although known CKD stage III, however will evaluate today (and assess for AKI in the setting of above).  Low concern for precipitating MI or PE given clinical history and no CP, reassured by EKG similar to previous today, however cannot necessarily rule this out. Wells score for PE 0.  No concern for COVID. - Discussed options with patient and sister including outpatient vs further evaluation in the ED to rule out any precipitating illness etc/IV diuresis and limitations/risks to both, they ultimately decided on pursuing initial outpatient therapy  - Given IM Lasix 40 mg injection in the clinic today - Increase Lasix to 40 twice daily - Obtain CBC, CMP, BNP - Minimize salt intake, limit to 1.2L fluids daily - Strict return/ED precautions discussed including worsening shortness of breath, chest pain, pre-/syncope  - Follow-up scheduled with myself while patient still in the clinic for 9/11 at 3:10 PM - Precepted with Dr. Erin Hearing

## 2019-01-22 NOTE — Patient Instructions (Addendum)
It was wonderful to see you today.  I think that your rapid breathing is likely from an exacerbation of your heart failure.  We will try to treat this outpatient for now with increasing your Lasix up to 40 mg twice daily.  Please monitor your food intake, try to limit amount of salty foods including canned, frozen, and fast/fried foods.  Also restrict your fluid intake under 1.2 L a day, meaning less than 40 fluid ounces a day.   During this time, if you experience worsening shortness of breath, chest pain, increased sweating, lightheadedness/dizziness with feeling like you are going to pass out, or any additional concerning symptoms >>> then you need to present to the ED immediately.   I would like to see you back on Friday, 9/11, preferably in the morning to see how you are doing.

## 2019-01-22 NOTE — Assessment & Plan Note (Signed)
Elevated today, 150/80.  Suspect in the setting of fluid overload as discussed above.  Will continue current regimen of carvedilol 25 mg twice daily and Lasix.

## 2019-01-24 ENCOUNTER — Emergency Department (HOSPITAL_COMMUNITY): Payer: Medicare Other

## 2019-01-24 ENCOUNTER — Encounter (HOSPITAL_COMMUNITY): Payer: Self-pay

## 2019-01-24 ENCOUNTER — Other Ambulatory Visit: Payer: Self-pay

## 2019-01-24 ENCOUNTER — Observation Stay (HOSPITAL_COMMUNITY)
Admission: EM | Admit: 2019-01-24 | Discharge: 2019-01-26 | Disposition: A | Discharge status: Home / Self Care | Payer: Medicare Other | Attending: Family Medicine | Admitting: Family Medicine

## 2019-01-24 ENCOUNTER — Encounter: Payer: Self-pay | Admitting: Family Medicine

## 2019-01-24 ENCOUNTER — Ambulatory Visit (INDEPENDENT_AMBULATORY_CARE_PROVIDER_SITE_OTHER): Payer: Medicare Other | Admitting: Family Medicine

## 2019-01-24 ENCOUNTER — Telehealth: Payer: Self-pay

## 2019-01-24 DIAGNOSIS — Z20828 Contact with and (suspected) exposure to other viral communicable diseases: Secondary | ICD-10-CM | POA: Diagnosis not present

## 2019-01-24 DIAGNOSIS — J9601 Acute respiratory failure with hypoxia: Secondary | ICD-10-CM

## 2019-01-24 DIAGNOSIS — R918 Other nonspecific abnormal finding of lung field: Secondary | ICD-10-CM | POA: Diagnosis not present

## 2019-01-24 DIAGNOSIS — Z7982 Long term (current) use of aspirin: Secondary | ICD-10-CM | POA: Insufficient documentation

## 2019-01-24 DIAGNOSIS — Z6841 Body Mass Index (BMI) 40.0 and over, adult: Secondary | ICD-10-CM | POA: Diagnosis not present

## 2019-01-24 DIAGNOSIS — I509 Heart failure, unspecified: Secondary | ICD-10-CM

## 2019-01-24 DIAGNOSIS — I130 Hypertensive heart and chronic kidney disease with heart failure and stage 1 through stage 4 chronic kidney disease, or unspecified chronic kidney disease: Principal | ICD-10-CM | POA: Insufficient documentation

## 2019-01-24 DIAGNOSIS — Z79899 Other long term (current) drug therapy: Secondary | ICD-10-CM | POA: Diagnosis not present

## 2019-01-24 DIAGNOSIS — G4733 Obstructive sleep apnea (adult) (pediatric): Secondary | ICD-10-CM | POA: Diagnosis not present

## 2019-01-24 DIAGNOSIS — R2689 Other abnormalities of gait and mobility: Secondary | ICD-10-CM | POA: Insufficient documentation

## 2019-01-24 DIAGNOSIS — M109 Gout, unspecified: Secondary | ICD-10-CM | POA: Insufficient documentation

## 2019-01-24 DIAGNOSIS — F603 Borderline personality disorder: Secondary | ICD-10-CM | POA: Diagnosis not present

## 2019-01-24 DIAGNOSIS — Z791 Long term (current) use of non-steroidal anti-inflammatories (NSAID): Secondary | ICD-10-CM | POA: Diagnosis not present

## 2019-01-24 DIAGNOSIS — I5033 Acute on chronic diastolic (congestive) heart failure: Secondary | ICD-10-CM | POA: Diagnosis present

## 2019-01-24 DIAGNOSIS — Z888 Allergy status to other drugs, medicaments and biological substances status: Secondary | ICD-10-CM | POA: Diagnosis not present

## 2019-01-24 DIAGNOSIS — K219 Gastro-esophageal reflux disease without esophagitis: Secondary | ICD-10-CM | POA: Diagnosis not present

## 2019-01-24 DIAGNOSIS — F79 Unspecified intellectual disabilities: Secondary | ICD-10-CM | POA: Diagnosis not present

## 2019-01-24 LAB — CBC WITH DIFFERENTIAL/PLATELET
Basophils Absolute: 0 10*3/uL (ref 0.0–0.2)
Basos: 1 %
EOS (ABSOLUTE): 0.4 10*3/uL (ref 0.0–0.4)
Eos: 4 %
Hematocrit: 40.3 % (ref 34.0–46.6)
Hemoglobin: 13 g/dL (ref 11.1–15.9)
Immature Grans (Abs): 0 10*3/uL (ref 0.0–0.1)
Immature Granulocytes: 0 %
Lymphocytes Absolute: 3.3 10*3/uL — ABNORMAL HIGH (ref 0.7–3.1)
Lymphs: 38 %
MCH: 26.9 pg (ref 26.6–33.0)
MCHC: 32.3 g/dL (ref 31.5–35.7)
MCV: 83 fL (ref 79–97)
Monocytes Absolute: 0.7 10*3/uL (ref 0.1–0.9)
Monocytes: 8 %
Neutrophils Absolute: 4.2 10*3/uL (ref 1.4–7.0)
Neutrophils: 49 %
Platelets: 231 10*3/uL (ref 150–450)
RBC: 4.83 x10E6/uL (ref 3.77–5.28)
RDW: 14.8 % (ref 11.7–15.4)
WBC: 8.6 10*3/uL (ref 3.4–10.8)

## 2019-01-24 LAB — BRAIN NATRIURETIC PEPTIDE: BNP: 104.6 pg/mL — ABNORMAL HIGH (ref 0.0–100.0)

## 2019-01-24 LAB — BASIC METABOLIC PANEL
Anion gap: 14 (ref 5–15)
BUN: 16 mg/dL (ref 6–20)
CO2: 26 mmol/L (ref 22–32)
Calcium: 9.4 mg/dL (ref 8.9–10.3)
Chloride: 103 mmol/L (ref 98–111)
Creatinine, Ser: 1.24 mg/dL — ABNORMAL HIGH (ref 0.44–1.00)
GFR calc Af Amer: 55 mL/min — ABNORMAL LOW (ref >60)
GFR calc non Af Amer: 48 mL/min — ABNORMAL LOW (ref >60)
Glucose, Bld: 91 mg/dL (ref 70–99)
Potassium: 4 mmol/L (ref 3.5–5.1)
Sodium: 143 mmol/L (ref 135–145)

## 2019-01-24 LAB — TROPONIN I (HIGH SENSITIVITY): Troponin I (High Sensitivity): 16 ng/L (ref <18)

## 2019-01-24 LAB — COMPREHENSIVE METABOLIC PANEL
ALT: 8 IU/L (ref 0–32)
AST: 17 IU/L (ref 0–40)
Albumin/Globulin Ratio: 1.2 (ref 1.2–2.2)
Albumin: 4 g/dL (ref 3.8–4.9)
Alkaline Phosphatase: 158 IU/L — ABNORMAL HIGH (ref 39–117)
BUN/Creatinine Ratio: 15 (ref 9–23)
BUN: 19 mg/dL (ref 6–24)
Bilirubin Total: 0.2 mg/dL (ref 0.0–1.2)
CO2: 28 mmol/L (ref 20–29)
Calcium: 9.5 mg/dL (ref 8.7–10.2)
Chloride: 103 mmol/L (ref 96–106)
Creatinine, Ser: 1.23 mg/dL — ABNORMAL HIGH (ref 0.57–1.00)
GFR calc Af Amer: 56 mL/min/{1.73_m2} — ABNORMAL LOW (ref >59)
GFR calc non Af Amer: 48 mL/min/{1.73_m2} — ABNORMAL LOW (ref >59)
Globulin, Total: 3.3 g/dL (ref 1.5–4.5)
Glucose: 83 mg/dL (ref 65–99)
Potassium: 4.1 mmol/L (ref 3.5–5.2)
Sodium: 147 mmol/L — ABNORMAL HIGH (ref 134–144)
Total Protein: 7.3 g/dL (ref 6.0–8.5)

## 2019-01-24 LAB — SARS CORONAVIRUS 2 BY RT PCR (HOSPITAL ORDER, PERFORMED IN ~~LOC~~ HOSPITAL LAB): SARS Coronavirus 2: NEGATIVE

## 2019-01-24 LAB — D-DIMER, QUANTITATIVE: D-Dimer, Quant: 0.61 ug/mL-FEU — ABNORMAL HIGH (ref 0.00–0.50)

## 2019-01-24 MED ORDER — IOHEXOL 350 MG/ML SOLN
100.0000 mL | Freq: Once | INTRAVENOUS | Status: AC | PRN
  Administered 2019-01-24: 100 mL via INTRAVENOUS

## 2019-01-24 MED ORDER — FUROSEMIDE 10 MG/ML IJ SOLN
40.0000 mg | Freq: Once | INTRAMUSCULAR | Status: AC
  Administered 2019-01-25: 40 mg via INTRAVENOUS
  Filled 2019-01-24: qty 4

## 2019-01-24 NOTE — ED Provider Notes (Signed)
[facility] EMERGENCY DEPARTMENT Provider Note   CSN: [id number] Arrival date & time: 01/24/19  1655     History   Chief Complaint Chief Complaint  Patient presents with  . Shortness of Breath    HPI Lauren Wood is a 58 y.o. female with history of intellectual disability, CKD, CHF, heart murmur, hypertension presents for evaluation of acute onset, persistent shortness of breath for 5 days.  She notes dyspnea on exertion, improves with rest.  She was seen and evaluated by her PCP 2 days ago who obtained blood work noted her BNP was mildly elevated, hypoxic to mid 80s with exertion.  She was noted to be up 6 pounds at that visit compared to her last office visit 6 days prior.  She was given a dose of IM Lasix in the office with concern for CHF exacerbation.  Shared decision-making conversation with the patient and her sister was obtained and they decided to pursue initial outpatient therapy.  They recommended increasing her Lasix to 40 mg twice daily but the patient did not do this.  She returned for reevaluation today with persistent symptoms though was down 6 pounds from 2 days ago.  With persistent symptoms she was sent to the ED for further evaluation.  The patient denies any fevers, cough, chest pains, abdominal pain, nausea, or vomiting.     The history is provided by the patient and medical records.    Past Medical History:  Diagnosis Date  . ALLERGIC RHINITIS 08/02/2009  . Arthritis   . Boil of vulva 11/07/2012  . Borderline personality disorder ([place]) 05/31/2011  . CKD (chronic kidney disease) stage 3, GFR 30-59 ml/min (HCC)    chronic  . Gout of foot 09/26/2013   L foot 09/2013. In the setting of HCTZ 25 mg and lasix 40 mg daily. Stopped HCTZ after decreasing to 12.5 mg. Treated with colchicine.    [facility] Kitchen Heart failure (HCC)    chronic  . Heart murmur   . Hip pain 12/03/2012  . HTN (hypertension)   . 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 Obesity   .  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 PRESSURE ULCER OTHER SITE 10/01/2009   hx of  . Sleep apnea    uses cpap, setting of 2  . Unspecified psychosis 12/20/2009    Patient Active Problem List   Diagnosis Date Noted  . Acute on chronic diastolic HF (heart failure) ([place]) 07/01/2018  . Obesity hypoventilation syndrome ([place])   . Intellectual disability   . CHF (congestive heart failure) ([place]) 05/10/2018  . Diverticulosis of colon 11/22/2016  . Restless leg syndrome 09/05/2016  . Urinary incontinence 12/24/2014  . Gout of foot 09/26/2013  . Onychomycosis 09/25/2013  . Personal history of colonic polyps 06/24/2013  . Health care maintenance 02/11/2013  . Hearing loss in left ear 02/11/2013  . OSA (obstructive sleep apnea) 12/11/2012  . Borderline personality disorder ([place]) 05/31/2011  . CKD (chronic kidney disease) stage 3, GFR 30-59 ml/min (HCC) 02/06/2011  . Psychosis ([place]) 12/20/2009  . Morbid obesity ([place]) 08/02/2009  . MENTAL RETARDATION 08/02/2009  . Essential hypertension, benign 08/02/2009  . GERD 08/02/2009  . OSTEOARTHRITIS, MULTIPLE JOINTS 08/02/2009  . SLEEP DISORDER 08/02/2009    Past Surgical History:  Procedure Laterality Date  . COLONOSCOPY  N/A 06/24/2013   Procedure: COLONOSCOPY;  Surgeon: Gatha Mayer, MD;  Location: [facility] ENDOSCOPY;  Service: Endoscopy;  Laterality: N/A;  . COLONOSCOPY WITH PROPOFOL N/A 10/05/2015   Procedure: COLONOSCOPY WITH PROPOFOL;  Surgeon: Gatha Mayer, MD;  Location: [facility] ENDOSCOPY;  Service: Endoscopy;  Laterality: N/A;  . cyst removed  2011   forehead  . TYMPANOSTOMY TUBE PLACEMENT Bilateral yrs ago     OB History   No obstetric history on file.      Home Medications    Prior to Admission medications   Medication Sig Start Date End Date Taking? Authorizing Provider   acetaminophen (TYLENOL) 500 MG tablet Take 500-1,000 mg by mouth every 6 (six) hours as needed (for headaches).    [provider]  aspirin (QC LO-DOSE ASPIRIN) 81 MG EC tablet Take 1 tablet (81 mg total) by mouth daily. Swallow whole. 11/23/16   Smiley Houseman, MD  benztropine (COGENTIN) 0.5 MG tablet Take 0.25 mg by mouth 2 (two) times daily.    [provider]  buPROPion (WELLBUTRIN XL) 150 MG 24 hr tablet Take 150 mg by mouth every morning.    [provider]  carvedilol (COREG) 25 MG tablet Take 1 tablet (25 mg total) by mouth 2 (two) times daily with a meal. 06/03/18   Yoo, Elsia J, DO  DOK 100 MG capsule TAKE ONE CAPSULE TWICE DAILY AS NEEDED FOR MILD OR MODERATE CONSTIPATION Patient taking differently: Take 100 mg by mouth 2 (two) times daily as needed for mild constipation or moderate constipation.  02/26/17   Smiley Houseman, MD  FLUoxetine (PROZAC) 40 MG capsule Take 40 mg by mouth every morning.    [provider]  furosemide (LASIX) 40 MG tablet Take 1 tablet (40 mg total) by mouth daily. 11/27/18   [name], MD  meloxicam (MOBIC) 15 MG tablet Take 1 tablet (15 mg total) by mouth daily as needed for pain. Daily for 4-5 days (with food), then daily as needed for knee pain Patient taking differently: Take 15 mg by mouth daily as needed (for knee pain- take with food).  09/03/17   Smiley Houseman, MD  mirtazapine (REMERON) 30 MG tablet Take 30 mg by mouth at bedtime.  04/11/13   [provider]  omeprazole (PRILOSEC) 20 MG capsule TAKE ONE CAPSULE EACH DAY Patient taking differently: Take 20 mg by mouth daily.  04/10/18   Bufford Lope, DO  prazosin (MINIPRESS) 1 MG capsule Take 1 mg by mouth daily.  11/03/14   [provider]  pregabalin (LYRICA) 50 MG capsule Take 1 capsule (50 mg total) by mouth See admin instructions. Take 50 mg by mouth 1-3 hours before bedtime 10/29/18   Bufford Lope, DO  risperiDONE (RISPERDAL) 3 MG  tablet Take 3 mg by mouth 2 (two) times daily.  04/26/13   [provider]  traZODone (DESYREL) 100 MG tablet Take 100 mg by mouth at bedtime.  07/14/12   [provider]    Family History Family History  Problem Relation Age of Onset  . Colon cancer Father        around age 44  . Diabetes Mother   . Esophageal cancer Neg Hx   . Pancreatic cancer Neg Hx   . Stomach cancer Neg Hx   . Liver disease Neg Hx     Social History Social History   Tobacco Use  . Smoking status: Never Smoker  . Smokeless tobacco: Never Used  Substance Use Topics  . Alcohol use: No    Alcohol/week: 0.0 standard drinks  . Drug use: No     Allergies   Enalapril maleate, Hctz [hydrochlorothiazide], and Lasix [furosemide]   Review of Systems Review of Systems  Constitutional: Negative for chills and fever.  Respiratory: Positive for shortness of breath. Negative for cough.   Cardiovascular: Negative for chest pain.  Gastrointestinal: Negative for abdominal pain, nausea and vomiting.  All other systems reviewed and are negative.    Physical Exam Updated Vital Signs BP 135/85   Pulse 76   Temp 98.4 F (36.9 C) (Oral)   Resp 14   Ht 5\' 4"  (1.626 m)   Wt (!) 181.4 kg   LMP 05/08/2013   SpO2 98%   BMI 68.66 kg/m   Physical Exam Vitals signs and nursing note reviewed.  Constitutional:      General: She is not in acute distress.    Appearance: She is well-developed. She is obese.  HENT:     Head: Normocephalic and atraumatic.  Eyes:     General:        Right eye: No discharge.        Left eye: No discharge.     Conjunctiva/sclera: Conjunctivae normal.  Neck:     Vascular: No JVD.     Trachea: No tracheal deviation.  Cardiovascular:     Rate and Rhythm: Normal rate and regular rhythm.     Pulses: Normal pulses.  Pulmonary:     Effort: Pulmonary effort is normal.     Comments: Examination somewhat limited due to body habitus.  Mildly decreased breath sounds in the  posterior lung fields.  SPO2 saturations 89 to 91% on room air, placed on 2 L supplemental oxygen via nasal cannula with improvement Abdominal:     General: There is no distension.     Palpations: Abdomen is soft.     Tenderness: There is no abdominal tenderness. There is no guarding.  Musculoskeletal:     Right lower leg: She exhibits no tenderness.     Left lower leg: She exhibits no tenderness.     Comments: Trace pitting edema of the bilateral lower extremities  Skin:    General: Skin is warm and dry.     Findings: No erythema.  Neurological:     Mental Status: She is alert.  Psychiatric:        Behavior: Behavior normal.      ED Treatments / Results  Labs (all labs ordered are listed, but only abnormal results are displayed) Labs Reviewed  BASIC METABOLIC PANEL - Abnormal; Notable for the following components:      Result Value   Creatinine, Ser 1.24 (*)    GFR calc non Af Amer 48 (*)    GFR calc Af Amer 55 (*)    All other components within normal limits  D-DIMER, QUANTITATIVE (NOT AT [facility]) - Abnormal; Notable for the following components:   D-Dimer, Quant 0.61 (*)    All other components within normal limits  SARS CORONAVIRUS 2 (HOSPITAL ORDER, [facility])  CBC WITH DIFFERENTIAL/PLATELET  CBC WITH DIFFERENTIAL/PLATELET  BRAIN NATRIURETIC PEPTIDE  TROPONIN I (HIGH SENSITIVITY)  TROPONIN I (HIGH SENSITIVITY)    EKG None  Radiology Dg Chest 2 View  Result Date: 01/24/2019 CLINICAL DATA:  Patient with shortness of breath. EXAM: CHEST - 2 VIEW COMPARISON:  Chest radiograph 05/10/2018 FINDINGS: Monitoring leads overlie the patient. Stable cardiomegaly. Low lung volumes. Bibasilar  heterogeneous pulmonary opacities. Thoracic spine degenerative changes. IMPRESSION: Cardiomegaly.  Low lung volumes with bibasilar atelectasis. Electronically Signed   By: Lovey Newcomer M.D.   On: 01/24/2019 19:01    Procedures Procedures (including critical care  time)  Medications Ordered in ED Medications  furosemide (LASIX) injection 40 mg (has no administration in time range)  iohexol (OMNIPAQUE) 350 MG/ML injection 100 mL (100 mLs Intravenous Contrast Given 01/24/19 2317)     Initial Impression / Assessment and Plan / ED Course  I have reviewed the triage vital signs and the nursing notes.  Pertinent labs & imaging results that were available during my care of the patient were reviewed by me and considered in my medical decision making (see chart for details).        Patient presenting sent from family medicine teaching service outpatient clinic for further evaluation of ongoing shortness of breath.  She was seen in their office 2 days ago and was given a dose of IV Lasix with some improvement in her weight but no improvement in her symptoms.  She is afebrile in the ED, tachypneic and hypoxic to 89-91% on room air with improvement on supplemental oxygen.  Chest x-ray shows cardiomegaly but no effusions or consolidations.  We had difficulty obtaining blood work but so far she does have a mildly elevated d-dimer, mildly elevated creatinine, no metabolic derangements.  Troponin is negative.  COVID test is negative.  Currently CBC and  BNP are pending. EKG today unchanged compared from 2 days ago. Low suspicion of ACS/MI.   10:30 PM Spoke with family medicine teaching service resident.  She recommends waiting on the results of the CTA and giving a dose of IV Lasix in the ED and ambulating.  If her CT scan is abnormal or if she has hypoxia with ambulation then she will require admission to the hospital but if CTA is negative for any acute concerning abnormalities and she is ambulatory with stable SPO2 saturations without supplemental oxygen via nasal cannula then she could be reasonable for outpatient management as she does follow closely with their clinic.  12:00AM Signed out care to oncoming provider PA Petrucelli.  Patient pending CTA and dose of IV  Lasix in the ED.  Plan as detailed above per conversation with family medicine teaching service.  Final Clinical Impressions(s) / ED Diagnoses   Final diagnoses:  Acute respiratory failure with hypoxia [facility])    ED Discharge Orders    None       Debroah Baller 01/25/19 0032    Virgel Manifold, MD 01/26/19 1418

## 2019-01-24 NOTE — ED Notes (Signed)
Lab called stating the blood was hemolyzed; IV Team consult was placed for IV.

## 2019-01-24 NOTE — Assessment & Plan Note (Addendum)
Unchanged. Unfortunately, did not increase her Lasix yesterday or today, however received IM Lasix and additional dose on Wednesday with significant diuresis. Down 8 lbs on same scale as on 9/9.  Fluid status overall challenging given body habitus. Reassured Cr is at baseline, making renal insufficiency less likely cause.  Again, will continue to consider precipitating MI, however reassured that EKG was unchanged from previous on 9/9 and no associated chest pains.  While it is still possible that she is likely having an acute on chronic heart failure exacerbation in the setting of dietary indiscretions, concern for more ominous underlying etiology given unchanged dyspnea on exertion despite weight decrease/fluid loss and worsening oxygen saturations at rest in comparison to baseline.  Discussed proceeding with further evaluation in the ED, patient and legal guardian (sister) agree to move forward with this today. -- Discussed case with Dr. Gwendlyn Deutscher  - [facility] provided transport of patient to ED, signout provided to charge nurse, Vicente Males - Consider obtaining CTA and repeat BMP for mild hypernatremia noted on 9/9 with additional work-up as appropriate - Will follow with patient's ED course and will follow-up pending disposition

## 2019-01-24 NOTE — Progress Notes (Addendum)
   Subjective:    Patient ID: Lauren Wood, female    DOB: 1960-07-10, 58 y.o.   MRN: [id number]  CC: F/u Acute on chronic HF   HPI: Lauren Wood is a 58 year old female with a history of hypertension, HFpEF (mildly reduced EF on echo in 04/2018, however limited study), OSA/OHS on nightly BiPAP, borderline personality disorder, and lifelong intellectual disability presenting discuss the following:  Follow-up acute on chronic HFpEF: Seen on Wednesday, did not want to pursue further evaluation in the ED at that time, given IM Lasix x1 in the clinic and recommended to increase Lasix to 40 twice daily.  Had really good diuresis on Wednesday evening, took additional 40 mg that night as well.  However, yesterday and today has only taken 40 mg daily, did not double due to not having enough medication.  She was weighed on the same scale with in our office today, down 8 pounds. However, today she notes no improvement in her dyspnea on exertion.  Still having increased orthopnea, PND, and lower leg swelling.  Continues to deny any chest pains, fever, coughing, exposures to COVID.   Please see office note on 9/9 for further details as well.  Review of Systems Per HPI   Objective:  BP 140/80   Pulse 88   Wt (!) 359 lb 3.2 oz (162.9 kg)   LMP 05/08/2013   SpO2 91%   BMI 61.66 kg/m  Vitals and nursing note reviewed  Additionally remeasured pulse ox in the room while at rest after sitting for > 20 min.  At rest, intermittently measuring between 89-91%. did not go over 92.  Did not re-walk her today, as previously desaturated to mid 16s on Wednesday.   General: NAD, pleasant Cardiac: RRR, normal heart sounds Respiratory: Faint breath sounds due to body habitus, overall clear without any noted crackles or wheezing.  Mild tachypnea at rest, but overall comfortable.  Becomes quite dyspneic with walking. Abdomen: soft, nontender Extremities: Lower extremity edema present bilaterally  Neuro: alert and  oriented, no focal deficits Psych: normal affect  Assessment & Plan:   Acute on chronic diastolic HF (heart failure) (HCC) Unchanged. Unfortunately, did not increase her Lasix yesterday or today, however received IM Lasix and additional dose on Wednesday with significant diuresis. Down 8 lbs on same scale as on 9/9.  Fluid status overall challenging given body habitus. Reassured Cr is at baseline, making renal insufficiency less likely cause.  Again, will continue to consider precipitating MI, however reassured that EKG was unchanged from previous on 9/9 and no associated chest pains.  While it is still possible that she is likely having an acute on chronic heart failure exacerbation in the setting of dietary indiscretions, concern for more ominous underlying etiology given unchanged dyspnea on exertion despite weight decrease/fluid loss and worsening oxygen saturations at rest in comparison to baseline.  Discussed proceeding with further evaluation in the ED, patient and legal guardian (sister) agree to move forward with this today. -- Discussed case with Dr. Gwendlyn Deutscher  - [facility] provided transport of patient to ED, signout provided to charge nurse, Vicente Males - Consider obtaining CTA and repeat BMP for mild hypernatremia noted on 9/9 with additional work-up as appropriate - Will follow with patient's ED course and will follow-up pending disposition   Presented to ED, will follow course.  [facility] Resident PGY-2

## 2019-01-24 NOTE — Progress Notes (Signed)
Patient had 80 ml omni 350 extravasation left ac dr sanford assessed patient iv was removed,arm was elevated ice placed at site,verbal instructions were given to the patient,a verbal handoff was given to kaitlynn p rn .extravasation discharge orders were placed.

## 2019-01-24 NOTE — ED Notes (Signed)
Patient connected to the bedside monitor

## 2019-01-24 NOTE — Patient Instructions (Signed)
It was wonderful to see you again today Lauren Wood.  I am sorry that your breathing has not gotten any better today.  We are to have you go to the emergency room for further evaluation, likely to include CTA and repeat labs to recheck your sodium, especially as your breathing has not gotten better and your oxygen saturations are decreasing despite weight/fluid loss.   I will follow-up with the results of your ED visit and have you come in as appropriate.

## 2019-01-24 NOTE — ED Triage Notes (Signed)
Pt BIB [facility] from home via family practice PCP across st. Pt went to PCP this AM for eval of worsening DOE since Monday. Pt also reports worsening blt LE edema. Pt states worse while laying down. Apparently PCP had adjust dose of lasix, but it is not improving pt's status. Denies CP, N/V, diaphoresis. Endorses hx of CHF

## 2019-01-24 NOTE — ED Triage Notes (Signed)
Patient states she is having shortness of breath of new onset since this past Monday that is not associated with chest pain. The patient states her PCP recommends doing an "X-Ray of her heart" and that is why she came to the ER today. The patient complains of no pain at this time.

## 2019-01-24 NOTE — ED Notes (Signed)
Attempted to draw blood for testing twice and once more through the IV. EMT will try again.

## 2019-01-25 ENCOUNTER — Emergency Department (HOSPITAL_COMMUNITY): Payer: Medicare Other

## 2019-01-25 ENCOUNTER — Encounter (HOSPITAL_COMMUNITY): Payer: Self-pay | Admitting: Family Medicine

## 2019-01-25 ENCOUNTER — Other Ambulatory Visit: Payer: Self-pay

## 2019-01-25 ENCOUNTER — Observation Stay (HOSPITAL_BASED_OUTPATIENT_CLINIC_OR_DEPARTMENT_OTHER): Payer: Medicare Other

## 2019-01-25 DIAGNOSIS — F79 Unspecified intellectual disabilities: Secondary | ICD-10-CM | POA: Diagnosis not present

## 2019-01-25 DIAGNOSIS — I5033 Acute on chronic diastolic (congestive) heart failure: Secondary | ICD-10-CM | POA: Diagnosis not present

## 2019-01-25 DIAGNOSIS — J9601 Acute respiratory failure with hypoxia: Secondary | ICD-10-CM

## 2019-01-25 DIAGNOSIS — N289 Disorder of kidney and ureter, unspecified: Secondary | ICD-10-CM

## 2019-01-25 DIAGNOSIS — I509 Heart failure, unspecified: Secondary | ICD-10-CM

## 2019-01-25 DIAGNOSIS — Z20828 Contact with and (suspected) exposure to other viral communicable diseases: Secondary | ICD-10-CM | POA: Diagnosis not present

## 2019-01-25 DIAGNOSIS — I130 Hypertensive heart and chronic kidney disease with heart failure and stage 1 through stage 4 chronic kidney disease, or unspecified chronic kidney disease: Secondary | ICD-10-CM | POA: Diagnosis not present

## 2019-01-25 LAB — CBC WITH DIFFERENTIAL/PLATELET
Abs Immature Granulocytes: 0.02 10*3/uL (ref 0.00–0.07)
Basophils Absolute: 0 10*3/uL (ref 0.0–0.1)
Basophils Relative: 0 %
Eosinophils Absolute: 0.3 10*3/uL (ref 0.0–0.5)
Eosinophils Relative: 3 %
HCT: 39.9 % (ref 36.0–46.0)
Hemoglobin: 12.4 g/dL (ref 12.0–15.0)
Immature Granulocytes: 0 %
Lymphocytes Relative: 31 %
Lymphs Abs: 2.5 10*3/uL (ref 0.7–4.0)
MCH: 26.8 pg (ref 26.0–34.0)
MCHC: 31.1 g/dL (ref 30.0–36.0)
MCV: 86.4 fL (ref 80.0–100.0)
Monocytes Absolute: 0.8 10*3/uL (ref 0.1–1.0)
Monocytes Relative: 10 %
Neutro Abs: 4.6 10*3/uL (ref 1.7–7.7)
Neutrophils Relative %: 56 %
Platelets: 232 10*3/uL (ref 150–400)
RBC: 4.62 MIL/uL (ref 3.87–5.11)
RDW: 14.8 % (ref 11.5–15.5)
WBC: 8.2 10*3/uL (ref 4.0–10.5)
nRBC: 0 % (ref 0.0–0.2)

## 2019-01-25 LAB — BASIC METABOLIC PANEL
Anion gap: 11 (ref 5–15)
BUN: 16 mg/dL (ref 6–20)
CO2: 30 mmol/L (ref 22–32)
Calcium: 9.3 mg/dL (ref 8.9–10.3)
Chloride: 101 mmol/L (ref 98–111)
Creatinine, Ser: 1.41 mg/dL — ABNORMAL HIGH (ref 0.44–1.00)
GFR calc Af Amer: 47 mL/min — ABNORMAL LOW (ref >60)
GFR calc non Af Amer: 41 mL/min — ABNORMAL LOW (ref >60)
Glucose, Bld: 102 mg/dL — ABNORMAL HIGH (ref 70–99)
Potassium: 3.9 mmol/L (ref 3.5–5.1)
Sodium: 142 mmol/L (ref 135–145)

## 2019-01-25 LAB — ECHOCARDIOGRAM COMPLETE
Height: 64 in
Weight: 5592 oz

## 2019-01-25 LAB — TROPONIN I (HIGH SENSITIVITY)
Troponin I (High Sensitivity): 19 ng/L — ABNORMAL HIGH (ref <18)
Troponin I (High Sensitivity): 16 ng/L (ref <18)

## 2019-01-25 LAB — BRAIN NATRIURETIC PEPTIDE: B Natriuretic Peptide: 61.8 pg/mL (ref 0.0–100.0)

## 2019-01-25 MED ORDER — ACETAMINOPHEN 325 MG PO TABS
650.0000 mg | ORAL_TABLET | ORAL | Status: DC | PRN
  Administered 2019-01-25: 650 mg via ORAL
  Filled 2019-01-25: qty 2

## 2019-01-25 MED ORDER — BUPROPION HCL ER (XL) 150 MG PO TB24
150.0000 mg | ORAL_TABLET | Freq: Every morning | ORAL | Status: DC
  Administered 2019-01-25 – 2019-01-26 (×2): 150 mg via ORAL
  Filled 2019-01-25 (×2): qty 1

## 2019-01-25 MED ORDER — PANTOPRAZOLE SODIUM 40 MG PO TBEC
40.0000 mg | DELAYED_RELEASE_TABLET | Freq: Every day | ORAL | Status: DC
  Administered 2019-01-25 – 2019-01-26 (×2): 40 mg via ORAL
  Filled 2019-01-25 (×2): qty 1

## 2019-01-25 MED ORDER — MIRTAZAPINE 15 MG PO TABS
30.0000 mg | ORAL_TABLET | Freq: Every day | ORAL | Status: DC
  Administered 2019-01-25: 21:00:00 30 mg via ORAL
  Filled 2019-01-25: qty 2

## 2019-01-25 MED ORDER — RISPERIDONE 3 MG PO TABS
3.0000 mg | ORAL_TABLET | Freq: Two times a day (BID) | ORAL | Status: DC
  Administered 2019-01-25 – 2019-01-26 (×2): 3 mg via ORAL
  Filled 2019-01-25: qty 1
  Filled 2019-01-25: qty 3
  Filled 2019-01-25: qty 1
  Filled 2019-01-25: qty 3
  Filled 2019-01-25 (×2): qty 1

## 2019-01-25 MED ORDER — SODIUM CHLORIDE 0.9% FLUSH: 10.0000 mL | INTRAVENOUS | Status: DC | PRN

## 2019-01-25 MED ORDER — FUROSEMIDE 10 MG/ML IJ SOLN
40.0000 mg | Freq: Two times a day (BID) | INTRAMUSCULAR | Status: DC
  Administered 2019-01-25: 40 mg via INTRAVENOUS
  Filled 2019-01-25: qty 4

## 2019-01-25 MED ORDER — SODIUM CHLORIDE 0.9 % IV SOLN: 250.0000 mL | INTRAVENOUS | Status: DC | PRN

## 2019-01-25 MED ORDER — SODIUM CHLORIDE 0.9% FLUSH
3.0000 mL | INTRAVENOUS | Status: DC | PRN
  Administered 2019-01-25: 3 mL via INTRAVENOUS
  Filled 2019-01-25: qty 3

## 2019-01-25 MED ORDER — FLUOXETINE HCL 20 MG PO CAPS
40.0000 mg | ORAL_CAPSULE | Freq: Every morning | ORAL | Status: DC
  Administered 2019-01-25 – 2019-01-26 (×2): 40 mg via ORAL
  Filled 2019-01-25 (×2): qty 2

## 2019-01-25 MED ORDER — TRAZODONE HCL 100 MG PO TABS
100.0000 mg | ORAL_TABLET | Freq: Every day | ORAL | Status: DC
  Administered 2019-01-25: 100 mg via ORAL
  Filled 2019-01-25: qty 1

## 2019-01-25 MED ORDER — ASPIRIN EC 81 MG PO TBEC
81.0000 mg | DELAYED_RELEASE_TABLET | Freq: Every day | ORAL | Status: DC
  Administered 2019-01-25 – 2019-01-26 (×2): 81 mg via ORAL
  Filled 2019-01-25 (×2): qty 1

## 2019-01-25 MED ORDER — BENZTROPINE MESYLATE 0.5 MG PO TABS
0.2500 mg | ORAL_TABLET | Freq: Two times a day (BID) | ORAL | Status: DC
  Administered 2019-01-25 – 2019-01-26 (×2): 0.25 mg via ORAL
  Filled 2019-01-25 (×4): qty 1

## 2019-01-25 MED ORDER — PRAZOSIN HCL 1 MG PO CAPS
1.0000 mg | ORAL_CAPSULE | Freq: Every day | ORAL | Status: DC
  Administered 2019-01-25 – 2019-01-26 (×2): 1 mg via ORAL
  Filled 2019-01-25 (×3): qty 1

## 2019-01-25 MED ORDER — PREGABALIN 25 MG PO CAPS
50.0000 mg | ORAL_CAPSULE | Freq: Every day | ORAL | Status: DC
  Administered 2019-01-25: 50 mg via ORAL
  Filled 2019-01-25: qty 2

## 2019-01-25 MED ORDER — CARVEDILOL 25 MG PO TABS
25.0000 mg | ORAL_TABLET | Freq: Two times a day (BID) | ORAL | Status: DC
  Administered 2019-01-25 – 2019-01-26 (×3): 25 mg via ORAL
  Filled 2019-01-25 (×3): qty 1

## 2019-01-25 MED ORDER — ENOXAPARIN SODIUM 100 MG/ML ~~LOC~~ SOLN
90.0000 mg | Freq: Every day | SUBCUTANEOUS | Status: DC
  Administered 2019-01-25 – 2019-01-26 (×2): 90 mg via SUBCUTANEOUS
  Filled 2019-01-25 (×2): qty 1

## 2019-01-25 MED ORDER — ONDANSETRON HCL 4 MG/2ML IJ SOLN: 4.0000 mg | Freq: Four times a day (QID) | INTRAMUSCULAR | Status: DC | PRN

## 2019-01-25 NOTE — Evaluation (Signed)
Physical Therapy Evaluation Patient Details Name: Lauren Wood MRN: [id number] DOB: 12/27/1960 Today's Date: 01/25/2019   History of Present Illness  Lauren Wood is a 58 y.o. female presenting with about 1 week of SOB. PMH is significant for intellectual disability, HFpEF, sleep apnea, hypertension, obesity.  Clinical Impression  Patient evaluated by Physical Therapy with no further acute PT needs identified. All education has been completed and the patient has no further questions. Pt ambulated 100' with SPC and min HHA, was min-guard A with RW, encouraged her to use it for a couple of weeks when first returning home. SPO2 96% on RA before and after ambulation, HR 84 bpm after ambulation. Pt very near to baseline with mobility.  See below for any follow-up Physical Therapy or equipment needs. PT is signing off. Thank you for this referral.     Follow Up Recommendations No PT follow up    Equipment Recommendations  None recommended by PT    Recommendations for Other Services       Precautions / Restrictions Precautions Precautions: Other (comment) Precaution Comments: watch O2 sats Restrictions Weight Bearing Restrictions: No      Mobility  Bed Mobility Overal bed mobility: Modified Independent             General bed mobility comments: pt able to come to EOB with increased time but no physical assist  Transfers Overall transfer level: Modified independent Equipment used: Rolling walker (2 wheeled);Straight cane             General transfer comment: pt stood from bed and BSC without assist with use of RW and cane  Ambulation/Gait Ambulation/Gait assistance: Min guard;Min assist Gait Distance (Feet): 100 Feet Assistive device: Rolling walker (2 wheeled);Straight cane Gait Pattern/deviations: Step-through pattern;Wide base of support Gait velocity: decreased Gait velocity interpretation: <1.8 ft/sec, indicate of risk for recurrent falls General Gait  Details: pt ambulated first with cane, seemed mildly unsteady so HHA given opposrite side. WIthin room, pt used RW and was min-guard with this. Encourged her to use RW for a few weeks while returning fully to baseline.   Stairs            Wheelchair Mobility    Modified Rankin (Stroke Patients Only)       Balance Overall balance assessment: Mild deficits observed, not formally tested                                           Pertinent Vitals/Pain Pain Assessment: No/denies pain    Home Living Family/patient expects to be discharged to:: Private residence Living Arrangements: Other relatives Available Help at Discharge: Family;Available 24 hours/day Type of Home: Apartment Home Access: Level entry     Home Layout: One level Home Equipment: Walker - 2 wheels;Cane - single point Additional Comments: pt lives with her sister, Lauren Wood    Prior Function Level of Independence: Independent with assistive device(s)         Comments: ambulates with cane usually but has a RW under her bed that she can ask her sister to get for her. Attends a daycare program M-F.     Hand Dominance        Extremity/Trunk Assessment   Upper Extremity Assessment Upper Extremity Assessment: Overall WFL for tasks assessed    Lower Extremity Assessment Lower Extremity Assessment: Overall WFL for tasks assessed    Cervical /  Trunk Assessment Cervical / Trunk Assessment: Other exceptions Cervical / Trunk Exceptions: morbidly obese, noted effects on posture  Communication   Communication: No difficulties  Cognition Arousal/Alertness: Awake/alert Behavior During Therapy: WFL for tasks assessed/performed Overall Cognitive Status: History of cognitive impairments - at baseline                                        General Comments General comments (skin integrity, edema, etc.): pt reports that she makes herself walk to the park eaxh day even though she  doesn't like to. Encouraged her to keep doing this    Exercises     Assessment/Plan    PT Assessment Patent does not need any further PT services  PT Problem List         PT Treatment Interventions      PT Goals (Current goals can be found in the Care Plan section)  Acute Rehab PT Goals Patient Stated Goal: go home PT Goal Formulation: All assessment and education complete, DC therapy    Frequency     Barriers to discharge        Co-evaluation               AM-PAC PT "6 Clicks" Mobility  Outcome Measure Help needed turning from your back to your side while in a flat bed without using bedrails?: None Help needed moving from lying on your back to sitting on the side of a flat bed without using bedrails?: None Help needed moving to and from a bed to a chair (including a wheelchair)?: None Help needed standing up from a chair using your arms (e.g., wheelchair or bedside chair)?: None Help needed to walk in hospital room?: A Little Help needed climbing 3-5 steps with a railing? : A Little 6 Click Score: 22    End of Session Equipment Utilized During Treatment: Gait belt Activity Tolerance: Patient tolerated treatment well Patient left: in chair;with call bell/phone within reach Nurse Communication: Mobility status PT Visit Diagnosis: Other abnormalities of gait and mobility (R26.89)    Time: 1430-1450 PT Time Calculation (min) (ACUTE ONLY): 20 min   Charges:   PT Evaluation $PT Eval Moderate Complexity: [place], [place]  Pager [phone] Office [place] 01/25/2019, 2:57 PM

## 2019-01-25 NOTE — H&P (Addendum)
[facility] Admission History and Physical Service Pager: [phone]  Patient name: Lauren Wood Medical record number: [id number] Date of birth: [birth year] Age: 58 y.o. Gender: female  Primary Care Provider: [name], MD Consultants: None Code Status: Full Emergency contact: Sister  Chief Complaint: Shortness of breath  Assessment and Plan: Lauren Wood is a 58 y.o. female presenting with about 1 week of SOB. PMH is significant for intellectual disability, HFpEF, sleep apnea, hypertension, obesity.   Acute on chronic HFpEF Has known HFpEF (55-65% in 2014, LVEF normal to mildly depressed with hypokinesis of the inferior andseptal walls. Septal wall motion may reflect conduction delay, 2019.). She came into the hospital after being in clinic. On 9/10 she received IV Lasix in clinic and was asked to increase home diuretic. She did not receive any increased diuretics, and when she returned for follow up on 9/11 she was noted to be tachypneic, hypoxic to 80's and tachycardic. They sent her to the ED for a PE rule out as well as continued diuresis. She was placed on 2L [place] and was satting 96%, with ambulation she only desatted to 88-89% on RA, but patient was also previously sleeping and normally sleeps on Bipap. On admission her vitals were all stable.  Physical exam was remarkable for no significant lower extremity edema. No crackles noted on exam. However difficult exam 2/2 body habitus. Her admission labs are remarkable for BNP 61, however patient is morbidly obese, troponin 16>19- in setting of demand ischemia, creatinine 1.24 (at baseline), potassium 4.0.  Her chest x-ray was remarkable for low lung volumes with bibasilar atelectasis. CTA significant for cardiomegaly and mild pulmonary edema with no PE. No history of ACS. Her EKG showed no ST or T wave changes.  Pneumonia seems unlikely at this time due to lack of infectious symptoms, elevated WBC and x-ray  findings. Likely that some aspect of restrictive lung disease d/t obesity and OSA has contributed to pulmonary vascular congestion. Patient's sister and caregiver wanted her admitted for further monitoring given that she felt forgotten in the ED hallway overnight.  -Place in observation, attending Dr. Andria Wood -Cardiac monitoring -Begin Lasix IV 40 mg twice daily -Strict I's and O's, Daily weights -BiPAP at night -Albuterol nebulizers every 2 hours PRN -Supplemental oxygen PRN, wean as tolerated, goal 88-92% -Monitor vitals -PT/OT eval and treat   Hypertension-her chronic hypertension is managed with carvedilol 25 mg twice daily.  On admission her blood pressure was remarkable for systolics up to [id number].  We will be holding her beta-blocker in the setting of acute decompensated heart failure.  We will hold losartan in the setting of potential aggressive diuresis. -Hold carvedilol -Hold losartan -Continue spironolactone -Monitor BP   CKD stage II-baseline creatinine appears to be around 1.2.  Creatinine on admission is 1.24.  We will continue to monitor the setting of diuresis. -Trend BMP / urinary output -Replace electrolytes as indicated -Avoid nephrotoxic agents, ensure adequate renal perfusion  Mental retardation, borderline personality disorder, mental retardation-Lauren Wood has a number of psychiatric conditions which are managed by behavioral health provider (Lauren Wood).  Her current medications include bupropion, fluoxetine, cogentin, mirtazapine, risperidone, trazodone. -Continue home medications   GERD-Home medication includes omeprazole 20 mg daily.  She is not currently endorsing any symptoms of GERD. -Tonics 40 mg daily, on formulary   Sleep apnea-BiPAP use at home.  -BiPAP ordered  Incisional pulmonary nodules CTA with incidental pulmonary nodules that will need follow-up in 12 months.  FEN/GI: Protonix, heart healthy Prophylaxis: Lovenox   Disposition: Place in  observation, wean to room air  History of Present Illness:  Lauren Wood is a 58 y.o. female presenting with shortness of breath since Monday.  Patient was seen in the clinic on Thursday and Friday.  On Thursday she received IV Lasix and sister was supposed to increase her Lasix to twice daily.  However sister was unable to pick up her prescriptions that she never had her increased dose.  She would then return to clinic today and was sent over to the ED for further evaluation of her increased work of breathing as well as her desaturation.  Patient initially in the ED required oxygen.  They also attempted to obtain a CTA but had complications obtaining patient's venous access.  Patient received IV Lasix 40 in the ED with good response.  She was then ambulated in the hallway and desatted to 88 to 89%.  However sister reports that they have been sitting in the hallway all day and felt that she may benefit from at least coming in today.  She denies any fevers, chills, cough, shortness of breath, lower extremity edema.  Sister reports that she previously had lower extremity edema but has much improved this morning.  She states that she normally wears a BiPAP every night.  She is currently sitting in the hallway asleep on 2 L nasal cannula which could have also contributed to her desaturations.  Review Of Systems: Per HPI with the following additions:   Review of Systems  Constitutional: Negative for chills and fever.  HENT: Negative for congestion and sore throat.   Eyes: Negative for blurred vision and double vision.  Respiratory: Negative for cough, sputum production and shortness of breath.   Cardiovascular: Negative for chest pain and leg swelling.  Gastrointestinal: Negative for constipation, diarrhea, nausea and vomiting.  Genitourinary: Negative for dysuria and urgency.  Musculoskeletal: Negative for joint pain.  Neurological: Negative for dizziness and headaches.    Patient Active Problem  List   Diagnosis Date Noted  . CHF exacerbation ([place]) 01/25/2019  . Acute on chronic diastolic HF (heart failure) ([place]) 07/01/2018  . Obesity hypoventilation syndrome ([place])   . Intellectual disability   . CHF (congestive heart failure) ([place]) 05/10/2018  . Diverticulosis of colon 11/22/2016  . Restless leg syndrome 09/05/2016  . Urinary incontinence 12/24/2014  . Gout of foot 09/26/2013  . Onychomycosis 09/25/2013  . Personal history of colonic polyps 06/24/2013  . Health care maintenance 02/11/2013  . Hearing loss in left ear 02/11/2013  . OSA (obstructive sleep apnea) 12/11/2012  . Borderline personality disorder ([place]) 05/31/2011  . CKD (chronic kidney disease) stage 3, GFR 30-59 ml/min (HCC) 02/06/2011  . Psychosis ([place]) 12/20/2009  . Morbid obesity ([place]) 08/02/2009  . MENTAL RETARDATION 08/02/2009  . Essential hypertension, benign 08/02/2009  . GERD 08/02/2009  . OSTEOARTHRITIS, MULTIPLE JOINTS 08/02/2009  . SLEEP DISORDER 08/02/2009    Past Medical History: Past Medical History:  Diagnosis Date  . ALLERGIC RHINITIS 08/02/2009  . Arthritis   . Boil of vulva 11/07/2012  . Borderline personality disorder ([place]) 05/31/2011  . CKD (chronic kidney disease) stage 3, GFR 30-59 ml/min (HCC)    chronic  . Gout of foot 09/26/2013   L foot 09/2013. In the setting of HCTZ 25 mg and lasix 40 mg daily. Stopped HCTZ after decreasing to 12.5 mg. Treated with colchicine.    Marland Kitchen Heart failure (HCC)    chronic  . Heart  murmur   . Hip pain 12/03/2012  . HTN (hypertension)   . 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 Obesity   . 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 PRESSURE ULCER OTHER SITE 10/01/2009   hx of  . Sleep apnea    uses cpap, setting of 2  . Unspecified psychosis 12/20/2009    Past Surgical History: Past Surgical History:   Procedure Laterality Date  . COLONOSCOPY N/A 06/24/2013   Procedure: COLONOSCOPY;  Surgeon: Gatha Mayer, MD;  Location: [facility] ENDOSCOPY;  Service: Endoscopy;  Laterality: N/A;  . COLONOSCOPY WITH PROPOFOL N/A 10/05/2015   Procedure: COLONOSCOPY WITH PROPOFOL;  Surgeon: Gatha Mayer, MD;  Location: [facility] ENDOSCOPY;  Service: Endoscopy;  Laterality: N/A;  . cyst removed  2011   forehead  . TYMPANOSTOMY TUBE PLACEMENT Bilateral yrs ago    Social History: Social History   Tobacco Use  . Smoking status: Never Smoker  . Smokeless tobacco: Never Used  Substance Use Topics  . Alcohol use: No    Alcohol/week: 0.0 standard drinks  . Drug use: No   Additional social history: Sister is caregiver Please also refer to relevant sections of EMR.  Family History: Family History  Problem Relation Age of Onset  . Colon cancer Father        around age 47  . Diabetes Mother   . Esophageal cancer Neg Hx   . Pancreatic cancer Neg Hx   . Stomach cancer Neg Hx   . Liver disease Neg Hx     Allergies and Medications: Allergies  Allergen Reactions  . Enalapril Maleate Other (See Comments)    Reaction unknown ??  . Hctz [Hydrochlorothiazide] Other (See Comments)    Hyperuricemia with Lasix   . Lasix [Furosemide] Other (See Comments)    Hyperuricemia with HCTZ     No current facility-administered medications on file prior to encounter.    Current Outpatient Medications on File Prior to Encounter  Medication Sig Dispense Refill  . acetaminophen (TYLENOL) 500 MG tablet Take 500-1,000 mg by mouth every 6 (six) hours as needed (for headaches).    Marland Kitchen aspirin (QC LO-DOSE ASPIRIN) 81 MG EC tablet Take 1 tablet (81 mg total) by mouth daily. Swallow whole. 30 tablet 11  . benztropine (COGENTIN) 0.5 MG tablet Take 0.25 mg by mouth 2 (two) times daily.    Marland Kitchen buPROPion (WELLBUTRIN XL) 150 MG 24 hr tablet Take 150 mg by mouth every morning.    . carvedilol (COREG) 25 MG tablet Take 1 tablet (25 mg total) by  mouth 2 (two) times daily with a meal. 180 tablet 3  . DOK 100 MG capsule TAKE ONE CAPSULE TWICE DAILY AS NEEDED FOR MILD OR MODERATE CONSTIPATION (Patient taking differently: Take 100 mg by mouth 2 (two) times daily as needed for mild constipation or moderate constipation. ) 60 capsule 0  . FLUoxetine (PROZAC) 40 MG capsule Take 40 mg by mouth every morning.    . furosemide (LASIX) 40 MG tablet Take 1 tablet (40 mg total) by mouth daily. 30 tablet 2  . meloxicam (MOBIC) 15 MG tablet Take 1 tablet (15 mg total) by mouth daily as needed for pain. Daily for 4-5 days (with food), then daily as needed for knee pain (Patient taking differently: Take 15 mg by mouth  daily as needed (for knee pain- take with food). ) 10 tablet 0  . mirtazapine (REMERON) 30 MG tablet Take 30 mg by mouth at bedtime.     Marland Kitchen omeprazole (PRILOSEC) 20 MG capsule TAKE ONE CAPSULE EACH DAY (Patient taking differently: Take 20 mg by mouth daily. ) 90 capsule 2  . prazosin (MINIPRESS) 1 MG capsule Take 1 mg by mouth daily.     . pregabalin (LYRICA) 50 MG capsule Take 1 capsule (50 mg total) by mouth See admin instructions. Take 50 mg by mouth 1-3 hours before bedtime 90 capsule 0  . risperiDONE (RISPERDAL) 3 MG tablet Take 3 mg by mouth 2 (two) times daily.     . traZODone (DESYREL) 100 MG tablet Take 100 mg by mouth at bedtime.       Objective: BP (!) 147/75 (BP Location: Left Wrist)   Pulse 72   Temp 98.4 F (36.9 C) (Oral)   Resp 16   Ht 5\' 4"  (1.626 m)   Wt (!) 181.4 kg   LMP 05/08/2013   SpO2 100%   BMI 68.66 kg/m  Exam: General: NAD, pleasant, obese laying in bed on 2 L [place] Eyes: PERRL, EOMI, no conjunctival pallor or injection ENTM: Moist mucous membranes, no pharyngeal erythema or exudate Neck: Supple, no LAD Cardiovascular: RRR, no m/r/g, no LE edema Respiratory: CTA BL, normal work of breathing- on 2L [place] Gastrointestinal: soft, nontender, nondistended, normoactive BS MSK: moves 4 extremities equally Derm: no  rashes appreciated Neuro: CN II-XII grossly intact Psych: AOx3, appropriate affect  Labs and Imaging: CBC BMET  Recent Labs  Lab 01/25/19 0300  WBC 8.2  HGB 12.4  HCT 39.9  PLT 232   Recent Labs  Lab 01/24/19 2100  NA 143  K 4.0  CL 103  CO2 26  BUN 16  CREATININE 1.24*  GLUCOSE 91  CALCIUM 9.4     Dg Chest 2 View  Result Date: 01/24/2019 CLINICAL DATA:  Patient with shortness of breath. EXAM: CHEST - 2 VIEW COMPARISON:  Chest radiograph 05/10/2018 FINDINGS: Monitoring leads overlie the patient. Stable cardiomegaly. Low lung volumes. Bibasilar heterogeneous pulmonary opacities. Thoracic spine degenerative changes. IMPRESSION: Cardiomegaly.  Low lung volumes with bibasilar atelectasis. Electronically Signed   By: Lovey Newcomer M.D.   On: 01/24/2019 19:01   Ct Angio Chest Pe W And/or Wo Contrast  Result Date: 01/25/2019 CLINICAL DATA:  58 year old female with clinical suspicion of pulmonary embolism. Positive D-dimer. EXAM: CT ANGIOGRAPHY CHEST WITH CONTRAST TECHNIQUE: Multidetector CT imaging of the chest was performed using the standard protocol during bolus administration of intravenous contrast. Multiplanar CT image reconstructions and MIPs were obtained to evaluate the vascular anatomy. CONTRAST:  182mL OMNIPAQUE IOHEXOL 350 MG/ML SOLN COMPARISON:  No priors. FINDINGS: Cardiovascular: No filling defect within the pulmonary arterial tree to suggest underlying pulmonary embolism. Heart size is enlarged with left ventricular dilatation. There is no significant pericardial fluid, thickening or pericardial calcification. Aortic atherosclerosis. No definite coronary artery calcifications. Mediastinum/Nodes: No pathologically enlarged mediastinal or hilar lymph nodes. Esophagus is unremarkable in appearance. No axillary lymphadenopathy. Lungs/Pleura: Mild ground-glass attenuation and interlobular septal thickening noted in the lungs bilaterally, suggesting a background of mild interstitial  pulmonary edema. No confluent consolidative airspace disease. No pleural effusions. Small 4 mm right lower lobe nodule (axial image 64 of series 12). Upper Abdomen: Unremarkable. Musculoskeletal: There are no aggressive appearing lytic or blastic lesions noted in the visualized portions of the skeleton. Review of the MIP images confirms the  above findings. IMPRESSION: 1. No evidence of pulmonary embolism. 2. Cardiomegaly with left ventricular dilatation and evidence of mild interstitial pulmonary edema; imaging findings suggestive of mild congestive heart failure. 3. 4 mm right lower lobe pulmonary nodule (axial image 64 of series 12). This is nonspecific, but statistically likely benign. No follow-up needed if patient is low-risk. Non-contrast chest CT can be considered in 12 months if patient is high-risk. This recommendation follows the consensus statement: Guidelines for Management of Incidental Pulmonary Nodules Detected on CT Images: From the Fleischner Society 2017; Radiology 2017; 284:228-243. 4. Aortic atherosclerosis. Aortic Atherosclerosis (ICD10-I70.0). Electronically Signed   By: Vinnie Langton M.D.   On: 01/25/2019 05:04    Carle, [place], DO 01/25/2019, 6:44 AM PGY-3, [place] Intern pager: [phone], text pages welcome

## 2019-01-25 NOTE — ED Notes (Signed)
Report called to 3E-- Shirlee Limerick RN

## 2019-01-25 NOTE — Progress Notes (Signed)
  Echocardiogram 2D Echocardiogram has been performed.  Lauren Wood 01/25/2019, 11:51 AM

## 2019-01-25 NOTE — ED Notes (Signed)
  Patient ambulated to bathroom on pulse oximetry.  Patient SPO2 dropped to 88-89% when ambulating without O2.  Patient was 96% on 2L O2.  Patient seemed out of breath but ambulated with cane with only standby assist.

## 2019-01-25 NOTE — ED Notes (Signed)
Denies chest pain at present.

## 2019-01-25 NOTE — ED Provider Notes (Signed)
00:10: Assumed care of patient from [facility] PA-C at change of shift pending CTA & trial of ambulation following Lasix.   Please see prior provider note for full H&P.  Briefly patient is a 58 year old female with a history of CHF, psychiatric conditions, and MR who presents to the emergency department with complaints of dyspnea on exertion for the past 5 days.  Seen by her primary care provider, given IM Lasix, instructed to increase dose which she has not done yet.  He was seen for follow-up, had improvement in her weight, but persistent symptoms and with into the emergency department.  Noted to have borderline oxygen at rest of 89 to 91% per prior provider at times. Concern was for CHF exacerbation.  Consult was placed to family practice service, requesting trial of IV Lasix with repeat ambulation and for CTA results prior to admission.  Her work-up has been reviewed: CBC: No leukocytosis or anemia BMP: Renal function similar to prior Troponins: 16, 19, no significant increase. D-dimer: Mildly elevated at 0.61 COVID: Negative  Significant difficulty with IV access throughout emergency department visit, multiple IV team consult, ultimately was able to obtain CTA-no PE, cardiomegaly with LV dilation with findings of mild interstitial pulmonary edema consistent with mild congestive heart failure.  Repeat trial of ambulation status post Lasix, SPO2 dropped to 88 and 89% with increased work of breathing, improving placed back on 2 L.  08:08: CONSULT: Discussed with family practice service, Dr. Enid Derry,  will come to the emergency department to evaluate the patient and determine if they feel admission is necessary.         Amaryllis Dyke, PA-C 01/25/19 [id number]    Veryl Speak, MD 01/25/19 2316

## 2019-01-26 DIAGNOSIS — R918 Other nonspecific abnormal finding of lung field: Secondary | ICD-10-CM

## 2019-01-26 DIAGNOSIS — I5043 Acute on chronic combined systolic (congestive) and diastolic (congestive) heart failure: Secondary | ICD-10-CM | POA: Diagnosis not present

## 2019-01-26 DIAGNOSIS — I130 Hypertensive heart and chronic kidney disease with heart failure and stage 1 through stage 4 chronic kidney disease, or unspecified chronic kidney disease: Secondary | ICD-10-CM | POA: Diagnosis not present

## 2019-01-26 LAB — BASIC METABOLIC PANEL
Anion gap: 13 (ref 5–15)
BUN: 19 mg/dL (ref 6–20)
CO2: 28 mmol/L (ref 22–32)
Calcium: 9.3 mg/dL (ref 8.9–10.3)
Chloride: 99 mmol/L (ref 98–111)
Creatinine, Ser: 1.35 mg/dL — ABNORMAL HIGH (ref 0.44–1.00)
GFR calc Af Amer: 50 mL/min — ABNORMAL LOW (ref >60)
GFR calc non Af Amer: 43 mL/min — ABNORMAL LOW (ref >60)
Glucose, Bld: 90 mg/dL (ref 70–99)
Potassium: 3.7 mmol/L (ref 3.5–5.1)
Sodium: 140 mmol/L (ref 135–145)

## 2019-01-26 MED ORDER — FUROSEMIDE 80 MG PO TABS: 80.0000 mg | ORAL_TABLET | Freq: Every day | ORAL | 0 refills | Status: DC

## 2019-01-26 MED ORDER — LOSARTAN POTASSIUM 25 MG PO TABS: 12.5000 mg | ORAL_TABLET | Freq: Every day | ORAL | 0 refills | Status: DC

## 2019-01-26 MED ORDER — FUROSEMIDE 80 MG PO TABS
80.0000 mg | ORAL_TABLET | Freq: Every day | ORAL | Status: DC
  Administered 2019-01-26: 80 mg via ORAL
  Filled 2019-01-26: qty 1

## 2019-01-26 MED ORDER — RISPERIDONE 3 MG PO TABS: 3.0000 mg | ORAL_TABLET | Freq: Two times a day (BID) | ORAL | 0 refills | Status: DC

## 2019-01-26 NOTE — Care Management Obs Status (Signed)
[place] NOTIFICATION   Patient Details  Name: Lauren Wood MRN: [id number] Date of Birth: 22-May-1960   Medicare Observation Status Notification Given:  Yes    Carles Collet, RN 01/26/2019, 2:08 PM

## 2019-01-26 NOTE — Progress Notes (Signed)
Family Medicine Teaching Service Daily Progress Note Intern Pager: [phone]  Patient name: Lauren Wood Medical record number: [id number] Date of birth: 1960/09/20 Age: 58 y.o. Gender: female  Primary Care Provider: [name], MD Consultants: none Code Status: full  Pt Overview and Major Events to Date:  9/12 admitted 9/13 - weaned to room air.   Assessment and Plan:  Acute Hypoxic Respiratory Failure likely 2/2 HFpEF exacerbation - patient was out of lasix at home. previous echo showed EF 55-65%, now 40-45% with impaired relaxation. No PE seen on CTA.  No ST changes on EKG.  Diuresing with IV lasix 40mg , has put out 1119ml so far.  On exam, it is difficult to auscultate breath sounds d/t body habitus, but no crackles/wheezes were heard.  Patient was sitting up in bed on room air. .   - IV lasix, switch to PO 80 daily  - consult cards - for recs and follow up coordination - cardiac monitoring - strict I/O, daily weights - Bipap at night - albuterol nebs q2h prn  - vitals per routine  HTN - elevated on admission, currently normotensive. Uncertain if she is actually taking spiro or losartan.  Last refill date on losartan was April 2020 for one month supply. Last spiro refill I could find was january - continue carvedilol 25mg  Bid - continue to hold spiro/losartan, could restart losartan even if not taking it currently given her HFrEF, but  Should start at 25mg  if not currently taking given her BP levels  AKI on CKD II - baseline Cr ~ 1.2. on admission: 1.41>1.35.  - avoid nephrotoxic agents  Psychiatric conditions - mental retardation, borderline personality disorder.  Managed by Dr. Redmond Baseman outpatient.  On bupropion, fluoxetine, cogentin, mirtazapine, risperidone, trazodone,  - continue home meds.   GERD - on omeprazole 20mg  daily at home.  - pantoprazole 40mg  daily  Sleep apnea - bipap at home - continue bipap  Pulmonary nodule - incidental.  Seen on CTA.  - f/u CT in 12  months.    FEN/GI: protonix, heart healthy diet PPx: lovenox  Disposition: home today pending cards.   Subjective:  Patient feels well today.  She does not have any difficulty breathing. No pain.  She would like to go home today.   Objective: Temp:  [98.2 F (36.8 C)-99.1 F (37.3 C)] 98.4 F (36.9 C) (09/13 0454) Pulse Rate:  [65-82] 82 (09/13 0454) Resp:  [15-18] 16 (09/13 0454) BP: (104-153)/(60-99) 120/60 (09/13 0454) SpO2:  [64 %-99 %] 93 % (09/13 0454) Weight:  [158.4 kg-158.5 kg] 158.4 kg (09/13 0454) Physical Exam: General: alert. No acute distress.  Sitting up in chair on room air.  Morbidly obese.  Cardiovascular: distant heart sounds.  Respiratory: LCTAB.  No wheezes or crackles.  Abdomen: soft, nontender. Normal bowel sounds.  Extremities: no edema.    Laboratory: Recent Labs  Lab 01/22/19 1708 01/25/19 0300  WBC 8.6 8.2  HGB 13.0 12.4  HCT 40.3 39.9  PLT 231 232   Recent Labs  Lab 01/22/19 1708 01/24/19 2100 01/25/19 0942  NA 147* 143 142  K 4.1 4.0 3.9  CL 103 103 101  CO2 28 26 30   BUN 19 16 16   CREATININE 1.23* 1.24* 1.41*  CALCIUM 9.5 9.4 9.3  PROT 7.3  --   --   BILITOT 0.2  --   --   ALKPHOS 158*  --   --   ALT 8  --   --   AST 17  --   --  GLUCOSE 83 91 102*     Imaging/Diagnostic Tests: CTA  1. No evidence of pulmonary embolism. 2. Cardiomegaly with left ventricular dilatation and evidence of mild interstitial pulmonary edema; imaging findings suggestive of mild congestive heart failure. 3. 4 mm right lower lobe pulmonary nodule (axial image 64 of series 12). This is nonspecific, but statistically likely benign. No follow-up needed if patient is low-risk. Non-contrast chest CT can be considered in 12 months if patient is high-risk. This recommendation follows the consensus statement: Guidelines for Management of Incidental Pulmonary Nodules Detected on CT Images: From the Fleischner Society 2017; Radiology 2017;  284:228-243. 4. Aortic atherosclerosis.  TTE 1. No evidence of pulmonary embolism. 2. Cardiomegaly with left ventricular dilatation and evidence of mild interstitial pulmonary edema; imaging findings suggestive of mild congestive heart failure. 3. 4 mm right lower lobe pulmonary nodule (axial image 64 of series 12). This is nonspecific, but statistically likely benign. No follow-up needed if patient is low-risk. Non-contrast chest CT can be considered in 12 months if patient is high-risk. This recommendation follows the consensus statement: Guidelines for Management of Incidental Pulmonary Nodules Detected on CT Images: From the Fleischner Society 2017; Radiology 2017; 284:228-243. 4. Aortic atherosclerosis.  Benay Pike, MD 01/26/2019, 6:05 AM PGY-2, [place] Intern pager: [phone], text pages welcome

## 2019-01-26 NOTE — Evaluation (Signed)
Occupational Therapy Evaluation and Discharge Patient Details Name: Lauren Wood MRN: [id number] DOB: [birth year] Today's Date: 01/26/2019    History of Present Illness Lauren Wood is a 58 y.o. female presenting with about 1 week of SOB-found to have .Acute hypoxic respiratory failure, likely due to acute exacerbation of heart failure with preserved EF. PMH is significant for intellectual disability, HFpEF, sleep apnea, hypertension, obesity.   Clinical Impression   This 58 yo female admitted with above presents to acute OT at an overall S level and has this at home from sister and attends a day program M-F. No further OT needs, we will sign off.    Follow Up Recommendations  No OT follow up;Supervision/Assistance - 24 hour    Equipment Recommendations  None recommended by OT       Precautions / Restrictions Precautions Precaution Comments: watch O2 sats (95% on RA today post activity) Restrictions Weight Bearing Restrictions: No      Mobility Bed Mobility               General bed mobility comments: Pt up in recliner upon arrival  Transfers Overall transfer level: Needs assistance Equipment used: Straight cane Transfers: Sit to/from Stand Sit to Stand: Supervision              Balance Overall balance assessment: Mild deficits observed, not formally tested                                         ADL either performed or assessed with clinical judgement   ADL                                         General ADL Comments: Overall S level with SPC and furniture surfing     Vision Patient Visual Report: No change from baseline              Pertinent Vitals/Pain Pain Assessment: No/denies pain     Hand Dominance Right   Extremity/Trunk Assessment Upper Extremity Assessment Upper Extremity Assessment: Overall WFL for tasks assessed           Communication Communication Communication: HOH(has hearing  aid)   Cognition Arousal/Alertness: Awake/alert Behavior During Therapy: WFL for tasks assessed/performed Overall Cognitive Status: History of cognitive impairments - at baseline                                                Home Living Family/patient expects to be discharged to:: Private residence Living Arrangements: Other relatives(sister) Available Help at Discharge: Family;Available 24 hours/day Type of Home: Apartment Home Access: Level entry     Home Layout: One level     Bathroom Shower/Tub: Tub/shower unit;Curtain   Bathroom Toilet: Standard Bathroom Accessibility: Yes   Home Equipment: Environmental consultant - 2 wheels;Cane - single point;Shower seat   Additional Comments: pt lives with her sister, [name]      Prior Functioning/Environment Level of Independence: Independent with assistive device(s)        Comments: ambulates with cane usually but has a RW under her bed that she can ask her sister to get for her. Attends a daycare program M-F. Doese  the laundry but does not cook        OT Problem List: Impaired balance (sitting and/or standing)(uses AD)         OT Goals(Current goals can be found in the care plan section) Acute Rehab OT Goals Patient Stated Goal: to go home and back to day program  OT Frequency:                AM-PAC OT "6 Clicks" Daily Activity     Outcome Measure Help from another person eating meals?: None Help from another person taking care of personal grooming?: A Little Help from another person toileting, which includes using toliet, bedpan, or urinal?: A Little Help from another person bathing (including washing, rinsing, drying)?: A Little Help from another person to put on and taking off regular upper body clothing?: A Little Help from another person to put on and taking off regular lower body clothing?: A Little 6 Click Score: 19   End of Session Equipment Utilized During Treatment: Gait belt(SPC)  Activity  Tolerance: Patient tolerated treatment well Patient left: in chair;with call bell/phone within reach;with chair alarm set  OT Visit Diagnosis: Unsteadiness on feet (R26.81)                [id number] OT Time Calculation (min): 25 min Charges:  OT General Charges $OT Visit: 1 Visit OT Evaluation $OT Eval Moderate Complexity: 1 Mod OT Treatments $Self Care/Home Management : 8-22 mins  [place], OTR/L Acute Rehab Services Pager [phone] Office [phone]     Almon Register 01/26/2019, 9:14 AM

## 2019-01-26 NOTE — Discharge Summary (Addendum)
[facility] Discharge Summary  Patient name: Lauren Wood Medical record number: [id number] Date of birth: Nov 12, 1960 Age: 59 y.o. Gender: female Date of Admission: 01/24/2019  Date of Discharge: 01/26/2019 Admitting Physician: Martyn Malay, MD  Primary Care Provider: [name], MD Consultants: none  Indication for Hospitalization: dyspnea  Discharge Diagnoses/Problem List:  HFmrEF Hypertension CKD stage III Intellectual disability OSA Morbid obesity Borderline personality disorder GERD Incidental pulmonary nodules  Disposition: Home  Discharge Condition: Stable  Discharge Exam:  Dr. Jeannine Kitten on day of discharge General: alert. No acute distress.  Sitting up in chair on room air.  Morbidly obese.  Cardiovascular: distant heart sounds.  Respiratory: LCTAB.  No wheezes or crackles.  Abdomen: soft, nontender. Normal bowel sounds.  Extremities: no edema.    Brief Hospital Course:  58 year old woman who presented after 1 week of dyspnea.  Patient was evaluated in clinic on 9/10 and 9/11 and received IV Lasix but was sent over from the office due to being hypoxic to the 80s as well as tachycardic.  In the ED she was evaluated for a PE with a CTA which showed cardiomegaly with mild pulmonary edema and no PE.  In the ED patient required 2L [place] and was satting at 96% and with ambulation desatted to 88 to 89% on room air.  She was given IV Lasix 40 mg and had good diuresis with this.  Her BNP was only elevated to 61 however she is morbidly obese.  Troponins were 16 and 19 consecutively with no active chest pain.    While admitted patient received 1 more dose of IV Lasix 40 mg and was transitioned to p.o. Lasix prior to discharge on 9/13 of 80 mg.  Home medication carvedilol was continued.  It was unclear if patient was actually taking the spironolactone at home so this was not continued.  Patient's losartan was decreased from 25 mg to 12.5 mg daily.   Patient had a repeat echocardiogram which showed slight decrease in EF from prior studies.  EF now 40 to 45% consistent with heart failure with mildly reduced ejection fraction.    Patient was quickly weaned to room air and was stable on her home regimen with BiPAP at night.  She is to follow-up with cardiology as an outpatient as they did not deem it necessary to see her in the hospital.  She also has follow-up with our clinic on 02/05/2019.  Patient was also evaluated by physical and occupational therapy while in the hospital who deemed no need for follow-up  Issues for Follow Up:  1. Discharged on Losartan 12.5mg  QD. Please adjust as indicated. 2. Recommend BMP at follow up to monitor electrolytes with increased dose of lasix 3. Unclear if patient taking Spironolactone. This was held at discharge. Consider restarting if blood pressure allows. 4. Incidental pulmonary nodule seen on CTA, require follow-up CT in 12 months.  Significant Procedures:  ECHO 01/26/2019:  1. Moderate hypokinesis of the left ventricular, apical apical segment.  2. The left ventricle has mild-moderately reduced systolic function, with an ejection fraction of 40-45%. The cavity size was normal. There is mildly increased left ventricular wall thickness. Left ventricular diastolic Doppler parameters are consistent  with impaired relaxation. Left ventricular diffuse hypokinesis.  3. The right ventricle has normal systolic function. The cavity was normal. There is no increase in right ventricular wall thickness.  4. Mild thickening of the mitral valve leaflet.  5. The aortic valve is grossly normal.  6. The aorta is normal unless otherwise noted.  Significant Labs and Imaging:  Recent Labs  Lab 01/22/19 1708 01/25/19 0300  WBC 8.6 8.2  HGB 13.0 12.4  HCT 40.3 39.9  PLT 231 232   Recent Labs  Lab 01/22/19 1708 01/24/19 2100 01/25/19 0942 01/26/19 0710  NA 147* 143 142 140  K 4.1 4.0 3.9 3.7  CL 103 103 101 99   CO2 28 26 30 28   GLUCOSE 83 91 102* 90  BUN 19 16 16 19   CREATININE 1.23* 1.24* 1.41* 1.35*  CALCIUM 9.5 9.4 9.3 9.3  ALKPHOS 158*  --   --   --   AST 17  --   --   --   ALT 8  --   --   --   ALBUMIN 4.0  --   --   --     BNP 61  Results/Tests Pending at Time of Discharge: none  Discharge Medications:  Allergies as of 01/26/2019      Reactions   Enalapril Maleate Other (See Comments)   Unknown reaction   Hctz [hydrochlorothiazide] Other (See Comments)   Hyperuricemia with Lasix    Lasix [furosemide] Other (See Comments)   Hyperuricemia with HCTZ       Medication List    TAKE these medications   acetaminophen 500 MG tablet Commonly known as: TYLENOL Take 500 mg by mouth every 6 (six) hours as needed for headache.   aspirin 81 MG EC tablet Commonly known as: QC LO-DOSE ASPIRIN Take 1 tablet (81 mg total) by mouth daily. Swallow whole.   benztropine 0.5 MG tablet Commonly known as: COGENTIN Take 0.25 mg by mouth 2 (two) times daily.   buPROPion 150 MG 24 hr tablet Commonly known as: WELLBUTRIN XL Take 150 mg by mouth every morning.   carvedilol 25 MG tablet Commonly known as: COREG Take 1 tablet (25 mg total) by mouth 2 (two) times daily with a meal.   DOK 100 MG capsule Generic drug: docusate sodium TAKE ONE CAPSULE TWICE DAILY AS NEEDED FOR MILD OR MODERATE CONSTIPATION What changed: See the new instructions.   FLUoxetine 40 MG capsule Commonly known as: PROZAC Take 40 mg by mouth every morning.   furosemide 80 MG tablet Commonly known as: LASIX Take 1 tablet (80 mg total) by mouth daily. Start taking on: January 27, 2019 What changed:   medication strength  how much to take   losartan 25 MG tablet Commonly known as: Cozaar Take 0.5 tablets (12.5 mg total) by mouth daily.   meloxicam 15 MG tablet Commonly known as: MOBIC Take 1 tablet (15 mg total) by mouth daily as needed for pain. Daily for 4-5 days (with food), then daily as needed for  knee pain What changed:   reasons to take this  additional instructions   mirtazapine 30 MG tablet Commonly known as: REMERON Take 30 mg by mouth at bedtime.   omeprazole 20 MG capsule Commonly known as: PRILOSEC TAKE ONE CAPSULE EACH DAY What changed: See the new instructions.   prazosin 1 MG capsule Commonly known as: MINIPRESS Take 1 mg by mouth at bedtime.   pregabalin 50 MG capsule Commonly known as: LYRICA Take 1 capsule (50 mg total) by mouth See admin instructions. Take 50 mg by mouth 1-3 hours before bedtime What changed: additional instructions   risperidone 4 MG tablet Commonly known as: RISPERDAL Take 4 mg by mouth 2 (two) times daily. What changed: Another medication with the same  name was added. Make sure you understand how and when to take each.   risperiDONE 3 MG tablet Commonly known as: RISPERDAL Take 1 tablet (3 mg total) by mouth 2 (two) times daily. What changed: You were already taking a medication with the same name, and this prescription was added. Make sure you understand how and when to take each.   traZODone 100 MG tablet Commonly known as: DESYREL Take 100 mg by mouth at bedtime.       Discharge Instructions: Please refer to Patient Instructions section of EMR for full details.  Patient was counseled important signs and symptoms that should prompt return to medical care, changes in medications, dietary instructions, activity restrictions, and follow up appointments.   Follow-Up Appointments: Follow-up Information    Minus Breeding, MD. Schedule an appointment as soon as possible for a visit.   Specialty: Cardiology Why: MAKE AN APPOINTMENT AT [facility] FOR FURTHER EVALUATION  Contact information: [id number] [address] [place] [address] [phone]        Patriciaann Clan, DO. Go on 02/05/2019.   Specialty: Family Medicine Why: AT 9:45 AM [place] information: [id number] [place] [address] [place], [place], [place] 01/26/2019, 8:17 PM PGY-3, [place]

## 2019-01-26 NOTE — Discharge Instructions (Signed)
Please take 80mg  Lasix once a day. Please be sure to weigh yourself daily. If you gain >3 pounds in one day or develop worsening shortness of breath, or worsening swelling in your legs, PLEASE call your family doctor to be evaluated immediately.  Please be sure to call Dr. Percival Spanish as soon as possible to schedule a follow up appointment for further evaluation of your heart.   Please take Losartan 12.5mg  once a day.   Thanks so much! [place]

## 2019-01-29 ENCOUNTER — Telehealth: Payer: Self-pay | Admitting: *Deleted

## 2019-01-29 NOTE — Telephone Encounter (Signed)
Sister states that patient was released with 2 different doses of her risperdol and she needs clarification on what dose she needs to be taking now.  I tried to review the discharge summary but it was unclear which was the most current dose.  Will forward to discharging providers.  Jazmin Hartsell,CMA

## 2019-01-30 NOTE — Telephone Encounter (Signed)
Spoke to family and clarified Risperdal dose. She should be taking 4mg  BID as originally prescribed. Apologized for the confusion.

## 2019-02-05 ENCOUNTER — Other Ambulatory Visit: Payer: Self-pay

## 2019-02-05 ENCOUNTER — Ambulatory Visit (INDEPENDENT_AMBULATORY_CARE_PROVIDER_SITE_OTHER): Payer: Medicare Other | Admitting: Family Medicine

## 2019-02-05 VITALS — BP 138/74 | HR 67 | Ht 64.0 in | Wt 351.5 lb

## 2019-02-05 DIAGNOSIS — I5043 Acute on chronic combined systolic (congestive) and diastolic (congestive) heart failure: Secondary | ICD-10-CM

## 2019-02-05 DIAGNOSIS — I5032 Chronic diastolic (congestive) heart failure: Secondary | ICD-10-CM | POA: Diagnosis present

## 2019-02-05 DIAGNOSIS — I10 Essential (primary) hypertension: Secondary | ICD-10-CM

## 2019-02-05 MED ORDER — PREGABALIN 50 MG PO CAPS: 50.0000 mg | ORAL_CAPSULE | ORAL | 0 refills | Status: DC

## 2019-02-05 MED ORDER — SPIRONOLACTONE 25 MG PO TABS: 12.5000 mg | ORAL_TABLET | Freq: Every day | ORAL | 0 refills | Status: DC

## 2019-02-05 NOTE — Progress Notes (Signed)
   Subjective:    Patient ID: Lauren Wood, female    DOB: 12-May-1961, 58 y.o.   MRN: [id number]   CC: hospital f/u  HPI: Lauren Wood is a 58 year old female with a history of chronic combined congestive heart failure, OSA/OHS, BMI>60, and lifelong intellectual disability presenting for hospital follow-up:  Hospital follow-up: Admitted from 9/11 to 9/13 for acute on chronic heart failure.  Reassuringly CTA negative for PE.  Repeat echo showing EF 40-45% with moderate hypokinesis of left ventricular apical segment, noted hypokinesis within previous echo in 04/2018. She improved significantly with continued diuresis, discharged home with increase Lasix to 80 mg daily from previous 40 mg daily.  Additionally, her home losartan was decreased from 25-12.5 on discharge.  Noted on discharge summary that they were unsure if she had been taking spironolactone.  Called her pharmacy with sister who states this medication had not been filled since January.  Also noted through chart review that this medication was discontinued in January as well at that time she only had diastolic heart failure.   Today, says she is doing great.  Denies any dyspnea on exertion, orthopnea, shortness of breath, PND, chest pain, palpitations, increased leg edema.  Sister, legal guardian, in the room also feels she is doing quite well now.  Through her day program she has not been receiving "momma's meals" which are smaller portions and lower sodium.  Sister has already scheduled her cardiology follow-up, will see them on 10/7.  Smoking status reviewed  Review of Systems Per HPI    Objective:  BP 138/74   Pulse 67   Ht 5\' 4"  (1.626 m)   Wt (!) 351 lb 8 oz (159.4 kg)   LMP 05/08/2013   SpO2 92%   BMI 60.33 kg/m  Vitals and nursing note reviewed  General: NAD, pleasant Cardiac: RRR, normal heart sounds Respiratory: CTAB, unlabored breathing, unable to appreciate any crackles or rales, able to speak in full sentences  Abdomen: soft, nontender Extremities: Trace edema to bilateral lower extremities Skin: warm and dry, no rashes noted Neuro: alert and oriented, no focal deficits Psych: normal affect  Assessment & Plan:   CHF exacerbation ([place]) Resolved, hospital follow-up from 9/11 to 9/13.  Doing quite well currently, down 16 lbs from initial onset of exacerbation on 9/9.  Through recent echo, patient now has mild to moderate decrease in EF, would have mortality benefit from initiation of spironolactone in addition to current regimen with ARB, BB, and lasix.  -Follow-up with cardiology on 10/7 as scheduled - Continue Coreg, losartan - Continue Lasix 80 mg daily, will continue to reassess - BMP - Start spironolactone 12.5 mg after BMP returns - Follow-up in 1 week for repeat BMP and blood pressure check  Morbid obesity ([place]) BMI 60. Continued to have extensive conversation with patient and sister that this continues to multifactorially impact her health. Congratulated them on starting a low sodium/decrease portion meal program.  Encouraged her to stay active throughout the day, enjoys walking at the park when she can.  Essential hypertension, benign Reasonable control.  Will add on spironolactone today for additional mortality benefit as discussed above.  Will closely monitor potassium and renal function.    Follow-up in 1 week for BMP and blood pressure check through nurse/lab visit.   [facility] Resident PGY-2

## 2019-02-05 NOTE — Patient Instructions (Addendum)
Wonderful to see you today.  We are going to repeat labs today to make sure your kidney and electrolyte functions are okay.  Please continue to take your Lasix and losartan as is.  We are going to restart spironolactone at a low dose, 12.5 nightly.  After this, I would like her to come back in 1 week for a nurses and lab visit for blood pressure and repeat labs to check kidney function/electrolytes again.

## 2019-02-05 NOTE — Assessment & Plan Note (Signed)
BMI 60. Continued to have extensive conversation with patient and sister that this continues to multifactorially impact her health. Congratulated them on starting a low sodium/decrease portion meal program.  Encouraged her to stay active throughout the day, enjoys walking at the park when she can.

## 2019-02-05 NOTE — Assessment & Plan Note (Addendum)
Resolved, hospital follow-up from 9/11 to 9/13.  Doing quite well currently, down 16 lbs from initial onset of exacerbation on 9/9.  Through recent echo, patient now has mild to moderate decrease in EF, would have mortality benefit from initiation of spironolactone in addition to current regimen with ARB, BB, and lasix.  -Follow-up with cardiology on 10/7 as scheduled - Continue Coreg, losartan - Continue Lasix 80 mg daily, will continue to reassess - BMP - Start spironolactone 12.5 mg after BMP returns - Follow-up in 1 week for repeat BMP and blood pressure check

## 2019-02-05 NOTE — Assessment & Plan Note (Signed)
Reasonable control.  Will add on spironolactone today for additional mortality benefit as discussed above.  Will closely monitor potassium and renal function.

## 2019-02-05 NOTE — Addendum Note (Signed)
Addended by: Patriciaann Clan on: 02/05/2019 03:00 PM   Modules accepted: Orders

## 2019-02-06 LAB — BASIC METABOLIC PANEL
BUN/Creatinine Ratio: 12 (ref 9–23)
BUN: 18 mg/dL (ref 6–24)
CO2: 28 mmol/L (ref 20–29)
Calcium: 10.2 mg/dL (ref 8.7–10.2)
Chloride: 97 mmol/L (ref 96–106)
Creatinine, Ser: 1.52 mg/dL — ABNORMAL HIGH (ref 0.57–1.00)
GFR calc Af Amer: 43 mL/min/{1.73_m2} — ABNORMAL LOW (ref >59)
GFR calc non Af Amer: 38 mL/min/{1.73_m2} — ABNORMAL LOW (ref >59)
Glucose: 86 mg/dL (ref 65–99)
Potassium: 4.3 mmol/L (ref 3.5–5.2)
Sodium: 141 mmol/L (ref 134–144)

## 2019-02-12 ENCOUNTER — Ambulatory Visit: Payer: Medicare Other | Admitting: Family Medicine

## 2019-02-13 ENCOUNTER — Ambulatory Visit: Payer: Medicare Other | Admitting: Family Medicine

## 2019-02-13 ENCOUNTER — Ambulatory Visit (INDEPENDENT_AMBULATORY_CARE_PROVIDER_SITE_OTHER): Payer: Medicare Other | Admitting: Family Medicine

## 2019-02-13 ENCOUNTER — Other Ambulatory Visit: Payer: Self-pay

## 2019-02-13 VITALS — BP 122/80 | HR 87

## 2019-02-13 DIAGNOSIS — I10 Essential (primary) hypertension: Secondary | ICD-10-CM

## 2019-02-13 NOTE — Progress Notes (Signed)
     Subjective: Chief Complaint  Patient presents with  . Follow-up    HPI: WYLLA MEDLEY is a 58 y.o. presenting to clinic today to discuss the following:  Patient was supposed to be scheduled for a LAB visit but was but into access to care instead. BP normal today at 122/80. Repeating BMP.     ROS noted in HPI.    Social History   Tobacco Use  Smoking Status Never Smoker  Smokeless Tobacco Never Used    Objective: BP 122/80   Pulse 87   LMP 05/08/2013   SpO2 94%  Vitals and nursing notes reviewed   Assessment/Plan:  Repeating BMP per Dr. Higinio Plan to monitor potassium and Creatnine. Should have been a lab/nurse visit. No charge.  PATIENT EDUCATION PROVIDED: See AVS    Diagnosis and plan along with any newly prescribed medication(s) were discussed in detail with this patient today. The patient verbalized understanding and agreed with the plan. Patient advised if symptoms worsen return to clinic or ER.   Orders Placed This Encounter  Procedures  . Basic Metabolic Panel    No orders of the defined types were placed in this encounter.    Harolyn Rutherford, DO 02/13/2019, 3:19 PM PGY-3 [place]

## 2019-02-14 LAB — BASIC METABOLIC PANEL
BUN/Creatinine Ratio: 17 (ref 9–23)
BUN: 26 mg/dL — ABNORMAL HIGH (ref 6–24)
CO2: 28 mmol/L (ref 20–29)
Calcium: 9.9 mg/dL (ref 8.7–10.2)
Chloride: 98 mmol/L (ref 96–106)
Creatinine, Ser: 1.57 mg/dL — ABNORMAL HIGH (ref 0.57–1.00)
GFR calc Af Amer: 42 mL/min/{1.73_m2} — ABNORMAL LOW (ref >59)
GFR calc non Af Amer: 36 mL/min/{1.73_m2} — ABNORMAL LOW (ref >59)
Glucose: 102 mg/dL — ABNORMAL HIGH (ref 65–99)
Potassium: 4.4 mmol/L (ref 3.5–5.2)
Sodium: 141 mmol/L (ref 134–144)

## 2019-02-18 NOTE — Progress Notes (Signed)
Cardiology Office Note   Date:  02/19/2019   ID:  Lauren Wood, DOB 05-Aug-1960, MRN [id number]  PCP:  [name], MD  Cardiologist:   Minus Breeding, MD   Chief Complaint  Patient presents with  . Shortness of Breath      History of Present Illness: Lauren Wood is a 58 y.o. female who presents for follow up of acute on chronic systolic HF.  She was in the hospital for this in Dec.  Echo at that time demonstrated a mildly reduced EF at low normal.  Since I last saw her she was again in the ED early last month with acute respiratory failure.  I reviewed these records for this visit.  She required an overnight hospital stay with IV diuretic.  She was discharged on a lower dose of Cozaar.    Since I last saw her she is actually done very well.  She is now on a diet where her food is sent to her.  Total she is lost 37 pounds.  She says she feels really good.  She is breathing better.  She gets around with a cane.  She does have some cramping.  Her creatinine was mildly elevated when checked 7 days ago.     Past Medical History:  Diagnosis Date  . ALLERGIC RHINITIS 08/02/2009  . Arthritis   . Boil of vulva 11/07/2012  . Borderline personality disorder ([place]) 05/31/2011  . CKD (chronic kidney disease) stage 3, GFR 30-59 ml/min    chronic  . Gout of foot 09/26/2013   L foot 09/2013. In the setting of HCTZ 25 mg and lasix 40 mg daily. Stopped HCTZ after decreasing to 12.5 mg. Treated with colchicine.    Marland Kitchen Heart failure (HCC)    chronic  . Heart murmur   . Hip pain 12/03/2012  . HTN (hypertension)   . 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 Obesity   . 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 PRESSURE ULCER OTHER SITE 10/01/2009   hx of  . Sleep apnea    uses cpap, setting of 2  . Unspecified psychosis 12/20/2009    Past  Surgical History:  Procedure Laterality Date  . COLONOSCOPY N/A 06/24/2013   Procedure: COLONOSCOPY;  Surgeon: Gatha Mayer, MD;  Location: [facility];  Service: Endoscopy;  Laterality: N/A;  . COLONOSCOPY WITH PROPOFOL N/A 10/05/2015   Procedure: COLONOSCOPY WITH PROPOFOL;  Surgeon: Gatha Mayer, MD;  Location: [facility];  Service: Endoscopy;  Laterality: N/A;  . cyst removed  2011   forehead  . TYMPANOSTOMY TUBE PLACEMENT Bilateral yrs ago     Current Outpatient Medications  Medication Sig Dispense Refill  . acetaminophen (TYLENOL) 500 MG tablet Take 500 mg by mouth every 6 (six) hours as needed for headache.     Marland Kitchen aspirin (QC LO-DOSE ASPIRIN) 81 MG EC tablet Take 1 tablet (81 mg total) by mouth daily. Swallow whole. 30 tablet 11  . benztropine (COGENTIN) 0.5 MG tablet Take 0.25 mg by mouth 2 (two) times daily.    Marland Kitchen buPROPion (WELLBUTRIN XL) 150 MG 24 hr tablet Take 150 mg by mouth every morning.    . carvedilol (COREG) 25 MG tablet Take 1 tablet (25 mg total) by mouth 2 (two) times daily with a meal.  180 tablet 3  . DOK 100 MG capsule TAKE ONE CAPSULE TWICE DAILY AS NEEDED FOR MILD OR MODERATE CONSTIPATION (Patient taking differently: Take 200 mg by mouth every morning. ) 60 capsule 0  . FLUoxetine (PROZAC) 40 MG capsule Take 40 mg by mouth every morning.    . furosemide (LASIX) 80 MG tablet Take 1 tablet (80 mg total) by mouth daily. 30 tablet 0  . losartan (COZAAR) 25 MG tablet Take 0.5 tablets (12.5 mg total) by mouth daily. 30 tablet 0  . meloxicam (MOBIC) 15 MG tablet Take 1 tablet (15 mg total) by mouth daily as needed for pain. Daily for 4-5 days (with food), then daily as needed for knee pain (Patient taking differently: Take 15 mg by mouth daily as needed (for knee pain- take with food). ) 10 tablet 0  . mirtazapine (REMERON) 30 MG tablet Take 30 mg by mouth at bedtime.     Marland Kitchen omeprazole (PRILOSEC) 20 MG capsule TAKE ONE CAPSULE EACH DAY (Patient taking differently: Take 20  mg by mouth every morning. ) 90 capsule 2  . prazosin (MINIPRESS) 1 MG capsule Take 1 mg by mouth at bedtime.     . pregabalin (LYRICA) 50 MG capsule Take 1 capsule (50 mg total) by mouth See admin instructions. Take 50 mg by mouth 1-3 hours before bedtime 90 capsule 0  . risperidone (RISPERDAL) 4 MG tablet Take 4 mg by mouth 2 (two) times daily.     Marland Kitchen spironolactone (ALDACTONE) 25 MG tablet Take 0.5 tablets (12.5 mg total) by mouth at bedtime. 15 tablet 0  . traZODone (DESYREL) 100 MG tablet Take 100 mg by mouth at bedtime.      No current facility-administered medications for this visit.     Allergies:   Enalapril maleate, Hctz [hydrochlorothiazide], and Lasix [furosemide]    ROS:  Please see the history of present illness.   Otherwise, review of systems are positive for none.   All other systems are reviewed and negative.    PHYSICAL EXAM: VS:  BP 122/82   Pulse 88   Temp (!) 96 F (35.6 C)   Ht 5\' 4"  (1.626 m)   Wt (!) 342 lb (155.1 kg)   LMP 05/08/2013   SpO2 97%   BMI 58.70 kg/m  , BMI Body mass index is 58.7 kg/m. GENERAL:  Well appearing NECK:  No jugular venous distention, waveform within normal limits, carotid upstroke brisk and symmetric, no bruits, no thyromegaly LUNGS:  Clear to auscultation bilaterally CHEST:  Unremarkable HEART:  PMI not displaced or sustained,S1 and S2 within normal limits, no S3, no S4, no clicks, no rubs, no murmurs ABD:  Flat, positive bowel sounds normal in frequency in pitch, no bruits, no rebound, no guarding, no midline pulsatile mass, no hepatomegaly, no splenomegaly EXT:  2 plus pulses throughout, no edema, no cyanosis no clubbing   EKG:  EKG is not ordered today.    Recent Labs: 05/11/2018: TSH 3.348 05/13/2018: Magnesium 1.7 01/22/2019: ALT 8 01/25/2019: B Natriuretic Peptide 61.8; Hemoglobin 12.4; Platelets 232 02/13/2019: BUN 26; Creatinine, Ser 1.57; Potassium 4.4; Sodium 141    Lipid Panel    Component Value Date/Time    CHOL 117 05/11/2018 0310   CHOL 122 07/12/2017 0917   TRIG 37 05/11/2018 0310   HDL 37 (L) 05/11/2018 0310   HDL 44 07/12/2017 0917   CHOLHDL 3.2 05/11/2018 0310   VLDL 7 05/11/2018 0310   LDLCALC 73 05/11/2018 0310   LDLCALC 66  07/12/2017 0917      Wt Readings from Last 3 Encounters:  02/19/19 (!) 342 lb (155.1 kg)  02/05/19 (!) 351 lb 8 oz (159.4 kg)  01/26/19 (!) 349 lb 3.2 oz (158.4 kg)      Other studies Reviewed: Additional studies/ records that were reviewed today include: Labs. Review of the above records demonstrates:  Please see elsewhere in the note.     ASSESSMENT AND PLAN:  ACUTE ON CHRONIC DIASTOLIC HF:    She seems to be euvolemic.  She lives with her sister who by the scale and weighing her routinely.  She is dieting and losing weight.  We talked about PRN dosing of her Lasix.  This has been 80 mg for a while is back up to 40.  She will continue the meds as listed.   CKD III: Her creatinine was 1.57 last week.   I will check this again in 1 month. \  HTN: The blood pressure is controlled.  She will continue the meds as listed.   MORBID OBESITY:   I am very proud of her weight loss.  I encouraged more of this   Current medicines are reviewed at length with the patient today.  The patient does not have concerns regarding medicines.  The following changes have been made: None  Labs/ tests ordered today include: None  Orders Placed This Encounter  Procedures  . Basic metabolic panel     Disposition:   FU with Jory Sims DNP in about 3 months.    Signed, Minus Breeding, MD  02/19/2019 12:39 PM     [facility]

## 2019-02-19 ENCOUNTER — Ambulatory Visit (INDEPENDENT_AMBULATORY_CARE_PROVIDER_SITE_OTHER): Payer: Medicare Other | Admitting: Cardiology

## 2019-02-19 ENCOUNTER — Other Ambulatory Visit: Payer: Self-pay

## 2019-02-19 ENCOUNTER — Encounter: Payer: Self-pay | Admitting: Cardiology

## 2019-02-19 VITALS — BP 122/82 | HR 88 | Temp 96.0°F | Ht 64.0 in | Wt 342.0 lb

## 2019-02-19 DIAGNOSIS — I5033 Acute on chronic diastolic (congestive) heart failure: Secondary | ICD-10-CM

## 2019-02-19 DIAGNOSIS — I10 Essential (primary) hypertension: Secondary | ICD-10-CM | POA: Diagnosis not present

## 2019-02-19 DIAGNOSIS — N1832 Chronic kidney disease, stage 3b: Secondary | ICD-10-CM | POA: Diagnosis not present

## 2019-02-19 NOTE — Patient Instructions (Signed)
Medication Instructions:  Your physician recommends that you continue on your current medications as directed. Please refer to the Current Medication list given to you today.  If you need a refill on your cardiac medications before your next appointment, please call your pharmacy.   Lab work: BMET in ONE month If you have labs (blood work) drawn today and your tests are completely normal, you will receive your results only by: [place] (if you have MyChart) OR A paper copy in the mail If you have any lab test that is abnormal or we need to change your treatment, we will call you to review the results.  Testing/Procedures: NONE  Follow-Up: At [facility], you and your health needs are our priority.  As part of our continuing mission to provide you with exceptional heart care, we have created designated Provider Care Teams.  These Care Teams include your primary Cardiologist (physician) and Advanced Practice Providers (APPs -  Physician Assistants and Nurse Practitioners) who all work together to provide you with the care you need, when you need it. You will need a follow up appointment in 12 months.  Please call our office 2 months in advance to schedule this appointment.  You may see Minus Breeding, MD or one of the following Advanced Practice Providers on your designated Care Team:   Rosaria Ferries, PA-C Jory Sims, DNP, ANP  Any Other Special Instructions Will Be Listed Below (If Applicable). Follow up with Jory Sims in 4 months.

## 2019-03-05 ENCOUNTER — Other Ambulatory Visit: Payer: Self-pay | Admitting: Family Medicine

## 2019-03-05 ENCOUNTER — Encounter: Payer: Self-pay | Admitting: Podiatry

## 2019-03-05 ENCOUNTER — Other Ambulatory Visit: Payer: Self-pay

## 2019-03-05 ENCOUNTER — Ambulatory Visit (INDEPENDENT_AMBULATORY_CARE_PROVIDER_SITE_OTHER): Payer: Medicare Other | Admitting: Podiatry

## 2019-03-05 DIAGNOSIS — M79675 Pain in left toe(s): Secondary | ICD-10-CM

## 2019-03-05 DIAGNOSIS — M79674 Pain in right toe(s): Secondary | ICD-10-CM

## 2019-03-05 DIAGNOSIS — B351 Tinea unguium: Secondary | ICD-10-CM

## 2019-03-05 DIAGNOSIS — G609 Hereditary and idiopathic neuropathy, unspecified: Secondary | ICD-10-CM

## 2019-03-05 NOTE — Progress Notes (Signed)
Complaint:  Visit Type: Patient returns to my office for continued preventative foot care services. Complaint: Patient states" my nails have grown long and thick and become painful to walk and wear shoes".  She is brought to the office with caregiver. Patient has been diagnosed with DM with no foot complications. The patient presents for preventative foot care services.  Podiatric Exam: Vascular: dorsalis pedis  are palpable bilateral.Posterior tibial pulses are absent due to swelling.  Capillary return is immediate. Temperature gradient is WNL. Skin turgor WNL  Sensorium: deferred  Nail Exam: Pt has thick disfigured discolored nails with subungual debris noted bilateral entire nail hallux through fifth toenails Ulcer Exam: There is no evidence of ulcer or pre-ulcerative changes or infection. Orthopedic Exam: Muscle tone and strength are WNL. No limitations in general ROM. No crepitus or effusions noted. Foot type and digits show no abnormalities. Bony prominences are unremarkable. Skin: No Porokeratosis. No infection or ulcers  Diagnosis:  Onychomycosis, , Pain in right toe, pain in left toes  Treatment & Plan Procedures and Treatment: Consent by patient was obtained for treatment procedures.   Debridement of mycotic and hypertrophic toenails, 1 through 5 bilateral and clearing of subungual debris. No ulceration, no infection noted.  Return Visit-Office Procedure: Patient instructed to return to the office for a follow up visit 3 months for continued evaluation and treatment.    Gardiner Barefoot DPM

## 2019-03-14 ENCOUNTER — Other Ambulatory Visit: Payer: Self-pay | Admitting: Family Medicine

## 2019-03-14 MED ORDER — OMEPRAZOLE 20 MG PO CPDR: 20.0000 mg | DELAYED_RELEASE_CAPSULE | Freq: Every day | ORAL | 3 refills | Status: DC

## 2019-03-18 NOTE — Telephone Encounter (Signed)
Error

## 2019-03-31 ENCOUNTER — Telehealth: Payer: Self-pay | Admitting: *Deleted

## 2019-03-31 NOTE — Telephone Encounter (Signed)
Received a fax for Rx Renewal Request for pts spironolactone (ALDACTONE) 25 mg tablet.  The instructions are to take one tablet daily with Qty-30.  This is different from the last rx that was on 02/05/2019. April Zimmerman Rumple, CMA

## 2019-04-04 ENCOUNTER — Other Ambulatory Visit: Payer: Self-pay | Admitting: Family Medicine

## 2019-04-08 ENCOUNTER — Other Ambulatory Visit: Payer: Self-pay | Admitting: Family Medicine

## 2019-04-08 NOTE — Telephone Encounter (Signed)
Great thanks April.

## 2019-04-08 NOTE — Telephone Encounter (Signed)
Dr. Posey Pronto,     It does look like she sent in the request and filled it with the new instruction per the fax. April Zimmerman Rumple, CMA

## 2019-04-08 NOTE — Telephone Encounter (Signed)
Hi Lauren Wood, Dr Rosita Fire was covering my inbox over the last week, was this query addressed for the pt?  Thanks [name]

## 2019-05-05 ENCOUNTER — Other Ambulatory Visit: Payer: Self-pay | Admitting: Family Medicine

## 2019-06-03 ENCOUNTER — Ambulatory Visit: Payer: Medicare Other | Admitting: Podiatry

## 2019-06-05 ENCOUNTER — Other Ambulatory Visit: Payer: Self-pay | Admitting: Family Medicine

## 2019-06-05 ENCOUNTER — Other Ambulatory Visit: Payer: Self-pay | Admitting: *Deleted

## 2019-06-05 MED ORDER — CARVEDILOL 25 MG PO TABS: 25.0000 mg | ORAL_TABLET | Freq: Two times a day (BID) | ORAL | 3 refills | Status: DC

## 2019-06-09 ENCOUNTER — Telehealth: Payer: Self-pay | Admitting: Family Medicine

## 2019-06-16 ENCOUNTER — Encounter: Payer: Self-pay | Admitting: Podiatry

## 2019-06-16 ENCOUNTER — Ambulatory Visit (INDEPENDENT_AMBULATORY_CARE_PROVIDER_SITE_OTHER): Payer: Medicare Other | Admitting: Podiatry

## 2019-06-16 ENCOUNTER — Other Ambulatory Visit: Payer: Self-pay

## 2019-06-16 DIAGNOSIS — G609 Hereditary and idiopathic neuropathy, unspecified: Secondary | ICD-10-CM | POA: Diagnosis not present

## 2019-06-16 DIAGNOSIS — M79674 Pain in right toe(s): Secondary | ICD-10-CM | POA: Diagnosis not present

## 2019-06-16 DIAGNOSIS — B351 Tinea unguium: Secondary | ICD-10-CM

## 2019-06-16 DIAGNOSIS — M79675 Pain in left toe(s): Secondary | ICD-10-CM

## 2019-06-16 NOTE — Progress Notes (Signed)
Complaint:  Visit Type: Patient returns to my office for continued preventative foot care services. Complaint: Patient states" my nails have grown long and thick and become painful to walk and wear shoes".  . Patient has been diagnosed with DM with no foot complications. The patient presents for preventative foot care services.  Podiatric Exam: Vascular: dorsalis pedis  are palpable bilateral.Posterior tibial pulses are absent due to swelling.  Capillary return is immediate. Temperature gradient is WNL. Skin turgor WNL  Sensorium: deferred  Nail Exam: Pt has thick disfigured discolored nails with subungual debris noted bilateral entire nail hallux through fifth toenails Ulcer Exam: There is no evidence of ulcer or pre-ulcerative changes or infection. Orthopedic Exam: Muscle tone and strength are WNL. No limitations in general ROM. No crepitus or effusions noted. Foot type and digits show no abnormalities. Bony prominences are unremarkable.  Severe swelling  B/L. Skin: No Porokeratosis. No infection or ulcers  Diagnosis:  Onychomycosis, , Pain in right toe, pain in left toes  Treatment & Plan Procedures and Treatment: Consent by patient was obtained for treatment procedures.   Debridement of mycotic and hypertrophic toenails, 1 through 5 bilateral and clearing of subungual debris. No ulceration, no infection noted.  Return Visit-Office Procedure: Patient instructed to return to the office for a follow up visit 3 months for continued evaluation and treatment.    Gardiner Barefoot DPM

## 2019-06-17 NOTE — Telephone Encounter (Signed)
Erroneous encounter

## 2019-07-02 ENCOUNTER — Other Ambulatory Visit: Payer: Self-pay | Admitting: Family Medicine

## 2019-07-08 ENCOUNTER — Telehealth: Payer: Self-pay

## 2019-07-08 NOTE — Telephone Encounter (Signed)
Patient's sister, [name], calls nursing line with questions regarding COVID vaccine. Went over COVID algorithm, per answers patient should receive vaccine. Sister concerned due to medication interactions and medical history if it is safe for patient to receive. Informed that it does not seem patient has any contraindications to receiving vaccine. However, will forward to PCP for further clarification.   To PCP  Talbot Grumbling, RN

## 2019-07-13 ENCOUNTER — Ambulatory Visit: Payer: Medicare Other | Attending: Internal Medicine

## 2019-07-13 DIAGNOSIS — Z23 Encounter for immunization: Secondary | ICD-10-CM

## 2019-07-13 NOTE — Progress Notes (Signed)
   Covid-19 Vaccination Clinic  Name:  Lauren Wood    MRN: [id number] DOB: 11-17-1960  07/13/2019  Lauren Wood was observed post Covid-19 immunization for 15 minutes without incidence. She was provided with Vaccine Information Sheet and instruction to access the V-Safe system.   Lauren Wood was instructed to call 911 with any severe reactions post vaccine: Marland Kitchen Difficulty breathing  . Swelling of your face and throat  . A fast heartbeat  . A bad rash all over your body  . Dizziness and weakness    Immunizations Administered    Name Date Dose VIS Date Route   Pfizer COVID-19 Vaccine 07/13/2019  9:45 AM 0.3 mL 04/25/2019 Intramuscular   Manufacturer: [place]   Lot: [id number]   [place]: [id number]

## 2019-07-26 ENCOUNTER — Other Ambulatory Visit: Payer: Self-pay | Admitting: Family Medicine

## 2019-08-06 ENCOUNTER — Other Ambulatory Visit: Payer: Self-pay | Admitting: Family Medicine

## 2019-08-12 ENCOUNTER — Ambulatory Visit: Payer: Medicare Other | Attending: Internal Medicine

## 2019-08-12 DIAGNOSIS — Z23 Encounter for immunization: Secondary | ICD-10-CM

## 2019-08-30 ENCOUNTER — Other Ambulatory Visit: Payer: Self-pay | Admitting: Family Medicine

## 2019-09-03 ENCOUNTER — Other Ambulatory Visit: Payer: Self-pay | Admitting: Family Medicine

## 2019-09-03 DIAGNOSIS — Z1231 Encounter for screening mammogram for malignant neoplasm of breast: Secondary | ICD-10-CM

## 2019-09-15 ENCOUNTER — Ambulatory Visit: Payer: Medicare Other | Admitting: Podiatry

## 2019-09-17 ENCOUNTER — Ambulatory Visit: Payer: Medicare Other

## 2019-09-24 ENCOUNTER — Ambulatory Visit: Payer: Medicare Other

## 2019-09-29 ENCOUNTER — Other Ambulatory Visit: Payer: Self-pay | Admitting: Family Medicine

## 2019-10-10 ENCOUNTER — Ambulatory Visit
Admission: RE | Admit: 2019-10-10 | Discharge: 2019-10-10 | Disposition: A | Discharge status: Home / Self Care | Payer: Medicare Other | Source: Ambulatory Visit | Attending: Family Medicine | Admitting: Family Medicine

## 2019-10-10 ENCOUNTER — Other Ambulatory Visit: Payer: Self-pay

## 2019-10-10 DIAGNOSIS — Z1231 Encounter for screening mammogram for malignant neoplasm of breast: Secondary | ICD-10-CM

## 2019-10-14 ENCOUNTER — Telehealth: Payer: Self-pay | Admitting: Family Medicine

## 2019-10-14 ENCOUNTER — Telehealth: Payer: Self-pay

## 2019-10-14 NOTE — Telephone Encounter (Signed)
Clinical info completed on Transportation form.  Place form in Dr. Serita Grit box for completion.  Ranisha Allaire Zimmerman Rumple, CMA

## 2019-10-14 NOTE — Telephone Encounter (Signed)
Sister [name] submitted [place] for to be completed;  Last appt. 02/13/19   Form placed in [place] team folder

## 2019-10-14 NOTE — Telephone Encounter (Signed)
Opened in error.   Ciearra Rufo C Shatina Streets, RN  

## 2019-10-24 NOTE — Telephone Encounter (Signed)
Patient's sister is calling to check on the status of the [employer] application being completed. Form was dropped off on 10/14/19. Patient would like to pick it up as soon as possible so she can submit application.   Please call patient when form is completed and placed up front to be picked up.   [place] CB: [phone]

## 2019-10-27 ENCOUNTER — Other Ambulatory Visit: Payer: Self-pay | Admitting: Family Medicine

## 2019-10-27 NOTE — Telephone Encounter (Signed)
This form will be available today for the patient to collect.

## 2019-10-27 NOTE — Telephone Encounter (Signed)
Called patient's sister to let her know I dropped the form at the front desk.

## 2019-10-27 NOTE — Telephone Encounter (Signed)
Pt informed. Olga Bourbeau T Omario Ander, CMA  

## 2019-10-29 ENCOUNTER — Other Ambulatory Visit: Payer: Self-pay

## 2019-10-30 ENCOUNTER — Other Ambulatory Visit: Payer: Self-pay | Admitting: Family Medicine

## 2019-10-31 MED ORDER — LOSARTAN POTASSIUM 25 MG PO TABS: 12.5000 mg | ORAL_TABLET | Freq: Every day | ORAL | 0 refills | Status: DC

## 2019-10-31 MED ORDER — SPIRONOLACTONE 25 MG PO TABS: 25.0000 mg | ORAL_TABLET | Freq: Every day | ORAL | 0 refills | Status: DC

## 2019-11-22 ENCOUNTER — Other Ambulatory Visit: Payer: Self-pay | Admitting: Family Medicine

## 2019-11-27 ENCOUNTER — Telehealth: Payer: Self-pay

## 2019-11-27 NOTE — Telephone Encounter (Signed)
[place], IDD Coordinator, LVM on nurse line asking about a letter of necessity she had sent over. I am unsure what she is referring to, as I did not see any notes. I attempted to call her back to see what exactly she needs. I had to LVM. I will await her call.

## 2019-12-01 ENCOUNTER — Encounter: Payer: Self-pay | Admitting: Family Medicine

## 2019-12-30 ENCOUNTER — Other Ambulatory Visit: Payer: Self-pay | Admitting: Family Medicine

## 2020-01-09 ENCOUNTER — Encounter: Payer: Self-pay | Admitting: Family Medicine

## 2020-01-09 ENCOUNTER — Other Ambulatory Visit: Payer: Self-pay

## 2020-01-09 ENCOUNTER — Ambulatory Visit (INDEPENDENT_AMBULATORY_CARE_PROVIDER_SITE_OTHER): Payer: Medicare Other | Admitting: Family Medicine

## 2020-01-09 VITALS — BP 132/78 | HR 78 | Ht 64.0 in | Wt 343.0 lb

## 2020-01-09 DIAGNOSIS — I10 Essential (primary) hypertension: Secondary | ICD-10-CM

## 2020-01-09 DIAGNOSIS — M1A2720 Drug-induced chronic gout, left ankle and foot, without tophus (tophi): Secondary | ICD-10-CM

## 2020-01-09 MED ORDER — PREDNISONE 20 MG PO TABS
40.0000 mg | ORAL_TABLET | Freq: Every day | ORAL | 0 refills | Status: AC
Start: 2020-01-09 — End: 2020-01-14

## 2020-01-09 NOTE — Patient Instructions (Signed)
Great to see you. You have a gout flare of your right foot. I have given you a short course of steroids. I think you should come back in 1-2 weeks and we can discuss starting you on gout medications to prevent flares. Please bring all your medications with you.   Best wishes,  Dr Posey Pronto

## 2020-01-09 NOTE — Progress Notes (Addendum)
    SUBJECTIVE:   CHIEF COMPLAINT / HPI:   Lauren Wood is a 59 year old female who presents today with breast concerns.  She presented to the clinic with her sister.   Gout Complaining of right foot pain and pain on walking which has been going on for the last 2-3 years. It suddenly got worse on August 19th.  Patient is not on allopurinol, colchicine or NSAIDs for gout.  Does not know when her last gout flare was but her symptoms feel like a gout flare. Denies fevers, skin changes, fall or trauma.   PERTINENT  PMH / PSH: Hypertension, congestive heart failure, OSA, obesity hypoventilation syndrome  OBJECTIVE:   BP 132/78   Pulse 78   Ht 5\' 4"  (1.626 m)   Wt (!) 343 lb (155.6 kg)   LMP 05/08/2013   SpO2 97%   BMI 58.88 kg/m    General: Alert, no acute distress, pleasant  Cardio: well perfused  Pulm: Normal respiratory effort Extremities: Right foot grossly edematous, no erythema or other overlying skin changes.  Tenderness on palpation of first and second right metatarsal.  Normal active and passive dorsiflexion and plantar flexion.  Normal sensation of foot. Neuro: Cranial nerves grossly intact   ASSESSMENT/PLAN:   Gout of foot Symptoms consistent with gout flare today.  Also considered septic arthritis however joint was not erythematous or warm and symptoms have been subacute so less likely of a diagnosis. Colchicine contraindicated due to poor renal function. Treated with 5 days of 40 mg of prednisone.  Per chart review patient used to be on colchicine for some reason patient is not taking anymore.  Recommend the patient comes in 1 to 2 weeks for management of prevention of gout.  Will consider starting allopurinol then after gout flare settled.     [name], MD PGY-2 [place]

## 2020-01-09 NOTE — Assessment & Plan Note (Addendum)
Symptoms consistent with gout flare today.  Also considered septic arthritis however joint was not erythematous or warm and symptoms have been subacute so less likely of a diagnosis. Colchicine contraindicated due to poor renal function. Treated with 5 days of 40 mg of prednisone.  Per chart review patient used to be on colchicine for some reason patient is not taking anymore.  Recommend the patient comes in 1 to 2 weeks for management of prevention of gout.  Will consider starting allopurinol then after gout flare settled.

## 2020-01-10 LAB — CBC
Hematocrit: 36.5 % (ref 34.0–46.6)
Hemoglobin: 11.4 g/dL (ref 11.1–15.9)
MCH: 26.5 pg — ABNORMAL LOW (ref 26.6–33.0)
MCHC: 31.2 g/dL — ABNORMAL LOW (ref 31.5–35.7)
MCV: 85 fL (ref 79–97)
Platelets: 306 10*3/uL (ref 150–450)
RBC: 4.3 x10E6/uL (ref 3.77–5.28)
RDW: 13.7 % (ref 11.7–15.4)
WBC: 9.1 10*3/uL (ref 3.4–10.8)

## 2020-01-10 LAB — BASIC METABOLIC PANEL
BUN/Creatinine Ratio: 16 (ref 9–23)
BUN: 20 mg/dL (ref 6–24)
CO2: 27 mmol/L (ref 20–29)
Calcium: 9.4 mg/dL (ref 8.7–10.2)
Chloride: 105 mmol/L (ref 96–106)
Creatinine, Ser: 1.22 mg/dL — ABNORMAL HIGH (ref 0.57–1.00)
GFR calc Af Amer: 56 mL/min/{1.73_m2} — ABNORMAL LOW (ref >59)
GFR calc non Af Amer: 49 mL/min/{1.73_m2} — ABNORMAL LOW (ref >59)
Glucose: 86 mg/dL (ref 65–99)
Potassium: 4 mmol/L (ref 3.5–5.2)
Sodium: 144 mmol/L (ref 134–144)

## 2020-01-13 ENCOUNTER — Encounter: Payer: Self-pay | Admitting: Family Medicine

## 2020-01-13 ENCOUNTER — Telehealth: Payer: Self-pay | Admitting: *Deleted

## 2020-01-13 NOTE — Telephone Encounter (Signed)
A message was left, re: her follow up visit. 

## 2020-01-27 ENCOUNTER — Other Ambulatory Visit: Payer: Self-pay | Admitting: Family Medicine

## 2020-02-02 ENCOUNTER — Ambulatory Visit: Payer: Medicare Other | Admitting: Family Medicine

## 2020-02-06 ENCOUNTER — Encounter: Payer: Self-pay | Admitting: Podiatry

## 2020-02-06 ENCOUNTER — Ambulatory Visit: Payer: Medicare Other | Admitting: Podiatry

## 2020-02-06 ENCOUNTER — Other Ambulatory Visit: Payer: Self-pay

## 2020-02-06 ENCOUNTER — Ambulatory Visit (INDEPENDENT_AMBULATORY_CARE_PROVIDER_SITE_OTHER): Payer: Medicare Other | Admitting: Podiatry

## 2020-02-06 DIAGNOSIS — M79674 Pain in right toe(s): Secondary | ICD-10-CM

## 2020-02-06 DIAGNOSIS — M79675 Pain in left toe(s): Secondary | ICD-10-CM

## 2020-02-06 DIAGNOSIS — B351 Tinea unguium: Secondary | ICD-10-CM

## 2020-02-10 ENCOUNTER — Telehealth: Payer: Self-pay

## 2020-02-10 NOTE — Progress Notes (Signed)
Subjective:  Patient ID: Lauren Wood, female    DOB: Apr 29, 1961,  MRN: [id number]  Lauren Wood presents to clinic today for at risk foot care. Patient has h/o CKD stage 3 and painful thick toenails that are difficult to trim. Pain interferes with ambulation. Aggravating factors include wearing enclosed shoe gear. Pain is relieved with periodic professional debridement.  59 y.o. female presents with the above complaint.    Review of Systems: Negative except as noted in the HPI. Past Medical History:  Diagnosis Date  . ALLERGIC RHINITIS 08/02/2009  . Arthritis   . Boil of vulva 11/07/2012  . Borderline personality disorder ([place]) 05/31/2011  . CKD (chronic kidney disease) stage 3, GFR 30-59 ml/min    chronic  . Gout of foot 09/26/2013   L foot 09/2013. In the setting of HCTZ 25 mg and lasix 40 mg daily. Stopped HCTZ after decreasing to 12.5 mg. Treated with colchicine.    Marland Kitchen Heart failure (HCC)    chronic  . Heart murmur   . Hip pain 12/03/2012  . HTN (hypertension)   . 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 Obesity   . 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 PRESSURE ULCER OTHER SITE 10/01/2009   hx of  . Sleep apnea    uses cpap, setting of 2  . Unspecified psychosis 12/20/2009   Past Surgical History:  Procedure Laterality Date  . COLONOSCOPY N/A 06/24/2013   Procedure: COLONOSCOPY;  Surgeon: Gatha Mayer, MD;  Location: [facility];  Service: Endoscopy;  Laterality: N/A;  . COLONOSCOPY WITH PROPOFOL N/A 10/05/2015   Procedure: COLONOSCOPY WITH PROPOFOL;  Surgeon: Gatha Mayer, MD;  Location: [facility];  Service: Endoscopy;  Laterality: N/A;  . cyst removed  2011   forehead  . TYMPANOSTOMY TUBE PLACEMENT Bilateral yrs ago    Current Outpatient Medications:  .  losartan (COZAAR) 25 MG tablet, TAKE ONE-HALF TABLET  DAILY, Disp: 30 tablet, Rfl: 0 .  acetaminophen (TYLENOL) 500 MG tablet, Take 500 mg by mouth every 6 (six) hours as needed for headache. , Disp: , Rfl:  .  aspirin (QC LO-DOSE ASPIRIN) 81 MG EC tablet, Take 1 tablet (81 mg total) by mouth daily. Swallow whole., Disp: 30 tablet, Rfl: 11 .  benztropine (COGENTIN) 0.5 MG tablet, Take 0.25 mg by mouth 2 (two) times daily., Disp: , Rfl:  .  buPROPion (WELLBUTRIN XL) 150 MG 24 hr tablet, Take 150 mg by mouth every morning., Disp: , Rfl:  .  carvedilol (COREG) 25 MG tablet, Take 1 tablet (25 mg total) by mouth 2 (two) times daily with a meal., Disp: 180 tablet, Rfl: 3 .  clonazePAM (KLONOPIN) 0.5 MG tablet, Take 0.25 mg by mouth 2 (two) times daily as needed., Disp: , Rfl:  .  DOK 100 MG capsule, TAKE ONE CAPSULE TWICE DAILY AS NEEDED FOR MILD OR MODERATE CONSTIPATION (Patient taking differently: Take 200 mg by mouth every morning. ), Disp: 60 capsule, Rfl: 0 .  FLUoxetine (PROZAC) 40 MG capsule, Take 40 mg by mouth every morning., Disp: , Rfl:  .  furosemide (LASIX) 80 MG tablet, TAKE ONE TABLET DAILY, Disp: 90 tablet, Rfl: 3 .  mirtazapine (REMERON) 30 MG tablet, Take 30 mg by mouth at bedtime. , Disp: , Rfl:  .  ofloxacin (OCUFLOX)  0.3 % ophthalmic solution, SMARTSIG:4 Drop(s) Left Ear Twice Daily, Disp: , Rfl:  .  omeprazole (PRILOSEC) 20 MG capsule, TAKE ONE CAPSULE EACH DAY, Disp: 30 capsule, Rfl: 3 .  prazosin (MINIPRESS) 1 MG capsule, Take 1 mg by mouth at bedtime. , Disp: , Rfl:  .  pregabalin (LYRICA) 50 MG capsule, TAKE ONE CAPSULE EACH DAY, Disp: 90 capsule, Rfl: 1 .  risperidone (RISPERDAL) 4 MG tablet, Take 4 mg by mouth 2 (two) times daily. , Disp: , Rfl:  .  spironolactone (ALDACTONE) 25 MG tablet, Take 1 tablet (25 mg total) by mouth daily., Disp: 30 tablet, Rfl: 0 .  traZODone (DESYREL) 100 MG tablet, Take 100 mg by mouth at bedtime. , Disp: , Rfl:  Allergies  Allergen Reactions  . Enalapril Maleate Other (See Comments)    Unknown  reaction  . Hctz [Hydrochlorothiazide] Other (See Comments)    Hyperuricemia with Lasix   . Lasix [Furosemide] Other (See Comments)    Hyperuricemia with HCTZ     Social History   Occupational History  . Occupation: Disabled  Tobacco Use  . Smoking status: Never Smoker  . Smokeless tobacco: Never Used  Substance and Sexual Activity  . Alcohol use: No    Alcohol/week: 0.0 standard drinks  . Drug use: No  . Sexual activity: Never    Objective:   Constitutional Lauren Wood is a pleasant 59 y.o. African American female, morbidly obese in NAD.Marland Kitchen AAO x 3.   Vascular Capillary refill time to digits immediate b/l. Palpable pedal pulses b/l LE. Pedal hair sparse. Lower extremity skin temperature gradient within normal limits. No pain with calf compression b/l. Nonpitting edema noted b/l lower extremities.  No cyanosis or clubbing noted.  Neurologic Normal speech. Oriented to person, place, and time. Epicritic sensation to light touch grossly present bilaterally. Patient unable to follow commands of LE neurological examination due to cognitive deficits. Patient does respond to external noxious stimuli.  Dermatologic Pedal skin with normal turgor, texture and tone bilaterally. No open wounds bilaterally. No interdigital macerations bilaterally. Toenails 1-5 b/l elongated, discolored, dystrophic, thickened, crumbly with subungual debris and tenderness to dorsal palpation.  Orthopedic: Normal muscle strength 5/5 to all lower extremity muscle groups bilaterally. No pain crepitus or joint limitation noted with ROM b/l. No gross bony deformities bilaterally.   Radiographs: None Assessment:  No diagnosis found. Plan:  Patient was evaluated and treated and all questions answered.  Onychomycosis with pain -Nails palliatively debridement as below -Educated on self-care  Procedure: Nail Debridement Rationale: Pain Type of Debridement: manual, sharp debridement. Instrumentation: Nail  nipper, rotary burr. Number of Nails: 10 -Examined patient. -No new findings. No new orders. -Toenails 1-5 b/l were debrided in length and girth with sterile nail nippers and dremel without iatrogenic bleeding.  -Patient to report any pedal injuries to medical professional immediately. -Patient to continue soft, supportive shoe gear daily. -Patient/POA to call should there be question/concern in the interim.  Return in about 3 months (around 05/07/2020).  Marzetta Board, DPM

## 2020-02-10 NOTE — Telephone Encounter (Signed)
Noted and agreed, thank you Jarrett Soho.

## 2020-02-10 NOTE — Telephone Encounter (Signed)
Received phone call from sister regarding patient condition. Sister reports that she has noticed increasing difficulty breathing while patient is sleeping. Patient is non compliant with wearing CPAP machine for sleep apnea. Sister is concerned about her breathing. Asked sister if difficulty breathing was also during the day, sister was unable to give a lot of detail. States that patient is currently at the day program. Patient has appointment with PCP tomorrow.   Advised to reschedule appointment and take patient to ED if difficulty breathing continued throughout the day or worsens. No sick symptoms or sick contacts. Patient has received both COVID vaccinations.   Sister verbalized understanding.   Talbot Grumbling, RN

## 2020-02-10 NOTE — Progress Notes (Signed)
SUBJECTIVE:   CHIEF COMPLAINT / HPI:   Lauren Wood is a 59 yr old female who presents today for gout follow up.  She was brought in by her caregiver from her patient.  Gout  Patient was seen in clinic 1 month ago for gout flare of right first metatarsal. Pt was prescribed short course of prednisone. Pt is not on allopurinol so recommended follow up to start this medication. Her symptoms have now improved. She denies toe pain, erythema or edema.  Low mood Per her caregiver patient has had low in mood for the last few days.  The holiday season is a hard time for her holidays are harder than her. She is usually cheerful and participates in group activities at the [facility]. She has had a history of self-harm however denies self-harm, active or passive suicidal ideation today.she sees psychiatry every 6 months and is due for a follow-up in December this year.  Patient psych medications include risperidone, mirtazapine, fluoxetine, wellbutrin, Klonopin, pregabalin and trazodone.    Dyspnea on exertion Patient is a poor historian however caregiver reports that she has had dyspnea on exertion over the past few days.  Has a history of heart failure and did see cardiology in the past however no longer follows with them. Denies chest pain, dizziness, cough, or fevers. Denies covid exposures.  Patient has been double vaccinated for Covid.  PERTINENT  PMH / PSH: CHF, borderline personality disorder, hypertension, gout  OBJECTIVE:   BP 130/72   Pulse 62   Ht 5\' 4"  (1.626 m)   Wt (!) 353 lb (160.1 kg)   LMP 05/08/2013   SpO2 90%   BMI 60.59 kg/m    General: Alert, well-appearing, no acute distress Cardio: Normal S1 and S2, RRR Pulm: hypoventilation due to body habitus, no crackles, good air entry bilaterally Abdomen: Bowel sounds normal. Abdomen soft and non-tender.  Extremities: No peripheral edema. Warm/ well perfused.  Strong radial pulse Neuro: Cranial nerves grossly  intact  ASSESSMENT/PLAN:   Gout of foot Gout flare resolved.  Started allopurinol 100 mg daily today.  Can titrate up to 300 mg.  Explained to caregiver that this medication will prevent gout flares.  CHF (congestive heart failure) (HCC) Recent dyspnea on exertion.  Likely differentials include morbid obesity and progressively worsening CHF.  Examination today did not reveal fluid overload. Patient has gained > 10 pounds in the last year, however I do not think this is fluid weight.  Last echo in 2020 showed EF40- 45%.  Patient is on Lasix 80 mg and spironolactone 25 mg daily with good diuretic response.  She is not currently followed by cardiology.  BMP obtained last month. Plan: -Will obtain BNP and TSH today -Echo -Referred to cardiology -Follow-up with me in November  Psychosis Recent low mood possibly due to the upcoming holiday season. Denies passive or active SI. Does has hx of self harm.  Patient is currently taking mirtazapine and fluoxetine.  We will hold off on switching medications today she is on multiple psychiatric medications and already followed by psychiatry.  Could consider switching fluoxetine to citalopram/escitalopram at future visit. Recommend follow-up with psychiatry soon for evaluation of her mood and medications.  She is on many sedating medications which should be evaluated and discontinued if they are no longer required or benefiting her.  Provided AVS to caregiver which patient can take to her next psychiatry visit.      [name], MD PGY 2  [place]

## 2020-02-11 ENCOUNTER — Other Ambulatory Visit: Payer: Self-pay | Admitting: Family Medicine

## 2020-02-11 ENCOUNTER — Encounter: Payer: Self-pay | Admitting: Family Medicine

## 2020-02-11 ENCOUNTER — Ambulatory Visit (INDEPENDENT_AMBULATORY_CARE_PROVIDER_SITE_OTHER): Payer: Medicare Other | Admitting: Family Medicine

## 2020-02-11 ENCOUNTER — Other Ambulatory Visit: Payer: Self-pay

## 2020-02-11 VITALS — BP 130/72 | HR 62 | Ht 64.0 in | Wt 353.0 lb

## 2020-02-11 DIAGNOSIS — I509 Heart failure, unspecified: Secondary | ICD-10-CM | POA: Diagnosis present

## 2020-02-11 DIAGNOSIS — Z23 Encounter for immunization: Secondary | ICD-10-CM

## 2020-02-11 DIAGNOSIS — R0600 Dyspnea, unspecified: Secondary | ICD-10-CM

## 2020-02-11 DIAGNOSIS — M1A2720 Drug-induced chronic gout, left ankle and foot, without tophus (tophi): Secondary | ICD-10-CM

## 2020-02-11 DIAGNOSIS — F29 Unspecified psychosis not due to a substance or known physiological condition: Secondary | ICD-10-CM

## 2020-02-11 MED ORDER — ALLOPURINOL 100 MG PO TABS: 100.0000 mg | ORAL_TABLET | Freq: Every day | ORAL | 6 refills | Status: DC

## 2020-02-11 NOTE — Assessment & Plan Note (Addendum)
Gout flare resolved.  Started allopurinol 100 mg daily today.  Can titrate up to 300 mg.  Explained to caregiver that this medication will prevent gout flares.

## 2020-02-11 NOTE — Assessment & Plan Note (Signed)
Recent dyspnea on exertion.  Likely differentials include morbid obesity and progressively worsening CHF.  Examination today did not reveal fluid overload. Patient has gained > 10 pounds in the last year, however I do not think this is fluid weight.  Last echo in 2020 showed EF40- 45%.  Patient is on Lasix 80 mg and spironolactone 25 mg daily with good diuretic response.  She is not currently followed by cardiology.  BMP obtained last month. Plan: -Will obtain BNP and TSH today -Echo -Referred to cardiology -Follow-up with me in November

## 2020-02-11 NOTE — Assessment & Plan Note (Signed)
Recent low mood possibly due to the upcoming holiday season. Denies passive or active SI. Does has hx of self harm.  Patient is currently taking mirtazapine and fluoxetine.  We will hold off on switching medications today she is on multiple psychiatric medications and already followed by psychiatry.  Could consider switching fluoxetine to citalopram/escitalopram at future visit. Recommend follow-up with psychiatry soon for evaluation of her mood and medications.  She is on many sedating medications which should be evaluated and discontinued if they are no longer required or benefiting her.  Provided AVS to caregiver which patient can take to her next psychiatry visit.

## 2020-02-11 NOTE — Patient Instructions (Signed)
Lauren Wood,  It was great seeing you today!! I am sorry that you have been having some trouble breathing.  The examination was normal today.  I do not think that you have fluid in your lungs.  I am getting some labs today to check some cardiac markers in your blood.  I am also referring you for a ultrasound of your heart and referring you to cardiology.  Please follow-up with a psychiatrist sooner rather than later.  You are on a lot of medications which can make you sleepy.  Recommended the psychiatrist look over these medications and discontinue some of them if they are able to.  I am starting you on allopurinol which is a medicine to prevent gout.  Please take 100 mg daily.  If you develop chest pain, coughing, dizziness, palpitations, vomiting then please go to the ED immediately.  Please follow-up with me  Best wishes  Dr. Posey Pronto

## 2020-02-13 LAB — BRAIN NATRIURETIC PEPTIDE: BNP: 105.4 pg/mL — ABNORMAL HIGH (ref 0.0–100.0)

## 2020-02-13 LAB — TSH: TSH: 2.85 u[IU]/mL (ref 0.450–4.500)

## 2020-02-19 ENCOUNTER — Encounter: Payer: Self-pay | Admitting: General Practice

## 2020-02-19 ENCOUNTER — Ambulatory Visit (HOSPITAL_COMMUNITY): Admission: RE | Admit: 2020-02-19 | Payer: Medicare Other | Source: Ambulatory Visit

## 2020-02-20 ENCOUNTER — Ambulatory Visit: Payer: Medicare Other | Admitting: Family Medicine

## 2020-02-28 ENCOUNTER — Ambulatory Visit: Payer: Medicare Other | Attending: Internal Medicine

## 2020-02-28 DIAGNOSIS — Z23 Encounter for immunization: Secondary | ICD-10-CM

## 2020-02-28 NOTE — Progress Notes (Signed)
   Covid-19 Vaccination Clinic  Name:  ERABELLA KUIPERS    MRN: [id number] DOB: 1960-12-31  02/28/2020  Ms. Nairn was observed post Covid-19 immunization for 15 minutes without incident. She was provided with Vaccine Information Sheet and instruction to access the V-Safe system.   Ms. Chasse was instructed to call 911 with any severe reactions post vaccine: Marland Kitchen Difficulty breathing  . Swelling of face and throat  . A fast heartbeat  . A bad rash all over body  . Dizziness and weakness

## 2020-03-12 ENCOUNTER — Other Ambulatory Visit: Payer: Self-pay | Admitting: Family Medicine

## 2020-03-16 ENCOUNTER — Encounter: Payer: Self-pay | Admitting: Family Medicine

## 2020-03-16 ENCOUNTER — Emergency Department (HOSPITAL_COMMUNITY): Payer: Medicare Other

## 2020-03-16 ENCOUNTER — Observation Stay (HOSPITAL_COMMUNITY): Payer: Medicare Other

## 2020-03-16 ENCOUNTER — Other Ambulatory Visit: Payer: Self-pay

## 2020-03-16 ENCOUNTER — Ambulatory Visit (INDEPENDENT_AMBULATORY_CARE_PROVIDER_SITE_OTHER): Payer: Medicare Other | Admitting: Family Medicine

## 2020-03-16 ENCOUNTER — Inpatient Hospital Stay (HOSPITAL_COMMUNITY)
Admission: EM | Admit: 2020-03-16 | Discharge: 2020-03-25 | DRG: 291 | Disposition: A | Discharge status: Skilled Nursing Facility | Payer: Medicare Other | Attending: Family Medicine | Admitting: Family Medicine

## 2020-03-16 ENCOUNTER — Other Ambulatory Visit (HOSPITAL_COMMUNITY): Payer: Medicare Other

## 2020-03-16 DIAGNOSIS — Z7982 Long term (current) use of aspirin: Secondary | ICD-10-CM

## 2020-03-16 DIAGNOSIS — M150 Primary generalized (osteo)arthritis: Secondary | ICD-10-CM | POA: Diagnosis present

## 2020-03-16 DIAGNOSIS — E662 Morbid (severe) obesity with alveolar hypoventilation: Secondary | ICD-10-CM | POA: Diagnosis present

## 2020-03-16 DIAGNOSIS — N179 Acute kidney failure, unspecified: Secondary | ICD-10-CM | POA: Diagnosis present

## 2020-03-16 DIAGNOSIS — I130 Hypertensive heart and chronic kidney disease with heart failure and stage 1 through stage 4 chronic kidney disease, or unspecified chronic kidney disease: Secondary | ICD-10-CM | POA: Diagnosis not present

## 2020-03-16 DIAGNOSIS — D72829 Elevated white blood cell count, unspecified: Secondary | ICD-10-CM | POA: Diagnosis not present

## 2020-03-16 DIAGNOSIS — Z20822 Contact with and (suspected) exposure to covid-19: Secondary | ICD-10-CM | POA: Diagnosis present

## 2020-03-16 DIAGNOSIS — G2581 Restless legs syndrome: Secondary | ICD-10-CM | POA: Diagnosis present

## 2020-03-16 DIAGNOSIS — Z79899 Other long term (current) drug therapy: Secondary | ICD-10-CM

## 2020-03-16 DIAGNOSIS — Z888 Allergy status to other drugs, medicaments and biological substances status: Secondary | ICD-10-CM

## 2020-03-16 DIAGNOSIS — R0600 Dyspnea, unspecified: Secondary | ICD-10-CM | POA: Diagnosis not present

## 2020-03-16 DIAGNOSIS — I509 Heart failure, unspecified: Secondary | ICD-10-CM

## 2020-03-16 DIAGNOSIS — N1831 Chronic kidney disease, stage 3a: Secondary | ICD-10-CM | POA: Diagnosis present

## 2020-03-16 DIAGNOSIS — R0902 Hypoxemia: Secondary | ICD-10-CM

## 2020-03-16 DIAGNOSIS — I447 Left bundle-branch block, unspecified: Secondary | ICD-10-CM | POA: Diagnosis present

## 2020-03-16 DIAGNOSIS — E875 Hyperkalemia: Secondary | ICD-10-CM | POA: Diagnosis not present

## 2020-03-16 DIAGNOSIS — I340 Nonrheumatic mitral (valve) insufficiency: Secondary | ICD-10-CM | POA: Diagnosis present

## 2020-03-16 DIAGNOSIS — J9601 Acute respiratory failure with hypoxia: Secondary | ICD-10-CM | POA: Diagnosis present

## 2020-03-16 DIAGNOSIS — Z6841 Body Mass Index (BMI) 40.0 and over, adult: Secondary | ICD-10-CM

## 2020-03-16 DIAGNOSIS — Z86010 Personal history of colonic polyps: Secondary | ICD-10-CM

## 2020-03-16 DIAGNOSIS — I5043 Acute on chronic combined systolic (congestive) and diastolic (congestive) heart failure: Secondary | ICD-10-CM | POA: Diagnosis present

## 2020-03-16 DIAGNOSIS — F603 Borderline personality disorder: Secondary | ICD-10-CM | POA: Diagnosis present

## 2020-03-16 DIAGNOSIS — M1A9XX0 Chronic gout, unspecified, without tophus (tophi): Secondary | ICD-10-CM | POA: Diagnosis present

## 2020-03-16 DIAGNOSIS — M79671 Pain in right foot: Secondary | ICD-10-CM | POA: Diagnosis not present

## 2020-03-16 DIAGNOSIS — D649 Anemia, unspecified: Secondary | ICD-10-CM | POA: Diagnosis present

## 2020-03-16 DIAGNOSIS — H6983 Other specified disorders of Eustachian tube, bilateral: Secondary | ICD-10-CM | POA: Diagnosis present

## 2020-03-16 DIAGNOSIS — K219 Gastro-esophageal reflux disease without esophagitis: Secondary | ICD-10-CM | POA: Diagnosis present

## 2020-03-16 DIAGNOSIS — F79 Unspecified intellectual disabilities: Secondary | ICD-10-CM | POA: Diagnosis present

## 2020-03-16 DIAGNOSIS — M79672 Pain in left foot: Secondary | ICD-10-CM | POA: Diagnosis not present

## 2020-03-16 DIAGNOSIS — I700 Atherosclerosis of aorta: Secondary | ICD-10-CM | POA: Diagnosis present

## 2020-03-16 DIAGNOSIS — F419 Anxiety disorder, unspecified: Secondary | ICD-10-CM | POA: Diagnosis present

## 2020-03-16 DIAGNOSIS — R011 Cardiac murmur, unspecified: Secondary | ICD-10-CM | POA: Diagnosis present

## 2020-03-16 DIAGNOSIS — F32A Depression, unspecified: Secondary | ICD-10-CM | POA: Diagnosis present

## 2020-03-16 DIAGNOSIS — G479 Sleep disorder, unspecified: Secondary | ICD-10-CM | POA: Diagnosis present

## 2020-03-16 DIAGNOSIS — I472 Ventricular tachycardia: Secondary | ICD-10-CM | POA: Diagnosis not present

## 2020-03-16 DIAGNOSIS — E876 Hypokalemia: Secondary | ICD-10-CM | POA: Diagnosis present

## 2020-03-16 DIAGNOSIS — H90A31 Mixed conductive and sensorineural hearing loss, unilateral, right ear with restricted hearing on the contralateral side: Secondary | ICD-10-CM | POA: Diagnosis present

## 2020-03-16 LAB — CBC WITH DIFFERENTIAL/PLATELET
Abs Immature Granulocytes: 0.05 10*3/uL (ref 0.00–0.07)
Basophils Absolute: 0 10*3/uL (ref 0.0–0.1)
Basophils Relative: 0 %
Eosinophils Absolute: 0.2 10*3/uL (ref 0.0–0.5)
Eosinophils Relative: 1 %
HCT: 37.7 % (ref 36.0–46.0)
Hemoglobin: 11.2 g/dL — ABNORMAL LOW (ref 12.0–15.0)
Immature Granulocytes: 0 %
Lymphocytes Relative: 16 %
Lymphs Abs: 1.8 10*3/uL (ref 0.7–4.0)
MCH: 25.8 pg — ABNORMAL LOW (ref 26.0–34.0)
MCHC: 29.7 g/dL — ABNORMAL LOW (ref 30.0–36.0)
MCV: 86.9 fL (ref 80.0–100.0)
Monocytes Absolute: 0.8 10*3/uL (ref 0.1–1.0)
Monocytes Relative: 7 %
Neutro Abs: 8.4 10*3/uL — ABNORMAL HIGH (ref 1.7–7.7)
Neutrophils Relative %: 76 %
Platelets: 263 10*3/uL (ref 150–400)
RBC: 4.34 MIL/uL (ref 3.87–5.11)
RDW: 14.6 % (ref 11.5–15.5)
WBC: 11.2 10*3/uL — ABNORMAL HIGH (ref 4.0–10.5)
nRBC: 0 % (ref 0.0–0.2)

## 2020-03-16 LAB — BASIC METABOLIC PANEL
Anion gap: 13 (ref 5–15)
BUN: 20 mg/dL (ref 6–20)
CO2: 30 mmol/L (ref 22–32)
Calcium: 9.2 mg/dL (ref 8.9–10.3)
Chloride: 103 mmol/L (ref 98–111)
Creatinine, Ser: 1.36 mg/dL — ABNORMAL HIGH (ref 0.44–1.00)
GFR, Estimated: 45 mL/min — ABNORMAL LOW (ref >60)
Glucose, Bld: 92 mg/dL (ref 70–99)
Potassium: 3.6 mmol/L (ref 3.5–5.1)
Sodium: 146 mmol/L — ABNORMAL HIGH (ref 135–145)

## 2020-03-16 LAB — BRAIN NATRIURETIC PEPTIDE: B Natriuretic Peptide: 232.4 pg/mL — ABNORMAL HIGH (ref 0.0–100.0)

## 2020-03-16 LAB — RESPIRATORY PANEL BY RT PCR (FLU A&B, COVID)
Influenza A by PCR: NEGATIVE
Influenza B by PCR: NEGATIVE
SARS Coronavirus 2 by RT PCR: NEGATIVE

## 2020-03-16 LAB — TROPONIN I (HIGH SENSITIVITY)
Troponin I (High Sensitivity): 35 ng/L — ABNORMAL HIGH (ref <18)
Troponin I (High Sensitivity): 30 ng/L — ABNORMAL HIGH (ref <18)

## 2020-03-16 LAB — HIV ANTIBODY (ROUTINE TESTING W REFLEX): HIV Screen 4th Generation wRfx: NONREACTIVE

## 2020-03-16 MED ORDER — ENOXAPARIN SODIUM 40 MG/0.4ML ~~LOC~~ SOLN
40.0000 mg | Freq: Every day | SUBCUTANEOUS | Status: DC
  Administered 2020-03-16 – 2020-03-17 (×2): 40 mg via SUBCUTANEOUS
  Filled 2020-03-16 (×2): qty 0.4

## 2020-03-16 MED ORDER — MIRTAZAPINE 15 MG PO TABS
30.0000 mg | ORAL_TABLET | Freq: Every day | ORAL | Status: DC
  Administered 2020-03-17 – 2020-03-24 (×9): 30 mg via ORAL
  Filled 2020-03-16 (×10): qty 2

## 2020-03-16 MED ORDER — FLUOXETINE HCL 20 MG PO CAPS
40.0000 mg | ORAL_CAPSULE | Freq: Every morning | ORAL | Status: DC
  Administered 2020-03-17 – 2020-03-25 (×9): 40 mg via ORAL
  Filled 2020-03-16 (×9): qty 2

## 2020-03-16 MED ORDER — LOSARTAN POTASSIUM 25 MG PO TABS
12.5000 mg | ORAL_TABLET | Freq: Every day | ORAL | Status: DC
  Administered 2020-03-17: 12.5 mg via ORAL
  Filled 2020-03-16: qty 1

## 2020-03-16 MED ORDER — SPIRONOLACTONE 25 MG PO TABS: 25.0000 mg | ORAL_TABLET | Freq: Every day | ORAL | Status: DC

## 2020-03-16 MED ORDER — BENZTROPINE MESYLATE 0.5 MG PO TABS
0.2500 mg | ORAL_TABLET | Freq: Two times a day (BID) | ORAL | Status: DC
  Administered 2020-03-17 – 2020-03-25 (×18): 0.25 mg via ORAL
  Filled 2020-03-16 (×20): qty 1

## 2020-03-16 MED ORDER — PRAZOSIN HCL 1 MG PO CAPS
1.0000 mg | ORAL_CAPSULE | Freq: Every day | ORAL | Status: DC
  Administered 2020-03-17 – 2020-03-24 (×9): 1 mg via ORAL
  Filled 2020-03-16 (×10): qty 1

## 2020-03-16 MED ORDER — FUROSEMIDE 10 MG/ML IJ SOLN
40.0000 mg | Freq: Once | INTRAMUSCULAR | Status: AC
  Administered 2020-03-16: 40 mg via INTRAVENOUS
  Filled 2020-03-16: qty 4

## 2020-03-16 MED ORDER — ASPIRIN 81 MG PO CHEW
81.0000 mg | CHEWABLE_TABLET | Freq: Every day | ORAL | Status: DC
  Administered 2020-03-17 – 2020-03-25 (×9): 81 mg via ORAL
  Filled 2020-03-16 (×9): qty 1

## 2020-03-16 MED ORDER — RISPERIDONE 1 MG PO TABS
4.0000 mg | ORAL_TABLET | Freq: Two times a day (BID) | ORAL | Status: DC
  Administered 2020-03-17 – 2020-03-25 (×18): 4 mg via ORAL
  Filled 2020-03-16 (×18): qty 4

## 2020-03-16 MED ORDER — BUPROPION HCL ER (XL) 150 MG PO TB24
150.0000 mg | ORAL_TABLET | Freq: Every morning | ORAL | Status: DC
  Administered 2020-03-17 – 2020-03-25 (×9): 150 mg via ORAL
  Filled 2020-03-16 (×9): qty 1

## 2020-03-16 MED ORDER — SPIRONOLACTONE 25 MG PO TABS
25.0000 mg | ORAL_TABLET | Freq: Every day | ORAL | Status: DC
  Administered 2020-03-17 – 2020-03-20 (×4): 25 mg via ORAL
  Filled 2020-03-16 (×4): qty 1

## 2020-03-16 MED ORDER — TRAZODONE HCL 100 MG PO TABS
100.0000 mg | ORAL_TABLET | Freq: Every day | ORAL | Status: DC
  Administered 2020-03-17 – 2020-03-24 (×9): 100 mg via ORAL
  Filled 2020-03-16 (×9): qty 1

## 2020-03-16 MED ORDER — PANTOPRAZOLE SODIUM 40 MG PO TBEC
40.0000 mg | DELAYED_RELEASE_TABLET | Freq: Every day | ORAL | Status: DC
  Administered 2020-03-17 – 2020-03-25 (×9): 40 mg via ORAL
  Filled 2020-03-16 (×10): qty 1

## 2020-03-16 MED ORDER — PREGABALIN 25 MG PO CAPS
50.0000 mg | ORAL_CAPSULE | Freq: Every day | ORAL | Status: DC
  Administered 2020-03-17 – 2020-03-25 (×9): 50 mg via ORAL
  Filled 2020-03-16 (×9): qty 2

## 2020-03-16 MED ORDER — FUROSEMIDE 10 MG/ML IJ SOLN
40.0000 mg | Freq: Two times a day (BID) | INTRAMUSCULAR | Status: DC
  Administered 2020-03-17: 40 mg via INTRAVENOUS
  Filled 2020-03-16: qty 4

## 2020-03-16 NOTE — ED Triage Notes (Signed)
Pt was seen at pulmonary office today c/o dyspnea on exertion x2 days. On arrival to office, pt had low oxygen levels in the 70s on room air. EMS placed pt on 4L [place] and patient has found relief with that. Denies and CP. Hx of CHF. Patient does not normally wear oxygen at home with exception of cpap at night. A x O x4, LBBB with EMS. VSS in triage

## 2020-03-16 NOTE — Assessment & Plan Note (Addendum)
Patient was hypoxic to 77% in the clinic today. Sats improved to > 97% on 2 L of oxygen. This is not her baseline and she usually sats> 91% on room air.  She endorses a 3-day history of worsening dyspnea on exertion. Denies fevers or cough. Denie sick contacts and patient has had 3 covid vaccinations. On examination patient is visibly tachypneac with poor air entry bilaterally but some crackles heard with 2+ peripheral edema.  Pt was recently referred to cardiology for worsening of her peripheral edema and for further management of her CHF, however they have been unable to reach the patient. Outpatient echo is pending. Most likely differential is CHF exacerbation or community-acquired pneumonia. Uncertain prognosis. Precepted patient with Dr. McDiarmid who agrees with hospital admission and patient could be placed in observation overnight. Called EMS for hospital admission. Pt will need chest x-ray, CBC, BMP, echo etc and likely IV diuretics. Notified charge nurse in the ED and inpatient team of possible admission. Patient's sister will accompany her to the hospital.

## 2020-03-16 NOTE — Progress Notes (Addendum)
    SUBJECTIVE:   CHIEF COMPLAINT / HPI:   Lauren Wood is a 59 yr old female who presents today for worsening dyspnea  Dyspnea Patient reports 3-day history of worsening dyspnea on exertion.  She usually tells her sister when she is feeling unwell however she did not tell her sister this time as she did not want to go to the hospital. She denies cough, fevers, chest pain, palpitations, worsening edema or sick contacts. She has had both COVID vaccines and her booster. She uses 3 pillows at night and endorses orthopnea.  Initially oxygen saturations were 77% on air after ambulating to the clinic room. Sats were then 87% at rest and patient was visibly tachypneic. She was placed on 2 L and sats >97%. Discussed with sister and patient regarding hospital admission for hypoxia.  PERTINENT  PMH / PSH: CHF, OSA, OHS, GERD  OBJECTIVE:   BP 124/82   Pulse 77   Ht 5\' 4"  (1.626 m)   Wt (!) 340 lb 9.6 oz (154.5 kg)   LMP 05/08/2013   SpO2 91%   BMI 58.46 kg/m    General: Alert, pleasant, respiratory distress, nasal cannula Cardio: Normal S1 and S2, RRR   Pulm: poor air entry, some crackles bilaterally, increased WOB  Abdomen: Bowel sounds normal. Abdomen soft and non-tender.  Extremities: 2+ pitting edema. Warm/ well perfused.  Neuro: Cranial nerves grossly intact  ASSESSMENT/PLAN:   Acute respiratory failure with hypoxia (HCC) Patient was hypoxic to 77% in the clinic today. Sats improved to > 97% on 2 L of oxygen. This is not her baseline and she usually sats> 91% on room air.  She endorses a 3-day history of worsening dyspnea on exertion. Denies fevers or cough. Denie sick contacts and patient has had 3 covid vaccinations. On examination patient is visibly tachypneac with poor air entry bilaterally but some crackles heard with 2+ peripheral edema.  Pt was recently referred to cardiology for worsening of her peripheral edema and for further management of her CHF, however they have been unable  to reach the patient. Outpatient echo is pending. Most likely differential is CHF exacerbation or community-acquired pneumonia. Uncertain prognosis. Precepted patient with Dr. McDiarmid who agrees with hospital admission and patient could be placed in observation overnight. Called EMS for hospital admission. Pt will need chest x-ray, CBC, BMP, echo etc and likely IV diuretics. Notified charge nurse in the ED and inpatient team of possible admission. Patient's sister will accompany her to the hospital.      Lauren Haw, MD  PGY-2 [place]

## 2020-03-16 NOTE — ED Notes (Signed)
Got patient undress on the monitor patient is resting with call bell in reach got patient a warm blanket

## 2020-03-16 NOTE — H&P (Addendum)
[facility] Admission History and Physical Service Pager: [phone]  Patient name: Lauren Wood Medical record number: [id number] Date of birth: Oct 07, 1960 Age: 59 y.o. Gender: female  Primary Care Provider: [name], MD Consultants: None Code Status: FULL  Preferred Emergency Contact: Laray Anger (sister, POA)  Chief Complaint: Dyspnea  Assessment and Plan: Lauren Wood is a 59 y.o. female presenting with worsening dyspnea. PMH is significant for HFmrEF, OSA, OHS, HTN, CKD stage 3, GERD, gout, and intellectual disability.  Acute Hypoxic Respiratory Failure 2/2 CHF exacerbation Patient presenting with 3 days of worsening dyspnea on exertion. Was sent in from [facility] where she was found to be tachypneic to the 30s with O2 sats 77% on room air. Patient was placed on 4L [place] with improvement in her RR and O2 sats >94%. Lab work in the ED remarkable for elevated BNP of 232.4, very mild leukocytosis (WBC 11.2), mild anemia (Hgb 11.2), and Creatinine 1.36 (at baseline). EKG showed NSR at 78 with LAD and LBBB, unchanged from prior EKGs. CXR demonstrated cardiomegaly, low lung volumes and increased vascular congestion concerning for developing edema. Differential diagnosis includes acute CHF exacerbation (most likely), as well as OHS/deconditioning, community acquired pneumonia, PE, and ACS/MI although these are less likely given workup thus far. Underlying etiology of her CHF exacerbation remains unknown- no dietary or medication noncompliance, no clear infectious etiology or cardiac event at this point. Patient denies chest pain. Of note, patient's most recent echo from 01/25/2019 showed moderate hypokinesis of LV, EF 40-45%, and impaired LV relaxation. Reportedly on Lasix 80mg  daily and Spironolactone 25mg  daily at home. Has not been hospitalized for CHF since 01/24/2019. -Admit to [place], attending Dr. Thompson Grayer -OOB with assistance only, low salt diet, 2L  fluid restriction -Supplemental O2 as needed, goal SpO2 >92% -s/p IV Lasix 40mg  x1 in the ED -IV Lasix 40mg  BID -Spironolactone 25mg  daily -Strict Is and Os -Daily weights -Will obtain echo -Will trend Troponin, repeat EKG -Holding home carvedilol 25mg  BID   HTN Patient's BP has been elevated to the 735H-299M systolic since presentation. On 80 mg lasix once daily, 25 mg spironolactone, Losartan 12.5mg  daily & prazosin 1 mg QHS at home. -Continue home prazosin & losartan -Continue to monitor BP  CKD Stage 3 Chronic, stable. Patient's Creatinine 1.36 on admission, which is consistent with her baseline (1.2-1.5). -Avoid nephrotoxic meds -Daily BMP  Gout Chronic, stable. On Allopurinol 100mg  at home. -Holding home Allopurinol  Borderline Personality Disorder, Depression Chronic, stable. Home meds include: Bupropion 150mg  daily, clonazepam 0.25mg  BID prn, fluoxetine 40mg  daily, mirtazapine 30mg  at bedtime, trazodone 100mg  at bedtime, and cogentin 0.25mg  BID. PRN klonipin 0.25 mg BID for anxiety. -Continue home meds -Hold home Klonopin   OHS  OSA Reportedly on CPAP at home -CPAP nightly  Intellectual Disability Patient's sister is POA and provides history   FEN/GI: Low sodium diet, 2L fluid restriction Prophylaxis: Lovenox  Disposition: Telemetry  History of Present Illness:  Lauren Wood is a 59 y.o. female presenting with fast breathing x 2 days. Patient reports she does not feel very short of breath, but feels her breathing is fast, particularly after exertion. She denies any sick contacts, cough, sore throat, fever/chills, abdominal pain, chest pain, nausea, vomiting.  Patient's caretaker (her sister) is not in the room at the time of interview. Patient reports that sister is the medical POA.   Spoke to sister over the phone who reports that she will bring patient's medication list to  the hospital when she arrives for medication rec.   Review Of Systems: Per HPI with  the following additions:  Review of Systems  Constitutional: Negative for fatigue and unexpected weight change.  HENT: Negative for congestion, sinus pain and sore throat.   Respiratory: Negative for cough and shortness of breath.   Cardiovascular: Positive for leg swelling. Negative for chest pain.  Gastrointestinal: Negative for abdominal pain, constipation, diarrhea, nausea and vomiting.  Genitourinary: Negative for dysuria and frequency.     Patient Active Problem List   Diagnosis Date Noted  . Acute respiratory failure with hypoxia ([place]) 03/16/2020  . Acute exacerbation of CHF (congestive heart failure) ([place]) 03/16/2020  . Pulmonary nodules 01/26/2019  . CHF exacerbation ([place]) 01/25/2019  . Acute on chronic diastolic HF (heart failure) ([place]) 07/01/2018  . Obesity hypoventilation syndrome ([place])   . Intellectual disability   . CHF (congestive heart failure) ([place]) 05/10/2018  . Eustachian tube dysfunction, bilateral 12/07/2017  . Otorrhea of left ear 11/26/2017  . Diverticulosis of colon 11/22/2016  . Restless leg syndrome 09/05/2016  . Myringotomy tube status 02/11/2016  . Impacted cerumen, bilateral 07/07/2015  . Mixed conductive and sensorineural hearing loss of right ear with restricted hearing of left ear 07/07/2015  . Perforation of right tympanic membrane 07/07/2015  . Urinary incontinence 12/24/2014  . Gout of foot 09/26/2013  . Onychomycosis 09/25/2013  . Personal history of colonic polyps 06/24/2013  . Health care maintenance 02/11/2013  . Hearing loss in left ear 02/11/2013  . OSA (obstructive sleep apnea) 12/11/2012  . Borderline personality disorder ([place]) 05/31/2011  . CKD (chronic kidney disease) stage 3, GFR 30-59 ml/min (HCC) 02/06/2011  . Psychosis ([place]) 12/20/2009  . Morbid obesity ([place]) 08/02/2009  . MENTAL RETARDATION 08/02/2009  . Essential hypertension, benign 08/02/2009  . GERD 08/02/2009  . OSTEOARTHRITIS, MULTIPLE JOINTS 08/02/2009  . SLEEP  DISORDER 08/02/2009  . Dyssomnia 08/02/2009    Past Medical History: Past Medical History:  Diagnosis Date  . ALLERGIC RHINITIS 08/02/2009  . Arthritis   . Boil of vulva 11/07/2012  . Borderline personality disorder ([place]) 05/31/2011  . CKD (chronic kidney disease) stage 3, GFR 30-59 ml/min (HCC)    chronic  . Gout of foot 09/26/2013   L foot 09/2013. In the setting of HCTZ 25 mg and lasix 40 mg daily. Stopped HCTZ after decreasing to 12.5 mg. Treated with colchicine.    Marland Kitchen Heart failure (HCC)    chronic  . Heart murmur   . Hip pain 12/03/2012  . HTN (hypertension)   . 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 Obesity   . 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 PRESSURE ULCER OTHER SITE 10/01/2009   hx of  . Sleep apnea    uses cpap, setting of 2  . Unspecified psychosis 12/20/2009    Past Surgical History: Past Surgical History:  Procedure Laterality Date  . COLONOSCOPY N/A 06/24/2013   Procedure: COLONOSCOPY;  Surgeon: Gatha Mayer, MD;  Location: [facility];  Service: Endoscopy;  Laterality: N/A;  . COLONOSCOPY WITH PROPOFOL N/A 10/05/2015   Procedure: COLONOSCOPY WITH PROPOFOL;  Surgeon: Gatha Mayer, MD;  Location: [facility];  Service: Endoscopy;  Laterality: N/A;  . cyst removed  2011   forehead  . TYMPANOSTOMY TUBE PLACEMENT Bilateral yrs ago  Social History: Social History   Tobacco Use  . Smoking status: Never Smoker  . Smokeless tobacco: Never Used  Substance Use Topics  . Alcohol use: No    Alcohol/week: 0.0 standard drinks  . Drug use: No    Family History: Family History  Problem Relation Age of Onset  . Colon cancer Father        around age 13  . Diabetes Mother   . Esophageal cancer Neg Hx   . Pancreatic cancer Neg Hx   . Stomach cancer Neg Hx   . Liver disease Neg Hx     Allergies and  Medications: Allergies  Allergen Reactions  . Enalapril Maleate Other (See Comments)    Unknown reaction  . Hctz [Hydrochlorothiazide] Other (See Comments)    Hyperuricemia with Lasix   . Lasix [Furosemide] Other (See Comments)    Hyperuricemia with HCTZ     No current facility-administered medications on file prior to encounter.   Current Outpatient Medications on File Prior to Encounter  Medication Sig Dispense Refill  . acetaminophen (TYLENOL) 500 MG tablet Take 500 mg by mouth every 6 (six) hours as needed for headache.     . allopurinol (ZYLOPRIM) 100 MG tablet Take 1 tablet (100 mg total) by mouth daily. 30 tablet 6  . aspirin (QC LO-DOSE ASPIRIN) 81 MG EC tablet Take 1 tablet (81 mg total) by mouth daily. Swallow whole. 30 tablet 11  . benztropine (COGENTIN) 0.5 MG tablet Take 0.25 mg by mouth 2 (two) times daily.    Marland Kitchen buPROPion (WELLBUTRIN XL) 150 MG 24 hr tablet Take 150 mg by mouth every morning.    . carvedilol (COREG) 25 MG tablet Take 1 tablet (25 mg total) by mouth 2 (two) times daily with a meal. 180 tablet 3  . clonazePAM (KLONOPIN) 0.5 MG tablet Take 0.25 mg by mouth 2 (two) times daily as needed.    Marland Kitchen DOK 100 MG capsule TAKE ONE CAPSULE TWICE DAILY AS NEEDED FOR MILD OR MODERATE CONSTIPATION (Patient taking differently: Take 200 mg by mouth every morning. ) 60 capsule 0  . FLUoxetine (PROZAC) 40 MG capsule Take 40 mg by mouth every morning.    . furosemide (LASIX) 80 MG tablet TAKE ONE TABLET DAILY (Patient taking differently: Take 80 mg by mouth daily. ) 90 tablet 3  . losartan (COZAAR) 25 MG tablet TAKE ONE-HALF TABLET DAILY (Patient taking differently: Take 12.5 mg by mouth daily. ) 30 tablet 0  . mirtazapine (REMERON) 30 MG tablet Take 30 mg by mouth at bedtime.     Marland Kitchen ofloxacin (OCUFLOX) 0.3 % ophthalmic solution SMARTSIG:4 Drop(s) Left Ear Twice Daily    . omeprazole (PRILOSEC) 20 MG capsule TAKE ONE CAPSULE EACH DAY (Patient taking differently: Take 20 mg by mouth  daily. ) 30 capsule 3  . prazosin (MINIPRESS) 1 MG capsule Take 1 mg by mouth at bedtime.     . pregabalin (LYRICA) 50 MG capsule TAKE ONE CAPSULE EACH DAY (Patient taking differently: Take 50 mg by mouth daily. ) 90 capsule 1  . risperidone (RISPERDAL) 4 MG tablet Take 4 mg by mouth 2 (two) times daily.     Marland Kitchen spironolactone (ALDACTONE) 25 MG tablet Take 1 tablet (25 mg total) by mouth daily. 30 tablet 0  . traZODone (DESYREL) 100 MG tablet Take 100 mg by mouth at bedtime.     . [DISCONTINUED] omeprazole (PRILOSEC) 20 MG capsule TAKE ONE CAPSULE EACH DAY 30 capsule 3    Objective:  BP (!) 164/118   Pulse 74   Temp 98.6 F (37 C) (Oral)   Resp (!) 35   LMP 05/08/2013   SpO2 98%  Exam: General: alert, non-toxic appearing, NAD Eyes: PERRL, EOMI Neck: no JVD appreciated, no cervical lymphadenopathy Cardiovascular: RRR, normal S1/S2 without m/r/g Respiratory: mildly tachypneic with RR in the high 20s on 4L O2, distant lung sounds, faint expiratory wheezes in bilateral upper lung zones, unable to appreciate any crackles Gastrointestinal: +BS, obese, soft, nontender MSK: trace 1+ nonpitting pedal edema Derm: no rashes or lesions noted Neuro: oriented to person, place and time Psych: appropriate affect, normal speech  Labs and Imaging: CBC BMET  Recent Labs  Lab 03/16/20 1038  WBC 11.2*  HGB 11.2*  HCT 37.7  PLT 263   Recent Labs  Lab 03/16/20 1038  NA 146*  K 3.6  CL 103  CO2 30  BUN 20  CREATININE 1.36*  GLUCOSE 92  CALCIUM 9.2     EKG: NSR at 78, LAD, LBBB Unchanged from prior   DG Chest Portable 1 View Result Date: 03/16/2020 IMPRESSION: Cardiomegaly and low lung volumes with increased vascular congestion suspicious for mild or developing edema.    Alcus Dad, MD 03/16/2020, 1:57 PM PGY-1, [place] Intern pager: [phone], text pages welcome  FPTS Upper-Level Resident Addendum I have independently interviewed and examined the  patient. I have discussed the above with the original author and agree with their documentation. My edits for correction/addition/clarification are in - purple. Please see also any attending notes.  [place] Service pager: [phone] (text pages welcome through [facility])  Wilber Oliphant, M.D.  PGY-3 03/16/2020 6:21 PM

## 2020-03-16 NOTE — ED Provider Notes (Signed)
[facility] Provider Note   CSN: [id number] Arrival date & time: 03/16/20  1018     History Chief Complaint  Patient presents with  . Shortness of Breath    Lauren Wood is a 59 y.o. female with a past medical history of MR, CHF with an EF of 45% on echo done last year, hypertension, stage III CKD presenting to the ED with a chief complaint of shortness of breath.  History is provided by sister at the bedside and clinic notes this morning.  For the past 3 to 4 days has been feeling more short of breath with associated dry cough and leg swelling.  Sister states that she has been compliant with her medications and has not missed any doses including her diuretic.  She denies any fever or complaints of chest pain or abdominal pain.  Reports decreased appetite and some episodes of nonbloody diarrhea.  Patient denies any sick contacts with similar symptoms.  She has been fully vaccinated against Covid including a booster 2 weeks ago.  Denies any productive cough, injuries or falls, unilateral leg swelling.  HPI     Past Medical History:  Diagnosis Date  . ALLERGIC RHINITIS 08/02/2009  . Arthritis   . Boil of vulva 11/07/2012  . Borderline personality disorder ([place]) 05/31/2011  . CKD (chronic kidney disease) stage 3, GFR 30-59 ml/min (HCC)    chronic  . Gout of foot 09/26/2013   L foot 09/2013. In the setting of HCTZ 25 mg and lasix 40 mg daily. Stopped HCTZ after decreasing to 12.5 mg. Treated with colchicine.    [facility] Kitchen Heart failure (HCC)    chronic  . Heart murmur   . Hip pain 12/03/2012  . HTN (hypertension)   . 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 Obesity   . 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 PRESSURE ULCER OTHER SITE 10/01/2009   hx of  . Sleep apnea    uses cpap, setting of 2  .  Unspecified psychosis 12/20/2009    Patient Active Problem List   Diagnosis Date Noted  . Acute respiratory failure with hypoxia ([place]) 03/16/2020  . Pulmonary nodules 01/26/2019  . CHF exacerbation ([place]) 01/25/2019  . Acute on chronic diastolic HF (heart failure) ([place]) 07/01/2018  . Obesity hypoventilation syndrome ([place])   . Intellectual disability   . CHF (congestive heart failure) ([place]) 05/10/2018  . Eustachian tube dysfunction, bilateral 12/07/2017  . Otorrhea of left ear 11/26/2017  . Diverticulosis of colon 11/22/2016  . Restless leg syndrome 09/05/2016  . Myringotomy tube status 02/11/2016  . Impacted cerumen, bilateral 07/07/2015  . Mixed conductive and sensorineural hearing loss of right ear with restricted hearing of left ear 07/07/2015  . Perforation of right tympanic membrane 07/07/2015  . Urinary incontinence 12/24/2014  . Gout of foot 09/26/2013  . Onychomycosis 09/25/2013  . Personal history of colonic polyps 06/24/2013  . Health care maintenance 02/11/2013  . Hearing loss in left ear 02/11/2013  . OSA (obstructive sleep apnea) 12/11/2012  . Borderline personality disorder ([place]) 05/31/2011  . CKD (chronic kidney disease) stage 3, GFR 30-59 ml/min (HCC) 02/06/2011  . Psychosis ([place]) 12/20/2009  . Morbid obesity ([place]) 08/02/2009  . MENTAL RETARDATION 08/02/2009  . Essential hypertension, benign 08/02/2009  . GERD 08/02/2009  .  OSTEOARTHRITIS, MULTIPLE JOINTS 08/02/2009  . SLEEP DISORDER 08/02/2009  . Dyssomnia 08/02/2009    Past Surgical History:  Procedure Laterality Date  . COLONOSCOPY N/A 06/24/2013   Procedure: COLONOSCOPY;  Surgeon: Gatha Mayer, MD;  Location: [facility];  Service: Endoscopy;  Laterality: N/A;  . COLONOSCOPY WITH PROPOFOL N/A 10/05/2015   Procedure: COLONOSCOPY WITH PROPOFOL;  Surgeon: Gatha Mayer, MD;  Location: [facility];  Service: Endoscopy;  Laterality: N/A;  . cyst removed  2011   forehead  . TYMPANOSTOMY TUBE PLACEMENT  Bilateral yrs ago     OB History   No obstetric history on file.     Family History  Problem Relation Age of Onset  . Colon cancer Father        around age 11  . Diabetes Mother   . Esophageal cancer Neg Hx   . Pancreatic cancer Neg Hx   . Stomach cancer Neg Hx   . Liver disease Neg Hx     Social History   Tobacco Use  . Smoking status: Never Smoker  . Smokeless tobacco: Never Used  Substance Use Topics  . Alcohol use: No    Alcohol/week: 0.0 standard drinks  . Drug use: No    Home Medications Prior to Admission medications   Medication Sig Start Date End Date Taking? Authorizing Provider  acetaminophen (TYLENOL) 500 MG tablet Take 500 mg by mouth every 6 (six) hours as needed for headache.     [provider]  allopurinol (ZYLOPRIM) 100 MG tablet Take 1 tablet (100 mg total) by mouth daily. 02/11/20   [name], MD  aspirin (QC LO-DOSE ASPIRIN) 81 MG EC tablet Take 1 tablet (81 mg total) by mouth daily. Swallow whole. 11/23/16   Smiley Houseman, MD  benztropine (COGENTIN) 0.5 MG tablet Take 0.25 mg by mouth 2 (two) times daily.    [provider]  buPROPion (WELLBUTRIN XL) 150 MG 24 hr tablet Take 150 mg by mouth every morning.    [provider]  carvedilol (COREG) 25 MG tablet Take 1 tablet (25 mg total) by mouth 2 (two) times daily with a meal. 06/05/19   [name], MD  clonazePAM (KLONOPIN) 0.5 MG tablet Take 0.25 mg by mouth 2 (two) times daily as needed. 12/04/19   [provider]  DOK 100 MG capsule TAKE ONE CAPSULE TWICE DAILY AS NEEDED FOR MILD OR MODERATE CONSTIPATION Patient taking differently: Take 200 mg by mouth every morning.  02/26/17   Smiley Houseman, MD  FLUoxetine (PROZAC) 40 MG capsule Take 40 mg by mouth every morning.    [provider]  furosemide (LASIX) 80 MG tablet TAKE ONE TABLET DAILY 02/11/20   [name], MD  losartan (COZAAR) 25 MG tablet TAKE ONE-HALF TABLET DAILY 03/12/20    [name], MD  mirtazapine (REMERON) 30 MG tablet Take 30 mg by mouth at bedtime.  04/11/13   [provider]  ofloxacin (OCUFLOX) 0.3 % ophthalmic solution SMARTSIG:4 Drop(s) Left Ear Twice Daily 01/01/19   [provider]  omeprazole (PRILOSEC) 20 MG capsule TAKE ONE CAPSULE EACH DAY 11/24/19   [name], MD  prazosin (MINIPRESS) 1 MG capsule Take 1 mg by mouth at bedtime.  11/03/14   [provider]  pregabalin (LYRICA) 50 MG capsule TAKE ONE CAPSULE EACH DAY 11/03/19   [name], MD  risperidone (RISPERDAL) 4 MG tablet Take 4 mg by mouth 2 (two) times daily.  04/26/13   [provider]  spironolactone (ALDACTONE) 25 MG tablet Take 1 tablet (25 mg total) by mouth daily. 10/31/19   [name], MD  traZODone (DESYREL) 100 MG tablet Take 100 mg by mouth at bedtime.  07/14/12   [provider]  omeprazole (PRILOSEC) 20 MG capsule TAKE ONE CAPSULE EACH DAY 07/30/19   [name], MD    Allergies    Enalapril maleate, Hctz [hydrochlorothiazide], and Lasix [furosemide]  Review of Systems   Review of Systems  Constitutional: Negative for appetite change, chills and fever.  HENT: Negative for ear pain, rhinorrhea, sneezing and sore throat.   Eyes: Negative for photophobia and visual disturbance.  Respiratory: Positive for cough and shortness of breath. Negative for chest tightness and wheezing.   Cardiovascular: Negative for chest pain and palpitations.  Gastrointestinal: Positive for diarrhea. Negative for abdominal pain, blood in stool, constipation, nausea and vomiting.  Genitourinary: Negative for dysuria, hematuria and urgency.  Musculoskeletal: Negative for myalgias.  Skin: Negative for rash.  Neurological: Negative for dizziness, weakness and light-headedness.    Physical Exam Updated Vital Signs BP (!) 163/99   Pulse 73   Temp 98.6 F (37 C) (Oral)   Resp (!) 36   LMP 05/08/2013   SpO2 98%   Physical Exam Vitals and nursing  note reviewed.  Constitutional:      General: She is not in acute distress.    Appearance: She is well-developed. She is obese.     Comments: On 2 L of oxygen by nasal cannula.  HENT:     Head: Normocephalic and atraumatic.     Nose: Nose normal.  Eyes:     General: No scleral icterus.       Left eye: No discharge.     Conjunctiva/sclera: Conjunctivae normal.  Cardiovascular:     Rate and Rhythm: Normal rate and regular rhythm.     Heart sounds: Normal heart sounds. No murmur heard.  No friction rub. No gallop.   Pulmonary:     Effort: Pulmonary effort is normal. Tachypnea present. No respiratory distress.     Breath sounds: Examination of the right-middle field reveals rhonchi. Examination of the left-middle field reveals rhonchi. Examination of the right-lower field reveals rhonchi. Examination of the left-lower field reveals rhonchi. Rhonchi present.  Abdominal:     General: Bowel sounds are normal. There is no distension.     Palpations: Abdomen is soft.     Tenderness: There is no abdominal tenderness. There is no guarding.  Musculoskeletal:        General: Normal range of motion.     Cervical back: Normal range of motion and neck supple.     Right lower leg: Edema present.     Left lower leg: Edema present.  Skin:    General: Skin is warm and dry.     Findings: No rash.  Neurological:     Mental Status: She is alert.     Motor: No abnormal muscle tone.     Coordination: Coordination normal.     ED Results / Procedures / Treatments   Labs (all labs ordered are listed, but only abnormal results are displayed) Labs Reviewed  BASIC METABOLIC PANEL - Abnormal; Notable for the following components:      Result Value   Sodium 146 (*)    Creatinine, Ser 1.36 (*)    GFR, Estimated 45 (*)    All other components within normal limits  CBC WITH DIFFERENTIAL/PLATELET - Abnormal; Notable for the following components:   WBC  11.2 (*)    Hemoglobin 11.2 (*)    MCH 25.8 (*)     MCHC 29.7 (*)    Neutro Abs 8.4 (*)    All other components within normal limits  BRAIN NATRIURETIC PEPTIDE - Abnormal; Notable for the following components:   B Natriuretic Peptide 232.4 (*)    All other components within normal limits  RESPIRATORY PANEL BY RT PCR (FLU A&B, COVID)    EKG EKG Interpretation  Date/Time:  Tuesday March 16 2020 10:29:03 EDT Ventricular Rate:  77 PR Interval:    QRS Duration: 162 QT Interval:  448 QTC Calculation: 508 R Axis:   -59 Text Interpretation: Sinus rhythm LVH with IVCD, LAD and secondary repol abnrm Lateral leads are also involved Probable RV involvement, suggest recording right precordial leads When cmpared to prior, similar appaerance. No STEMI Confirmed by Antony Blackbird [phone]) on 03/16/2020 10:34:31 AM Also confirmed by Antony Blackbird [phone]), editor Hattie Perch [phone])  on 03/16/2020 12:13:57 PM   Radiology DG Chest Portable 1 View  Result Date: 03/16/2020 CLINICAL DATA:  59 year old female with shortness of breath. EXAM: PORTABLE CHEST 1 VIEW COMPARISON:  CTA chest 01/25/2019 and earlier. FINDINGS: Portable AP semi upright view at 1036 hours. Stable cardiomegaly and mediastinal contours. Increased indistinctness of pulmonary vasculature compared to September images. No pneumothorax. No consolidation. No pleural fluid is identified. Visualized tracheal air column is within normal limits. No acute osseous abnormality identified. Paucity of bowel gas in the upper abdomen. IMPRESSION: Cardiomegaly and low lung volumes with increased vascular congestion suspicious for mild or developing edema. Electronically Signed   By: Genevie Ann M.D.   On: 03/16/2020 10:41    Procedures Procedures (including critical care time)  Medications Ordered in ED Medications  furosemide (LASIX) injection 40 mg (has no administration in time range)    ED Course  I have reviewed the triage vital signs and the nursing notes.  Pertinent labs & imaging  results that were available during my care of the patient were reviewed by me and considered in my medical decision making (see chart for details).  Clinical Course as of Mar 16 1258  Tue Mar 16, 2020  1208 Sister states that she did take a dose of her lasix this morning.   [HK]  1208 Potassium: 3.6 [HK]  1208 Similar to baseline.  Creatinine(!): 1.36 [HK]    Clinical Course User Index [HK] Delia Heady, PA-C   MDM Rules/Calculators/A&P                          KYNZEE DEVINNEY was evaluated in Emergency Department on 03/16/20 for the symptoms described in the history of present illness. He/she was evaluated in the context of the global COVID-19 pandemic, which necessitated consideration that the patient might be at risk for infection with the SARS-CoV-2 virus that causes COVID-19. Institutional protocols and algorithms that pertain to the evaluation of patients at risk for COVID-19 are in a state of rapid change based on information released by regulatory bodies including the CDC and federal and state organizations. These policies and algorithms were followed during the patient's care in the ED.  59 year old female with a past medical history of MR, CHF with an EF of 45% on echo last year, hypertension, stage III CKD presenting to the ED with a chief complaint of shortness of breath.  History is provided by sister at the bedside.  3 to 4-day history of shortness of breath, leg  swelling and dry cough.  Has been compliant with her medications including her diuretic.  No chest pain, fever, abdominal pain.  She has had some episodes of nonbloody diarrhea.  She is fully vaccinated against Covid including booster 2 weeks ago.  On exam patient with crackles noted in bilateral lung fields.  She does have some pitting edema to bilateral lower extremities without tenderness.  She is on 2 L of oxygen, clinic notes from this morning show oxygen saturations of 77% on room air after walking into the room.  She  does not wear supplemental oxygen at baseline.  She is tachypneic.  EKG shows sinus rhythm, no changes from prior tracings.  Chest x-ray shows cardiomegaly, increased vascular congestion suspicious for developing edema.  CBC unremarkable.  BMP with creatinine at baseline.  BNP slightly elevated in the 200s.  Covid test is negative.  I have ordered IV Lasix for her, she will need to be admitted to family medicine service as recommended in their clinic this morning.  Patient and sister are agreeable to the plan.  She remains hemodynamically stable and on 2 L of oxygen by nasal cannula.   Portions of this note were generated with [employer]. Dictation errors may occur despite best attempts at proofreading.  Final Clinical Impression(s) / ED Diagnoses Final diagnoses:  Acute on chronic congestive heart failure, unspecified heart failure type [facility])  Hypoxia    Rx / DC Orders ED Discharge Orders    None       Delia Heady, PA-C 03/16/20 1259    Tegeler, Gwenyth Allegra, MD 03/16/20 1626

## 2020-03-17 ENCOUNTER — Observation Stay (HOSPITAL_BASED_OUTPATIENT_CLINIC_OR_DEPARTMENT_OTHER): Payer: Medicare Other

## 2020-03-17 DIAGNOSIS — I340 Nonrheumatic mitral (valve) insufficiency: Secondary | ICD-10-CM

## 2020-03-17 DIAGNOSIS — N1831 Chronic kidney disease, stage 3a: Secondary | ICD-10-CM

## 2020-03-17 DIAGNOSIS — F79 Unspecified intellectual disabilities: Secondary | ICD-10-CM

## 2020-03-17 DIAGNOSIS — I509 Heart failure, unspecified: Secondary | ICD-10-CM | POA: Diagnosis not present

## 2020-03-17 DIAGNOSIS — I5043 Acute on chronic combined systolic (congestive) and diastolic (congestive) heart failure: Secondary | ICD-10-CM | POA: Diagnosis not present

## 2020-03-17 DIAGNOSIS — R0600 Dyspnea, unspecified: Secondary | ICD-10-CM | POA: Diagnosis not present

## 2020-03-17 DIAGNOSIS — R0902 Hypoxemia: Secondary | ICD-10-CM | POA: Diagnosis not present

## 2020-03-17 LAB — ECHOCARDIOGRAM COMPLETE
AR max vel: 2.32 cm2
AV Peak grad: 15.2 mmHg
Ao pk vel: 1.95 m/s
Area-P 1/2: 5.02 cm2
Height: 64 in
S' Lateral: 3.8 cm
Weight: 5608 oz

## 2020-03-17 LAB — BASIC METABOLIC PANEL
Anion gap: 11 (ref 5–15)
Anion gap: 13 (ref 5–15)
BUN: 16 mg/dL (ref 6–20)
BUN: 15 mg/dL (ref 6–20)
CO2: 32 mmol/L (ref 22–32)
CO2: 32 mmol/L (ref 22–32)
Calcium: 9 mg/dL (ref 8.9–10.3)
Calcium: 9.1 mg/dL (ref 8.9–10.3)
Chloride: 101 mmol/L (ref 98–111)
Chloride: 99 mmol/L (ref 98–111)
Creatinine, Ser: 1.25 mg/dL — ABNORMAL HIGH (ref 0.44–1.00)
Creatinine, Ser: 1.22 mg/dL — ABNORMAL HIGH (ref 0.44–1.00)
GFR, Estimated: 49 mL/min — ABNORMAL LOW (ref >60)
GFR, Estimated: 51 mL/min — ABNORMAL LOW (ref >60)
Glucose, Bld: 99 mg/dL (ref 70–99)
Glucose, Bld: 125 mg/dL — ABNORMAL HIGH (ref 70–99)
Potassium: 2.9 mmol/L — ABNORMAL LOW (ref 3.5–5.1)
Potassium: 2.9 mmol/L — ABNORMAL LOW (ref 3.5–5.1)
Sodium: 144 mmol/L (ref 135–145)
Sodium: 144 mmol/L (ref 135–145)

## 2020-03-17 MED ORDER — POTASSIUM CHLORIDE CRYS ER 20 MEQ PO TBCR
40.0000 meq | EXTENDED_RELEASE_TABLET | Freq: Once | ORAL | Status: AC
  Administered 2020-03-17: 40 meq via ORAL
  Filled 2020-03-17: qty 2

## 2020-03-17 MED ORDER — LOSARTAN POTASSIUM 25 MG PO TABS
25.0000 mg | ORAL_TABLET | Freq: Every day | ORAL | Status: DC
  Administered 2020-03-18 – 2020-03-20 (×3): 25 mg via ORAL
  Filled 2020-03-17 (×3): qty 1

## 2020-03-17 MED ORDER — ENOXAPARIN SODIUM 80 MG/0.8ML ~~LOC~~ SOLN
80.0000 mg | Freq: Every day | SUBCUTANEOUS | Status: DC
  Administered 2020-03-18 – 2020-03-25 (×7): 80 mg via SUBCUTANEOUS
  Filled 2020-03-17 (×7): qty 0.8

## 2020-03-17 MED ORDER — PERFLUTREN LIPID MICROSPHERE
1.0000 mL | INTRAVENOUS | Status: AC | PRN
  Administered 2020-03-17: 2 mL via INTRAVENOUS
  Filled 2020-03-17: qty 10

## 2020-03-17 MED ORDER — POTASSIUM CHLORIDE 20 MEQ PO PACK: 40.0000 meq | PACK | Freq: Once | ORAL | Status: DC

## 2020-03-17 MED ORDER — FUROSEMIDE 10 MG/ML IJ SOLN
40.0000 mg | Freq: Every day | INTRAMUSCULAR | Status: DC
  Administered 2020-03-18: 40 mg via INTRAVENOUS
  Filled 2020-03-17: qty 4

## 2020-03-17 NOTE — H&P (View-Only) (Signed)
Cardiology Consultation:   Patient ID: RAYSA BOSAK MRN: [id number]; DOB: July 01, 1960  Admit date: 03/16/2020 Date of Consult: 03/17/2020  Primary Care Provider: [name], MD [facility] Cardiologist: Minus Breeding, MD  [facility] Electrophysiologist:  None    Patient Profile:   Lauren Wood is a 59 y.o. female with a hx of chronic diastolic heart failure, HTN, CKD stage III, OSA on CPAP, borderline personality disorder, intellectual disability who is being seen today for the evaluation of dyspnea at the request of Dr. Thompson Grayer.  History of Present Illness:   Ms. Kupfer was hospitalized 04/2018 with acute on chronic diastolic heart failure and obesity hypoventilation syndrome.  Echo 01/2019 demonstrated mildly reduced EF of 40-45%, mild DD, and no significant valvular disease. She was again hospitalized 2020 with respiratory failure requiring IV diuretic. She was seen in follow up with Dr. Percival Spanish 02/19/19 and had lost 37 lbs. She was doing well at that time.   She presented to family medicine practice 03/16/20 with dyspnea found to be tachypneic and hypoxic to 77%. She ws sent to the ER. She received her COVID booster 2 weeks ago.  O2 sats improved with 2L [place]. CXR remarkable for vascular congestion. BNP mildly elevated 232. Creatinine 1.36 at baseline. She was hypertensive to 140-160s. EKG with known LBBB.  Prior to arrival, she was maintained on ASA, coreg 25 mg BID, lasix 80 mg daily, 12.5 mg losartan, and 25 mg spironolactone.   Echo this admission showed stable EF of 40-45% but now severe mitral valve regurgitation. Cardiology was consulted. She has been treated with 40 mg IV lasix with good diuresis.   Patient notes that she feels stiff (recently being UOB to chair and just back) and denies SOB or weight gain. Notes her SOB has resolved and has no further complaints.  Past Medical History:  Diagnosis Date  . ALLERGIC RHINITIS 08/02/2009  . Arthritis   . Boil of vulva  11/07/2012  . Borderline personality disorder ([place]) 05/31/2011  . CKD (chronic kidney disease) stage 3, GFR 30-59 ml/min (HCC)    chronic  . Gout of foot 09/26/2013   L foot 09/2013. In the setting of HCTZ 25 mg and lasix 40 mg daily. Stopped HCTZ after decreasing to 12.5 mg. Treated with colchicine.    Marland Kitchen Heart failure (HCC)    chronic  . Heart murmur   . Hip pain 12/03/2012  . HTN (hypertension)   . 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 Obesity   . 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 PRESSURE ULCER OTHER SITE 10/01/2009   hx of  . Sleep apnea    uses cpap, setting of 2  . Unspecified psychosis 12/20/2009    Past Surgical History:  Procedure Laterality Date  . COLONOSCOPY N/A 06/24/2013   Procedure: COLONOSCOPY;  Surgeon: Gatha Mayer, MD;  Location: [facility];  Service: Endoscopy;  Laterality: N/A;  . COLONOSCOPY WITH PROPOFOL N/A 10/05/2015   Procedure: COLONOSCOPY WITH PROPOFOL;  Surgeon: Gatha Mayer, MD;  Location: [facility] ENDOSCOPY;  Service: Endoscopy;  Laterality: N/A;  . cyst removed  2011   forehead  . TYMPANOSTOMY TUBE PLACEMENT Bilateral yrs ago     Home Medications:  Prior to Admission medications   Medication Sig Start Date End Date Taking? Authorizing Provider  acetaminophen (  TYLENOL) 500 MG tablet Take 500 mg by mouth every 6 (six) hours as needed for headache.    Yes [provider]  allopurinol (ZYLOPRIM) 100 MG tablet Take 1 tablet (100 mg total) by mouth daily. 02/11/20  Yes [name], MD  aspirin (QC LO-DOSE ASPIRIN) 81 MG EC tablet Take 1 tablet (81 mg total) by mouth daily. Swallow whole. 11/23/16  Yes Smiley Houseman, MD  benztropine (COGENTIN) 0.5 MG tablet Take 0.25 mg by mouth 2 (two) times daily.   Yes [provider]  buPROPion (WELLBUTRIN XL) 150 MG 24 hr tablet Take 150  mg by mouth every morning.   Yes [provider]  carvedilol (COREG) 25 MG tablet Take 1 tablet (25 mg total) by mouth 2 (two) times daily with a meal. 06/05/19  Yes [name], MD  clonazePAM (KLONOPIN) 0.5 MG tablet Take 0.25 mg by mouth 2 (two) times daily as needed for anxiety.  12/04/19  Yes [provider]  DOK 100 MG capsule TAKE ONE CAPSULE TWICE DAILY AS NEEDED FOR MILD OR MODERATE CONSTIPATION Patient taking differently: Take 200 mg by mouth daily.  02/26/17  Yes Smiley Houseman, MD  FLUoxetine (PROZAC) 40 MG capsule Take 40 mg by mouth every morning.   Yes [provider]  furosemide (LASIX) 80 MG tablet TAKE ONE TABLET DAILY Patient taking differently: Take 80 mg by mouth daily.  02/11/20  Yes [name], MD  losartan (COZAAR) 25 MG tablet TAKE ONE-HALF TABLET DAILY Patient taking differently: Take 12.5 mg by mouth daily.  03/12/20  Yes [name], MD  mirtazapine (REMERON) 30 MG tablet Take 30 mg by mouth at bedtime.  04/11/13  Yes [provider]  omeprazole (PRILOSEC) 20 MG capsule TAKE ONE CAPSULE EACH DAY Patient taking differently: Take 20 mg by mouth daily.  11/24/19  Yes [name], MD  prazosin (MINIPRESS) 1 MG capsule Take 1 mg by mouth at bedtime.  11/03/14  Yes [provider]  pregabalin (LYRICA) 50 MG capsule TAKE ONE CAPSULE EACH DAY Patient taking differently: Take 50 mg by mouth daily.  11/03/19  Yes [name], MD  risperidone (RISPERDAL) 4 MG tablet Take 4 mg by mouth 2 (two) times daily.  04/26/13  Yes [provider]  spironolactone (ALDACTONE) 25 MG tablet Take 1 tablet (25 mg total) by mouth daily. 10/31/19  Yes [name], MD  traZODone (DESYREL) 100 MG tablet Take 100 mg by mouth at bedtime.  07/14/12  Yes [provider]  omeprazole (PRILOSEC) 20 MG capsule TAKE ONE CAPSULE EACH DAY 07/30/19   [name], MD    Inpatient Medications: Scheduled Meds: . aspirin  81 mg Oral Daily    . benztropine  0.25 mg Oral BID  . buPROPion  150 mg Oral q morning - 10a  . [START ON 03/18/2020] enoxaparin (LOVENOX) injection  80 mg Subcutaneous Daily  . FLUoxetine  40 mg Oral q morning - 10a  . [START ON 03/18/2020] furosemide  40 mg Intravenous Daily  . losartan  12.5 mg Oral Daily  . mirtazapine  30 mg Oral QHS  . pantoprazole  40 mg Oral Daily  . potassium chloride  40 mEq Oral Once  . prazosin  1 mg Oral QHS  . pregabalin  50 mg Oral Daily  . risperidone  4 mg Oral BID  . spironolactone  25 mg Oral Daily  . traZODone  100 mg Oral QHS   Continuous Infusions:  PRN Meds:  Allergies:    Allergies  Allergen Reactions  . Enalapril Maleate Other (See Comments)    Unknown reaction  . Hctz [Hydrochlorothiazide] Other (See Comments)    Hyperuricemia with Lasix   . Lasix [Furosemide] Other (See Comments)    Hyperuricemia with HCTZ      Social History:   Social History   Socioeconomic History  . Marital status: Single    Spouse name: Not on file  . Number of children: 0  . Years of education: Not on file  . Highest education level: Not on file  Occupational History  . Occupation: Disabled  Tobacco Use  . Smoking status: Never Smoker  . Smokeless tobacco: Never Used  Substance and Sexual Activity  . Alcohol use: No    Alcohol/week: 0.0 standard drinks  . Drug use: No  . Sexual activity: Never  Other Topics Concern  . Not on file  Social History Narrative   Goes to adult daycare.  Lives with sister.           Social Determinants of Health   Financial Resource Strain:   . Difficulty of Paying Living Expenses: Not on file  Food Insecurity:   . Worried About Charity fundraiser in the Last Year: Not on file  . Ran Out of Food in the Last Year: Not on file  Transportation Needs:   . Lack of Transportation (Medical): Not on file  . Lack of Transportation (Non-Medical): Not on file  Physical Activity:   . Days of Exercise per Week: Not on file  . Minutes  of Exercise per Session: Not on file  Stress:   . Feeling of Stress : Not on file  Social Connections:   . Frequency of Communication with Friends and Family: Not on file  . Frequency of Social Gatherings with Friends and Family: Not on file  . Attends Religious Services: Not on file  . Active Member of Clubs or Organizations: Not on file  . Attends Archivist Meetings: Not on file  . Marital Status: Not on file  Intimate Partner Violence:   . Fear of Current or Ex-Partner: Not on file  . Emotionally Abused: Not on file  . Physically Abused: Not on file  . Sexually Abused: Not on file    Family History:    Family History  Problem Relation Age of Onset  . Colon cancer Father        around age 61  . Diabetes Mother   . Esophageal cancer Neg Hx   . Pancreatic cancer Neg Hx   . Stomach cancer Neg Hx   . Liver disease Neg Hx      ROS:  Please see the history of present illness.   All other ROS reviewed and negative.     Physical Exam/Data:   Vitals:   03/17/20 0025 03/17/20 0438 03/17/20 0738 03/17/20 1343  BP: (!) 143/78 (!) 146/83 (!) 156/87 (!) 153/78  Pulse: 75 81 87 92  Resp: 18 20 20 20   Temp: 98.7 F (37.1 C) 99.1 F (37.3 C) 98.9 F (37.2 C) 99.8 F (37.7 C)  TempSrc: Oral Oral Oral Oral  SpO2: 94% 91% 97% 98%  Weight:  (!) 159 kg    Height:        Intake/Output Summary (Last 24 hours) at 03/17/2020 1630 Last data filed at 03/17/2020 1314 Gross per 24 hour  Intake 240 ml  Output 1400 ml  Net -1160 ml   Last 3 Weights 03/17/2020  03/16/2020 03/16/2020  Weight (lbs) 350 lb 8 oz 349 lb 340 lb 9.6 oz  Weight (kg) 158.986 kg 158.305 kg 154.495 kg     Body mass index is 60.16 kg/m.  General:  Morbidly Obese in no acute distress HEENT: normal Lymph: no adenopathy Endocrine:  No thryomegaly Vascular: No carotid bruits; FA pulses 2+ bilaterally without bruits  Cardiac:  normal S1, S2; RRR II/VI holosystolic murmur best hear RUSB Lungs:  clear to  auscultation bilaterally, no wheezing, rhonchi or rales, poor air movement Abd: soft, nontender, no hepatomegaly  Ext: no edema Musculoskeletal:  No deformities, BUE and BLE strength normal and equal Skin: warm and dry  Neuro:  CNs 2-12 intact, no focal abnormalities noted Psych:  Normal affect   EKG:  The EKG was personally reviewed and demonstrates:  Sinus rhythm with HR  Telemetry:  Telemetry was personally reviewed and demonstrates:  Sinus rhythm with occasional PVC  Relevant CV Studies:  Personally reviewed Echo:  LVEF 40-45% in off angle views Dilated coronary sinus.  LV dilation compared to prior study.  Severe, eccentric mitral regurgitation.  Echo 03/17/20: 1. Abnormal mitral valve with probable severe mitral valve regurgitation,  there may be multiple jets, and jet is eccentric. Suboptimally  interrogated on this exam. There is evidence of LV dysfunction, and  possible LV dilation. MR is best visualized in  clip 51. Consider TEE if clinically indicated to better assess valve  structure which is not well assessed on this exam.  2. Left ventricular ejection fraction, by estimation, is 40 to 45%. LV  end diastolic dimension is 60 mm, but indexes to normal size based on BSA.  The left ventricle has mildly decreased function. Left ventricular  endocardial border not optimally defined  to evaluate regional wall motion despite use of Definity. Global  hypokinesis. There is mild left ventricular hypertrophy. Left ventricular  diastolic parameters are indeterminate.  3. Right ventricular systolic function is mildly reduced. The right  ventricular size is mildly enlarged. There is mildly elevated pulmonary  artery systolic pressure. The estimated right ventricular systolic  pressure is 87.5 mmHg.  4. Left atrial size was mild to moderately dilated.  5. The mitral valve was not well visualized. Severe mitral valve  regurgitation.  6. The aortic valve was not well visualized.  Aortic valve regurgitation  is not visualized. No aortic stenosis is present.  7. The inferior vena cava is dilated in size with <50% respiratory  variability, suggesting right atrial pressure of 15 mmHg.   Laboratory Data:  High Sensitivity Troponin:   Recent Labs  Lab 03/16/20 1611 03/16/20 1946  TROPONINIHS 35* 30*     Chemistry Recent Labs  Lab 03/16/20 1038 03/17/20 0229 03/17/20 1504  NA 146* 144 144  K 3.6 2.9* 2.9*  CL 103 101 99  CO2 30 32 32  GLUCOSE 92 99 125*  BUN 20 16 15   CREATININE 1.36* 1.25* 1.22*  CALCIUM 9.2 9.0 9.1  GFRNONAA 45* 49* 51*  ANIONGAP 13 11 13     No results for input(s): PROT, ALBUMIN, AST, ALT, ALKPHOS, BILITOT in the last 168 hours. Hematology Recent Labs  Lab 03/16/20 1038  WBC 11.2*  RBC 4.34  HGB 11.2*  HCT 37.7  MCV 86.9  MCH 25.8*  MCHC 29.7*  RDW 14.6  PLT 263   BNP Recent Labs  Lab 03/16/20 1038  BNP 232.4*    DDimer No results for input(s): DDIMER in the last 168 hours.  Radiology/Studies:  DG Chest  Portable 1 View  Result Date: 03/16/2020 CLINICAL DATA:  59 year old female with shortness of breath. EXAM: PORTABLE CHEST 1 VIEW COMPARISON:  CTA chest 01/25/2019 and earlier. FINDINGS: Portable AP semi upright view at 1036 hours. Stable cardiomegaly and mediastinal contours. Increased indistinctness of pulmonary vasculature compared to September images. No pneumothorax. No consolidation. No pleural fluid is identified. Visualized tracheal air column is within normal limits. No acute osseous abnormality identified. Paucity of bowel gas in the upper abdomen. IMPRESSION: Cardiomegaly and low lung volumes with increased vascular congestion suspicious for mild or developing edema. Electronically Signed   By: Genevie Ann M.D.   On: 03/16/2020 10:41   ECHOCARDIOGRAM COMPLETE  Result Date: 03/17/2020    ECHOCARDIOGRAM REPORT   Patient Name:   [facility] A Cauble Date of Exam: 03/17/2020 Medical Rec #:  [id number]       Height:        64.0 in Accession #:    [id number]      Weight:       350.5 lb Date of Birth:  1960-09-26       BSA:          2.483 m Patient Age:    49 years        BP:           156/87 mmHg Patient Gender: F               HR:           87 bpm. Exam Location:  Inpatient Procedure: 2D Echo, Cardiac Doppler, Color Doppler and Intracardiac            Opacification Agent Indications:    Dyspnea  History:        Patient has prior history of Echocardiogram examinations, most                 recent 01/25/2019. CHF, Arrythmias:LBBB; Risk                 Factors:Hypertension and Morbid obesity. Resp. failure, CKD.  Sonographer:    Dustin Flock Referring Phys: [id number] MARGARET E PRAY  Sonographer Comments: Patient is morbidly obese, Technically challenging study due to limited acoustic windows and Technically difficult study due to poor echo windows. Image acquisition challenging due to patient body habitus. IMPRESSIONS  1. Abnormal mitral valve with probable severe mitral valve regurgitation, there may be multiple jets, and jet is eccentric. Suboptimally interrogated on this exam. There is evidence of LV dysfunction, and possible LV dilation. MR is best visualized in clip 51. Consider TEE if clinically indicated to better assess valve structure which is not well assessed on this exam.  2. Left ventricular ejection fraction, by estimation, is 40 to 45%. LV end diastolic dimension is 60 mm, but indexes to normal size based on BSA. The left ventricle has mildly decreased function. Left ventricular endocardial border not optimally defined  to evaluate regional wall motion despite use of Definity. Global hypokinesis. There is mild left ventricular hypertrophy. Left ventricular diastolic parameters are indeterminate.  3. Right ventricular systolic function is mildly reduced. The right ventricular size is mildly enlarged. There is mildly elevated pulmonary artery systolic pressure. The estimated right ventricular systolic pressure is 16.3  mmHg.  4. Left atrial size was mild to moderately dilated.  5. The mitral valve was not well visualized. Severe mitral valve regurgitation.  6. The aortic valve was not well visualized. Aortic valve regurgitation is not visualized. No aortic stenosis is present.  7.  The inferior vena cava is dilated in size with <50% respiratory variability, suggesting right atrial pressure of 15 mmHg. FINDINGS  Left Ventricle: Left ventricular ejection fraction, by estimation, is 40 to 45%. The left ventricle has mildly decreased function. Left ventricular endocardial border not optimally defined to evaluate regional wall motion. Definity contrast agent was given IV to delineate the left ventricular endocardial borders. The left ventricular internal cavity size was normal in size. There is mild left ventricular hypertrophy. Left ventricular diastolic parameters are indeterminate. Right Ventricle: The right ventricular size is mildly enlarged. Right vetricular wall thickness was not assessed. Right ventricular systolic function is mildly reduced. There is mildly elevated pulmonary artery systolic pressure. The tricuspid regurgitant velocity is 2.61 m/s, and with an assumed right atrial pressure of 15 mmHg, the estimated right ventricular systolic pressure is 26.3 mmHg. Left Atrium: Left atrial size was mild to moderately dilated. Right Atrium: Right atrial size was normal in size. Pericardium: There is no evidence of pericardial effusion. Mitral Valve: The mitral valve was not well visualized. Severe mitral valve regurgitation. Tricuspid Valve: The tricuspid valve is not well visualized. Tricuspid valve regurgitation is trivial. Aortic Valve: The aortic valve was not well visualized. Aortic valve regurgitation is not visualized. No aortic stenosis is present. Aortic valve peak gradient measures 15.2 mmHg. Pulmonic Valve: The pulmonic valve was not well visualized. Pulmonic valve regurgitation is trivial. Aorta: The aortic root is  normal in size and structure. Venous: The inferior vena cava is dilated in size with less than 50% respiratory variability, suggesting right atrial pressure of 15 mmHg. IAS/Shunts: No atrial level shunt detected by color flow Doppler.  LEFT VENTRICLE PLAX 2D LVIDd:         6.00 cm  Diastology LVIDs:         3.80 cm  LV e' medial:    13.60 cm/s LV PW:         1.20 cm  LV E/e' medial:  7.4 LV IVS:        0.80 cm  LV e' lateral:   7.07 cm/s LVOT diam:     2.20 cm  LV E/e' lateral: 14.1 LV SV:         79 LV SV Index:   32 LVOT Area:     3.80 cm  RIGHT VENTRICLE RV Basal diam:  3.20 cm RV S prime:     5.22 cm/s TAPSE (M-mode): 2.6 cm LEFT ATRIUM             Index       RIGHT ATRIUM           Index LA diam:        4.60 cm 1.85 cm/m  RA Area:     16.90 cm LA Vol (A2C):   75.7 ml 30.49 ml/m RA Volume:   45.40 ml  18.28 ml/m LA Vol (A4C):   94.6 ml 38.10 ml/m LA Biplane Vol: 88.4 ml 35.60 ml/m  AORTIC VALVE AV Area (Vmax): 2.32 cm AV Vmax:        195.00 cm/s AV Peak Grad:   15.2 mmHg LVOT Vmax:      119.00 cm/s LVOT Vmean:     83.300 cm/s LVOT VTI:       0.207 m  AORTA Ao Root diam: 2.60 cm MITRAL VALVE                TRICUSPID VALVE MV Area (PHT): 5.02 cm     TR Peak grad:  27.2 mmHg MV Decel Time: 151 msec     TR Vmax:        261.00 cm/s MV E velocity: 100.00 cm/s MV A velocity: 86.30 cm/s   SHUNTS MV E/A ratio:  1.16         Systemic VTI:  0.21 m                             Systemic Diam: 2.20 cm Cherlynn Kaiser MD Electronically signed by Cherlynn Kaiser MD Signature Date/Time: 03/17/2020/2:07:43 PM    Final      Assessment and Plan:   Acute on chronic systolic and diastolic heart failure Sever mitral regurgitation Morbid Obesity CKD Stage III HTN Social Determinants of Health:  Poor insight on disease process secondary to intellectual disability - has received 40 mg IV lasix and 80 mEq K - NYHA class II, Stage C, hypervolemi - Would admit and continue lasix 40 mg BID and escalate with goal net  negative 2-3L daily (holding on placing the order as patient need significant K repletion prior) - Strict I/Os, daily weights, and fluid restriction of < 2 L  - Replace electrolytes PRN and keep K>4 and Mg>2. - BID BMP, Mg until stable with aggressive diuresis - Holding  home beta blocker due to decompensation.  - would increase losartan to 25 mg daily (this would also help with the hypokalemia) - Continue spironolactone 25 mg daily  - when approaching euvolemia, would consider TEE if within goals of care (would need to discuss that with sister or POA)  Discussed with patient and nursing; reached out to primary team  For questions or updates, please contact [place] Please consult [email or web address] for contact info under    Signed,  Werner Lean, MD

## 2020-03-17 NOTE — Consult Note (Signed)
Cardiology Consultation:   Patient ID: MALERIE EAKINS MRN: [id number]; DOB: 1961/01/27  Admit date: 03/16/2020 Date of Consult: 03/17/2020  Primary Care Provider: [name], MD [facility] Cardiologist: Minus Breeding, MD  [facility] Electrophysiologist:  None    Patient Profile:   DEMARIS BOUSQUET is a 59 y.o. female with a hx of chronic diastolic heart failure, HTN, CKD stage III, OSA on CPAP, borderline personality disorder, intellectual disability who is being seen today for the evaluation of dyspnea at the request of Dr. Thompson Grayer.  History of Present Illness:   Ms. Briones was hospitalized 04/2018 with acute on chronic diastolic heart failure and obesity hypoventilation syndrome.  Echo 01/2019 demonstrated mildly reduced EF of 40-45%, mild DD, and no significant valvular disease. She was again hospitalized 2020 with respiratory failure requiring IV diuretic. She was seen in follow up with Dr. Percival Spanish 02/19/19 and had lost 37 lbs. She was doing well at that time.   She presented to family medicine practice 03/16/20 with dyspnea found to be tachypneic and hypoxic to 77%. She ws sent to the ER. She received her COVID booster 2 weeks ago.  O2 sats improved with 2L [place]. CXR remarkable for vascular congestion. BNP mildly elevated 232. Creatinine 1.36 at baseline. She was hypertensive to 140-160s. EKG with known LBBB.  Prior to arrival, she was maintained on ASA, coreg 25 mg BID, lasix 80 mg daily, 12.5 mg losartan, and 25 mg spironolactone.   Echo this admission showed stable EF of 40-45% but now severe mitral valve regurgitation. Cardiology was consulted. She has been treated with 40 mg IV lasix with good diuresis.   Patient notes that she feels stiff (recently being UOB to chair and just back) and denies SOB or weight gain. Notes her SOB has resolved and has no further complaints.  Past Medical History:  Diagnosis Date  . ALLERGIC RHINITIS 08/02/2009  . Arthritis   . Boil of vulva  11/07/2012  . Borderline personality disorder ([place]) 05/31/2011  . CKD (chronic kidney disease) stage 3, GFR 30-59 ml/min (HCC)    chronic  . Gout of foot 09/26/2013   L foot 09/2013. In the setting of HCTZ 25 mg and lasix 40 mg daily. Stopped HCTZ after decreasing to 12.5 mg. Treated with colchicine.    Marland Kitchen Heart failure (HCC)    chronic  . Heart murmur   . Hip pain 12/03/2012  . HTN (hypertension)   . 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 Obesity   . 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 PRESSURE ULCER OTHER SITE 10/01/2009   hx of  . Sleep apnea    uses cpap, setting of 2  . Unspecified psychosis 12/20/2009    Past Surgical History:  Procedure Laterality Date  . COLONOSCOPY N/A 06/24/2013   Procedure: COLONOSCOPY;  Surgeon: Gatha Mayer, MD;  Location: [facility];  Service: Endoscopy;  Laterality: N/A;  . COLONOSCOPY WITH PROPOFOL N/A 10/05/2015   Procedure: COLONOSCOPY WITH PROPOFOL;  Surgeon: Gatha Mayer, MD;  Location: [facility];  Service: Endoscopy;  Laterality: N/A;  . cyst removed  2011   forehead  . TYMPANOSTOMY TUBE PLACEMENT Bilateral yrs ago     Home Medications:  Prior to Admission medications   Medication Sig Start Date End Date Taking? Authorizing Provider  acetaminophen (  TYLENOL) 500 MG tablet Take 500 mg by mouth every 6 (six) hours as needed for headache.    Yes [provider]  allopurinol (ZYLOPRIM) 100 MG tablet Take 1 tablet (100 mg total) by mouth daily. 02/11/20  Yes [name], MD  aspirin (QC LO-DOSE ASPIRIN) 81 MG EC tablet Take 1 tablet (81 mg total) by mouth daily. Swallow whole. 11/23/16  Yes Smiley Houseman, MD  benztropine (COGENTIN) 0.5 MG tablet Take 0.25 mg by mouth 2 (two) times daily.   Yes [provider]  buPROPion (WELLBUTRIN XL) 150 MG 24 hr tablet Take 150  mg by mouth every morning.   Yes [provider]  carvedilol (COREG) 25 MG tablet Take 1 tablet (25 mg total) by mouth 2 (two) times daily with a meal. 06/05/19  Yes [name], MD  clonazePAM (KLONOPIN) 0.5 MG tablet Take 0.25 mg by mouth 2 (two) times daily as needed for anxiety.  12/04/19  Yes [provider]  DOK 100 MG capsule TAKE ONE CAPSULE TWICE DAILY AS NEEDED FOR MILD OR MODERATE CONSTIPATION Patient taking differently: Take 200 mg by mouth daily.  02/26/17  Yes Smiley Houseman, MD  FLUoxetine (PROZAC) 40 MG capsule Take 40 mg by mouth every morning.   Yes [provider]  furosemide (LASIX) 80 MG tablet TAKE ONE TABLET DAILY Patient taking differently: Take 80 mg by mouth daily.  02/11/20  Yes [name], MD  losartan (COZAAR) 25 MG tablet TAKE ONE-HALF TABLET DAILY Patient taking differently: Take 12.5 mg by mouth daily.  03/12/20  Yes [name], MD  mirtazapine (REMERON) 30 MG tablet Take 30 mg by mouth at bedtime.  04/11/13  Yes [provider]  omeprazole (PRILOSEC) 20 MG capsule TAKE ONE CAPSULE EACH DAY Patient taking differently: Take 20 mg by mouth daily.  11/24/19  Yes [name], MD  prazosin (MINIPRESS) 1 MG capsule Take 1 mg by mouth at bedtime.  11/03/14  Yes [provider]  pregabalin (LYRICA) 50 MG capsule TAKE ONE CAPSULE EACH DAY Patient taking differently: Take 50 mg by mouth daily.  11/03/19  Yes [name], MD  risperidone (RISPERDAL) 4 MG tablet Take 4 mg by mouth 2 (two) times daily.  04/26/13  Yes [provider]  spironolactone (ALDACTONE) 25 MG tablet Take 1 tablet (25 mg total) by mouth daily. 10/31/19  Yes [name], MD  traZODone (DESYREL) 100 MG tablet Take 100 mg by mouth at bedtime.  07/14/12  Yes [provider]  omeprazole (PRILOSEC) 20 MG capsule TAKE ONE CAPSULE EACH DAY 07/30/19   [name], MD    Inpatient Medications: Scheduled Meds: . aspirin  81 mg Oral Daily    . benztropine  0.25 mg Oral BID  . buPROPion  150 mg Oral q morning - 10a  . [START ON 03/18/2020] enoxaparin (LOVENOX) injection  80 mg Subcutaneous Daily  . FLUoxetine  40 mg Oral q morning - 10a  . [START ON 03/18/2020] furosemide  40 mg Intravenous Daily  . losartan  12.5 mg Oral Daily  . mirtazapine  30 mg Oral QHS  . pantoprazole  40 mg Oral Daily  . potassium chloride  40 mEq Oral Once  . prazosin  1 mg Oral QHS  . pregabalin  50 mg Oral Daily  . risperidone  4 mg Oral BID  . spironolactone  25 mg Oral Daily  . traZODone  100 mg Oral QHS   Continuous Infusions:  PRN Meds:  Allergies:    Allergies  Allergen Reactions  . Enalapril Maleate Other (See Comments)    Unknown reaction  . Hctz [Hydrochlorothiazide] Other (See Comments)    Hyperuricemia with Lasix   . Lasix [Furosemide] Other (See Comments)    Hyperuricemia with HCTZ      Social History:   Social History   Socioeconomic History  . Marital status: Single    Spouse name: Not on file  . Number of children: 0  . Years of education: Not on file  . Highest education level: Not on file  Occupational History  . Occupation: Disabled  Tobacco Use  . Smoking status: Never Smoker  . Smokeless tobacco: Never Used  Substance and Sexual Activity  . Alcohol use: No    Alcohol/week: 0.0 standard drinks  . Drug use: No  . Sexual activity: Never  Other Topics Concern  . Not on file  Social History Narrative   Goes to adult daycare.  Lives with sister.           Social Determinants of Health   Financial Resource Strain:   . Difficulty of Paying Living Expenses: Not on file  Food Insecurity:   . Worried About Charity fundraiser in the Last Year: Not on file  . Ran Out of Food in the Last Year: Not on file  Transportation Needs:   . Lack of Transportation (Medical): Not on file  . Lack of Transportation (Non-Medical): Not on file  Physical Activity:   . Days of Exercise per Week: Not on file  . Minutes  of Exercise per Session: Not on file  Stress:   . Feeling of Stress : Not on file  Social Connections:   . Frequency of Communication with Friends and Family: Not on file  . Frequency of Social Gatherings with Friends and Family: Not on file  . Attends Religious Services: Not on file  . Active Member of Clubs or Organizations: Not on file  . Attends Archivist Meetings: Not on file  . Marital Status: Not on file  Intimate Partner Violence:   . Fear of Current or Ex-Partner: Not on file  . Emotionally Abused: Not on file  . Physically Abused: Not on file  . Sexually Abused: Not on file    Family History:    Family History  Problem Relation Age of Onset  . Colon cancer Father        around age 58  . Diabetes Mother   . Esophageal cancer Neg Hx   . Pancreatic cancer Neg Hx   . Stomach cancer Neg Hx   . Liver disease Neg Hx      ROS:  Please see the history of present illness.   All other ROS reviewed and negative.     Physical Exam/Data:   Vitals:   03/17/20 0025 03/17/20 0438 03/17/20 0738 03/17/20 1343  BP: (!) 143/78 (!) 146/83 (!) 156/87 (!) 153/78  Pulse: 75 81 87 92  Resp: 18 20 20 20   Temp: 98.7 F (37.1 C) 99.1 F (37.3 C) 98.9 F (37.2 C) 99.8 F (37.7 C)  TempSrc: Oral Oral Oral Oral  SpO2: 94% 91% 97% 98%  Weight:  (!) 159 kg    Height:        Intake/Output Summary (Last 24 hours) at 03/17/2020 1630 Last data filed at 03/17/2020 1314 Gross per 24 hour  Intake 240 ml  Output 1400 ml  Net -1160 ml   Last 3 Weights 03/17/2020  03/16/2020 03/16/2020  Weight (lbs) 350 lb 8 oz 349 lb 340 lb 9.6 oz  Weight (kg) 158.986 kg 158.305 kg 154.495 kg     Body mass index is 60.16 kg/m.  General:  Morbidly Obese in no acute distress HEENT: normal Lymph: no adenopathy Endocrine:  No thryomegaly Vascular: No carotid bruits; FA pulses 2+ bilaterally without bruits  Cardiac:  normal S1, S2; RRR II/VI holosystolic murmur best hear RUSB Lungs:  clear to  auscultation bilaterally, no wheezing, rhonchi or rales, poor air movement Abd: soft, nontender, no hepatomegaly  Ext: no edema Musculoskeletal:  No deformities, BUE and BLE strength normal and equal Skin: warm and dry  Neuro:  CNs 2-12 intact, no focal abnormalities noted Psych:  Normal affect   EKG:  The EKG was personally reviewed and demonstrates:  Sinus rhythm with HR  Telemetry:  Telemetry was personally reviewed and demonstrates:  Sinus rhythm with occasional PVC  Relevant CV Studies:  Personally reviewed Echo:  LVEF 40-45% in off angle views Dilated coronary sinus.  LV dilation compared to prior study.  Severe, eccentric mitral regurgitation.  Echo 03/17/20: 1. Abnormal mitral valve with probable severe mitral valve regurgitation,  there may be multiple jets, and jet is eccentric. Suboptimally  interrogated on this exam. There is evidence of LV dysfunction, and  possible LV dilation. MR is best visualized in  clip 51. Consider TEE if clinically indicated to better assess valve  structure which is not well assessed on this exam.  2. Left ventricular ejection fraction, by estimation, is 40 to 45%. LV  end diastolic dimension is 60 mm, but indexes to normal size based on BSA.  The left ventricle has mildly decreased function. Left ventricular  endocardial border not optimally defined  to evaluate regional wall motion despite use of Definity. Global  hypokinesis. There is mild left ventricular hypertrophy. Left ventricular  diastolic parameters are indeterminate.  3. Right ventricular systolic function is mildly reduced. The right  ventricular size is mildly enlarged. There is mildly elevated pulmonary  artery systolic pressure. The estimated right ventricular systolic  pressure is 27.7 mmHg.  4. Left atrial size was mild to moderately dilated.  5. The mitral valve was not well visualized. Severe mitral valve  regurgitation.  6. The aortic valve was not well visualized.  Aortic valve regurgitation  is not visualized. No aortic stenosis is present.  7. The inferior vena cava is dilated in size with <50% respiratory  variability, suggesting right atrial pressure of 15 mmHg.   Laboratory Data:  High Sensitivity Troponin:   Recent Labs  Lab 03/16/20 1611 03/16/20 1946  TROPONINIHS 35* 30*     Chemistry Recent Labs  Lab 03/16/20 1038 03/17/20 0229 03/17/20 1504  NA 146* 144 144  K 3.6 2.9* 2.9*  CL 103 101 99  CO2 30 32 32  GLUCOSE 92 99 125*  BUN 20 16 15   CREATININE 1.36* 1.25* 1.22*  CALCIUM 9.2 9.0 9.1  GFRNONAA 45* 49* 51*  ANIONGAP 13 11 13     No results for input(s): PROT, ALBUMIN, AST, ALT, ALKPHOS, BILITOT in the last 168 hours. Hematology Recent Labs  Lab 03/16/20 1038  WBC 11.2*  RBC 4.34  HGB 11.2*  HCT 37.7  MCV 86.9  MCH 25.8*  MCHC 29.7*  RDW 14.6  PLT 263   BNP Recent Labs  Lab 03/16/20 1038  BNP 232.4*    DDimer No results for input(s): DDIMER in the last 168 hours.  Radiology/Studies:  DG Chest  Portable 1 View  Result Date: 03/16/2020 CLINICAL DATA:  59 year old female with shortness of breath. EXAM: PORTABLE CHEST 1 VIEW COMPARISON:  CTA chest 01/25/2019 and earlier. FINDINGS: Portable AP semi upright view at 1036 hours. Stable cardiomegaly and mediastinal contours. Increased indistinctness of pulmonary vasculature compared to September images. No pneumothorax. No consolidation. No pleural fluid is identified. Visualized tracheal air column is within normal limits. No acute osseous abnormality identified. Paucity of bowel gas in the upper abdomen. IMPRESSION: Cardiomegaly and low lung volumes with increased vascular congestion suspicious for mild or developing edema. Electronically Signed   By: Genevie Ann M.D.   On: 03/16/2020 10:41   ECHOCARDIOGRAM COMPLETE  Result Date: 03/17/2020    ECHOCARDIOGRAM REPORT   Patient Name:   [facility] A Hopfensperger Date of Exam: 03/17/2020 Medical Rec #:  [id number]       Height:        64.0 in Accession #:    [id number]      Weight:       350.5 lb Date of Birth:  Mar 12, 1961       BSA:          2.483 m Patient Age:    86 years        BP:           156/87 mmHg Patient Gender: F               HR:           87 bpm. Exam Location:  Inpatient Procedure: 2D Echo, Cardiac Doppler, Color Doppler and Intracardiac            Opacification Agent Indications:    Dyspnea  History:        Patient has prior history of Echocardiogram examinations, most                 recent 01/25/2019. CHF, Arrythmias:LBBB; Risk                 Factors:Hypertension and Morbid obesity. Resp. failure, CKD.  Sonographer:    Dustin Flock Referring Phys: [id number] MARGARET E PRAY  Sonographer Comments: Patient is morbidly obese, Technically challenging study due to limited acoustic windows and Technically difficult study due to poor echo windows. Image acquisition challenging due to patient body habitus. IMPRESSIONS  1. Abnormal mitral valve with probable severe mitral valve regurgitation, there may be multiple jets, and jet is eccentric. Suboptimally interrogated on this exam. There is evidence of LV dysfunction, and possible LV dilation. MR is best visualized in clip 51. Consider TEE if clinically indicated to better assess valve structure which is not well assessed on this exam.  2. Left ventricular ejection fraction, by estimation, is 40 to 45%. LV end diastolic dimension is 60 mm, but indexes to normal size based on BSA. The left ventricle has mildly decreased function. Left ventricular endocardial border not optimally defined  to evaluate regional wall motion despite use of Definity. Global hypokinesis. There is mild left ventricular hypertrophy. Left ventricular diastolic parameters are indeterminate.  3. Right ventricular systolic function is mildly reduced. The right ventricular size is mildly enlarged. There is mildly elevated pulmonary artery systolic pressure. The estimated right ventricular systolic pressure is 95.2  mmHg.  4. Left atrial size was mild to moderately dilated.  5. The mitral valve was not well visualized. Severe mitral valve regurgitation.  6. The aortic valve was not well visualized. Aortic valve regurgitation is not visualized. No aortic stenosis is present.  7.  The inferior vena cava is dilated in size with <50% respiratory variability, suggesting right atrial pressure of 15 mmHg. FINDINGS  Left Ventricle: Left ventricular ejection fraction, by estimation, is 40 to 45%. The left ventricle has mildly decreased function. Left ventricular endocardial border not optimally defined to evaluate regional wall motion. Definity contrast agent was given IV to delineate the left ventricular endocardial borders. The left ventricular internal cavity size was normal in size. There is mild left ventricular hypertrophy. Left ventricular diastolic parameters are indeterminate. Right Ventricle: The right ventricular size is mildly enlarged. Right vetricular wall thickness was not assessed. Right ventricular systolic function is mildly reduced. There is mildly elevated pulmonary artery systolic pressure. The tricuspid regurgitant velocity is 2.61 m/s, and with an assumed right atrial pressure of 15 mmHg, the estimated right ventricular systolic pressure is 16.1 mmHg. Left Atrium: Left atrial size was mild to moderately dilated. Right Atrium: Right atrial size was normal in size. Pericardium: There is no evidence of pericardial effusion. Mitral Valve: The mitral valve was not well visualized. Severe mitral valve regurgitation. Tricuspid Valve: The tricuspid valve is not well visualized. Tricuspid valve regurgitation is trivial. Aortic Valve: The aortic valve was not well visualized. Aortic valve regurgitation is not visualized. No aortic stenosis is present. Aortic valve peak gradient measures 15.2 mmHg. Pulmonic Valve: The pulmonic valve was not well visualized. Pulmonic valve regurgitation is trivial. Aorta: The aortic root is  normal in size and structure. Venous: The inferior vena cava is dilated in size with less than 50% respiratory variability, suggesting right atrial pressure of 15 mmHg. IAS/Shunts: No atrial level shunt detected by color flow Doppler.  LEFT VENTRICLE PLAX 2D LVIDd:         6.00 cm  Diastology LVIDs:         3.80 cm  LV e' medial:    13.60 cm/s LV PW:         1.20 cm  LV E/e' medial:  7.4 LV IVS:        0.80 cm  LV e' lateral:   7.07 cm/s LVOT diam:     2.20 cm  LV E/e' lateral: 14.1 LV SV:         79 LV SV Index:   32 LVOT Area:     3.80 cm  RIGHT VENTRICLE RV Basal diam:  3.20 cm RV S prime:     5.22 cm/s TAPSE (M-mode): 2.6 cm LEFT ATRIUM             Index       RIGHT ATRIUM           Index LA diam:        4.60 cm 1.85 cm/m  RA Area:     16.90 cm LA Vol (A2C):   75.7 ml 30.49 ml/m RA Volume:   45.40 ml  18.28 ml/m LA Vol (A4C):   94.6 ml 38.10 ml/m LA Biplane Vol: 88.4 ml 35.60 ml/m  AORTIC VALVE AV Area (Vmax): 2.32 cm AV Vmax:        195.00 cm/s AV Peak Grad:   15.2 mmHg LVOT Vmax:      119.00 cm/s LVOT Vmean:     83.300 cm/s LVOT VTI:       0.207 m  AORTA Ao Root diam: 2.60 cm MITRAL VALVE                TRICUSPID VALVE MV Area (PHT): 5.02 cm     TR Peak grad:  27.2 mmHg MV Decel Time: 151 msec     TR Vmax:        261.00 cm/s MV E velocity: 100.00 cm/s MV A velocity: 86.30 cm/s   SHUNTS MV E/A ratio:  1.16         Systemic VTI:  0.21 m                             Systemic Diam: 2.20 cm Cherlynn Kaiser MD Electronically signed by Cherlynn Kaiser MD Signature Date/Time: 03/17/2020/2:07:43 PM    Final      Assessment and Plan:   Acute on chronic systolic and diastolic heart failure Sever mitral regurgitation Morbid Obesity CKD Stage III HTN Social Determinants of Health:  Poor insight on disease process secondary to intellectual disability - has received 40 mg IV lasix and 80 mEq K - NYHA class II, Stage C, hypervolemi - Would admit and continue lasix 40 mg BID and escalate with goal net  negative 2-3L daily (holding on placing the order as patient need significant K repletion prior) - Strict I/Os, daily weights, and fluid restriction of < 2 L  - Replace electrolytes PRN and keep K>4 and Mg>2. - BID BMP, Mg until stable with aggressive diuresis - Holding  home beta blocker due to decompensation.  - would increase losartan to 25 mg daily (this would also help with the hypokalemia) - Continue spironolactone 25 mg daily  - when approaching euvolemia, would consider TEE if within goals of care (would need to discuss that with sister or POA)  Discussed with patient and nursing; reached out to primary team  For questions or updates, please contact [place] Please consult [email or web address] for contact info under    Signed,  Werner Lean, MD

## 2020-03-17 NOTE — Progress Notes (Signed)
  Mobility Specialist Criteria Algorithm Info.  Mobility Team:  HOB elevated: Activity: Transferred:  Bed to chair;Ambulated in room Range of motion: Active;All extremities Level of assistance: Minimal assist, patient does 75% or more Assistive device: Front wheel walker Minutes sitting in chair:  Minutes stood: 2 minutes Minutes ambulated: 2 minutes Distance ambulated (ft): 5 ft Mobility response: Tolerated well Bed Position: Chair ([place] chair)  Pt received in bed eager to get up and participate in mobility. Per patient, she lives in a multistory home with sister but rarely uses stiars. Pt has a RW that she also uses and reported needing assistance with ADL. She was able to get to the EOB supine/sit with minimal HHA. She required Mod A sit/stand and cues for hand placement/sequencing with RW. Pt stood from elevated surface and took step alongside bed to the chair. She is limited to short distances due to unsteadiness and weakness. Overall, pt tolerated well with no complaints and is now seated in recliner chair with all needs met.   03/17/2020 2:42 PM

## 2020-03-17 NOTE — Progress Notes (Signed)
Family Medicine Teaching Service Daily Progress Note Intern Pager: [phone]  Patient name: Lauren Wood Medical record number: [id number] Date of birth: 02-16-1961 Age: 59 y.o. Gender: female  Primary Care Provider: [name], MD Consultants: None Code Status: FULL  Pt Overview and Major Events to Date:  11/2: admitted  Assessment and Plan:  Lauren Wood is a 59 y.o. female presenting with worsening dyspnea. PMH is significant for HFmrEF, OSA, OHS, HTN, CKD stage 3, GERD, gout, borderline personality disorder and intellectual disability.  Acute Hypoxic Respiratory Failure 2/2 CHF exacerbation Patient states her breathing feels much better today. Stable on 2L with RR 18-20 and O2 sats in the 90s. She had 1858mL urine output yesterday after 40 IV Lasix. Underlying etiology of CHF exacerbation remains unknown- troponin flat x2, reports medication compliance and no dietary changes, no signs of infection.  -Echo pending -IV Lasix 40mg  this morning. Will reassess in pm to determine if additional diuresis needed -Continue home spironolactone 25mg  daily -Strict Is/Os -Daily weights -Supplemental O2, wean as tolerated, goal SpO2 >92% -Holding home carvedilol given CHF exacerbation, will resume on discharge  Hypokalemia K 2.9 this morning from 3.6 yesterday on admission. -Repleted with 75mEq Klor-Con -Will recheck BMP this afternoon  HTN Patient's BP mildly elevated at 156/87 this morning.  -Continue home prazosin and losartan -Lasix and spironolactone per above -Continue to monitor BP  CKD Stage 3 Chronic, stable. Patient's Cr at baseline- 1.25 this morning. -Avoid nephrotoxic meds  OSA/OHS Chronic, stable. -CPAP at night  Borderline Personality Disorder, Depression Stable. Followed by Psych outpatient. -Continue home buproprion, fluoxetine, mirtazapine, trazodone and cogentin -Held home Klonopin on admission. Per patient's sister, she takes this nightly. Will restart  tonight if she remains admitted.  Gout Chronic, stable. Not in acute flare. -Holding home allopurinol  FEN/GI: 2g Na diet, 2L fluid restriction PPx: Lovenox   Status is: Observation The patient remains OBS appropriate and will d/c before 2 midnights.  Dispo: The patient is from: Home              Anticipated d/c is to: Home              Anticipated d/c date is: 1 day              Patient currently is not medically stable to d/c.   Subjective:  Patient states she feels much better today. Feels her breathing is back to baseline. She has no other complaints. Denies fever, cough, chest pain, abdominal pain or other symptoms. When asked again, patient reports medication compliance and denies any changes in her diet.  Objective: Temp:  [97.3 F (36.3 C)-99.1 F (37.3 C)] 99.1 F (37.3 C) (11/03 0438) Pulse Rate:  [55-81] 81 (11/03 0438) Resp:  [18-42] 20 (11/03 0438) BP: (124-169)/(71-118) 146/83 (11/03 0438) SpO2:  [91 %-100 %] 91 % (11/03 0438) Weight:  [154.5 kg-159 kg] 159 kg (11/03 0438) Physical Exam: General: alert, well appearing, NAD Neck: no JVD Cardiovascular: RRR, normal S1/S2 without m/r/g Respiratory: speaking in full sentences, breathing comfortably on 2L. distant breath sounds, slight expiratory wheeze in L upper lung zone, otherwise no crackles or wheezes noted Abdomen: obese, +BS, soft, nontender Extremities: trace to 1+ nonpitting pedal edema, 2+ distal pulses  Laboratory: Recent Labs  Lab 03/16/20 1038  WBC 11.2*  HGB 11.2*  HCT 37.7  PLT 263   Recent Labs  Lab 03/16/20 1038 03/17/20 0229  NA 146* 144  K 3.6 2.9*  CL 103 101  CO2 30 32  BUN 20 16  CREATININE 1.36* 1.25*  CALCIUM 9.2 9.0  GLUCOSE 92 99   Troponin 35>30  Imaging/Diagnostic Tests: Echo pending   Alcus Dad, MD 03/17/2020, 6:04 AM PGY-1, [place] Intern pager: [phone], text pages welcome

## 2020-03-17 NOTE — Progress Notes (Signed)
  Echocardiogram 2D Echocardiogram has been performed.  Lauren Wood 03/17/2020, 9:33 AM

## 2020-03-18 DIAGNOSIS — F79 Unspecified intellectual disabilities: Secondary | ICD-10-CM | POA: Diagnosis present

## 2020-03-18 DIAGNOSIS — E875 Hyperkalemia: Secondary | ICD-10-CM | POA: Diagnosis not present

## 2020-03-18 DIAGNOSIS — E876 Hypokalemia: Secondary | ICD-10-CM | POA: Diagnosis present

## 2020-03-18 DIAGNOSIS — I5023 Acute on chronic systolic (congestive) heart failure: Secondary | ICD-10-CM | POA: Diagnosis not present

## 2020-03-18 DIAGNOSIS — I509 Heart failure, unspecified: Secondary | ICD-10-CM | POA: Diagnosis not present

## 2020-03-18 DIAGNOSIS — M79671 Pain in right foot: Secondary | ICD-10-CM | POA: Diagnosis not present

## 2020-03-18 DIAGNOSIS — Z20822 Contact with and (suspected) exposure to covid-19: Secondary | ICD-10-CM | POA: Diagnosis present

## 2020-03-18 DIAGNOSIS — F32A Depression, unspecified: Secondary | ICD-10-CM | POA: Diagnosis present

## 2020-03-18 DIAGNOSIS — F603 Borderline personality disorder: Secondary | ICD-10-CM | POA: Diagnosis present

## 2020-03-18 DIAGNOSIS — J9601 Acute respiratory failure with hypoxia: Secondary | ICD-10-CM | POA: Diagnosis present

## 2020-03-18 DIAGNOSIS — K219 Gastro-esophageal reflux disease without esophagitis: Secondary | ICD-10-CM | POA: Diagnosis present

## 2020-03-18 DIAGNOSIS — M1A9XX0 Chronic gout, unspecified, without tophus (tophi): Secondary | ICD-10-CM | POA: Diagnosis present

## 2020-03-18 DIAGNOSIS — I5043 Acute on chronic combined systolic (congestive) and diastolic (congestive) heart failure: Secondary | ICD-10-CM

## 2020-03-18 DIAGNOSIS — N179 Acute kidney failure, unspecified: Secondary | ICD-10-CM | POA: Diagnosis present

## 2020-03-18 DIAGNOSIS — N1831 Chronic kidney disease, stage 3a: Secondary | ICD-10-CM | POA: Diagnosis present

## 2020-03-18 DIAGNOSIS — R011 Cardiac murmur, unspecified: Secondary | ICD-10-CM | POA: Diagnosis present

## 2020-03-18 DIAGNOSIS — I472 Ventricular tachycardia: Secondary | ICD-10-CM | POA: Diagnosis not present

## 2020-03-18 DIAGNOSIS — Z6841 Body Mass Index (BMI) 40.0 and over, adult: Secondary | ICD-10-CM | POA: Diagnosis not present

## 2020-03-18 DIAGNOSIS — Z86010 Personal history of colonic polyps: Secondary | ICD-10-CM | POA: Diagnosis not present

## 2020-03-18 DIAGNOSIS — E662 Morbid (severe) obesity with alveolar hypoventilation: Secondary | ICD-10-CM | POA: Diagnosis present

## 2020-03-18 DIAGNOSIS — I447 Left bundle-branch block, unspecified: Secondary | ICD-10-CM | POA: Diagnosis present

## 2020-03-18 DIAGNOSIS — M79672 Pain in left foot: Secondary | ICD-10-CM | POA: Diagnosis not present

## 2020-03-18 DIAGNOSIS — Z888 Allergy status to other drugs, medicaments and biological substances status: Secondary | ICD-10-CM | POA: Diagnosis not present

## 2020-03-18 DIAGNOSIS — I340 Nonrheumatic mitral (valve) insufficiency: Secondary | ICD-10-CM | POA: Diagnosis present

## 2020-03-18 DIAGNOSIS — Z79899 Other long term (current) drug therapy: Secondary | ICD-10-CM | POA: Diagnosis not present

## 2020-03-18 DIAGNOSIS — R0902 Hypoxemia: Secondary | ICD-10-CM | POA: Diagnosis not present

## 2020-03-18 DIAGNOSIS — R0600 Dyspnea, unspecified: Secondary | ICD-10-CM | POA: Diagnosis present

## 2020-03-18 DIAGNOSIS — I130 Hypertensive heart and chronic kidney disease with heart failure and stage 1 through stage 4 chronic kidney disease, or unspecified chronic kidney disease: Secondary | ICD-10-CM | POA: Diagnosis present

## 2020-03-18 DIAGNOSIS — Z7982 Long term (current) use of aspirin: Secondary | ICD-10-CM | POA: Diagnosis not present

## 2020-03-18 LAB — BASIC METABOLIC PANEL
Anion gap: 10 (ref 5–15)
BUN: 12 mg/dL (ref 6–20)
CO2: 34 mmol/L — ABNORMAL HIGH (ref 22–32)
Calcium: 9.2 mg/dL (ref 8.9–10.3)
Chloride: 100 mmol/L (ref 98–111)
Creatinine, Ser: 1.2 mg/dL — ABNORMAL HIGH (ref 0.44–1.00)
GFR, Estimated: 52 mL/min — ABNORMAL LOW (ref >60)
Glucose, Bld: 117 mg/dL — ABNORMAL HIGH (ref 70–99)
Potassium: 3.5 mmol/L (ref 3.5–5.1)
Sodium: 144 mmol/L (ref 135–145)

## 2020-03-18 LAB — CBC
HCT: 36.8 % (ref 36.0–46.0)
Hemoglobin: 11.1 g/dL — ABNORMAL LOW (ref 12.0–15.0)
MCH: 26.4 pg (ref 26.0–34.0)
MCHC: 30.2 g/dL (ref 30.0–36.0)
MCV: 87.4 fL (ref 80.0–100.0)
Platelets: 255 10*3/uL (ref 150–400)
RBC: 4.21 MIL/uL (ref 3.87–5.11)
RDW: 14.9 % (ref 11.5–15.5)
WBC: 11.4 10*3/uL — ABNORMAL HIGH (ref 4.0–10.5)
nRBC: 0 % (ref 0.0–0.2)

## 2020-03-18 LAB — URINALYSIS, COMPLETE (UACMP) WITH MICROSCOPIC
Bilirubin Urine: NEGATIVE
Glucose, UA: NEGATIVE mg/dL
Ketones, ur: NEGATIVE mg/dL
Nitrite: NEGATIVE
Protein, ur: NEGATIVE mg/dL
Specific Gravity, Urine: 1.005 (ref 1.005–1.030)
pH: 7 (ref 5.0–8.0)

## 2020-03-18 LAB — MAGNESIUM: Magnesium: 1.7 mg/dL (ref 1.7–2.4)

## 2020-03-18 MED ORDER — ACETAMINOPHEN 325 MG PO TABS
650.0000 mg | ORAL_TABLET | Freq: Four times a day (QID) | ORAL | Status: DC | PRN
  Administered 2020-03-18 – 2020-03-22 (×5): 650 mg via ORAL
  Filled 2020-03-18 (×6): qty 2

## 2020-03-18 MED ORDER — POTASSIUM CHLORIDE CRYS ER 20 MEQ PO TBCR
40.0000 meq | EXTENDED_RELEASE_TABLET | Freq: Once | ORAL | Status: AC
  Administered 2020-03-18: 40 meq via ORAL
  Filled 2020-03-18: qty 2

## 2020-03-18 MED ORDER — CLONAZEPAM 0.125 MG PO TBDP
0.2500 mg | ORAL_TABLET | Freq: Every day | ORAL | Status: DC
  Administered 2020-03-18 – 2020-03-24 (×7): 0.25 mg via ORAL
  Filled 2020-03-18 (×7): qty 2

## 2020-03-18 MED ORDER — FUROSEMIDE 10 MG/ML IJ SOLN
40.0000 mg | Freq: Two times a day (BID) | INTRAMUSCULAR | Status: DC
  Administered 2020-03-18: 40 mg via INTRAVENOUS
  Filled 2020-03-18: qty 4

## 2020-03-18 MED ORDER — MAGNESIUM SULFATE 2 GM/50ML IV SOLN
2.0000 g | Freq: Once | INTRAVENOUS | Status: AC
  Administered 2020-03-18: 2 g via INTRAVENOUS
  Filled 2020-03-18: qty 50

## 2020-03-18 NOTE — Progress Notes (Addendum)
Family Medicine Teaching Service Daily Progress Note Intern Pager: [phone]  Patient name: Lauren Wood Medical record number: [id number] Date of birth: 09-Sep-1960 Age: 59 y.o. Gender: female  Primary Care Provider: [name], MD Consultants: Cardiology Code Status: FULL  Pt Overview and Major Events to Date:  11/2: admitted, started IV diuresis 11/3: echo showed severe mitral regurg, cardiology consulted  Assessment and Plan:  Lauren Wood a 59 y.o.femalepresenting with worsening dyspnea. PMH is significant forHFmrEF, OSA,OHS, HTN, CKD stage 3, GERD, gout, borderline personality disorder and intellectual disability.  Acute Hypoxic Respiratory Failure 2/2 CHF exacerbation   Severe Mitral Regurgitation Patient continues to feel well and denies breathing complaints. Stable on 2L with RR 18-20 and O2 sats 97-100%. Patient had 1.1L urine output yesterday and is net negative 2.4L since admission. Echo demonstrated severe mitral regurg, EF 40-45%, global hypokinesis of LV, and mildly impaired RV function. -Cardiology consulted, appreciate recommendations -Patient and sister would like to proceed with TEE although they have a few more questions to discuss with cardiology -IV Lasix 40mg  BID -Continue home spironolactone 25mg  daily -Daily BMP and Mag -Strict Is/Os -Daily weights -Wean supplemental O2 as tolerated, goal SpO2 >92% -Holding home carvedilol given acute exacerbation -2g Na and 2L fluid restricted diet  Hypokalemia Patient's potassium 2.9 yesterday. She was given 9mEq oral potassium x2 and her K is improved to 3.5 this morning. -Daily BMP and Mag -Will give 52mEq potassium with this morning's Lasix -Continue home spironolactone  HTN Patient's BP has been persistently elevated in the 944H-675F systolic since admission. -Losartan increased to 25mg  daily starting today -Continue home prazosin -Lasix and spironolactone as above -Continue to monitor BP  Mild  Leukocytosis WBC 11.4 this morning (11.2 on admission). No signs of infection clinically. -Daily CBC -Will monitor for signs of infection  CKD Stage 3 Chronic, stable. Patient's Cr 1.20 this morning, which is her baseline. -Avoid nephrotoxic meds  OSA   OHS Chronic, stable. -CPAP at night  Borderline Personality Disorder, Depression Stable. Followed by Psych outpatient. -Continue home buproprion, fluoxetine, mirtazapine, trazodone, cogentin, and clonazepam  Gout Chronic, stable. -Holding home allopurinol  FEN/GI: 2g Na diet, 2L fluid restriction PPx: Lovenox   Status is: Observation The patient will require care spanning > 2 midnights and should be moved to inpatient because: Ongoing diagnostic testing needed not appropriate for outpatient work up  Dispo: The patient is from: Home              Anticipated d/c is to: Home              Anticipated d/c date is: 3 days              Patient currently is not medically stable to d/c.   Subjective:  No acute events overnight. Patient feels "good" this morning. She denies complaints. States her breathing feels good and that she does not feel short of breath.  Objective: Temp:  [98.9 F (37.2 C)-99.8 F (37.7 C)] 99.4 F (37.4 C) (11/04 0316) Pulse Rate:  [87-92] 92 (11/04 0316) Resp:  [18-20] 18 (11/04 0316) BP: (143-156)/(78-87) 143/84 (11/04 0316) SpO2:  [97 %-100 %] 100 % (11/04 0316) Weight:  [157.7 kg] 157.7 kg (11/04 0500) Physical Exam: General: alert, well-appearing, NAD Neck: no JVD appreciated Cardiovascular: RRR, normal S1/S2 without m/r/g Respiratory: distant breath sounds, lungs CTAB Abdomen: obese, soft, nontender Extremities: 1+ nonpitting pedal edema  Laboratory: Recent Labs  Lab 03/16/20 1038 03/18/20 0301  WBC 11.2* 11.4*  HGB  11.2* 11.1*  HCT 37.7 36.8  PLT 263 255   Recent Labs  Lab 03/17/20 0229 03/17/20 1504 03/18/20 0301  NA 144 144 144  K 2.9* 2.9* 3.5  CL 101 99 100  CO2 32 32  34*  BUN 16 15 12   CREATININE 1.25* 1.22* 1.20*  CALCIUM 9.0 9.1 9.2  GLUCOSE 99 125* 117*    Imaging/Diagnostic Tests: ECHOCARDIOGRAM COMPLETE Result Date: 03/17/2020 Sonographer Comments: Patient is morbidly obese, Technically challenging study due to limited acoustic windows and Technically difficult study due to poor echo windows. Image acquisition challenging due to patient body habitus. IMPRESSIONS   1. Abnormal mitral valve with probable severe mitral valve regurgitation, there may be multiple jets, and jet is eccentric. Suboptimally interrogated on this exam. There is evidence of LV dysfunction, and possible LV dilation. MR is best visualized in clip 51. Consider TEE if clinically indicated to better assess valve structure which is not well assessed on this exam.   2. Left ventricular ejection fraction, by estimation, is 40 to 45%. LV end diastolic dimension is 60 mm, but indexes to normal size based on BSA. The left ventricle has mildly decreased function. Left ventricular endocardial border not optimally defined  to evaluate regional wall motion despite use of Definity. Global hypokinesis. There is mild left ventricular hypertrophy. Left ventricular diastolic parameters are indeterminate.   3. Right ventricular systolic function is mildly reduced. The right ventricular size is mildly enlarged. There is mildly elevated pulmonary artery systolic pressure. The estimated right ventricular systolic pressure is 93.8 mmHg.  4. Left atrial size was mild to moderately dilated.   5. The mitral valve was not well visualized. Severe mitral valve regurgitation.   6. The aortic valve was not well visualized. Aortic valve regurgitation is not visualized. No aortic stenosis is present.   7. The inferior vena cava is dilated in size with <50% respiratory variability, suggesting right atrial pressure of 15 mmHg.    Lauren Dad, MD 03/18/2020, 6:21 AM PGY-1, [place] Intern  pager: [phone], text pages welcome

## 2020-03-18 NOTE — Progress Notes (Signed)
Attempted to call sister multiple times on work and cell phone number listed on chart but was unsuccessful. MD call earlier today inquiring about visitor's policy due to pt having two sisters Lauren Wood, who is the pt and other sister's full time caregiver), wanting to know if we could get special permission for both sisters to come visit the pt but also so the MD could speak with Lauren Wood. This RN spoke with the director to get this cleared; Director approved. Attempted to communicate approval with Lauren Wood but was unsuccessful. Will continue to monitor.

## 2020-03-18 NOTE — Progress Notes (Addendum)
Progress Note  Patient Name: Lauren Wood Date of Encounter: 03/18/2020  [facility] Cardiologist: Minus Breeding, MD   Subjective   Breathing improving. No chest pain or palpitations.   Inpatient Medications    Scheduled Meds:  aspirin  81 mg Oral Daily   benztropine  0.25 mg Oral BID   buPROPion  150 mg Oral q morning - 10a   enoxaparin (LOVENOX) injection  80 mg Subcutaneous Daily   FLUoxetine  40 mg Oral q morning - 10a   furosemide  40 mg Intravenous Daily   losartan  25 mg Oral Daily   mirtazapine  30 mg Oral QHS   pantoprazole  40 mg Oral Daily   prazosin  1 mg Oral QHS   pregabalin  50 mg Oral Daily   risperidone  4 mg Oral BID   spironolactone  25 mg Oral Daily   traZODone  100 mg Oral QHS   Continuous Infusions:   PRN Meds:    Vital Signs    Vitals:   03/17/20 1946 03/18/20 0316 03/18/20 0500 03/18/20 0742  BP: (!) 147/81 (!) 143/84  (!) 150/89  Pulse: 91 92  93  Resp: 19 18  20   Temp: 99.7 F (37.6 C) 99.4 F (37.4 C)  99.4 F (37.4 C)  TempSrc: Oral Oral  Oral  SpO2: 97% 100%  (!) 84%  Weight:   (!) 157.7 kg   Height:        Intake/Output Summary (Last 24 hours) at 03/18/2020 0846 Last data filed at 03/18/2020 0400 Gross per 24 hour  Intake 240 ml  Output 1100 ml  Net -860 ml   Last 3 Weights 03/18/2020 03/17/2020 03/16/2020  Weight (lbs) 347 lb 9.6 oz 350 lb 8 oz 349 lb  Weight (kg) 157.67 kg 158.986 kg 158.305 kg      Telemetry    SR with PVCs - Personally Reviewed  ECG    No new tracing   Physical Exam   GEN: No acute distress.   Neck: JVD difficult to assess due to body habituate Cardiac: RRR, 3/6 holosystolic murmurs, rubs, or gallops.  Respiratory: course breath sound  GI: Soft, nontender, non-distended  MS: No edema; No deformity. Neuro:  Nonfocal  Psych: Normal affect   Labs    High Sensitivity Troponin:   Recent Labs  Lab 03/16/20 1611 03/16/20 1946  TROPONINIHS 35* 30*      Chemistry Recent Labs    Lab 03/17/20 0229 03/17/20 1504 03/18/20 0301  NA 144 144 144  K 2.9* 2.9* 3.5  CL 101 99 100  CO2 32 32 34*  GLUCOSE 99 125* 117*  BUN 16 15 12   CREATININE 1.25* 1.22* 1.20*  CALCIUM 9.0 9.1 9.2  GFRNONAA 49* 51* 52*  ANIONGAP 11 13 10      Hematology Recent Labs  Lab 03/16/20 1038 03/18/20 0301  WBC 11.2* 11.4*  RBC 4.34 4.21  HGB 11.2* 11.1*  HCT 37.7 36.8  MCV 86.9 87.4  MCH 25.8* 26.4  MCHC 29.7* 30.2  RDW 14.6 14.9  PLT 263 255    BNP Recent Labs  Lab 03/16/20 1038  BNP 232.4*     DDimer No results for input(s): DDIMER in the last 168 hours.   Radiology    DG Chest Portable 1 View  Result Date: 03/16/2020 CLINICAL DATA:  59 year old female with shortness of breath. EXAM: PORTABLE CHEST 1 VIEW COMPARISON:  CTA chest 01/25/2019 and earlier. FINDINGS: Portable AP semi upright view at 1036  hours. Stable cardiomegaly and mediastinal contours. Increased indistinctness of pulmonary vasculature compared to September images. No pneumothorax. No consolidation. No pleural fluid is identified. Visualized tracheal air column is within normal limits. No acute osseous abnormality identified. Paucity of bowel gas in the upper abdomen. IMPRESSION: Cardiomegaly and low lung volumes with increased vascular congestion suspicious for mild or developing edema. Electronically Signed   By: Genevie Ann M.D.   On: 03/16/2020 10:41   ECHOCARDIOGRAM COMPLETE  Result Date: 03/17/2020    ECHOCARDIOGRAM REPORT   Patient Name:   Lauren Wood Date of Exam: 03/17/2020 Medical Rec #:  [id number]       Height:       64.0 in Accession #:    [id number]      Weight:       350.5 lb Date of Birth:  [birth year]       BSA:          2.483 m Patient Age:    59 years        BP:           156/87 mmHg Patient Gender: F               HR:           87 bpm. Exam Location:  Inpatient Procedure: 2D Echo, Cardiac Doppler, Color Doppler and Intracardiac            Opacification Agent Indications:    Dyspnea  History:         Patient has prior history of Echocardiogram examinations, most                 recent 01/25/2019. CHF, Arrythmias:LBBB; Risk                 Factors:Hypertension and Morbid obesity. Resp. failure, CKD.  Sonographer:    Dustin Flock Referring Phys: [id number] MARGARET E PRAY  Sonographer Comments: Patient is morbidly obese, Technically challenging study due to limited acoustic windows and Technically difficult study due to poor echo windows. Image acquisition challenging due to patient body habitus. IMPRESSIONS  1. Abnormal mitral valve with probable severe mitral valve regurgitation, there may be multiple jets, and jet is eccentric. Suboptimally interrogated on this exam. There is evidence of LV dysfunction, and possible LV dilation. MR is best visualized in clip 51. Consider TEE if clinically indicated to better assess valve structure which is not well assessed on this exam.  2. Left ventricular ejection fraction, by estimation, is 40 to 45%. LV end diastolic dimension is 60 mm, but indexes to normal size based on BSA. The left ventricle has mildly decreased function. Left ventricular endocardial border not optimally defined  to evaluate regional wall motion despite use of Definity. Global hypokinesis. There is mild left ventricular hypertrophy. Left ventricular diastolic parameters are indeterminate.  3. Right ventricular systolic function is mildly reduced. The right ventricular size is mildly enlarged. There is mildly elevated pulmonary artery systolic pressure. The estimated right ventricular systolic pressure is 66.2 mmHg.  4. Left atrial size was mild to moderately dilated.  5. The mitral valve was not well visualized. Severe mitral valve regurgitation.  6. The aortic valve was not well visualized. Aortic valve regurgitation is not visualized. No aortic stenosis is present.  7. The inferior vena cava is dilated in size with <50% respiratory variability, suggesting right atrial pressure of 15 mmHg.  FINDINGS  Left Ventricle: Left ventricular ejection fraction, by estimation, is 40 to 45%. The left ventricle  has mildly decreased function. Left ventricular endocardial border not optimally defined to evaluate regional wall motion. Definity contrast agent was given IV to delineate the left ventricular endocardial borders. The left ventricular internal cavity size was normal in size. There is mild left ventricular hypertrophy. Left ventricular diastolic parameters are indeterminate. Right Ventricle: The right ventricular size is mildly enlarged. Right vetricular wall thickness was not assessed. Right ventricular systolic function is mildly reduced. There is mildly elevated pulmonary artery systolic pressure. The tricuspid regurgitant velocity is 2.61 m/s, and with an assumed right atrial pressure of 15 mmHg, the estimated right ventricular systolic pressure is 42.3 mmHg. Left Atrium: Left atrial size was mild to moderately dilated. Right Atrium: Right atrial size was normal in size. Pericardium: There is no evidence of pericardial effusion. Mitral Valve: The mitral valve was not well visualized. Severe mitral valve regurgitation. Tricuspid Valve: The tricuspid valve is not well visualized. Tricuspid valve regurgitation is trivial. Aortic Valve: The aortic valve was not well visualized. Aortic valve regurgitation is not visualized. No aortic stenosis is present. Aortic valve peak gradient measures 15.2 mmHg. Pulmonic Valve: The pulmonic valve was not well visualized. Pulmonic valve regurgitation is trivial. Aorta: The aortic root is normal in size and structure. Venous: The inferior vena cava is dilated in size with less than 50% respiratory variability, suggesting right atrial pressure of 15 mmHg. IAS/Shunts: No atrial level shunt detected by color flow Doppler.  LEFT VENTRICLE PLAX 2D LVIDd:         6.00 cm  Diastology LVIDs:         3.80 cm  LV e' medial:    13.60 cm/s LV PW:         1.20 cm  LV E/e' medial:  7.4  LV IVS:        0.80 cm  LV e' lateral:   7.07 cm/s LVOT diam:     2.20 cm  LV E/e' lateral: 14.1 LV SV:         79 LV SV Index:   32 LVOT Area:     3.80 cm  RIGHT VENTRICLE RV Basal diam:  3.20 cm RV S prime:     5.22 cm/s TAPSE (M-mode): 2.6 cm LEFT ATRIUM             Index       RIGHT ATRIUM           Index LA diam:        4.60 cm 1.85 cm/m  RA Area:     16.90 cm LA Vol (A2C):   75.7 ml 30.49 ml/m RA Volume:   45.40 ml  18.28 ml/m LA Vol (A4C):   94.6 ml 38.10 ml/m LA Biplane Vol: 88.4 ml 35.60 ml/m  AORTIC VALVE AV Area (Vmax): 2.32 cm AV Vmax:        195.00 cm/s AV Peak Grad:   15.2 mmHg LVOT Vmax:      119.00 cm/s LVOT Vmean:     83.300 cm/s LVOT VTI:       0.207 m  AORTA Ao Root diam: 2.60 cm MITRAL VALVE                TRICUSPID VALVE MV Area (PHT): 5.02 cm     TR Peak grad:   27.2 mmHg MV Decel Time: 151 msec     TR Vmax:        261.00 cm/s MV E velocity: 100.00 cm/s MV A velocity: 86.30 cm/s   SHUNTS MV  E/A ratio:  1.16         Systemic VTI:  0.21 m                             Systemic Diam: 2.20 cm Cherlynn Kaiser MD Electronically signed by Cherlynn Kaiser MD Signature Date/Time: 03/17/2020/2:07:43 PM    Final     Cardiac Studies   Echo 03/17/2020  1. Abnormal mitral valve with probable severe mitral valve regurgitation,  there may be multiple jets, and jet is eccentric. Suboptimally  interrogated on this exam. There is evidence of LV dysfunction, and  possible LV dilation. MR is best visualized in  clip 51. Consider TEE if clinically indicated to better assess valve  structure which is not well assessed on this exam.   2. Left ventricular ejection fraction, by estimation, is 40 to 45%. LV  end diastolic dimension is 60 mm, but indexes to normal size based on BSA.  The left ventricle has mildly decreased function. Left ventricular  endocardial border not optimally defined   to evaluate regional wall motion despite use of Definity. Global  hypokinesis. There is mild left ventricular  hypertrophy. Left ventricular  diastolic parameters are indeterminate.   3. Right ventricular systolic function is mildly reduced. The right  ventricular size is mildly enlarged. There is mildly elevated pulmonary  artery systolic pressure. The estimated right ventricular systolic  pressure is 96.2 mmHg.   4. Left atrial size was mild to moderately dilated.   5. The mitral valve was not well visualized. Severe mitral valve  regurgitation.   6. The aortic valve was not well visualized. Aortic valve regurgitation  is not visualized. No aortic stenosis is present.   7. The inferior vena cava is dilated in size with <50% respiratory  variability, suggesting right atrial pressure of 15 mmHg.   Patient Profile     59 y.o. female with a hx of chronic systolic and diastolic heart failure, HTN, CKD stage III, OSA on CPAP, borderline personality disorder, intellectual disability who is being seen  for the evaluation of dyspnea at the request of Dr. Thompson Grayer.  Assessment & Plan    1. Acute on chronic systolic and diastolic CHF - Echo this admission showed stable LVEF at 40-45%.  - BNP 232 - Diuresed -2.3L - Continue IV lasix, Losartan 25mg  qd and spironolactone 25mg  qd - Holding BB - Continue IV lasix 40mg  BID   2. Severe mitral regurgitation - Echo showed abnormal mitral valve with probable severe mitral valve regurgitation, there may be multiple jets, and jet is eccentric.  - ? TEE this admission, will need to discuss goals of care, family coming this afternoon. Patient wants to do TEE.   3. Hypokalemia - improving, K 3.5 today - Continue losartan and spiro - Give additional Kdur 40mg  x 1 today  - Recheck lab tomorrow   4. HTN - BP relatively stable   5. CKD III - Renal function stable   6. Hypomagnesemia - MG 1.7 today - Will give supplement  - Recheck lab tomorrow   For questions or updates, please contact [place] Please consult [email or web address] for contact info under          SignedLeanor Kail, PA  03/18/2020, 8:46 AM    Personally seen and examined. Agree with APP Provider above with the following comments: 59 yo F with HFmrEF with Severe Eccentric MR in the setting of LV dilation. Diuresing well; despite  spironolactone, still has some hypokalemia. Patient feels a little bit stiff but otherwise has no complaints. Exam notable for non holosystolic MR; query if prolapse is part of her mechanism for MR. Would benefit from aggressive diuresis and electrolyte repletion. Have missed sister twice; tentatively on the schedule for Monday for TEE.  Werner Lean, MD

## 2020-03-18 NOTE — Plan of Care (Signed)
Multiple attempts made to reach out to sister [name] Derflinger.  Unable to contact.  Need durable contact information for consent prior to TEE.  Will continue to reach out.  Werner Lean, MD

## 2020-03-19 ENCOUNTER — Inpatient Hospital Stay (HOSPITAL_COMMUNITY): Payer: Medicare Other | Admitting: Certified Registered Nurse Anesthetist

## 2020-03-19 ENCOUNTER — Encounter (HOSPITAL_COMMUNITY): Payer: Self-pay | Admitting: Family Medicine

## 2020-03-19 ENCOUNTER — Inpatient Hospital Stay (HOSPITAL_COMMUNITY): Payer: Medicare Other

## 2020-03-19 ENCOUNTER — Encounter (HOSPITAL_COMMUNITY)
Admission: EM | Disposition: A | Discharge status: Skilled Nursing Facility | Payer: Self-pay | Source: Home / Self Care | Attending: Family Medicine

## 2020-03-19 DIAGNOSIS — I5043 Acute on chronic combined systolic (congestive) and diastolic (congestive) heart failure: Secondary | ICD-10-CM | POA: Diagnosis not present

## 2020-03-19 DIAGNOSIS — I340 Nonrheumatic mitral (valve) insufficiency: Secondary | ICD-10-CM

## 2020-03-19 HISTORY — PX: TEE WITHOUT CARDIOVERSION: SHX5443

## 2020-03-19 LAB — BASIC METABOLIC PANEL
Anion gap: 15 (ref 5–15)
BUN: 16 mg/dL (ref 6–20)
CO2: 30 mmol/L (ref 22–32)
Calcium: 9.2 mg/dL (ref 8.9–10.3)
Chloride: 96 mmol/L — ABNORMAL LOW (ref 98–111)
Creatinine, Ser: 1.42 mg/dL — ABNORMAL HIGH (ref 0.44–1.00)
GFR, Estimated: 42 mL/min — ABNORMAL LOW (ref >60)
Glucose, Bld: 110 mg/dL — ABNORMAL HIGH (ref 70–99)
Potassium: 3.9 mmol/L (ref 3.5–5.1)
Sodium: 141 mmol/L (ref 135–145)

## 2020-03-19 LAB — MAGNESIUM: Magnesium: 2.1 mg/dL (ref 1.7–2.4)

## 2020-03-19 LAB — TROPONIN I (HIGH SENSITIVITY): Troponin I (High Sensitivity): 29 ng/L — ABNORMAL HIGH (ref <18)

## 2020-03-19 LAB — ECHO TEE
MV M vel: 5.03 m/s
MV Peak grad: 101.2 mmHg
Radius: 0.6 cm

## 2020-03-19 SURGERY — ECHOCARDIOGRAM, TRANSESOPHAGEAL
Anesthesia: Monitor Anesthesia Care

## 2020-03-19 MED ORDER — LACTATED RINGERS IV SOLN
INTRAVENOUS | Status: AC | PRN
  Administered 2020-03-19: 1000 mL via INTRAVENOUS

## 2020-03-19 MED ORDER — PROPOFOL 500 MG/50ML IV EMUL
INTRAVENOUS | Status: DC | PRN
  Administered 2020-03-19: 75 ug/kg/min via INTRAVENOUS

## 2020-03-19 MED ORDER — SODIUM CHLORIDE 0.9 % IV SOLN: INTRAVENOUS | Status: DC | PRN

## 2020-03-19 MED ORDER — FUROSEMIDE 10 MG/ML IJ SOLN
40.0000 mg | Freq: Every day | INTRAMUSCULAR | Status: DC
  Filled 2020-03-19: qty 4

## 2020-03-19 MED ORDER — POTASSIUM CHLORIDE CRYS ER 20 MEQ PO TBCR
40.0000 meq | EXTENDED_RELEASE_TABLET | Freq: Once | ORAL | Status: AC
  Administered 2020-03-19: 40 meq via ORAL
  Filled 2020-03-19: qty 2

## 2020-03-19 MED ORDER — BUTAMBEN-TETRACAINE-BENZOCAINE 2-2-14 % EX AERO
INHALATION_SPRAY | CUTANEOUS | Status: DC | PRN
  Administered 2020-03-19: 2 via TOPICAL

## 2020-03-19 MED ORDER — FUROSEMIDE 10 MG/ML IJ SOLN
40.0000 mg | Freq: Once | INTRAMUSCULAR | Status: AC
  Administered 2020-03-19: 40 mg via INTRAVENOUS
  Filled 2020-03-19: qty 4

## 2020-03-19 NOTE — Anesthesia Preprocedure Evaluation (Signed)
Anesthesia Evaluation  Patient identified by MRN, date of birth, ID band Patient awake    Reviewed: Allergy & Precautions, H&P , NPO status , Patient's Chart, lab work & pertinent test results  Airway Mallampati: II   Neck ROM: full    Dental   Pulmonary sleep apnea ,    breath sounds clear to auscultation       Cardiovascular hypertension, +CHF  + Valvular Problems/Murmurs MR  Rhythm:regular Rate:Normal     Neuro/Psych PSYCHIATRIC DISORDERS Schizophrenia    GI/Hepatic GERD  ,  Endo/Other  Morbid obesity  Renal/GU Renal InsufficiencyRenal disease     Musculoskeletal  (+) Arthritis ,   Abdominal   Peds  Hematology   Anesthesia Other Findings   Reproductive/Obstetrics                             Anesthesia Physical Anesthesia Plan  ASA: III  Anesthesia Plan: MAC   Post-op Pain Management:    Induction: Intravenous  PONV Risk Score and Plan: 2 and Propofol infusion and Treatment may vary due to age or medical condition  Airway Management Planned: Nasal Cannula  Additional Equipment:   Intra-op Plan:   Post-operative Plan:   Informed Consent: I have reviewed the patients History and Physical, chart, labs and discussed the procedure including the risks, benefits and alternatives for the proposed anesthesia with the patient or authorized representative who has indicated his/her understanding and acceptance.       Plan Discussed with: CRNA, Anesthesiologist and Surgeon  Anesthesia Plan Comments:         Anesthesia Quick Evaluation

## 2020-03-19 NOTE — Transfer of Care (Signed)
Immediate Anesthesia Transfer of Care Note  Patient: Lauren Wood  Procedure(s) Performed: TRANSESOPHAGEAL ECHOCARDIOGRAM (TEE) (N/A )  Patient Location: Endoscopy Unit  Anesthesia Type:MAC  Level of Consciousness: awake  Airway & Oxygen Therapy: Patient Spontanous Breathing and Patient connected to face mask oxygen  Post-op Assessment: Report given to RN and Post -op Vital signs reviewed and stable  Post vital signs: Reviewed and stable  Last Vitals:  Vitals Value Taken Time  BP 158/78 03/19/20 1147  Temp    Pulse 93 03/19/20 1151  Resp 44 03/19/20 1151  SpO2 97 % 03/19/20 1151  Vitals shown include unvalidated device data.  Last Pain:  Vitals:   03/19/20 1147  TempSrc: (P) Temporal  PainSc: 0-No pain      Patients Stated Pain Goal: 0 (63/89/37 3428)  Complications: No complications documented.

## 2020-03-19 NOTE — Progress Notes (Addendum)
1100: Pt off floor for TEE. Consent signed at bedside by sister. Will await return to the floor.   1220: Pt returned to the floor yellow MEWS score. Will address.

## 2020-03-19 NOTE — Progress Notes (Signed)
FPTS Interim Progress Note  S: Called to bedside because patient was having increased work of breathing with shallow respirations and tachypnea. Also informed by telemetry tech that patient had some ST elevations on her tele. Patient is a poor historian 2/2 intellectual disability, but does feel she's breathing fast. She denies shortness or breath or chest pain.  Patient also complaining of R foot pain. Patient unable to provide more detail regarding her foot pain.  O: BP (!) 156/71 (BP Location: Right Arm)   Pulse 96   Temp 98.9 F (37.2 C) (Oral)   Resp 20   Ht 5\' 4"  (1.626 m)   Wt (!) 155.4 kg   LMP 05/08/2013   SpO2 98%   BMI 58.81 kg/m   Gen: alert, well-appearing, NAD CV: RRR, normal S1/S2 without m/r/g Resp: shallow breaths, mildly tachypneic to 20s, currently on 3L , no respiratory distress Ext: Tenderness at base of 1st metatarsal on plantar aspect. Nonpainful ROM of R foot and great toe.   A/P: Increased work of breathing, ?ST elevations -STAT EKG obtained, appears unchanged from prior -STAT troponin ordered -Will obtain CXR -Will alert cardiology with update  R Foot Pain -Will obtain R foot x-ray   Alcus Dad, MD 03/19/2020, 3:03 PM PGY-1, [facility] pager [phone]

## 2020-03-19 NOTE — Progress Notes (Signed)
Progress Note  Patient Name: Lauren Wood Date of Encounter: 03/19/2020  [facility] Cardiologist: Minus Breeding, MD   Subjective   Sister at bedside. No shortness of breath.  BP has increased.  Inpatient Medications    Scheduled Meds:  aspirin  81 mg Oral Daily   benztropine  0.25 mg Oral BID   buPROPion  150 mg Oral q morning - 10a   clonazepam  0.25 mg Oral QHS   enoxaparin (LOVENOX) injection  80 mg Subcutaneous Daily   FLUoxetine  40 mg Oral q morning - 10a   furosemide  40 mg Intravenous BID   losartan  25 mg Oral Daily   mirtazapine  30 mg Oral QHS   pantoprazole  40 mg Oral Daily   prazosin  1 mg Oral QHS   pregabalin  50 mg Oral Daily   risperidone  4 mg Oral BID   spironolactone  25 mg Oral Daily   traZODone  100 mg Oral QHS   Vital Signs    Vitals:   03/18/20 0742 03/18/20 1143 03/18/20 1957 03/19/20 0338  BP: (!) 150/89 (!) 149/86 (!) 141/75 (!) 134/93  Pulse: 93 89 95 99  Resp: 20 16 18 18   Temp: 99.4 F (37.4 C) 99.1 F (37.3 C) 100.3 F (37.9 C) 99.8 F (37.7 C)  TempSrc: Oral Oral Oral Oral  SpO2: (!) 84% 95% 95% 94%  Weight:    (!) 155.4 kg  Height:        Intake/Output Summary (Last 24 hours) at 03/19/2020 1006 Last data filed at 03/19/2020 0838 Gross per 24 hour  Intake 1013 ml  Output 1550 ml  Net -537 ml   Last 3 Weights 03/19/2020 03/18/2020 03/17/2020  Weight (lbs) 342 lb 9.5 oz 347 lb 9.6 oz 350 lb 8 oz  Weight (kg) 155.4 kg 157.67 kg 158.986 kg      Telemetry    Sinus rhythm with PVCs stable IVCD from admission - Personally Reviewed  ECG    No new tracing   Physical Exam   GEN: No acute distress.   Neck: JVD difficult to assess due to body habituate Cardiac: RRR, 3/6 late systolic murmurs, rubs, or gallops.  Respiratory: course breath sound  GI: Soft, nontender, non-distended  MS: No edema; No deformity. Neuro:  Nonfocal  Psych: Normal affect   Labs    High Sensitivity Troponin:   Recent Labs  Lab  03/16/20 1611 03/16/20 1946  TROPONINIHS 35* 30*      Chemistry Recent Labs  Lab 03/17/20 1504 03/18/20 0301 03/19/20 0158  NA 144 144 141  K 2.9* 3.5 3.9  CL 99 100 96*  CO2 32 34* 30  GLUCOSE 125* 117* 110*  BUN 15 12 16   CREATININE 1.22* 1.20* 1.42*  CALCIUM 9.1 9.2 9.2  GFRNONAA 51* 52* 42*  ANIONGAP 13 10 15      Hematology Recent Labs  Lab 03/16/20 1038 03/18/20 0301  WBC 11.2* 11.4*  RBC 4.34 4.21  HGB 11.2* 11.1*  HCT 37.7 36.8  MCV 86.9 87.4  MCH 25.8* 26.4  MCHC 29.7* 30.2  RDW 14.6 14.9  PLT 263 255    BNP Recent Labs  Lab 03/16/20 1038  BNP 232.4*    DDimer No results for input(s): DDIMER in the last 168 hours.   Radiology    No results found.  Cardiac Studies   Echo 03/17/2020  1. Abnormal mitral valve with probable severe mitral valve regurgitation,  there may be multiple  jets, and jet is eccentric. Suboptimally  interrogated on this exam. There is evidence of LV dysfunction, and  possible LV dilation. MR is best visualized in  clip 51. Consider TEE if clinically indicated to better assess valve  structure which is not well assessed on this exam.   2. Left ventricular ejection fraction, by estimation, is 40 to 45%. LV  end diastolic dimension is 60 mm, but indexes to normal size based on BSA.  The left ventricle has mildly decreased function. Left ventricular  endocardial border not optimally defined   to evaluate regional wall motion despite use of Definity. Global  hypokinesis. There is mild left ventricular hypertrophy. Left ventricular  diastolic parameters are indeterminate.   3. Right ventricular systolic function is mildly reduced. The right  ventricular size is mildly enlarged. There is mildly elevated pulmonary  artery systolic pressure. The estimated right ventricular systolic  pressure is 41.3 mmHg.   4. Left atrial size was mild to moderately dilated.   5. The mitral valve was not well visualized. Severe mitral valve    regurgitation.   6. The aortic valve was not well visualized. Aortic valve regurgitation  is not visualized. No aortic stenosis is present.   7. The inferior vena cava is dilated in size with <50% respiratory  variability, suggesting right atrial pressure of 15 mmHg.   Patient Profile     59 y.o. female with a hx of chronic systolic and diastolic heart failure, HTN, CKD stage III, OSA on CPAP, borderline personality disorder, intellectual disability who is being seen  for the evaluation of dyspnea at the request of Dr. Thompson Grayer.  Assessment & Plan    1. Acute on chronic systolic and diastolic CHF - Echo this admission showed stable LVEF at 40-45%.  - BNP 232 - Diuresis has slowed - Losartan 25mg  qd and spironolactone 25mg  qd - Holding BB - Planned for lasix 40 mg PO tomorrow  2. Severe mitral regurgitation - Echo showed abnormal mitral valve with probable severe mitral valve regurgitation, there may be multiple jets, and jet is eccentric.  - Planned for TEE today  3. Hypokalemia - improving, K 3.9 today  4. HTN - If persistently hypertensive, consider hydralazine 10 mg BID and can increase as needed  5. AKI on CKD III - Backing of diuresis today  6. Hypomagnesemia - MG 2.1 today - Will give supplement  - Recheck lab tomorrow   For questions or updates, please contact [place] Please consult [email or web address] for contact info under     Signed, Werner Lean, MD  03/19/2020, 10:06 AM        [facility] has been requested to perform a transesophageal echocardiogram on Lauren Wood for mitral regurgitation.  After careful review of history and examination, the risks and benefits of transesophageal echocardiogram have been explained including risks of esophageal damage, perforation (1:10,000 risk), bleeding, pharyngeal hematoma as well as other potential complications associated with conscious sedation including aspiration, arrhythmia, respiratory failure and  death. Alternatives to treatment were discussed, questions were answered. Patient's Sister Lauren Wood is willing to proceed.   Werner Lean, MD  03/19/2020 10:13 AM

## 2020-03-19 NOTE — Progress Notes (Addendum)
Monitor Tech called stating that pt was having more frequent PVC's along with some new ST elevation; Went to check on pt; Found pt tachypneic, check VS; BP elevated and tachycardic; pt stated she was not having chest or epigastric pain, SOB was about the same but NO MORE than what she has been experiencing since admission. Rounding MD walked in when this occurred; MD requested STAT EKG; results found in chart; added IV lasix and PO Potassium replacement at this time. Pt currently resting in the bed with eyes closed. Will recheck VS. Will continue to monitor.

## 2020-03-19 NOTE — Progress Notes (Signed)
  Echocardiogram Echocardiogram Transesophageal has been performed.  Bobbye Charleston 03/19/2020, 11:51 AM

## 2020-03-19 NOTE — Care Plan (Signed)
Discussed with primary team:  Best number for Sister Takari Duncombe) is [phone]

## 2020-03-19 NOTE — TOC Initial Note (Signed)
Transition of Care [facility]) - Initial/Assessment Note    Patient Details  Name: Lauren Wood MRN: [id number] Date of Birth: 01-24-1961  [facility] CM/SW Contact:    Loreta Ave, [place] Phone Number: 03/19/2020, 5:27 PM  Clinical Narrative:                 CSW reached out to pt's sister Arlene to speak about SNF recommendation, vm picked up but CSW couldn't leave a vm due to vm being full. CSW will attempt to try again.         Patient Goals and CMS Choice        Expected Discharge Plan and Services                                                Prior Living Arrangements/Services                       Activities of Daily Living      Permission Sought/Granted                  Emotional Assessment              Admission diagnosis:  Hypoxia [R09.02] Acute exacerbation of CHF (congestive heart failure) ([place]) [I50.9] Acute on chronic congestive heart failure, unspecified heart failure type ([place]) [I50.9] Heart failure ([place]) [I50.9] Patient Active Problem List   Diagnosis Date Noted   Nonrheumatic mitral valve regurgitation    Heart failure ([place]) 03/18/2020   Acute respiratory failure with hypoxia ([place]) 03/16/2020   Acute exacerbation of CHF (congestive heart failure) ([place]) 03/16/2020   Hypoxia    Pulmonary nodules 01/26/2019   CHF exacerbation ([place]) 01/25/2019   Acute on chronic diastolic HF (heart failure) ([place]) 07/01/2018   Obesity hypoventilation syndrome (HCC)    Intellectual disability    CHF (congestive heart failure) ([place]) 05/10/2018   Eustachian tube dysfunction, bilateral 12/07/2017   Otorrhea of left ear 11/26/2017   Diverticulosis of colon 11/22/2016   Restless leg syndrome 09/05/2016   Myringotomy tube status 02/11/2016   Impacted cerumen, bilateral 07/07/2015   Mixed conductive and sensorineural hearing loss of right ear with restricted hearing of left ear 07/07/2015   Perforation  of right tympanic membrane 07/07/2015   Urinary incontinence 12/24/2014   Gout of foot 09/26/2013   Onychomycosis 09/25/2013   Personal history of colonic polyps 06/24/2013   Health care maintenance 02/11/2013   Hearing loss in left ear 02/11/2013   OSA (obstructive sleep apnea) 12/11/2012   Borderline personality disorder ([place]) 05/31/2011   CKD (chronic kidney disease) stage 3, GFR 30-59 ml/min ([place]) 02/06/2011   Psychosis ([place]) 12/20/2009   Morbid obesity ([place]) 08/02/2009   MENTAL RETARDATION 08/02/2009   Essential hypertension, benign 08/02/2009   GERD 08/02/2009   OSTEOARTHRITIS, MULTIPLE JOINTS 08/02/2009   SLEEP DISORDER 08/02/2009   Dyssomnia 08/02/2009   PCP:  [name], MD Pharmacy:   [facility] [place] - [address] [place] [address] Phone: [phone] Fax: [phone]     Social Determinants of Health (SDOH) Interventions    Readmission Risk Interventions No flowsheet data found.

## 2020-03-19 NOTE — Evaluation (Signed)
Physical Therapy Evaluation Patient Details Name: SOLSTICE LASTINGER MRN: [id number] DOB: 1961/03/28 Today's Date: 03/19/2020   History of Present Illness  59 y.o. female presenting with worsening dyspnea. PMH is significant for HFmrEF, OSA, OHS, HTN, CKD stage 3, GERD, gout, and intellectual disability    Clinical Impression  PTA, patient lives with sister and utilized RW for mobility. Per sister, patient at times required assistance for transfers due to weakness. Patient at baseline cognition with history of intellectual disability. Patient required modA for bed mobility with assist for R LE advancement and trunk elevation. MaxA+2 for sit to stand with Rw and from elevated surface, cues required for hand placement on bed to assist with standing. Patient benefits from counting prior to standing. Patient required minA for stand pivot transfer with RW with multimodal cues for RW management and upright posture. Patient presents with limitations in endurance, strength, balance, and functional mobility. Patient will benefit from skilled PT services during acute stay to address listed deficits. Recommend SNF following discharge to maximize functional mobility.     Follow Up Recommendations SNF;Supervision/Assistance - 24 hour    Equipment Recommendations   (defer to post acute rehab)    Recommendations for Other Services       Precautions / Restrictions Precautions Precautions: Fall Restrictions Weight Bearing Restrictions: No      Mobility  Bed Mobility Overal bed mobility: Needs Assistance Bed Mobility: Supine to Sit     Supine to sit: Mod assist    General bed mobility comments: requires constant cueing and assistance for R LE advancement and trunk elevation    Transfers Overall transfer level: Needs assistance Equipment used: Rolling [place] (2 wheeled) Transfers: Sit to/from [employer] Transfers Sit to Stand: Max assist;+2 safety/equipment;+2 physical assistance;From  elevated surface Stand pivot transfers: Min assist       General transfer comment: maxA+2 for sit to stand with cues required for hand placement and upright posture upon standing; minA for stand pivot for RW management and cueing for hand placement and for turning and safety prior to sitting   Ambulation/Gait                Stairs            Wheelchair Mobility    Modified Rankin (Stroke Patients Only)       Balance Overall balance assessment: Needs assistance Sitting-balance support: Bilateral upper extremity supported;Feet supported Sitting balance-Leahy Scale: Poor     Standing balance support: Bilateral upper extremity supported;During functional activity Standing balance-Leahy Scale: Poor Standing balance comment: minA during stand pivot transfer for safety                              Pertinent Vitals/Pain Pain Assessment: Faces Faces Pain Scale: Hurts a little bit Pain Location: legs Pain Descriptors / Indicators: Aching Pain Intervention(s): Limited activity within patient's tolerance;Monitored during session;Repositioned    Home Living Family/patient expects to be discharged to:: Private residence Living Arrangements: Other relatives Available Help at Discharge: Family Type of Home: House Home Access: Level entry     Home Layout: Two level (lives on main level) Home Equipment: Environmental consultant - 2 wheels;Cane - single point;Shower seat Additional Comments: Lives with sister    Prior Function Level of Independence: Needs assistance   Gait / Transfers Assistance Needed: States she uses her [place] when she is feeling "not good"  ADL's / Homemaking Assistance Needed: states she can bathe and dress herself.  sister performs cooking, cleaning, etc.   Comments: pt states she only uses O2 at night (CPAP); states she works at [employer]   Dominant Hand: Right    Extremity/Trunk Assessment   Upper Extremity  Assessment Upper Extremity Assessment: Defer to OT evaluation     Lower Extremity Assessment Lower Extremity Assessment: Generalized weakness    Cervical / Trunk Assessment Cervical / Trunk Assessment: Normal  Communication   Communication: HOH  Cognition Arousal/Alertness: Awake/alert Behavior During Therapy: WFL for tasks assessed/performed Overall Cognitive Status: History of cognitive impairments - at baseline                                        General Comments General comments (skin integrity, edema, etc.): Patient on 2L O2 [place]; spO2 on arrival 95%, with activity spO2 maintained at 95% however patient had DOE 3/4    Exercises     Assessment/Plan    PT Assessment Patient needs continued PT services  PT Problem List Decreased strength;Decreased activity tolerance;Decreased balance;Decreased mobility;Decreased safety awareness;Cardiopulmonary status limiting activity;Pain       PT Treatment Interventions DME instruction;Gait training;Functional mobility training;Therapeutic activities;Therapeutic exercise;Balance training;Patient/family education    PT Goals (Current goals can be found in the Care Plan section)  Acute Rehab PT Goals Patient Stated Goal: "i want to get stronger and better and go back to work" PT Goal Formulation: With patient Time For Goal Achievement: 04/02/20 Potential to Achieve Goals: Fair    Frequency Min 2X/week   Barriers to discharge        Co-evaluation               AM-PAC PT "6 Clicks" Mobility  Outcome Measure Help needed turning from your back to your side while in a flat bed without using bedrails?: A Lot Help needed moving from lying on your back to sitting on the side of a flat bed without using bedrails?: A Lot Help needed moving to and from a bed to a chair (including a wheelchair)?: A Little Help needed standing up from a chair using your arms (e.g., wheelchair or bedside chair)?: A Lot Help needed to  walk in hospital room?: A Lot Help needed climbing 3-5 steps with a railing? : Total 6 Click Score: 12    End of Session Equipment Utilized During Treatment: Gait belt;Oxygen Activity Tolerance: Patient limited by fatigue;Patient limited by pain Patient left: in chair;with call bell/phone within reach;with family/visitor present Nurse Communication: Mobility status PT Visit Diagnosis: Unsteadiness on feet (R26.81);Other abnormalities of gait and mobility (R26.89);Muscle weakness (generalized) (M62.81);Pain Pain - Right/Left:  (bilateral) Pain - part of body: Ankle and joints of foot;Leg    Time: 5277-8242 PT Time Calculation (min) (ACUTE ONLY): 20 min   Charges:   PT Evaluation $PT Eval Moderate Complexity: 1 Mod          Perrin Maltese, PT, DPT Acute Rehabilitation Services Pager [phone] Office [phone]   Melene Plan Allred 03/19/2020, 10:48 AM

## 2020-03-19 NOTE — TOC Progression Note (Signed)
Transition of Care [facility]) - Progression Note    Patient Details  Name: Lauren Wood MRN: [id number] Date of Birth: May 28, 1960  [facility] CM/SW Contact  Zenon Mayo, RN Phone Number: 03/19/2020, 9:00 AM  Clinical Narrative:    NCM spoke with patient at bedside, she wears hearing aides,  She states she lives with her sister Arelene, she uses a Programmer, multimedia, also has home oxygen which she wears at nighttime.  NCM asked patient about [facility] services if she needs , offered choice and she states she does not have a preference.   Occupational therapy has done eval and they are rec SNF, the physical therapy eval has not been done yet.NCM went back to speak with patient to inform her of the rec from Occupational therapy, asked if she was in agreement with going to a SNF she states they if that is the rec we would need to talk to her sister [name].        Expected Discharge Plan and Services                                                 Social Determinants of Health (SDOH) Interventions    Readmission Risk Interventions No flowsheet data found.

## 2020-03-19 NOTE — Progress Notes (Signed)
Family Medicine Teaching Service Daily Progress Note Intern Pager: [phone]  Patient name: Lauren Wood Medical record number: [id number] Date of birth: February 03, 1961 Age: 59 y.o. Gender: female  Primary Care Provider: [name], MD Consultants: Cardiology Code Status: FULL  Pt Overview and Major Events to Date:   11/2: admitted, started IV diuresis 11/3: echo showed severe mitral regurg, cardiology consulted  Assessment and Plan:  Lauren Wood a 60 y.o.femalepresenting with worsening dyspnea. PMH is significant forHFmrEF, OSA,OHS, HTN, CKD stage 3, GERD, gout,borderline personality disorderand intellectual disability.  Acute Hypoxic Respiratory Failure 2/2 CHF exacerbation   Severe Mitral Regurgitation Stable on 2L [place]. Denies breathing complaints. Net negative 3.5L since admission. -Cardiology following, appreciate recommendations -Plan for TEE today -Lasix 40mg  PO tomorrow -Continue home spironolactone -Daily BMP -Strict Is/Os -Daily weights -2g Na and 2L fluid restricted diet -Consider restarting home carvedilol -Wean supplemental O2 as tolerated, goal SpO2 >92% -PT/OT  HTN BP slightly elevated 735H systolic. -Continue Losartan 25mg  daily -Continue home prazosin -Lasix and spironolactone as above -Will continue to monitor BP  CKD Stage 3 Cr 1.42 today from 1.20 yesterday.  -Daily BMP -Plan to decrease diuresis: PO Lasix tomorrow  Hypokalemia, resolved K 3.9 this morning. -Replete as needed  OSA   OHS Chronic, stable. -CPAP at night  Borderline Personality Disorder, Depression Stable. Followed by Psych outpatient. -Continue home buproprion, fluoxetine, mirtazapine, trazodone, cogentin, and clonazepam  Gout Chronic, stable. -Holding home allopurinol  FEN/GI: 2g Na diet, 2L fluid restriction PPx: Lovenox   Status is: Inpatient Remains inpatient appropriate because:Ongoing diagnostic testing needed not appropriate for outpatient work  up   Dispo: The patient is from: Home              Anticipated d/c is to: Home              Anticipated d/c date is: 2 days              Patient currently is not medically stable to d/c.   Subjective:  Patient feels well overall. Denies breathing complaints. She does report some R knee and bilateral foot pain when she attempts to get up and walk.   Objective: Temp:  [99.1 F (37.3 C)-100.3 F (37.9 C)] 99.8 F (37.7 C) (11/05 0338) Pulse Rate:  [89-99] 99 (11/05 0338) Resp:  [16-20] 18 (11/05 0338) BP: (134-150)/(75-93) 134/93 (11/05 0338) SpO2:  [84 %-95 %] 94 % (11/05 0338) Weight:  [155.4 kg] 155.4 kg (11/05 0338) Physical Exam: General: alert, well-appearing, NAD Cardiovascular: RRR, normal S1/S2 Respiratory: distant breath sounds, lungs CTAB Abdomen: obese, soft, nontender Extremities: 1+ nonpitting pedal edema. R knee: nontender to palpation, full ROM Bilateral feet tender to palpation throughout forefoot on both plantar and dorsal aspect.  Laboratory: Recent Labs  Lab 03/16/20 1038 03/18/20 0301  WBC 11.2* 11.4*  HGB 11.2* 11.1*  HCT 37.7 36.8  PLT 263 255   Recent Labs  Lab 03/17/20 1504 03/18/20 0301 03/19/20 0158  NA 144 144 141  K 2.9* 3.5 3.9  CL 99 100 96*  CO2 32 34* 30  BUN 15 12 16   CREATININE 1.22* 1.20* 1.42*  CALCIUM 9.1 9.2 9.2  GLUCOSE 125* 117* 110*    Imaging/Diagnostic Tests: No new imaging/diagnostic tests in last 24h.   Lauren Dad, MD 03/19/2020, 6:18 AM PGY-1, [place] Intern pager: [phone], text pages welcome

## 2020-03-19 NOTE — Interval H&P Note (Signed)
History and Physical Interval Note:  03/19/2020 10:45 AM  Lauren Wood  has presented today for surgery, with the diagnosis of Severe MR.  The various methods of treatment have been discussed with the patient and family. After consideration of risks, benefits and other options for treatment, the patient has consented to  Procedure(s): TRANSESOPHAGEAL ECHOCARDIOGRAM (TEE) (N/A) as a surgical intervention.  The patient's history has been reviewed, patient examined, no change in status, stable for surgery.  I have reviewed the patient's chart and labs.  Questions were answered to the patient's satisfaction.     Kirk Ruths

## 2020-03-19 NOTE — Evaluation (Addendum)
Occupational Therapy Evaluation Patient Details Name: NEEMA BARREIRA MRN: [id number] DOB: 1960-05-21 Today's Date: 03/19/2020    History of Present Illness 59 y.o. female presenting with worsening dyspnea. PMH is significant for HFmrEF, OSA, OHS, HTN, CKD stage 3, GERD, gout, and intellectual disability   Clinical Impression   Patient admitted for the above diagnosis.  Baseline cognition, generalized weakness, discomfort to both legs, presumed poor stand balance are impacting her independence in the acute setting.  Unclear what her true prior level at home was.  She states she could walk with a RW, bathe and dress herself, and her sister takes care of everything else.  Currently she is unable to stand in order to pivot.  OT will follow in the acute setting.  SNF is recommended.      Follow Up Recommendations  SNF;Supervision/Assistance - 24 hour    Equipment Recommendations  Wheelchair (measurements OT);Wheelchair cushion (measurements OT)    Recommendations for Other Services       Precautions / Restrictions Precautions Precautions: Fall Restrictions Weight Bearing Restrictions: No      Mobility Bed Mobility Overal bed mobility: Needs Assistance Bed Mobility: Supine to Sit;Sit to Supine     Supine to sit: Mod assist Sit to supine: Mod assist        Transfers                 General transfer comment: unable to stand this date    Balance Overall balance assessment: Needs assistance Sitting-balance support: Bilateral upper extremity supported Sitting balance-Leahy Scale: Fair                                     ADL either performed or assessed with clinical judgement   ADL Overall ADL's : Needs assistance/impaired     Grooming: Wash/dry hands;Wash/dry face;Set up;Sitting               Lower Body Dressing: Total assistance;Sitting/lateral leans Lower Body Dressing Details (indicate cue type and reason): unable to reach feet                       Patient Visual Report: No change from baseline                  Pertinent Vitals/Pain Pain Assessment: Faces Faces Pain Scale: Hurts a little bit Pain Location: legs Pain Descriptors / Indicators: Aching Pain Intervention(s): Monitored during session     Hand Dominance Right   Extremity/Trunk Assessment Upper Extremity Assessment Upper Extremity Assessment: Generalized weakness;RUE deficits/detail RUE Deficits / Details: decreased B shoulder AROM - R>L RUE Sensation: WNL RUE Coordination: WNL   Lower Extremity Assessment Lower Extremity Assessment: Defer to PT evaluation   Cervical / Trunk Assessment Cervical / Trunk Assessment: Normal   Communication Communication Communication: HOH   Cognition Arousal/Alertness: Awake/alert Behavior During Therapy: WFL for tasks assessed/performed Overall Cognitive Status: History of cognitive impairments - at baseline                                                      Home Living Family/patient expects to be discharged to:: Private residence Living Arrangements: Other relatives Available Help at Discharge: Family Type of Home: Apartment Home Access: Level entry  Home Layout: One level     Bathroom Shower/Tub: International aid/development worker Accessibility: Yes How Accessible: Accessible via walker Home Equipment: Hood - 2 wheels;Shower seat   Additional Comments: Lives with sister      Prior Functioning/Environment Level of Independence: Needs assistance  Gait / Transfers Assistance Needed: States she uses her 2WRW when she is feeling "not good" ADL's / Homemaking Assistance Needed: states she can bathe and dress himself.  sister completes meal prep, home management, community mobility, IADL, and med management.            OT Problem List: Decreased strength;Decreased range of motion;Impaired balance (sitting and/or standing);Obesity      OT  Treatment/Interventions: Self-care/ADL training;Therapeutic exercise;Therapeutic activities;Balance training;Visual/perceptual remediation/compensation    OT Goals(Current goals can be found in the care plan section) Acute Rehab OT Goals Patient Stated Goal: Can I go back to work OT Goal Formulation: With patient Time For Goal Achievement: 04/09/20 Potential to Achieve Goals: Good ADL Goals Pt Will Perform Grooming: with supervision Pt Will Perform Upper Body Bathing: with supervision Pt Will Perform Upper Body Dressing: with supervision Pt Will Transfer to Toilet: with min assist;bedside commode  OT Frequency: Min 2X/week   Barriers to D/C: Decreased caregiver support          Co-evaluation              AM-PAC OT "6 Clicks" Daily Activity     Outcome Measure Help from another person eating meals?: None Help from another person taking care of personal grooming?: A Little Help from another person toileting, which includes using toliet, bedpan, or urinal?: A Lot Help from another person bathing (including washing, rinsing, drying)?: A Lot Help from another person to put on and taking off regular upper body clothing?: A Little Help from another person to put on and taking off regular lower body clothing?: A Lot 6 Click Score: 16   End of Session Equipment Utilized During Treatment: Oxygen Nurse Communication: Mobility status  Activity Tolerance: Patient tolerated treatment well Patient left: in bed;with bed alarm set;with call bell/phone within reach  OT Visit Diagnosis: Muscle weakness (generalized) (M62.81)                Time: 8185-6314 OT Time Calculation (min): 23 min Charges:  OT General Charges $OT Visit: 1 Visit OT Evaluation $OT Eval Moderate Complexity: 1 Mod  03/19/2020  Rich, OTR/L  Acute Rehabilitation Services  Office:  [phone]   Metta Clines 03/19/2020, 8:37 AM

## 2020-03-19 NOTE — Progress Notes (Addendum)
    Transesophageal Echocardiogram Note  Lauren Wood [id number] [birth year]  Procedure: Transesophageal Echocardiogram Indications: Mitral regurgitation  Procedure Details Consent: Obtained Time Out: Verified patient identification, verified procedure, site/side was marked, verified correct patient position, special equipment/implants available, Radiology Safety Procedures followed,  medications/allergies/relevent history reviewed, required imaging and test results available.  Performed  Medications:  Pt sedated by anesthesia with diprovan 326 mg IV total.  EF 35-40; moderate LAE; probable mild prolapse of anterior MV leaflet and mildly restricted posterior leaflet; severe MR.  Complications: No apparent complications Patient did tolerate procedure well.  Kirk Ruths, MD

## 2020-03-20 DIAGNOSIS — I340 Nonrheumatic mitral (valve) insufficiency: Secondary | ICD-10-CM | POA: Diagnosis not present

## 2020-03-20 DIAGNOSIS — I509 Heart failure, unspecified: Secondary | ICD-10-CM | POA: Diagnosis not present

## 2020-03-20 DIAGNOSIS — I5043 Acute on chronic combined systolic (congestive) and diastolic (congestive) heart failure: Secondary | ICD-10-CM | POA: Diagnosis not present

## 2020-03-20 LAB — BASIC METABOLIC PANEL
Anion gap: 13 (ref 5–15)
BUN: 21 mg/dL — ABNORMAL HIGH (ref 6–20)
CO2: 34 mmol/L — ABNORMAL HIGH (ref 22–32)
Calcium: 9.5 mg/dL (ref 8.9–10.3)
Chloride: 96 mmol/L — ABNORMAL LOW (ref 98–111)
Creatinine, Ser: 1.34 mg/dL — ABNORMAL HIGH (ref 0.44–1.00)
GFR, Estimated: 45 mL/min — ABNORMAL LOW (ref >60)
Glucose, Bld: 106 mg/dL — ABNORMAL HIGH (ref 70–99)
Potassium: 3.7 mmol/L (ref 3.5–5.1)
Sodium: 143 mmol/L (ref 135–145)

## 2020-03-20 MED ORDER — FUROSEMIDE 40 MG PO TABS
40.0000 mg | ORAL_TABLET | Freq: Once | ORAL | Status: AC
  Administered 2020-03-20: 40 mg via ORAL
  Filled 2020-03-20: qty 1

## 2020-03-20 MED ORDER — POTASSIUM CHLORIDE 20 MEQ PO PACK
40.0000 meq | PACK | Freq: Once | ORAL | Status: AC
  Administered 2020-03-20: 40 meq via ORAL
  Filled 2020-03-20: qty 2

## 2020-03-20 MED ORDER — FUROSEMIDE 80 MG PO TABS
80.0000 mg | ORAL_TABLET | Freq: Every day | ORAL | Status: DC
  Administered 2020-03-21 – 2020-03-25 (×5): 80 mg via ORAL
  Filled 2020-03-20 (×5): qty 1

## 2020-03-20 MED ORDER — CARVEDILOL 25 MG PO TABS
25.0000 mg | ORAL_TABLET | Freq: Two times a day (BID) | ORAL | Status: DC
  Administered 2020-03-20 – 2020-03-24 (×8): 25 mg via ORAL
  Filled 2020-03-20 (×8): qty 1

## 2020-03-20 MED ORDER — FUROSEMIDE 40 MG PO TABS
40.0000 mg | ORAL_TABLET | Freq: Every day | ORAL | Status: DC
  Administered 2020-03-20: 40 mg via ORAL
  Filled 2020-03-20: qty 1

## 2020-03-20 NOTE — Progress Notes (Signed)
Family Medicine Teaching Service Daily Progress Note Intern Pager: [phone]  Patient name: Lauren Wood Medical record number: [id number] Date of birth: January 25, 1961 Age: 59 y.o. Gender: female  Primary Care Provider: [name], MD Consultants: Cardiology Code Status: FULL  Pt Overview and Major Events to Date:   11/2: admitted, started IV diuresis 11/3: echo showed severe mitral regurg, cardiology consulted 11/5: TEE performed   Assessment and Plan:  Lauren Wood a 59 y.o.femalepresenting with worsening dyspnea. PMH is significant forHFmrEF, OSA,OHS, HTN, CKD stage 3, GERD, gout,borderline personality disorderand intellectual disability.  Acute Hypoxic Respiratory Failure 2/2 CHF exacerbation  Severe Mitral Regurgitation Patient denies breathing complaints. Stable on 3L with RR 18 and SpO2 >92%. TEE yesterday showed EF 35-40%, severe MR due to mild prolapse of anterior MV leaflet with mildly restricted posterior leaflet. Repeat CXR yesterday concerning for early pulmonary edema. Repeat trop obtained yesterday wnl at 29. -Cardiology following, appreciate recommendations -Discussion with structural heart team for options for long term management of patient's MR -Wean O2 as tolerated -Lasix 40mg  PO BID today, 80mg  PO daily starting tomorrow -Continue home spironolactone -Will restart home carvedilol -Daily BMP -Strict Is/Os -Daily weights -2g Na, 2L fluid restricted diet  R Foot Pain X-ray yesterday demonstrated generalized soft tissue edema without acute osseous abnormality. Also with degenerative changes at first MTP and midfoot OA. -Tylenol 650mg  q6h prn for pain -Ongoing diuresis -PT/OT  HTN Most recent BP 136/87. BP well controlled overnight and this morning. Prior to that had been elevated to 696E systolic. -Continue Losartan 25mg  daily -Continue home prazosin  -Diuresis as above -Continue to monitor BP  CKD Stage 3 Chronic, stable. Cr at her  baseline- 1.36 this morning (1.42 yesterday).  -Daily BMP while diuresing  OSA   OHS Chronic, stable. -CPAP at night  Borderline Personality Disorder, Depression Stable. Followed by Psych outpatient. -Continue home bupropion, fluoxetine, mirtazapine, trazodone,cogentin, and clonazepam  Gout Chronic, stable. -Holding home allopurinol  FEN/GI: 2g Na diet, 2L fluid restriction PPx: Lovenox   Status is: Inpatient Remains inpatient appropriate because:IV treatments appropriate due to intensity of illness or inability to take PO   Dispo: The patient is from: Home              Anticipated d/c is to: SNF              Anticipated d/c date is: 2 days              Patient currently is not medically stable to d/c.   Subjective:  NAOE. Patient denies complaints this morning. States her breathing feels good. She still has some discomfort in her R foot and also her L foot intermittently.  Objective: Temp:  [97.7 F (36.5 C)-99.3 F (37.4 C)] 99 F (37.2 C) (11/06 0603) Pulse Rate:  [93-98] 96 (11/06 0603) Resp:  [14-37] 20 (11/06 0603) BP: (131-168)/(71-106) 136/87 (11/06 0603) SpO2:  [93 %-100 %] 100 % (11/06 0603) Weight:  [155.4 kg] 155.4 kg (11/05 1050) Physical Exam: General: alert, well-appearing, NAD Cardiovascular: RRR, normal S1/S2 without m/r/g Respiratory: normal WOB on 3L, lungs CTAB although limited by body habitus Abdomen: soft, nontender Extremities: trace nonpitting pedal edema bilaterally, no tenderness of R or L foot  Laboratory: Recent Labs  Lab 03/16/20 1038 03/18/20 0301  WBC 11.2* 11.4*  HGB 11.2* 11.1*  HCT 37.7 36.8  PLT 263 255   Recent Labs  Lab 03/18/20 0301 03/19/20 0158 03/20/20 0354  NA 144 141 143  K 3.5  3.9 3.7  CL 100 96* 96*  CO2 34* 30 34*  BUN 12 16 21*  CREATININE 1.20* 1.42* 1.34*  CALCIUM 9.2 9.2 9.5  GLUCOSE 117* 110* 106*    Imaging/Diagnostic Tests: DG CHEST PORT 1 VIEW Result Date: 03/19/2020 IMPRESSION:  Evidence of early pulmonary edema.   DG Foot 2 Views Right Result Date: 03/19/2020 IMPRESSION:  1. Generalized soft tissue edema of the foot. No acute osseous abnormality.  2. Hallux valgus with mild degenerative change at the first metatarsophalangeal joint. Moderate midfoot osteoarthritis.   ECHO TEE Result Date: 03/19/2020 IMPRESSIONS   1. Moderate global reduction in LV systolic function; moderate LAE; mild prolapse of anterior MV leaflet with mildly restricted posterior leaflet; severe MR.   2. Left ventricular ejection fraction, by estimation, is 35 to 40%. The left ventricle has moderately decreased function. The left ventricle demonstrates global hypokinesis.   3. Right ventricular systolic function is normal. The right ventricular size is normal.  4. Left atrial size was moderately dilated. No left atrial/left atrial appendage thrombus was detected.   5. The mitral valve is abnormal. Severe mitral valve regurgitation.   6. The aortic valve is tricuspid. Aortic valve regurgitation is not visualized. No aortic stenosis is present.   7. There is mild (Grade II) plaque involving the descending aorta.     Alcus Dad, MD 03/20/2020, 6:23 AM PGY-1, [place] Intern pager: [phone], text pages welcome

## 2020-03-20 NOTE — Progress Notes (Addendum)
Progress Note  Patient Name: Lauren Wood Date of Encounter: 03/20/2020  [facility] Cardiologist: Minus Breeding, MD   Subjective   Patient notes that her procedure went fine.  No swallowing difficulties.  No SOB.  Inpatient Medications    Scheduled Meds: . aspirin  81 mg Oral Daily  . benztropine  0.25 mg Oral BID  . buPROPion  150 mg Oral q morning - 10a  . clonazepam  0.25 mg Oral QHS  . enoxaparin (LOVENOX) injection  80 mg Subcutaneous Daily  . FLUoxetine  40 mg Oral q morning - 10a  . losartan  25 mg Oral Daily  . mirtazapine  30 mg Oral QHS  . pantoprazole  40 mg Oral Daily  . prazosin  1 mg Oral QHS  . pregabalin  50 mg Oral Daily  . risperidone  4 mg Oral BID  . spironolactone  25 mg Oral Daily  . traZODone  100 mg Oral QHS   Vital Signs    Vitals:   03/19/20 1202 03/19/20 1243 03/19/20 2011 03/20/20 0603  BP: (!) 154/100 (!) 156/71 131/78 136/87  Pulse: 94 96 96 96  Resp: (!) 37 20 20 20   Temp:  98.9 F (37.2 C) 99.3 F (37.4 C) 99 F (37.2 C)  TempSrc:  Oral Oral Oral  SpO2: 98% 98% 96% 100%  Weight:    (!) 156.4 kg  Height:        Intake/Output Summary (Last 24 hours) at 03/20/2020 0842 Last data filed at 03/20/2020 6301 Gross per 24 hour  Intake 440 ml  Output 651 ml  Net -211 ml   Last 3 Weights 03/20/2020 03/19/2020 03/19/2020  Weight (lbs) 344 lb 12.8 oz 342 lb 9.5 oz 342 lb 9.5 oz  Weight (kg) 156.4 kg 155.4 kg 155.4 kg      Telemetry    SR - Personally Reviewed  ECG    No new tracing   Physical Exam   Weaned down to 2 L O2, Sat 93%  GEN: No acute distress.   Neck: JVD difficult to assess due to body habituate Cardiac: RRR, 3/6 late systolic murmurs, rubs, or gallops.  Respiratory: course breath sound  GI: Soft, nontender, non-distended  MS: No edema; No deformity. Neuro:  Nonfocal  Psych: Normal affect   Labs    High Sensitivity Troponin:   Recent Labs  Lab 03/16/20 1611 03/16/20 1946 03/19/20 1518    TROPONINIHS 35* 30* 29*      Chemistry Recent Labs  Lab 03/18/20 0301 03/19/20 0158 03/20/20 0354  NA 144 141 143  K 3.5 3.9 3.7  CL 100 96* 96*  CO2 34* 30 34*  GLUCOSE 117* 110* 106*  BUN 12 16 21*  CREATININE 1.20* 1.42* 1.34*  CALCIUM 9.2 9.2 9.5  GFRNONAA 52* 42* 45*  ANIONGAP 10 15 13      Hematology Recent Labs  Lab 03/16/20 1038 03/18/20 0301  WBC 11.2* 11.4*  RBC 4.34 4.21  HGB 11.2* 11.1*  HCT 37.7 36.8  MCV 86.9 87.4  MCH 25.8* 26.4  MCHC 29.7* 30.2  RDW 14.6 14.9  PLT 263 255    BNP Recent Labs  Lab 03/16/20 1038  BNP 232.4*    DDimer No results for input(s): DDIMER in the last 168 hours.   Radiology    DG CHEST PORT 1 VIEW  Result Date: 03/19/2020 CLINICAL DATA:  58 year old female with shortness of breath and right foot pain EXAM: PORTABLE CHEST 1 VIEW COMPARISON:  03/16/2020  FINDINGS: Cardiomediastinal silhouette unchanged with cardiomegaly. Low lung volumes. No pneumothorax or pleural effusion. Fullness in the central vasculature. No confluent airspace disease. IMPRESSION: Evidence of early pulmonary edema. Electronically Signed   By: Corrie Mckusick D.O.   On: 03/19/2020 16:35   DG Foot 2 Views Right  Result Date: 03/19/2020 CLINICAL DATA:  Right foot pain. EXAM: RIGHT FOOT - 2 VIEW COMPARISON:  None. FINDINGS: Prominent hallux valgus with mild degenerative change at the first metatarsal phalangeal joint. There are hammertoe deformity of the second through fifth digits. Moderate midfoot osteoarthritis. There is a plantar calcaneal spur and Achilles tendon enthesophyte. No erosions, periosteal reaction, fracture or evidence of focal lesion. No soft tissue air. No radiopaque foreign body. There is generalized soft tissue edema of the foot. IMPRESSION: 1. Generalized soft tissue edema of the foot. No acute osseous abnormality. 2. Hallux valgus with mild degenerative change at the first metatarsophalangeal joint. Moderate midfoot osteoarthritis.  Electronically Signed   By: Keith Rake M.D.   On: 03/19/2020 16:41   ECHO TEE  Result Date: 03/19/2020    TRANSESOPHOGEAL ECHO REPORT   Patient Name:   Lauren Wood Date of Exam: 03/19/2020 Medical Rec #:  [id number]       Height:       64.0 in Accession #:    [id number]      Weight:       342.6 lb Date of Birth:  13-Jul-1960       BSA:          2.459 m Patient Age:    59 years        BP:           134/93 mmHg Patient Gender: F               HR:           104 bpm. Exam Location:  Inpatient Procedure: 3D Echo, Transesophageal Echo, Cardiac Doppler and Color Doppler Indications:     I34.0 Nonrheumatic mitral (valve) insufficiency  History:         Patient has prior history of Echocardiogram examinations, most                  recent 03/17/2020. CHF, Mitral Valve Disease; Risk Factors:Sleep                  Apnea. Hypoxia. Mitral regurgitation.  Sonographer:     Roseanna Rainbow RDCS Referring Phys:  [place] Diagnosing Phys: Kirk Ruths MD PROCEDURE: After discussion of the risks and benefits of a TEE, an informed consent was obtained from the patient. The transesophogeal probe was passed without difficulty through the esophogus of the patient. Imaged were obtained with the patient in a left lateral decubitus position. Local oropharyngeal anesthetic was provided with Cetacaine. Sedation performed by different physician. The patient was monitored while under deep sedation. Anesthestetic sedation was provided intravenously by Anesthesiology: 326mg  of Propofol. The patient developed Respiratory depression during the procedure. IMPRESSIONS  1. Moderate global reduction in LV systolic function; moderate LAE; mild prolapse of anterior MV leaflet with mildly restricted posterior leaflet; severe MR.  2. Left ventricular ejection fraction, by estimation, is 35 to 40%. The left ventricle has moderately decreased function. The left ventricle demonstrates global hypokinesis.  3. Right ventricular systolic  function is normal. The right ventricular size is normal.  4. Left atrial size was moderately dilated. No left atrial/left atrial appendage thrombus was detected.  5. The mitral valve  is abnormal. Severe mitral valve regurgitation.  6. The aortic valve is tricuspid. Aortic valve regurgitation is not visualized. No aortic stenosis is present.  7. There is mild (Grade II) plaque involving the descending aorta. FINDINGS  Left Ventricle: Left ventricular ejection fraction, by estimation, is 35 to 40%. The left ventricle has moderately decreased function. The left ventricle demonstrates global hypokinesis. The left ventricular internal cavity size was normal in size. Right Ventricle: The right ventricular size is normal. Right vetricular wall thickness was not assessed. Right ventricular systolic function is normal. Left Atrium: Left atrial size was moderately dilated. No left atrial/left atrial appendage thrombus was detected. Right Atrium: Right atrial size was normal in size. Pericardium: There is no evidence of pericardial effusion. Mitral Valve: The mitral valve is abnormal. Severe mitral valve regurgitation. MV peak gradient, 7.4 mmHg. The mean mitral valve gradient is 3.0 mmHg. Tricuspid Valve: The tricuspid valve is normal in structure. Tricuspid valve regurgitation is trivial. Aortic Valve: The aortic valve is tricuspid. Aortic valve regurgitation is not visualized. No aortic stenosis is present. Pulmonic Valve: The pulmonic valve was normal in structure. Pulmonic valve regurgitation is trivial. Aorta: The aortic root is normal in size and structure. There is mild (Grade II) plaque involving the descending aorta. IAS/Shunts: No atrial level shunt detected by color flow Doppler. Additional Comments: Moderate global reduction in LV systolic function; moderate LAE; mild prolapse of anterior MV leaflet with mildly restricted posterior leaflet; severe MR.  MITRAL VALVE MV Peak grad: 7.4 mmHg MV Mean grad: 3.0 mmHg MV  Vmax:      1.36 m/s MV Vmean:     74.9 cm/s MR Peak grad:    101.2 mmHg MR Mean grad:    64.5 mmHg MR Vmax:         503.00 cm/s MR Vmean:        379.5 cm/s MR PISA:         2.26 cm MR PISA Eff ROA: 17 mm MR PISA Radius:  0.60 cm Kirk Ruths MD Electronically signed by Kirk Ruths MD Signature Date/Time: 03/19/2020/12:01:53 PM    Final     Cardiac Studies    TEE:  EF 40%;  Mixed mechanism of severe MR:  Slight A2 prolapse P2 restriction.  Echo 03/17/2020  1. Abnormal mitral valve with probable severe mitral valve regurgitation,  there may be multiple jets, and jet is eccentric. Suboptimally  interrogated on this exam. There is evidence of LV dysfunction, and  possible LV dilation. MR is best visualized in  clip 51. Consider TEE if clinically indicated to better assess valve  structure which is not well assessed on this exam.   2. Left ventricular ejection fraction, by estimation, is 40 to 45%. LV  end diastolic dimension is 60 mm, but indexes to normal size based on BSA.  The left ventricle has mildly decreased function. Left ventricular  endocardial border not optimally defined   to evaluate regional wall motion despite use of Definity. Global  hypokinesis. There is mild left ventricular hypertrophy. Left ventricular  diastolic parameters are indeterminate.   3. Right ventricular systolic function is mildly reduced. The right  ventricular size is mildly enlarged. There is mildly elevated pulmonary  artery systolic pressure. The estimated right ventricular systolic  pressure is 73.2 mmHg.   4. Left atrial size was mild to moderately dilated.   5. The mitral valve was not well visualized. Severe mitral valve  regurgitation.   6. The aortic valve was not well visualized.  Aortic valve regurgitation  is not visualized. No aortic stenosis is present.   7. The inferior vena cava is dilated in size with <50% respiratory  variability, suggesting right atrial pressure of 15 mmHg.    Patient Profile     59 y.o. female with a hx of chronic systolic and diastolic heart failure, HTN, CKD stage III, OSA on CPAP, borderline personality disorder, intellectual disability who is being seen  for the evaluation of dyspnea at the request of Dr. Thompson Grayer.  Assessment & Plan    1. Acute on chronic systolic and diastolic CHF - Echo this admission showed stable LVEF at 40%   - Losartan 25mg  qd and spironolactone 25mg  qd - Can restart home coreg 25 mg - Planned for lasix 40 mg PO BID today; 80 mg daily tomorrow  2. Severe mitral regurgitation with morbid obesity - Patient meets surgical indication for mitral valve repair/replacement; given other comorbidities concerned patient may be poor surgical candidate - Echo showed abnormal mitral valve with probable severe mitral valve regurgitation, there may be multiple jets, and jet is eccentric.  - will discuss with primary cardiologist and structural heart team about possible outpatient interventions  3. Hypokalemia - improving, K 3.7 today  4. HTN - Coreg as above - If persistently hypertensive, consider hydralazine 10 mg BID and can increase as needed  5. AKI on CKD III - Improved kidney function; will transition to PO lasix today  6. Hypomagnesemia - MG 2.1 yesterday  IFor questions or updates, please contact [place] Please consult [email or web address] for contact info under     Signed, Werner Lean, MD  03/20/2020, 8:42 AM

## 2020-03-20 NOTE — Progress Notes (Signed)
Paged by nurse that she got a call from central telemetry that patient has had sustained VT. Nurse reports patient is clinically stable/asymptomatic witout acute change in status. Reviewed telemetry monitor - it frequently tags her rhythm as VT due to NSIVCD but is confirmed to be NSR/borderline sinus tach with occasional PVCs and couplets.  K this AM 3.7, due for potassium repletion this afternoon. Mg OK. Reviewed with MD who agrees as well. We do not see any sustained VT. Continue current plan as outlined otherwise.  Riannon Mukherjee PA-C

## 2020-03-20 NOTE — TOC Progression Note (Signed)
Transition of Care [facility]) - Progression Note    Patient Details  Name: Lauren Wood MRN: [id number] Date of Birth: [birth year]  Transition of Care [facility]) CM/SW [place], [place] Phone Number: 03/20/2020, 11:38 AM  Clinical Narrative:    CSW spoke with pt's sister Arlene by phone [phone] in reference to pt's dc plans and MD/PT recommendation of SNF. Arlene stated that with the ongoing pandemic, she would prefer pt to come home and have PT/OT done there. CSW will make a referral to the [facility].         Expected Discharge Plan and Services                                                 Social Determinants of Health (SDOH) Interventions    Readmission Risk Interventions No flowsheet data found.

## 2020-03-21 DIAGNOSIS — I509 Heart failure, unspecified: Secondary | ICD-10-CM | POA: Diagnosis not present

## 2020-03-21 DIAGNOSIS — E875 Hyperkalemia: Secondary | ICD-10-CM

## 2020-03-21 DIAGNOSIS — I5043 Acute on chronic combined systolic (congestive) and diastolic (congestive) heart failure: Secondary | ICD-10-CM | POA: Diagnosis not present

## 2020-03-21 DIAGNOSIS — I340 Nonrheumatic mitral (valve) insufficiency: Secondary | ICD-10-CM | POA: Diagnosis not present

## 2020-03-21 DIAGNOSIS — R0902 Hypoxemia: Secondary | ICD-10-CM | POA: Diagnosis not present

## 2020-03-21 LAB — CBC
HCT: 38.3 % (ref 36.0–46.0)
Hemoglobin: 12 g/dL (ref 12.0–15.0)
MCH: 27 pg (ref 26.0–34.0)
MCHC: 31.3 g/dL (ref 30.0–36.0)
MCV: 86.1 fL (ref 80.0–100.0)
Platelets: 266 10*3/uL (ref 150–400)
RBC: 4.45 MIL/uL (ref 3.87–5.11)
RDW: 14.8 % (ref 11.5–15.5)
WBC: 13.2 10*3/uL — ABNORMAL HIGH (ref 4.0–10.5)
nRBC: 0 % (ref 0.0–0.2)

## 2020-03-21 LAB — BASIC METABOLIC PANEL
Anion gap: 12 (ref 5–15)
Anion gap: 14 (ref 5–15)
BUN: 22 mg/dL — ABNORMAL HIGH (ref 6–20)
BUN: 20 mg/dL (ref 6–20)
CO2: 29 mmol/L (ref 22–32)
CO2: 31 mmol/L (ref 22–32)
Calcium: 9.2 mg/dL (ref 8.9–10.3)
Calcium: 9.3 mg/dL (ref 8.9–10.3)
Chloride: 96 mmol/L — ABNORMAL LOW (ref 98–111)
Chloride: 92 mmol/L — ABNORMAL LOW (ref 98–111)
Creatinine, Ser: 1.18 mg/dL — ABNORMAL HIGH (ref 0.44–1.00)
Creatinine, Ser: 1.11 mg/dL — ABNORMAL HIGH (ref 0.44–1.00)
GFR, Estimated: 53 mL/min — ABNORMAL LOW (ref >60)
GFR, Estimated: 57 mL/min — ABNORMAL LOW (ref >60)
Glucose, Bld: 101 mg/dL — ABNORMAL HIGH (ref 70–99)
Glucose, Bld: 109 mg/dL — ABNORMAL HIGH (ref 70–99)
Potassium: 6.2 mmol/L — ABNORMAL HIGH (ref 3.5–5.1)
Potassium: 4.1 mmol/L (ref 3.5–5.1)
Sodium: 137 mmol/L (ref 135–145)
Sodium: 137 mmol/L (ref 135–145)

## 2020-03-21 LAB — MAGNESIUM: Magnesium: 2.2 mg/dL (ref 1.7–2.4)

## 2020-03-21 MED ORDER — LOSARTAN POTASSIUM 25 MG PO TABS
25.0000 mg | ORAL_TABLET | Freq: Every day | ORAL | Status: DC
  Administered 2020-03-21 – 2020-03-25 (×5): 25 mg via ORAL
  Filled 2020-03-21 (×5): qty 1

## 2020-03-21 MED ORDER — SPIRONOLACTONE 25 MG PO TABS
25.0000 mg | ORAL_TABLET | Freq: Every day | ORAL | Status: DC
  Administered 2020-03-21 – 2020-03-25 (×5): 25 mg via ORAL
  Filled 2020-03-21 (×5): qty 1

## 2020-03-21 NOTE — Progress Notes (Addendum)
Progress Note  Patient Name: Lauren Wood Date of Encounter: 03/21/2020  [facility] Cardiologist: Minus Breeding, MD   Subjective   Feels well.  No SOB, not stiff this AM.  Inpatient Medications    Scheduled Meds: . aspirin  81 mg Oral Daily  . benztropine  0.25 mg Oral BID  . buPROPion  150 mg Oral q morning - 10a  . carvedilol  25 mg Oral BID WC  . clonazepam  0.25 mg Oral QHS  . enoxaparin (LOVENOX) injection  80 mg Subcutaneous Daily  . FLUoxetine  40 mg Oral q morning - 10a  . furosemide  80 mg Oral Daily  . losartan  25 mg Oral Daily  . mirtazapine  30 mg Oral QHS  . pantoprazole  40 mg Oral Daily  . prazosin  1 mg Oral QHS  . pregabalin  50 mg Oral Daily  . risperidone  4 mg Oral BID  . spironolactone  25 mg Oral Daily  . traZODone  100 mg Oral QHS   Vital Signs    Vitals:   03/20/20 1357 03/20/20 2013 03/20/20 2300 03/21/20 0433  BP: (!) 152/73 118/78  (!) 142/87  Pulse: 99 90 96 95  Resp: 18 20 20 20   Temp: 99 F (37.2 C) 99.4 F (37.4 C)  97.8 F (36.6 C)  TempSrc: Oral Oral  Oral  SpO2:  95% 95% 96%  Weight:    (!) 156.2 kg  Height:        Intake/Output Summary (Last 24 hours) at 03/21/2020 0740 Last data filed at 03/21/2020 0443 Gross per 24 hour  Intake 360 ml  Output 650 ml  Net -290 ml   Last 3 Weights 03/21/2020 03/20/2020 03/19/2020  Weight (lbs) 344 lb 4.8 oz 344 lb 12.8 oz 342 lb 9.5 oz  Weight (kg) 156.173 kg 156.4 kg 155.4 kg      Telemetry    SR-Sinus tach, occasional PVCs; IVCD; no evidence of sustained VT - Personally Reviewed  ECG    No new tracing   Physical Exam   Weaned down to 1 L O2, Sat 93%  GEN: Morbidly Obese No acute distress.   Neck: JVD difficult to assess due to body habituate Cardiac: RRR, 3/6 late systolic murmurs, rubs, or gallops.  Respiratory: Improved breath sounds, good air movement GI: Soft, nontender, non-distended  MS: No edema; No deformity. Neuro:  Nonfocal  Psych: Normal affect    Labs    High Sensitivity Troponin:   Recent Labs  Lab 03/16/20 1611 03/16/20 1946 03/19/20 1518  TROPONINIHS 35* 30* 29*      Chemistry Recent Labs  Lab 03/19/20 0158 03/20/20 0354 03/21/20 0258  NA 141 143 137  K 3.9 3.7 6.2*  CL 96* 96* 96*  CO2 30 34* 29  GLUCOSE 110* 106* 101*  BUN 16 21* 22*  CREATININE 1.42* 1.34* 1.18*  CALCIUM 9.2 9.5 9.2  GFRNONAA 42* 45* 53*  ANIONGAP 15 13 12      Hematology Recent Labs  Lab 03/16/20 1038 03/18/20 0301 03/21/20 0258  WBC 11.2* 11.4* 13.2*  RBC 4.34 4.21 4.45  HGB 11.2* 11.1* 12.0  HCT 37.7 36.8 38.3  MCV 86.9 87.4 86.1  MCH 25.8* 26.4 27.0  MCHC 29.7* 30.2 31.3  RDW 14.6 14.9 14.8  PLT 263 255 266    BNP Recent Labs  Lab 03/16/20 1038  BNP 232.4*    DDimer No results for input(s): DDIMER in the last 168 hours.  Radiology    DG CHEST PORT 1 VIEW  Result Date: 03/19/2020 CLINICAL DATA:  59 year old female with shortness of breath and right foot pain EXAM: PORTABLE CHEST 1 VIEW COMPARISON:  03/16/2020 FINDINGS: Cardiomediastinal silhouette unchanged with cardiomegaly. Low lung volumes. No pneumothorax or pleural effusion. Fullness in the central vasculature. No confluent airspace disease. IMPRESSION: Evidence of early pulmonary edema. Electronically Signed   By: Corrie Mckusick D.O.   On: 03/19/2020 16:35   DG Foot 2 Views Right  Result Date: 03/19/2020 CLINICAL DATA:  Right foot pain. EXAM: RIGHT FOOT - 2 VIEW COMPARISON:  None. FINDINGS: Prominent hallux valgus with mild degenerative change at the first metatarsal phalangeal joint. There are hammertoe deformity of the second through fifth digits. Moderate midfoot osteoarthritis. There is a plantar calcaneal spur and Achilles tendon enthesophyte. No erosions, periosteal reaction, fracture or evidence of focal lesion. No soft tissue air. No radiopaque foreign body. There is generalized soft tissue edema of the foot. IMPRESSION: 1. Generalized soft tissue edema  of the foot. No acute osseous abnormality. 2. Hallux valgus with mild degenerative change at the first metatarsophalangeal joint. Moderate midfoot osteoarthritis. Electronically Signed   By: Keith Rake M.D.   On: 03/19/2020 16:41   ECHO TEE  Result Date: 03/19/2020    TRANSESOPHOGEAL ECHO REPORT   Patient Name:   Lauren Wood Date of Exam: 03/19/2020 Medical Rec #:  [id number]       Height:       64.0 in Accession #:    [id number]      Weight:       342.6 lb Date of Birth:  09-03-1960       BSA:          2.459 m Patient Age:    59 years        BP:           134/93 mmHg Patient Gender: F               HR:           104 bpm. Exam Location:  Inpatient Procedure: 3D Echo, Transesophageal Echo, Cardiac Doppler and Color Doppler Indications:     I34.0 Nonrheumatic mitral (valve) insufficiency  History:         Patient has prior history of Echocardiogram examinations, most                  recent 03/17/2020. CHF, Mitral Valve Disease; Risk Factors:Sleep                  Apnea. Hypoxia. Mitral regurgitation.  Sonographer:     Roseanna Rainbow RDCS Referring Phys:  [place] Diagnosing Phys: Kirk Ruths MD PROCEDURE: After discussion of the risks and benefits of a TEE, an informed consent was obtained from the patient. The transesophogeal probe was passed without difficulty through the esophogus of the patient. Imaged were obtained with the patient in a left lateral decubitus position. Local oropharyngeal anesthetic was provided with Cetacaine. Sedation performed by different physician. The patient was monitored while under deep sedation. Anesthestetic sedation was provided intravenously by Anesthesiology: 326mg  of Propofol. The patient developed Respiratory depression during the procedure. IMPRESSIONS  1. Moderate global reduction in LV systolic function; moderate LAE; mild prolapse of anterior MV leaflet with mildly restricted posterior leaflet; severe MR.  2. Left ventricular ejection fraction, by  estimation, is 35 to 40%. The left ventricle has moderately decreased function. The left ventricle demonstrates global hypokinesis.  3. Right ventricular systolic function is normal. The right ventricular size is normal.  4. Left atrial size was moderately dilated. No left atrial/left atrial appendage thrombus was detected.  5. The mitral valve is abnormal. Severe mitral valve regurgitation.  6. The aortic valve is tricuspid. Aortic valve regurgitation is not visualized. No aortic stenosis is present.  7. There is mild (Grade II) plaque involving the descending aorta. FINDINGS  Left Ventricle: Left ventricular ejection fraction, by estimation, is 35 to 40%. The left ventricle has moderately decreased function. The left ventricle demonstrates global hypokinesis. The left ventricular internal cavity size was normal in size. Right Ventricle: The right ventricular size is normal. Right vetricular wall thickness was not assessed. Right ventricular systolic function is normal. Left Atrium: Left atrial size was moderately dilated. No left atrial/left atrial appendage thrombus was detected. Right Atrium: Right atrial size was normal in size. Pericardium: There is no evidence of pericardial effusion. Mitral Valve: The mitral valve is abnormal. Severe mitral valve regurgitation. MV peak gradient, 7.4 mmHg. The mean mitral valve gradient is 3.0 mmHg. Tricuspid Valve: The tricuspid valve is normal in structure. Tricuspid valve regurgitation is trivial. Aortic Valve: The aortic valve is tricuspid. Aortic valve regurgitation is not visualized. No aortic stenosis is present. Pulmonic Valve: The pulmonic valve was normal in structure. Pulmonic valve regurgitation is trivial. Aorta: The aortic root is normal in size and structure. There is mild (Grade II) plaque involving the descending aorta. IAS/Shunts: No atrial level shunt detected by color flow Doppler. Additional Comments: Moderate global reduction in LV systolic function;  moderate LAE; mild prolapse of anterior MV leaflet with mildly restricted posterior leaflet; severe MR.  MITRAL VALVE MV Peak grad: 7.4 mmHg MV Mean grad: 3.0 mmHg MV Vmax:      1.36 m/s MV Vmean:     74.9 cm/s MR Peak grad:    101.2 mmHg MR Mean grad:    64.5 mmHg MR Vmax:         503.00 cm/s MR Vmean:        379.5 cm/s MR PISA:         2.26 cm MR PISA Eff ROA: 17 mm MR PISA Radius:  0.60 cm Kirk Ruths MD Electronically signed by Kirk Ruths MD Signature Date/Time: 03/19/2020/12:01:53 PM    Final     Cardiac Studies    TEE:  EF 40%;  Mixed mechanism of severe MR:  Slight A2 prolapse P2 restriction.  Echo 03/17/2020  1. Abnormal mitral valve with probable severe mitral valve regurgitation,  there may be multiple jets, and jet is eccentric. Suboptimally  interrogated on this exam. There is evidence of LV dysfunction, and  possible LV dilation. MR is best visualized in  clip 51. Consider TEE if clinically indicated to better assess valve  structure which is not well assessed on this exam.   2. Left ventricular ejection fraction, by estimation, is 40 to 45%. LV  end diastolic dimension is 60 mm, but indexes to normal size based on BSA.  The left ventricle has mildly decreased function. Left ventricular  endocardial border not optimally defined   to evaluate regional wall motion despite use of Definity. Global  hypokinesis. There is mild left ventricular hypertrophy. Left ventricular  diastolic parameters are indeterminate.   3. Right ventricular systolic function is mildly reduced. The right  ventricular size is mildly enlarged. There is mildly elevated pulmonary  artery systolic pressure. The estimated right ventricular systolic  pressure is 01.7 mmHg.  4. Left atrial size was mild to moderately dilated.   5. The mitral valve was not well visualized. Severe mitral valve  regurgitation.   6. The aortic valve was not well visualized. Aortic valve regurgitation  is not visualized. No  aortic stenosis is present.   7. The inferior vena cava is dilated in size with <50% respiratory  variability, suggesting right atrial pressure of 15 mmHg.   Patient Profile     59 y.o. female with a hx of chronic systolic and diastolic heart failure, HTN, CKD stage III, OSA on CPAP, borderline personality disorder, intellectual disability who is being seen  for the evaluation of dyspnea at the request of Dr. Thompson Grayer.  Assessment & Plan      Acute on chronic systolic and diastolic CHF - Echo this admission showed stable LVEF at 40%   - HOLDING AM dose of Losartan 25mg  qd and spironolactone 25mg  qd; would restart if hyperkalemia resolves - Can restart home coreg 25 mg - Will trial PO lasix and continue to wean O2.  Exam is difficult; if unable to wean O2 would consider   Severe mitral regurgitation with morbid obesity, mean gradient of only 3 mm Hg - Patient meets surgical indication for mitral valve repair/replacement; given other comorbidities concerned patient may be poor surgical candidate - 2a indication for edge-to-edge repair; reached out to structural team and primary cardiologist about outpatient interventions if appropriate  Hyperkalemia- presently With prior hypomagnesemia and hypokalemia - would decrease prophylactic supplementation with lasix - holding medications X 1 as above - Would continue diuresis - getting EKG - getting BMP  HTN - Coreg as above - If persistently hypertensive, consider hydralazine 10 mg BID and can increase as needed  AKI on CKD III - Improved kidney function  Discussed with patient, Sister [name], Brother Theodoro Doing  IFor questions or updates, please contact [place] Please consult [email or web address] for contact info under     Signed, Werner Lean, MD  03/21/2020, 7:40 AM    EKG Stable T waves without Heart block or sine waves; IVCD stable.   BMP repeat still not drawn.  Discussed with nursing and primary

## 2020-03-21 NOTE — Progress Notes (Signed)
Family Medicine Teaching Service Daily Progress Note Intern Pager: [phone]  Patient name: Lauren Wood Medical record number: [id number] Date of birth: [birth year] Age: 59 y.o. Gender: female  Primary Care Provider: [name], MD Consultants: Cardiology Code Status: FULL  Pt Overview and Major Events to Date:   11/2: admitted, started IV diuresis 11/3: echo showed severe mitral regurg, cardiology consulted 11/5: TEE performed  Assessment and Plan:  Lauren Duprey Whiteis a 59 y.o.femalepresented with worsening dyspnea. PMH is significant forHFmrEF, OSA,OHS, HTN, CKD stage 3, GERD, gout,borderline personality disorderand ID.  Acute Hypoxic Respiratory Failure 2/2 CHF exacerbation Severe Mitral Regurgitation Patient denies breathing complaints. Stable on 3L with RR 18 and SpO2 >92%. TEE yesterday showed EF 35-40%, severe MR due to mild prolapse of anterior MV leaflet with mildly restricted posterior leaflet. Repeat CXR yesterday concerning for early pulmonary edema. Repeat trop obtained yesterday wnl at 29.  Resting  -Cardiology following, appreciate recommendations -Discussion with structural heart team for options for long term management of patient's MR -Wean O2 as tolerated  -Transitioning to p.o. Lasix today -Continue home spironolactone -Holding losartan dose and spironolactone dose this morning but if hyperkalemia resolves can restart - carvedilol -Daily BMP -Strict I/Os -Daily weights  Hyperkalemia Potassium this morning of 6.2 from 3.7 at previous check. -Recheck BMP -Potassium level should be dropping with diuresis but may require Lokelma  R Foot Pain- neg xray -Tylenol 650mg  q6h prn for pain -Ongoing diuresis -PT/OT  HTN-blood pressure this morning 111/70 -Holding home losartan dose at this time -Continue home prazosin  -Diuresis as above  CKD Stage 3- Chronic, stable. Cr at her baseline-1.18 -Daily BMP - avoid nephrotoxic agents  OSA  OHS-  Chronic, stable. -CPAP at night  Borderline Personality Disorder, Depression- Stable. -Continue home bupropion, fluoxetine, mirtazapine, trazodone,cogentin, and clonazepam - follow up psych outpatient  Gout- Chronic, stable. -Holding home allopurinol  FEN/GI: 2g Na diet, 2L fluid restriction PPx: Lovenox  Status is: Inpatient Remains inpatient appropriate because:IV treatments appropriate due to intensity of illness or inability to take PO   Dispo: The patient is from: Home              Anticipated d/c is to: SNF              Anticipated d/c date is: 2 days              Patient currently is not medically stable to d/c.   Subjective:  Is doing well at this time.  Denies any chest pain or shortness of breath.  Reports that she feels her leg swelling is decreasing.  Objective: Temp:  [97.8 F (36.6 C)-99.4 F (37.4 C)] 99.3 F (37.4 C) (11/07 0830) Pulse Rate:  [90-105] 92 (11/07 0830) Resp:  [18-20] 20 (11/07 0830) BP: (111-152)/(70-87) 111/70 (11/07 0830) SpO2:  [92 %-96 %] 92 % (11/07 0830) Weight:  [156.2 kg] 156.2 kg (11/07 0433) Physical Exam: General: Resting comfortably, no acute distress Cardiovascular: Regular rate and rhythm, no murmurs appreciated Respiratory: normal WOB on 3L, lungs CTAB although limited by body habitus Abdomen: soft, nontender Extremities: trace nonpitting pedal edema bilaterally, no tenderness of R or L foot  Laboratory: Recent Labs  Lab 03/16/20 1038 03/18/20 0301 03/21/20 0258  WBC 11.2* 11.4* 13.2*  HGB 11.2* 11.1* 12.0  HCT 37.7 36.8 38.3  PLT 263 255 266   Recent Labs  Lab 03/19/20 0158 03/20/20 0354 03/21/20 0258  NA 141 143 137  K 3.9 3.7 6.2*  CL 96*  96* 96*  CO2 30 34* 29  BUN 16 21* 22*  CREATININE 1.42* 1.34* 1.18*  CALCIUM 9.2 9.5 9.2  GLUCOSE 110* 106* 101*    Imaging/Diagnostic Tests: DG CHEST PORT 1 VIEW Result Date: 03/19/2020 IMPRESSION: Evidence of early pulmonary edema.   DG Foot 2 Views  Right Result Date: 03/19/2020 IMPRESSION:  1. Generalized soft tissue edema of the foot. No acute osseous abnormality.  2. Hallux valgus with mild degenerative change at the first metatarsophalangeal joint. Moderate midfoot osteoarthritis.   ECHO TEE Result Date: 03/19/2020 IMPRESSIONS   1. Moderate global reduction in LV systolic function; moderate LAE; mild prolapse of anterior MV leaflet with mildly restricted posterior leaflet; severe MR.   2. Left ventricular ejection fraction, by estimation, is 35 to 40%. The left ventricle has moderately decreased function. The left ventricle demonstrates global hypokinesis.   3. Right ventricular systolic function is normal. The right ventricular size is normal.  4. Left atrial size was moderately dilated. No left atrial/left atrial appendage thrombus was detected.   5. The mitral valve is abnormal. Severe mitral valve regurgitation.   6. The aortic valve is tricuspid. Aortic valve regurgitation is not visualized. No aortic stenosis is present.   7. There is mild (Grade II) plaque involving the descending aorta.     Gifford Shave, MD 03/21/2020, 8:44 AM PGY-2, [place] Intern pager: [phone], text pages welcome

## 2020-03-21 NOTE — Progress Notes (Signed)
FPTS Interim Progress Note  S: Paged regarding patient experiencing chest tightness and dyspnea. Went to go check on patient. Patient denies chest pain and chest tightness. Admits to occasional dyspnea that she reports has been improving since being in the hospital. Denies any generalized or localized pain. Denies wheezing or recent nonproductive cough.  Has no concerns at this time.   O: BP 124/75 (BP Location: Left Wrist)   Pulse 85   Temp 98.9 F (37.2 C) (Oral)   Resp 20   Ht 5\' 4"  (1.626 m)   Wt (!) 156.2 kg   LMP 05/08/2013   SpO2 97%   BMI 59.10 kg/m   General: Patient sitting upright in bed, in no acute distress.  CV: RRR, no murmurs or gallops auscultated  Resp: lungs clear to auscultation bilaterally, no signs of respiratory distress, breathing comfortably on 2L O2 MSK: reproducible chest pain on palpation  Ext: radial and dorsalis pedis pulses strong and equal bilaterally, moderate LE edema noted bilaterally  A/P: -chest pain likely musculoskeletal in origin, very low concern for new cardiac etiology at this time, continue to monitor -last EKG earlier today essentially unchanged from previous -tylenol 650 mg given per nurse  -continue with current plan    Donney Dice, DO 03/21/2020, 8:50 PM PGY-1, [place] Medicine Service pager [phone]

## 2020-03-21 NOTE — Progress Notes (Signed)
Reach out to the phlebotomy several times in regards to STAT lab orders, no response. Will keep trying. MD aware.

## 2020-03-22 ENCOUNTER — Encounter (HOSPITAL_COMMUNITY): Payer: Self-pay | Admitting: Cardiology

## 2020-03-22 DIAGNOSIS — I5043 Acute on chronic combined systolic (congestive) and diastolic (congestive) heart failure: Secondary | ICD-10-CM | POA: Diagnosis not present

## 2020-03-22 LAB — BASIC METABOLIC PANEL
Anion gap: 12 (ref 5–15)
BUN: 24 mg/dL — ABNORMAL HIGH (ref 6–20)
CO2: 33 mmol/L — ABNORMAL HIGH (ref 22–32)
Calcium: 9.6 mg/dL (ref 8.9–10.3)
Chloride: 93 mmol/L — ABNORMAL LOW (ref 98–111)
Creatinine, Ser: 1.26 mg/dL — ABNORMAL HIGH (ref 0.44–1.00)
GFR, Estimated: 49 mL/min — ABNORMAL LOW (ref >60)
Glucose, Bld: 99 mg/dL (ref 70–99)
Potassium: 3.8 mmol/L (ref 3.5–5.1)
Sodium: 138 mmol/L (ref 135–145)

## 2020-03-22 LAB — CBC WITH DIFFERENTIAL/PLATELET
Abs Immature Granulocytes: 0.05 10*3/uL (ref 0.00–0.07)
Basophils Absolute: 0 10*3/uL (ref 0.0–0.1)
Basophils Relative: 0 %
Eosinophils Absolute: 0.2 10*3/uL (ref 0.0–0.5)
Eosinophils Relative: 2 %
HCT: 38.3 % (ref 36.0–46.0)
Hemoglobin: 11.6 g/dL — ABNORMAL LOW (ref 12.0–15.0)
Immature Granulocytes: 0 %
Lymphocytes Relative: 11 %
Lymphs Abs: 1.3 10*3/uL (ref 0.7–4.0)
MCH: 25.7 pg — ABNORMAL LOW (ref 26.0–34.0)
MCHC: 30.3 g/dL (ref 30.0–36.0)
MCV: 84.9 fL (ref 80.0–100.0)
Monocytes Absolute: 1.3 10*3/uL — ABNORMAL HIGH (ref 0.1–1.0)
Monocytes Relative: 10 %
Neutro Abs: 9.2 10*3/uL — ABNORMAL HIGH (ref 1.7–7.7)
Neutrophils Relative %: 77 %
Platelets: 311 10*3/uL (ref 150–400)
RBC: 4.51 MIL/uL (ref 3.87–5.11)
RDW: 14.6 % (ref 11.5–15.5)
WBC: 12 10*3/uL — ABNORMAL HIGH (ref 4.0–10.5)
nRBC: 0 % (ref 0.0–0.2)

## 2020-03-22 MED ORDER — PREDNISONE 20 MG PO TABS
30.0000 mg | ORAL_TABLET | Freq: Every day | ORAL | Status: DC
  Administered 2020-03-23 – 2020-03-24 (×2): 30 mg via ORAL
  Filled 2020-03-22 (×2): qty 1

## 2020-03-22 NOTE — TOC Progression Note (Addendum)
Transition of Care [facility]) - Progression Note    Patient Details  Name: Lauren Wood MRN: [id number] Date of Birth: October 30, 1960  Transition of Care [facility]) CM/SW [place], [place] Phone Number: 03/22/2020, 1:12 PM  Clinical Narrative:    CSW received notice that pt's family is now agreeable to SNF placement. Arlene (sister/ guardian) stated that [facility] was the first choice followed by [facility] and [employer]. They are open to additional faxout for options. Pt has had both moderna vaccines and a booster. CSW will complete workup, SW will continue to follow.   PASSR went to level 2, additional clinicals faxed in.  Expected Discharge Plan: Skilled Nursing Facility Barriers to Discharge: SNF Pending bed offer  Expected Discharge Plan and Services Expected Discharge Plan: [place] Choice: [place] arrangements for the past 2 months: Single Family Home                                       Social Determinants of Health (SDOH) Interventions    Readmission Risk Interventions No flowsheet data found.

## 2020-03-22 NOTE — Social Work (Signed)
To Whom It May Concern:  Please be advised that the above-named patient will require a short-term nursing home stay - anticipated 30 days or less for rehabilitation and strengthening.  The plan is for return home.  

## 2020-03-22 NOTE — NC FL2 (Signed)
[place] MEDICAID FL2 LEVEL OF CARE SCREENING TOOL     IDENTIFICATION  Patient Name: Lauren Wood Birthdate: 1961/04/28 Sex: female Admission Date (Current Location): 03/16/2020  [facility] and [place] Number:  Herbalist and Address:  The [facility] [place] [address], [place], [place] [address]      Provider Number: [id number]  Attending Physician Name and Address:  Leeanne Rio, MD  Relative Name and Phone Number:  Labrenda Lasky [phone]    Current Level of Care: Hospital Recommended Level of Care: [place] Prior Approval Number:    Date Approved/Denied:   PASRR Number:    Discharge Plan: SNF    Current Diagnoses: Patient Active Problem List   Diagnosis Date Noted  . Nonrheumatic mitral valve regurgitation   . Heart failure ([place]) 03/18/2020  . Acute respiratory failure with hypoxia ([place]) 03/16/2020  . Acute exacerbation of CHF (congestive heart failure) ([place]) 03/16/2020  . Hypoxia   . Pulmonary nodules 01/26/2019  . CHF exacerbation ([place]) 01/25/2019  . Acute on chronic diastolic HF (heart failure) ([place]) 07/01/2018  . Obesity hypoventilation syndrome ([place])   . Intellectual disability   . CHF (congestive heart failure) ([place]) 05/10/2018  . Eustachian tube dysfunction, bilateral 12/07/2017  . Otorrhea of left ear 11/26/2017  . Diverticulosis of colon 11/22/2016  . Restless leg syndrome 09/05/2016  . Myringotomy tube status 02/11/2016  . Impacted cerumen, bilateral 07/07/2015  . Mixed conductive and sensorineural hearing loss of right ear with restricted hearing of left ear 07/07/2015  . Perforation of right tympanic membrane 07/07/2015  . Urinary incontinence 12/24/2014  . Gout of foot 09/26/2013  . Onychomycosis 09/25/2013  . Personal history of colonic polyps 06/24/2013  . Health care maintenance 02/11/2013  . Hearing loss in left ear 02/11/2013  . OSA (obstructive sleep apnea) 12/11/2012  . Borderline  personality disorder ([place]) 05/31/2011  . CKD (chronic kidney disease) stage 3, GFR 30-59 ml/min (HCC) 02/06/2011  . Psychosis ([place]) 12/20/2009  . Morbid obesity ([place]) 08/02/2009  . MENTAL RETARDATION 08/02/2009  . Essential hypertension, benign 08/02/2009  . GERD 08/02/2009  . OSTEOARTHRITIS, MULTIPLE JOINTS 08/02/2009  . SLEEP DISORDER 08/02/2009  . Dyssomnia 08/02/2009    Orientation RESPIRATION BLADDER Height & Weight     Self, Situation, Time, Place  Other (Comment) ([place] 2) External catheter Weight: (!) 339 lb 8.1 oz (154 kg) Height:  5\' 4"  (162.6 cm)  BEHAVIORAL SYMPTOMS/MOOD NEUROLOGICAL BOWEL NUTRITION STATUS      Continent Diet (See discharge summary)  AMBULATORY STATUS COMMUNICATION OF NEEDS Skin   Extensive Assist Verbally Normal                       Personal Care Assistance Level of Assistance  Bathing, Feeding, Dressing Bathing Assistance: Limited assistance Feeding assistance: Independent Dressing Assistance: Maximum assistance     Functional Limitations Info  Sight, Hearing, Speech Sight Info: Impaired Hearing Info: Impaired Speech Info: Adequate    SPECIAL CARE FACTORS FREQUENCY  PT (By licensed PT), OT (By licensed OT)     PT Frequency: 5x week OT Frequency: 5x week            Contractures Contractures Info: Not present    Additional Factors Info  Code Status, Allergies Code Status Info: Full Allergies Info: Enalapril Maleate, Hctz (hydrochlorothiazide), Hyperuricemia with Lasix Lasix (furosemide)           Current Medications (03/22/2020):  This is the current hospital active  medication list Current Facility-Administered Medications  Medication Dose Route Frequency Provider Last Rate Last Admin  . acetaminophen (TYLENOL) tablet 650 mg  650 mg Oral Q6H PRN Lelon Perla, MD   650 mg at 03/21/20 2024  . aspirin chewable tablet 81 mg  81 mg Oral Daily Lelon Perla, MD   81 mg at 03/22/20 0836  . benztropine (COGENTIN) tablet  0.25 mg  0.25 mg Oral BID Lelon Perla, MD   0.25 mg at 03/22/20 0836  . buPROPion (WELLBUTRIN XL) 24 hr tablet 150 mg  150 mg Oral q morning - 10a Lelon Perla, MD   150 mg at 03/22/20 4287  . carvedilol (COREG) tablet 25 mg  25 mg Oral BID WC Chandrasekhar, Mahesh A, MD   25 mg at 03/22/20 0837  . clonazepam (KLONOPIN) disintegrating tablet 0.25 mg  0.25 mg Oral QHS Lelon Perla, MD   0.25 mg at 03/21/20 2011  . enoxaparin (LOVENOX) injection 80 mg  80 mg Subcutaneous Daily Lelon Perla, MD   80 mg at 03/22/20 0840  . FLUoxetine (PROZAC) capsule 40 mg  40 mg Oral q morning - 10a Lelon Perla, MD   40 mg at 03/22/20 0837  . furosemide (LASIX) tablet 80 mg  80 mg Oral Daily Chandrasekhar, Mahesh A, MD   80 mg at 03/22/20 0839  . losartan (COZAAR) tablet 25 mg  25 mg Oral Daily Gifford Shave, MD   25 mg at 03/22/20 0839  . mirtazapine (REMERON) tablet 30 mg  30 mg Oral QHS Lelon Perla, MD   30 mg at 03/21/20 2011  . pantoprazole (PROTONIX) EC tablet 40 mg  40 mg Oral Daily Lelon Perla, MD   40 mg at 03/22/20 0840  . prazosin (MINIPRESS) capsule 1 mg  1 mg Oral QHS Lelon Perla, MD   1 mg at 03/21/20 2012  . pregabalin (LYRICA) capsule 50 mg  50 mg Oral Daily Lelon Perla, MD   50 mg at 03/22/20 0837  . risperiDONE (RISPERDAL) tablet 4 mg  4 mg Oral BID Lelon Perla, MD   4 mg at 03/22/20 6811  . spironolactone (ALDACTONE) tablet 25 mg  25 mg Oral Daily Gifford Shave, MD   25 mg at 03/22/20 5726  . traZODone (DESYREL) tablet 100 mg  100 mg Oral QHS Lelon Perla, MD   100 mg at 03/21/20 2011     Discharge Medications: Please see discharge summary for a list of discharge medications.  Relevant Imaging Results:  Relevant Lab Results:   Additional Information SS# [id number], [place]

## 2020-03-22 NOTE — Progress Notes (Signed)
03/22/20 1013  What Happened  Was fall witnessed? Yes  Who witnessed fall? Jordan Tripp, Mary Shull,   and Harrelson Reene  Patients activity before fall to/from bed, chair, or stretcher  Point of contact buttocks  Was patient injured? Unsure  Follow Up  MD notified Cresenzo V  Time MD notified 1019  Family notified Yes - comment  Time family notified 1039  Additional tests No  Simple treatment Other (comment) (N/a)  Progress note created (see row info) Yes  Adult Fall Risk Assessment  Risk Factor Category (scoring not indicated) Not Applicable  Age 59  Fall History: Fall within 6 months prior to admission 0  Elimination; Bowel and/or Urine Incontinence 0  Elimination; Bowel and/or Urine Urgency/Frequency 2  Medications: includes PCA/Opiates, Anti-convulsants, Anti-hypertensives, Diuretics, Hypnotics, Laxatives, Sedatives, and Psychotropics 5  Patient Care Equipment 1  Mobility-Assistance 2  Mobility-Gait 2  Mobility-Sensory Deficit 0  Altered awareness of immediate physical environment 0  Impulsiveness 0  Lack of understanding of one's physical/cognitive limitations 4 (mentaly deleyed but understands limitations)  Total Score 17  Patient Fall Risk Level High fall risk  Adult Fall Risk Interventions  Required Bundle Interventions *See Row Information* High fall risk - low, moderate, and high requirements implemented  Additional Interventions Use of appropriate toileting equipment (bedpan, BSC, etc.);Family Supervision;PT/OT need assessed if change in mobility from baseline  Screening for Fall Injury Risk (To be completed on HIGH fall risk patients) - Assessing Need for Low Bed  Risk For Fall Injury- Low Bed Criteria Previous fall this admission  Will Implement Low Bed and Floor Mats No - Criteria no longer met for low bed (only mats)  Pain Assessment  Pain Scale 0-10  Pain Score 0  PCA/Epidural/Spinal Assessment  Respiratory Pattern Regular;Unlabored   Neurological  Neuro (WDL) X  Level of Consciousness Alert  Orientation Level Oriented X4  Cognition Appropriate at baseline;Developmentally delayed  Speech Clear  Pupil Assessment  No  Motor Function/Sensation Assessment Grip  R Hand Grip Present;Strong  L Hand Grip Present;Strong  Neuro Symptoms None  Musculoskeletal  Musculoskeletal (WDL) X  Assistive Device BSC  Generalized Weakness Yes  Weight Bearing Restrictions No  Musculoskeletal Details  RLE Limited movement;Weakness  Integumentary  Integumentary (WDL) X  Skin Color Appropriate for ethnicity  Skin Condition Dry  Skin Integrity Intact  Skin Turgor Non-tenting   Patient was transferred from the bed to the recliner by NT and mobility specialist. Chair was locked however during the transfer recliner accidentally moved and since patient was weak and could not stay on her feet for a long time, she was assisted slowly to the floor by staff members for safely reasons. Patient did not hurt anything, no injuries, VS stable. MD notified, no new orders and per sister request who is HPOA and in surgery today, brother Wilbert Womack was called and informed of this event.By using the lift patient was assisted back to the bed.   Will continue to monitor.  

## 2020-03-22 NOTE — Progress Notes (Signed)
Not able to do walking O2 sats with patient. She had an assisted fall while moving from the bed to the chair with staff members. On RA she dropped to 88% at rest. ToO weak and unable to ambulate. MD aware.

## 2020-03-22 NOTE — Progress Notes (Signed)
FPTS Interim Progress Note  Called and spoke with patient's sister and POA, [name] Pillars, who is agreeable for patient to go to SNF, especially given her assisted fall this morning. Will reach out to social work to begin process of SNF placement.   Alcus Dad, MD 03/22/2020, 12:00 PM PGY-1, [place] Medicine Service pager [phone]

## 2020-03-22 NOTE — Progress Notes (Signed)
Family Medicine Teaching Service Daily Progress Note Intern Pager: [phone]  Patient name: Lauren Wood Medical record number: [id number] Date of birth: 10-Aug-1960 Age: 59 y.o. Gender: female  Primary Care Provider: [name], MD Consultants: Cardiology Code Status: FULL  Pt Overview and Major Events to Date:   11/2: admitted 11/3: echo showed severe mitral regurg, cardiology consulted 11/5: TEE confirmed severe mitral regurg with leaflet prolapse  Assessment and Plan:  Lauren Wood Whiteis a 59 y.o.femalewho presented with worsening dyspnea. PMH is significant forHFmrEF, OSA,OHS, HTN, CKD stage 3, GERD, gout,borderline personality disorderand intellectual disability.  Acute Hypoxic Respiratory Failure 2/2 CHF exacerbation Severe Mitral Regurgitation Remains stable on 2L with SpO2 95%. Denies chest pain or SOB.  -Cardiology following, appreciate recommendations -Outpatient f/u with cardiology re: valve repair options -Continue Lasix 80mg  daily -Continue home Spironolactone 25mg  daily -Continue Losartan 25mg  daily -Continue Carvedilol 25mg  BID -Daily BMP -Strict Is/Os, Daily weights -Check ambulatory O2 sats  Hyperkalemia, resolved Patient's K 6.2 yesterday morning although specimen was hemolyzed. Repeat was 4.1 and this morning K is 3.8. -Daily BMP  HTN Patient's BP elevated to 143/102 this morning, but it typically improves to normal levels during day after receiving meds. -Continue Losartan 25mg  daily and Prazosin 1mg  daily  Foot Pain Patient intermittently complains of foot pain. Was initially R foot, which has resolved, but now she complains of L foot discomfort. Likely related to edema and patient not moving much since admission. -Continue to monitor -Tylenol 650 q6h prn -Lyrica 50mg  daily -PT/OT eval and treat  Leukocytosis WBC elevated to 13.2 yesterday. Patient is afebrile without any source of infection. UA unremarkable, and blood cx obtained on  admission show no growth x4 days. -Continue to monitor  CKD Stage 3 Chronic, stable. Cr at her baseline-1.26 this morning. -Daily BMP -Avoid nephrotoxic agents  OSA  OHS Chronic, stable. -CPAP at night  Borderline Personality Disorder, Depression Stable. -Continue home bupropion, fluoxetine, mirtazapine, trazodone,cogentin, and clonazepam  Gout Chronic, stable. -Holding home allopurinol   FEN/GI: Heart healthy diet PPx: Lovenox   Status is: Inpatient Remains inpatient appropriate because:ongoing discharge planning  Dispo: The patient is from: Home              Anticipated d/c is to: Home              Anticipated d/c date is: 1 day              Patient currently is not medically stable to d/c.   Subjective:  Patient complains of L foot discomfort this morning. States the R foot pain she had previously has resolved. Denies chest pain or SOB. No other complaints at this time.   Objective: Temp:  [98.9 F (37.2 C)-99.7 F (37.6 C)] 99.7 F (37.6 C) (11/08 0449) Pulse Rate:  [85-93] 93 (11/08 0449) Resp:  [19-20] 20 (11/08 0449) BP: (111-147)/(70-88) 147/88 (11/08 0449) SpO2:  [92 %-97 %] 93 % (11/08 0449) Weight:  [518 kg] 154 kg (11/08 0449) Physical Exam: General: alert, well-appearing, NAD Cardiovascular: RRR, normal S1/S2 without m/r/g although limited by body habitus Respiratory: normal WOB on 2L, lungs CTAB Abdomen: +BS, soft, nontender, nondistended Extremities: 1+ nonpitting pedal edema bilaterally  Laboratory: Recent Labs  Lab 03/16/20 1038 03/18/20 0301 03/21/20 0258  WBC 11.2* 11.4* 13.2*  HGB 11.2* 11.1* 12.0  HCT 37.7 36.8 38.3  PLT 263 255 266   Recent Labs  Lab 03/20/20 0354 03/21/20 0258 03/21/20 1436  NA 143 137 137  K 3.7  6.2* 4.1  CL 96* 96* 92*  CO2 34* 29 31  BUN 21* 22* 20  CREATININE 1.34* 1.18* 1.11*  CALCIUM 9.5 9.2 9.3  GLUCOSE 106* 101* 109*    Imaging/Diagnostic Tests: No new imaging/diagnostic  tests.  Alcus Dad, MD 03/22/2020, 6:06 AM PGY-1, [place] Intern pager: [phone], text pages welcome

## 2020-03-22 NOTE — Progress Notes (Signed)
Progress Note  Patient Name: Lauren Wood Date of Encounter: 03/22/2020  Primary Cardiologist:   Minus Breeding, MD   Subjective   She denies any SOB.  She is complaining of leg pain and really could not ambulate today.  In fact she had an assisted fall without trauma.    Inpatient Medications    Scheduled Meds: . aspirin  81 mg Oral Daily  . benztropine  0.25 mg Oral BID  . buPROPion  150 mg Oral q morning - 10a  . carvedilol  25 mg Oral BID WC  . clonazepam  0.25 mg Oral QHS  . enoxaparin (LOVENOX) injection  80 mg Subcutaneous Daily  . FLUoxetine  40 mg Oral q morning - 10a  . furosemide  80 mg Oral Daily  . losartan  25 mg Oral Daily  . mirtazapine  30 mg Oral QHS  . pantoprazole  40 mg Oral Daily  . prazosin  1 mg Oral QHS  . pregabalin  50 mg Oral Daily  . risperidone  4 mg Oral BID  . spironolactone  25 mg Oral Daily  . traZODone  100 mg Oral QHS   Continuous Infusions:  PRN Meds: acetaminophen   Vital Signs    Vitals:   03/21/20 1104 03/21/20 2000 03/22/20 0449 03/22/20 0834  BP: 115/70 124/75 (!) 147/88 (!) 143/102  Pulse: 90 85 93 69  Resp: 19 20 20 20   Temp: 99.5 F (37.5 C) 98.9 F (37.2 C) 99.7 F (37.6 C) 99.1 F (37.3 C)  TempSrc: Oral Oral Oral Oral  SpO2: 96% 97% 93% 95%  Weight:   (!) 154 kg   Height:        Intake/Output Summary (Last 24 hours) at 03/22/2020 0939 Last data filed at 03/22/2020 0300 Gross per 24 hour  Intake 267 ml  Output 1600 ml  Net -1333 ml   Filed Weights   03/20/20 0603 03/21/20 0433 03/22/20 0449  Weight: (!) 156.4 kg (!) 156.2 kg (!) 154 kg    Telemetry    NSR with 7 beat run of VT - Personally Reviewed  ECG    NA - Personally Reviewed  Physical Exam   GEN: No acute distress.   Neck: No  JVD Cardiac: RRR, no murmurs, rubs, or gallops.  Respiratory: Clear  to auscultation bilaterally. GI: Soft, nontender, non-distended  MS: No  edema; No deformity. Neuro:  Nonfocal  Psych: Normal affect    Labs    Chemistry Recent Labs  Lab 03/21/20 0258 03/21/20 1436 03/22/20 0501  NA 137 137 138  K 6.2* 4.1 3.8  CL 96* 92* 93*  CO2 29 31 33*  GLUCOSE 101* 109* 99  BUN 22* 20 24*  CREATININE 1.18* 1.11* 1.26*  CALCIUM 9.2 9.3 9.6  GFRNONAA 53* 57* 49*  ANIONGAP 12 14 12      Hematology Recent Labs  Lab 03/16/20 1038 03/18/20 0301 03/21/20 0258  WBC 11.2* 11.4* 13.2*  RBC 4.34 4.21 4.45  HGB 11.2* 11.1* 12.0  HCT 37.7 36.8 38.3  MCV 86.9 87.4 86.1  MCH 25.8* 26.4 27.0  MCHC 29.7* 30.2 31.3  RDW 14.6 14.9 14.8  PLT 263 255 266    Cardiac EnzymesNo results for input(s): TROPONINI in the last 168 hours. No results for input(s): TROPIPOC in the last 168 hours.   BNP Recent Labs  Lab 03/16/20 1038  BNP 232.4*     DDimer No results for input(s): DDIMER in the last 168 hours.   Radiology  No results found.  Cardiac Studies   TEE   1. Moderate global reduction in LV systolic function; moderate LAE; mild  prolapse of anterior MV leaflet with mildly restricted posterior leaflet;  severe MR.  2. Left ventricular ejection fraction, by estimation, is 35 to 40%. The  left ventricle has moderately decreased function. The left ventricle  demonstrates global hypokinesis.  3. Right ventricular systolic function is normal. The right ventricular  size is normal.  4. Left atrial size was moderately dilated. No left atrial/left atrial  appendage thrombus was detected.  5. The mitral valve is abnormal. Severe mitral valve regurgitation.  6. The aortic valve is tricuspid. Aortic valve regurgitation is not  visualized. No aortic stenosis is present.  7. There is mild (Grade II) plaque involving the descending aorta.   Patient Profile     59 y.o. female with a hx of chronic systolic and diastolic heart failure, HTN, CKD stage III, OSA on CPAP, borderline personality disorder, intellectual disabilitywho is being seen  for the evaluation of dyspnea.    Assessment & Plan    ACUTE ON CHRONIC SYSTOLIC AND DIASTOLIC HF:   On PO Lasix.  Net negative 793 yesterday.  Would continue current PO dose.      MR:  Reviewed with Dr. Burt Knack.  She is not a surgical candidate but he would consider her for possible MV clipping as an out patient.   This was not visualized on echo in 2020.  I reviewed the TEE and echo images from 2020.  I will follow her as an outpatient and then consider referral to Dr. Burt Knack based on her functional status post discharge.   HYPERKALEMIA:  Held spiro briefly.  Now back on.   Potassium is normalized.    HTN:  This is being managed in the context of treating his CHF   AKI:  Creat is stable.   For questions or updates, please contact [place] Please consult [email or web address] for contact info under Cardiology/STEMI.   Signed, Minus Breeding, MD  03/22/2020, 9:39 AM

## 2020-03-22 NOTE — Progress Notes (Signed)
  Mobility Specialist Criteria Algorithm Info.  Mobility Team: [facility] elevated:Self regulated Activity: Stood at bedside;Dangled on edge of bed Range of motion: Active;All extremities Level of assistance: Moderate assist, patient does 50-74% (Mod A to EOB/Mod A +2 sit/stand) Assistive device: Front wheel walker;Other (Comment) (Gait Belt) Minutes sitting in chair:  Minutes stood: 2 minutes Minutes ambulated: 0 minutes Distance ambulated (ft): 0 ft Mobility response: Tolerated poorly;RN notified Bed Position: Semi-fowlers  Received pt in bed eager and willing to participate in mobility with anticipation to be discharged soon. Patient required Mod A supine/sit to the EOB. Prior to standing attempt she complained of pain in LLE but could not specify exact location. When palpated pt has no pain but with weight baring patient grimaces in pain. Pt needed multi modal cues and extended time to stand with RW requiring Mod/Max A+2. Initially when patient stood she was unable to clear hips and needed an extra boost to stand. While transferring patient had an assisted fall. All courses of action was in place to prevent fall, +2 assist, gait belt and RW was in place.  While transferring to chair patient lost her balance and quickly tried to sit in recliner chair. The chair was locked down in place and as patient was going to sit, the chair moved due to broken locking mechanism on the wheels of recliner chair. Pt then dropped down to her knees and was unable to stand back up with assistance. She was then assisted to a sitting position on floor and required MaxiMove (lift) to get patient up and back into bed. RN and MD present throughout. VSS. No injuries or complaints of pain.   03/22/2020 10:35 AM

## 2020-03-22 NOTE — Anesthesia Postprocedure Evaluation (Signed)
Anesthesia Post Note  Patient: Lauren Wood  Procedure(s) Performed: TRANSESOPHAGEAL ECHOCARDIOGRAM (TEE) (N/A )     Patient location during evaluation: Endoscopy Anesthesia Type: MAC Level of consciousness: awake and alert Pain management: pain level controlled Vital Signs Assessment: post-procedure vital signs reviewed and stable Respiratory status: spontaneous breathing, nonlabored ventilation, respiratory function stable and patient connected to nasal cannula oxygen Cardiovascular status: blood pressure returned to baseline and stable Postop Assessment: no apparent nausea or vomiting Anesthetic complications: no   No complications documented.  Last Vitals:  Vitals:   03/22/20 1011 03/22/20 1224  BP: 121/82 (!) 138/96  Pulse: 84 85  Resp: 20 18  Temp: 37.3 C 37.3 C  SpO2: 92% 98%    Last Pain:  Vitals:   03/22/20 1224  TempSrc: Oral  PainSc:                  [name]

## 2020-03-22 NOTE — Care Management Important Message (Signed)
Important Message  Patient Details  Name: Lauren Wood MRN: [id number] Date of Birth: 1961-03-30   Medicare Important Message Given:  Yes     Shelda Altes 03/22/2020, 9:43 AM

## 2020-03-22 NOTE — Progress Notes (Signed)
Physical Therapy Treatment Patient Details Name: Lauren Wood MRN: [id number] DOB: [birth year] Today's Date: 03/22/2020    History of Present Illness 59 y.o. female presenting with worsening dyspnea. PMH is significant for HFmrEF, OSA, OHS, HTN, CKD stage 3, GERD, gout, and intellectual disability    PT Comments    Pt fearful of falling after fall this AM. Was not able to attempt standing at bedside. Pt also with bilateral foot pain that has been present prior to fall. Might be gout pain since pt has history of gout. Feet painful to touch. continue to recommend SNF.    Follow Up Recommendations  SNF;Supervision/Assistance - 24 hour     Equipment Recommendations   (defer to post acute rehab)    Recommendations for Other Services       Precautions / Restrictions Precautions Precautions: Fall Restrictions Weight Bearing Restrictions: No    Mobility  Bed Mobility Overal bed mobility: Needs Assistance Bed Mobility: Supine to Sit;Sit to Supine;Rolling Rolling: +2 for physical assistance;Max assist   Supine to sit: +2 for physical assistance;Mod assist Sit to supine: +2 for physical assistance;Max assist   General bed mobility comments: Assist to bring legs to EOB, elevate trunk into sitting and bring hips to EOB.   Transfers                 General transfer comment: Pt fearful of falling after fall this AM and with bil foot pain. Brought Stedy to room but pt's body habitus does not work with the [employer]. Pt too fearful to attempt standing with walker  Ambulation/Gait                 Stairs             Wheelchair Mobility    Modified Rankin (Stroke Patients Only)       Balance Overall balance assessment: Needs assistance Sitting-balance support: Bilateral upper extremity supported;Feet supported Sitting balance-Leahy Scale: Poor                                      Cognition Arousal/Alertness: Awake/alert Behavior During  Therapy: WFL for tasks assessed/performed Overall Cognitive Status: History of cognitive impairments - at baseline                                        Exercises      General Comments General comments (skin integrity, edema, etc.): Pt on 2L of O2      Pertinent Vitals/Pain Pain Assessment: Faces Faces Pain Scale: Hurts even more Pain Location: legs Pain Intervention(s): Limited activity within patient's tolerance;Repositioned;Monitored during session    Home Living                      Prior Function            PT Goals (current goals can now be found in the care plan section) Acute Rehab PT Goals Patient Stated Goal: "i want to get stronger and better and go back to work" Progress towards PT goals: Not progressing toward goals - comment    Frequency    Min 2X/week      PT Plan Current plan remains appropriate    Co-evaluation              AM-PAC PT "6 Clicks"  Mobility   Outcome Measure  Help needed turning from your back to your side while in a flat bed without using bedrails?: Total Help needed moving from lying on your back to sitting on the side of a flat bed without using bedrails?: A Lot Help needed moving to and from a bed to a chair (including a wheelchair)?: Total Help needed standing up from a chair using your arms (e.g., wheelchair or bedside chair)?: Total Help needed to walk in hospital room?: Total Help needed climbing 3-5 steps with a railing? : Total 6 Click Score: 7    End of Session Equipment Utilized During Treatment: Gait belt;Oxygen Activity Tolerance: Patient limited by pain;Other (comment) (fear/anxiety) Patient left: with call bell/phone within reach;in bed;with bed alarm set Nurse Communication: Mobility status PT Visit Diagnosis: Unsteadiness on feet (R26.81);Other abnormalities of gait and mobility (R26.89);Muscle weakness (generalized) (M62.81);Pain Pain - Right/Left:  (bilateral) Pain - part of  body: Ankle and joints of foot;Leg     Time: 1422-1450 PT Time Calculation (min) (ACUTE ONLY): 28 min  Charges:  $Therapeutic Activity: 23-37 mins                     [employer] Pager [phone] Office [place] 03/22/2020, 5:20 PM

## 2020-03-23 DIAGNOSIS — I472 Ventricular tachycardia: Secondary | ICD-10-CM

## 2020-03-23 LAB — BASIC METABOLIC PANEL
Anion gap: 12 (ref 5–15)
BUN: 27 mg/dL — ABNORMAL HIGH (ref 6–20)
CO2: 35 mmol/L — ABNORMAL HIGH (ref 22–32)
Calcium: 9.6 mg/dL (ref 8.9–10.3)
Chloride: 92 mmol/L — ABNORMAL LOW (ref 98–111)
Creatinine, Ser: 1.34 mg/dL — ABNORMAL HIGH (ref 0.44–1.00)
GFR, Estimated: 45 mL/min — ABNORMAL LOW (ref >60)
Glucose, Bld: 96 mg/dL (ref 70–99)
Potassium: 4.1 mmol/L (ref 3.5–5.1)
Sodium: 139 mmol/L (ref 135–145)

## 2020-03-23 LAB — CBC WITH DIFFERENTIAL/PLATELET
Abs Immature Granulocytes: 0.06 10*3/uL (ref 0.00–0.07)
Basophils Absolute: 0 10*3/uL (ref 0.0–0.1)
Basophils Relative: 0 %
Eosinophils Absolute: 0.3 10*3/uL (ref 0.0–0.5)
Eosinophils Relative: 3 %
HCT: 38.2 % (ref 36.0–46.0)
Hemoglobin: 11.5 g/dL — ABNORMAL LOW (ref 12.0–15.0)
Immature Granulocytes: 1 %
Lymphocytes Relative: 17 %
Lymphs Abs: 1.9 10*3/uL (ref 0.7–4.0)
MCH: 26.1 pg (ref 26.0–34.0)
MCHC: 30.1 g/dL (ref 30.0–36.0)
MCV: 86.6 fL (ref 80.0–100.0)
Monocytes Absolute: 1.1 10*3/uL — ABNORMAL HIGH (ref 0.1–1.0)
Monocytes Relative: 10 %
Neutro Abs: 7.9 10*3/uL — ABNORMAL HIGH (ref 1.7–7.7)
Neutrophils Relative %: 69 %
Platelets: 313 10*3/uL (ref 150–400)
RBC: 4.41 MIL/uL (ref 3.87–5.11)
RDW: 14.6 % (ref 11.5–15.5)
WBC: 11.3 10*3/uL — ABNORMAL HIGH (ref 4.0–10.5)
nRBC: 0 % (ref 0.0–0.2)

## 2020-03-23 LAB — CULTURE, BLOOD (ROUTINE X 2)
Culture: NO GROWTH
Culture: NO GROWTH
Special Requests: ADEQUATE
Special Requests: ADEQUATE

## 2020-03-23 LAB — URIC ACID: Uric Acid, Serum: 8.2 mg/dL — ABNORMAL HIGH (ref 2.5–7.1)

## 2020-03-23 LAB — SARS CORONAVIRUS 2 BY RT PCR (HOSPITAL ORDER, PERFORMED IN ~~LOC~~ HOSPITAL LAB): SARS Coronavirus 2: NEGATIVE

## 2020-03-23 NOTE — Progress Notes (Signed)
Family Medicine Teaching Service Daily Progress Note Intern Pager: [phone]  Patient name: Lauren Wood Medical record number: [id number] Date of birth: 08-Feb-1961 Age: 59 y.o. Gender: female  Primary Care Provider: [name], MD Consultants: None Code Status: FULL  Pt Overview and Major Events to Date:  11/2: admitted 11/3: echo showe severe mitral regurg, cardiology consulted 11/4: TEE confirmed severe mitral regurg with anterior leaflet prolapse  Assessment and Plan:  Lauren Foisy Whiteis a 59 y.o.femalewho presentedwith worsening dyspnea. PMH is significant forHFmrEF, OSA,OHS, HTN, CKD stage 3, GERD, gout,borderline personality disorderandintellectual disability.  Acute Hypoxic Respiratory Failure 2/2 CHF exacerbation Severe Mitral Regurgitation Patient denies SOB and remains stable on 2L with SpO2 >92%. -Outpatient cardiology follow up -Per cardiology: not a surgical candidate but would consider MV clipping -Continue home Lasix 80mg  PO and spironolactone 25mg  daily -Continue Losartan 25mg  daily and Carvedilol 25mg  BID -Daily BMP -Strict Is/Os, Daily weights  Left Foot Pain  Gout Patient unable to walk secondary to L foot pain and generalized weakness. Had previously c/o right foot pain-R foot xray on 11/5 showed generalized soft tissue edema but no acute abnormalities. Likely related to gout as patient has hx of gout and we've been holding her home allopurinol. Uric acid level elevated at 8.2 this morning. -Prednisone 30mg  daily x5 days  HTN Patient's BP wnl over past 24h. Most recent BP 139/77. -Continue Losartan 25mg  daily and Prazosin 1mg  daily  Mild leukocytosis WBC 11.3 this morning with neutrophils 7.9. Remains afebrile without obvious source of infection. -Continue to monitor fever curve -Daily CBC -Monitor for clinical signs of infection  CKD Stage 3 Chronic, stable. Cr 1.34 this morning, consistent with her baseline. -Daily BMP  OSA   OHS Chronic, stable. -CPAP at night  Borderline Personality Disorder, Depression Stable. -Continue home bupropion, fluoxetine, mirtazapine, trazodone,cogentin, and clonazepam   FEN/GI: Heart healthy diet PPx: Lovenox   Status is: Inpatient Remains inpatient appropriate because:awaiting SNF placement  Dispo:  Patient From: Home  Planned Disposition: [place]  Expected discharge date: 03/23/20  Medically stable for discharge: Yes   Subjective:  Patient complains of ongoing L foot pain. Denies other complaints at this time. No right foot pain. No chest pain or SOB.  Objective: Temp:  [98.3 F (36.8 C)-99.6 F (37.6 C)] 98.3 F (36.8 C) (11/09 0459) Pulse Rate:  [69-86] 82 (11/09 0459) Resp:  [16-20] 16 (11/08 2021) BP: (121-143)/(77-102) 139/77 (11/09 0459) SpO2:  [92 %-98 %] 97 % (11/09 0459) Weight:  [962 kg] 154 kg (11/09 0008) Physical Exam: General: alert, well-appearing, NAD Cardiovascular: RRR, normal S1/S2 without m/r/g Respiratory: normal WOB on 2L, lungs CTAB Abdomen: +BS, soft, nontender Extremities: trace nonpitting pedal edema bilaterally, bilateral feet nontender to palpation  Laboratory: Recent Labs  Lab 03/21/20 0258 03/22/20 1404 03/23/20 0446  WBC 13.2* 12.0* 11.3*  HGB 12.0 11.6* 11.5*  HCT 38.3 38.3 38.2  PLT 266 311 313   Recent Labs  Lab 03/21/20 1436 03/22/20 0501 03/23/20 0446  NA 137 138 139  K 4.1 3.8 4.1  CL 92* 93* 92*  CO2 31 33* 35*  BUN 20 24* 27*  CREATININE 1.11* 1.26* 1.34*  CALCIUM 9.3 9.6 9.6  GLUCOSE 109* 99 96   Uric acid: 8.2  Imaging/Diagnostic Tests: No new imaging/diagnostic tests  Alcus Dad, MD 03/23/2020, 6:08 AM PGY-1, [place] Intern pager: [phone], text pages welcome

## 2020-03-23 NOTE — Progress Notes (Signed)
Progress Note  Patient Name: Lauren Wood Date of Encounter: 03/23/2020  Primary Cardiologist:   Minus Breeding, MD   Subjective   She thinks her breathing is at baseline.  Her only complaint is her foot pain.   Inpatient Medications    Scheduled Meds: . aspirin  81 mg Oral Daily  . benztropine  0.25 mg Oral BID  . buPROPion  150 mg Oral q morning - 10a  . carvedilol  25 mg Oral BID WC  . clonazepam  0.25 mg Oral QHS  . enoxaparin (LOVENOX) injection  80 mg Subcutaneous Daily  . FLUoxetine  40 mg Oral q morning - 10a  . furosemide  80 mg Oral Daily  . losartan  25 mg Oral Daily  . mirtazapine  30 mg Oral QHS  . pantoprazole  40 mg Oral Daily  . prazosin  1 mg Oral QHS  . predniSONE  30 mg Oral Q breakfast  . pregabalin  50 mg Oral Daily  . risperidone  4 mg Oral BID  . spironolactone  25 mg Oral Daily  . traZODone  100 mg Oral QHS   Continuous Infusions:  PRN Meds: acetaminophen   Vital Signs    Vitals:   03/22/20 2021 03/23/20 0008 03/23/20 0459 03/23/20 0800  BP:   139/77   Pulse: 86  82   Resp: 16   18  Temp:   98.3 F (36.8 C)   TempSrc:   Oral   SpO2: 95%  97% 97%  Weight:  (!) 154 kg    Height:        Intake/Output Summary (Last 24 hours) at 03/23/2020 1044 Last data filed at 03/23/2020 0844 Gross per 24 hour  Intake 594 ml  Output 1100 ml  Net -506 ml   Filed Weights   03/21/20 0433 03/22/20 0449 03/23/20 0008  Weight: (!) 156.2 kg (!) 154 kg (!) 154 kg    Telemetry    NSR with brief run of NSVT.  - Personally Reviewed  ECG    NA - Personally Reviewed  Physical Exam   GEN: No  acute distress.   Neck: No  JVD Cardiac: RRR, no murmurs, rubs, or gallops.  Respiratory: Clear   to auscultation bilaterally. GI: Soft, nontender, non-distended, normal bowel sounds  MS:   No edema; No deformity. Neuro:   Nonfocal  Psych: Oriented and appropriate    Labs    Chemistry Recent Labs  Lab 03/21/20 1436 03/22/20 0501 03/23/20 0446   NA 137 138 139  K 4.1 3.8 4.1  CL 92* 93* 92*  CO2 31 33* 35*  GLUCOSE 109* 99 96  BUN 20 24* 27*  CREATININE 1.11* 1.26* 1.34*  CALCIUM 9.3 9.6 9.6  GFRNONAA 57* 49* 45*  ANIONGAP 14 12 12      Hematology Recent Labs  Lab 03/21/20 0258 03/22/20 1404 03/23/20 0446  WBC 13.2* 12.0* 11.3*  RBC 4.45 4.51 4.41  HGB 12.0 11.6* 11.5*  HCT 38.3 38.3 38.2  MCV 86.1 84.9 86.6  MCH 27.0 25.7* 26.1  MCHC 31.3 30.3 30.1  RDW 14.8 14.6 14.6  PLT 266 311 313    Cardiac EnzymesNo results for input(s): TROPONINI in the last 168 hours. No results for input(s): TROPIPOC in the last 168 hours.   BNP No results for input(s): BNP, PROBNP in the last 168 hours.   DDimer No results for input(s): DDIMER in the last 168 hours.   Radiology    No results found.  Cardiac Studies   TEE   1. Moderate global reduction in LV systolic function; moderate LAE; mild  prolapse of anterior MV leaflet with mildly restricted posterior leaflet;  severe MR.  2. Left ventricular ejection fraction, by estimation, is 35 to 40%. The  left ventricle has moderately decreased function. The left ventricle  demonstrates global hypokinesis.  3. Right ventricular systolic function is normal. The right ventricular  size is normal.  4. Left atrial size was moderately dilated. No left atrial/left atrial  appendage thrombus was detected.  5. The mitral valve is abnormal. Severe mitral valve regurgitation.  6. The aortic valve is tricuspid. Aortic valve regurgitation is not  visualized. No aortic stenosis is present.  7. There is mild (Grade II) plaque involving the descending aorta.   Patient Profile     59 y.o. female with a hx of chronic systolic and diastolic heart failure, HTN, CKD stage III, OSA on CPAP, borderline personality disorder, intellectual disabilitywho is being seen  for the evaluation of dyspnea.   Assessment & Plan    ACUTE ON CHRONIC SYSTOLIC AND DIASTOLIC HF:   On PO Lasix.  Net  negative 626 yesterday.   Overall net negative is 5.5 liters.  Continue current PO dose of Lasix.    NSVT:  Add magnesium to labs.    MR:   Will follow up for possible valve repair as an outpatient.  At this point her very low functional level and morbid obesity would make even MitraClip higher risk.    HTN:  This is being managed in the context of treating his CHF.  BPs at target.  No change in therapy.   AKI:  Creat is stable. No change in therapy.   For questions or updates, please contact [place] Please consult [email or web address] for contact info under Cardiology/STEMI.   Signed, Minus Breeding, MD  03/23/2020, 10:44 AM

## 2020-03-23 NOTE — NC FL2 (Signed)
[place] MEDICAID FL2 LEVEL OF CARE SCREENING TOOL     IDENTIFICATION  Patient Name: Lauren Wood Birthdate: March 25, 1961 Sex: female Admission Date (Current Location): 03/16/2020  [facility] and [place] Number:  Herbalist and Address:  [facility] [place], [place] [address], [place], [place] [address]      Provider Number: [id number]  Attending Physician Name and Address:  Leeanne Rio, MD  Relative Name and Phone Number:  Mackenzee Becvar [phone]    Current Level of Care: Hospital Recommended Level of Care: [place] Prior Approval Number:    Date Approved/Denied:   PASRR Number: [id number]  Discharge Plan: SNF    Current Diagnoses: Patient Active Problem List   Diagnosis Date Noted  . Nonrheumatic mitral valve regurgitation   . Heart failure ([place]) 03/18/2020  . Acute respiratory failure with hypoxia ([place]) 03/16/2020  . Acute exacerbation of CHF (congestive heart failure) ([place]) 03/16/2020  . Hypoxia   . Pulmonary nodules 01/26/2019  . CHF exacerbation ([place]) 01/25/2019  . Acute on chronic diastolic HF (heart failure) ([place]) 07/01/2018  . Obesity hypoventilation syndrome ([place])   . Intellectual disability   . CHF (congestive heart failure) ([place]) 05/10/2018  . Eustachian tube dysfunction, bilateral 12/07/2017  . Otorrhea of left ear 11/26/2017  . Diverticulosis of colon 11/22/2016  . Restless leg syndrome 09/05/2016  . Myringotomy tube status 02/11/2016  . Impacted cerumen, bilateral 07/07/2015  . Mixed conductive and sensorineural hearing loss of right ear with restricted hearing of left ear 07/07/2015  . Perforation of right tympanic membrane 07/07/2015  . Urinary incontinence 12/24/2014  . Gout of foot 09/26/2013  . Onychomycosis 09/25/2013  . Personal history of colonic polyps 06/24/2013  . Health care maintenance 02/11/2013  . Hearing loss in left ear 02/11/2013  . OSA (obstructive sleep apnea) 12/11/2012  .  Borderline personality disorder ([place]) 05/31/2011  . CKD (chronic kidney disease) stage 3, GFR 30-59 ml/min (HCC) 02/06/2011  . Psychosis ([place]) 12/20/2009  . Morbid obesity ([place]) 08/02/2009  . MENTAL RETARDATION 08/02/2009  . Essential hypertension, benign 08/02/2009  . GERD 08/02/2009  . OSTEOARTHRITIS, MULTIPLE JOINTS 08/02/2009  . SLEEP DISORDER 08/02/2009  . Dyssomnia 08/02/2009    Orientation RESPIRATION BLADDER Height & Weight     Self, Situation, Time, Place  Other (Comment) ([place] 2) External catheter Weight: (!) 339 lb 8.1 oz (154 kg) Height:  5\' 4"  (162.6 cm)  BEHAVIORAL SYMPTOMS/MOOD NEUROLOGICAL BOWEL NUTRITION STATUS      Continent Diet (See discharge summary)  AMBULATORY STATUS COMMUNICATION OF NEEDS Skin   Extensive Assist Verbally Normal                       Personal Care Assistance Level of Assistance  Bathing, Feeding, Dressing Bathing Assistance: Limited assistance Feeding assistance: Independent Dressing Assistance: Maximum assistance     Functional Limitations Info  Sight, Hearing, Speech Sight Info: Impaired Hearing Info: Impaired Speech Info: Adequate    SPECIAL CARE FACTORS FREQUENCY  PT (By licensed PT), OT (By licensed OT)     PT Frequency: 5x week OT Frequency: 5x week            Contractures Contractures Info: Not present    Additional Factors Info  Code Status, Allergies Code Status Info: Full Allergies Info: Enalapril Maleate, Hctz (hydrochlorothiazide), Hyperuricemia with Lasix Lasix (furosemide)           Current Medications (03/23/2020):  This is the current hospital active medication  list Current Facility-Administered Medications  Medication Dose Route Frequency Provider Last Rate Last Admin  . acetaminophen (TYLENOL) tablet 650 mg  650 mg Oral Q6H PRN Lelon Perla, MD   650 mg at 03/22/20 1958  . aspirin chewable tablet 81 mg  81 mg Oral Daily Lelon Perla, MD   81 mg at 03/23/20 6468  . benztropine  (COGENTIN) tablet 0.25 mg  0.25 mg Oral BID Lelon Perla, MD   0.25 mg at 03/23/20 0834  . buPROPion (WELLBUTRIN XL) 24 hr tablet 150 mg  150 mg Oral q morning - 10a Lelon Perla, MD   150 mg at 03/23/20 0836  . carvedilol (COREG) tablet 25 mg  25 mg Oral BID WC Chandrasekhar, Mahesh A, MD   25 mg at 03/23/20 0834  . clonazepam (KLONOPIN) disintegrating tablet 0.25 mg  0.25 mg Oral QHS Lelon Perla, MD   0.25 mg at 03/22/20 2000  . enoxaparin (LOVENOX) injection 80 mg  80 mg Subcutaneous Daily Lelon Perla, MD   80 mg at 03/23/20 0321  . FLUoxetine (PROZAC) capsule 40 mg  40 mg Oral q morning - 10a Lelon Perla, MD   40 mg at 03/23/20 0834  . furosemide (LASIX) tablet 80 mg  80 mg Oral Daily Chandrasekhar, Mahesh A, MD   80 mg at 03/23/20 0834  . losartan (COZAAR) tablet 25 mg  25 mg Oral Daily Gifford Shave, MD   25 mg at 03/23/20 2248  . mirtazapine (REMERON) tablet 30 mg  30 mg Oral QHS Lelon Perla, MD   30 mg at 03/22/20 2000  . pantoprazole (PROTONIX) EC tablet 40 mg  40 mg Oral Daily Lelon Perla, MD   40 mg at 03/23/20 0834  . prazosin (MINIPRESS) capsule 1 mg  1 mg Oral QHS Lelon Perla, MD   1 mg at 03/22/20 2000  . predniSONE (DELTASONE) tablet 30 mg  30 mg Oral Q breakfast Leeanne Rio, MD   30 mg at 03/23/20 0834  . pregabalin (LYRICA) capsule 50 mg  50 mg Oral Daily Lelon Perla, MD   50 mg at 03/23/20 0834  . risperiDONE (RISPERDAL) tablet 4 mg  4 mg Oral BID Lelon Perla, MD   4 mg at 03/23/20 1201  . spironolactone (ALDACTONE) tablet 25 mg  25 mg Oral Daily Gifford Shave, MD   25 mg at 03/23/20 0834  . traZODone (DESYREL) tablet 100 mg  100 mg Oral QHS Lelon Perla, MD   100 mg at 03/22/20 2000     Discharge Medications: Please see discharge summary for a list of discharge medications.  Relevant Imaging Results:  Relevant Lab Results:   Additional Information SS# [id number],  [place]

## 2020-03-23 NOTE — Hospital Course (Addendum)
  Lauren Wood is a 59 y.o. female who presented with worsening dyspnea. PMH is significant for HFmrEF, OSA, OHS, HTN, CKD stage 3, GERD, gout, borderline personality disorder and intellectual disability.  Acute on Chronic Systolic and Diastolic HF  Severe Mitral Regurg Patient presented with worsening dyspnea, new O2 requirement, and CXR showing increased vascular congestion. Her BNP was slightly elevated at 232. She was treated with IV Lasix and then transitioned to her home Lasix (80mg  daily) and her home spironolactone. Underlying etiology of exacerbation was uncertain initially (good medication compliance, no dietary changes, EKG and troponins were normal, no signs of infection), so echo was obtained. Echo demonstrated EF 40-45% with global hypokinesis of LV and severe mitral regurg which was new. TEE confirmed these findings and showed mild prolapse of anterior leaflet with mildly restricted posterior leaflet. Cardiology followed the patient throughout her admission and recommended outpatient follow up for possible mitral valve clip (poor overall surgical candidate).   HTN Patient's losartan was increased to 25mg  daily during her admission. Her BP subsequently remained relatively well controlled with systolic BPs in the 902X-115Z.  CKD Stage 3 Creatinine remained stable at her baseline throughout admission.  Gout, Bilateral Foot Pain Patient has a hx of gout with elevated uric acid levels. She takes allopurinol at home, which was held during her admission. She then developed bilateral foot pain during her admission which was likely related to gout, so she was treated with 30mg  PO Prednisone x5 days.  The remainder of patient's chronic medical problems including OSA, OHS, borderline personality disorder, and depression remained stable throughout her admission and she was maintained on her home medications.

## 2020-03-24 DIAGNOSIS — I472 Ventricular tachycardia: Secondary | ICD-10-CM | POA: Diagnosis not present

## 2020-03-24 LAB — BASIC METABOLIC PANEL
Anion gap: 12 (ref 5–15)
BUN: 31 mg/dL — ABNORMAL HIGH (ref 6–20)
CO2: 33 mmol/L — ABNORMAL HIGH (ref 22–32)
Calcium: 9.8 mg/dL (ref 8.9–10.3)
Chloride: 94 mmol/L — ABNORMAL LOW (ref 98–111)
Creatinine, Ser: 1.24 mg/dL — ABNORMAL HIGH (ref 0.44–1.00)
GFR, Estimated: 50 mL/min — ABNORMAL LOW (ref >60)
Glucose, Bld: 91 mg/dL (ref 70–99)
Potassium: 3.9 mmol/L (ref 3.5–5.1)
Sodium: 139 mmol/L (ref 135–145)

## 2020-03-24 LAB — MAGNESIUM: Magnesium: 2.3 mg/dL (ref 1.7–2.4)

## 2020-03-24 MED ORDER — PREDNISONE 10 MG PO TABS
10.0000 mg | ORAL_TABLET | Freq: Once | ORAL | Status: AC
  Administered 2020-03-24: 10 mg via ORAL
  Filled 2020-03-24: qty 1

## 2020-03-24 MED ORDER — PREDNISONE 20 MG PO TABS
40.0000 mg | ORAL_TABLET | Freq: Every day | ORAL | Status: DC
  Administered 2020-03-25: 40 mg via ORAL
  Filled 2020-03-24: qty 2

## 2020-03-24 MED ORDER — METOPROLOL TARTRATE 100 MG PO TABS
100.0000 mg | ORAL_TABLET | Freq: Two times a day (BID) | ORAL | Status: DC
  Administered 2020-03-24 – 2020-03-25 (×3): 100 mg via ORAL
  Filled 2020-03-24 (×3): qty 1

## 2020-03-24 NOTE — TOC Progression Note (Addendum)
Transition of Care [facility]) - Progression Note    Patient Details  Name: RAYELYNN LOYAL MRN: [id number] Date of Birth: 11/25/1960  Transition of Care [facility]) CM/SW [place], [place] Phone Number: 03/24/2020, 9:50 AM  Clinical Narrative:    Update: CSW spoke with Arlene at bedside, presented offers, Arlene chose [facility]. [facility] will have a bed for pt tomorrow.   CSW has tried to get in touch with pt sister [name] via phone [phone] (03/23/20 and 03/24/20) but vm is full. CSW reached out to pt RN to pass along message if Arlene calls to check on pt. CSW called Arlene's house number [phone] and was able to leave a vm. CSW needs to go over current SNF offers with Arlene. CSW will continue to follow for dc needs.    Expected Discharge Plan: Skilled Nursing Facility Barriers to Discharge: SNF Pending bed offer  Expected Discharge Plan and Services Expected Discharge Plan: [place] Choice: [place] arrangements for the past 2 months: Single Family Home                                       Social Determinants of Health (SDOH) Interventions    Readmission Risk Interventions No flowsheet data found.

## 2020-03-24 NOTE — Progress Notes (Addendum)
Progress Note  Patient Name: Lauren Wood Date of Encounter: 03/24/2020  Primary Cardiologist: Minus Breeding, MD  Subjective   Pt has no specific concerns today. She wishes that we contact her sister to update her however RN reports no answer and full voicemail at last call earlier today. No chest pain or SOB  Inpatient Medications    Scheduled Meds: . aspirin  81 mg Oral Daily  . benztropine  0.25 mg Oral BID  . buPROPion  150 mg Oral q morning - 10a  . carvedilol  25 mg Oral BID WC  . clonazepam  0.25 mg Oral QHS  . enoxaparin (LOVENOX) injection  80 mg Subcutaneous Daily  . FLUoxetine  40 mg Oral q morning - 10a  . furosemide  80 mg Oral Daily  . losartan  25 mg Oral Daily  . mirtazapine  30 mg Oral QHS  . pantoprazole  40 mg Oral Daily  . prazosin  1 mg Oral QHS  . predniSONE  30 mg Oral Q breakfast  . pregabalin  50 mg Oral Daily  . risperidone  4 mg Oral BID  . spironolactone  25 mg Oral Daily  . traZODone  100 mg Oral QHS   Continuous Infusions:  PRN Meds: acetaminophen   Vital Signs    Vitals:   03/23/20 1200 03/23/20 2025 03/24/20 0356 03/24/20 0500  BP:  125/73 127/79   Pulse:  82 85   Resp:  18 20   Temp:  98.7 F (37.1 C) 98.8 F (37.1 C)   TempSrc:  Oral Oral   SpO2: 95% 95% 92%   Weight:    (!) 141.9 kg  Height:        Intake/Output Summary (Last 24 hours) at 03/24/2020 0920 Last data filed at 03/24/2020 5053 Gross per 24 hour  Intake 360 ml  Output 1950 ml  Net -1590 ml   Filed Weights   03/22/20 0449 03/23/20 0008 03/24/20 0500  Weight: (!) 154 kg (!) 154 kg (!) 141.9 kg    Physical Exam   General: Overweight,  NAD Skin: Warm, dry, intact  Neck: Negative for carotid bruits. No JVD Lungs: Diminished bilaterally. No wheezes, rales, or rhonchi. Breathing is unlabored. Cardiovascular: RRR with S1 S2. No murmurs Abdomen: Soft, non-tender, non-distended. No obvious abdominal masses. Extremities: No edema. Radial pulses 2+  bilaterally Neuro: Alert and oriented. No focal deficits. No facial asymmetry. MAE spontaneously. Psych: Responds to questions appropriately with normal affect.    Labs    Chemistry Recent Labs  Lab 03/22/20 0501 03/23/20 0446 03/24/20 0344  NA 138 139 139  K 3.8 4.1 3.9  CL 93* 92* 94*  CO2 33* 35* 33*  GLUCOSE 99 96 91  BUN 24* 27* 31*  CREATININE 1.26* 1.34* 1.24*  CALCIUM 9.6 9.6 9.8  GFRNONAA 49* 45* 50*  ANIONGAP 12 12 12      Hematology Recent Labs  Lab 03/21/20 0258 03/22/20 1404 03/23/20 0446  WBC 13.2* 12.0* 11.3*  RBC 4.45 4.51 4.41  HGB 12.0 11.6* 11.5*  HCT 38.3 38.3 38.2  MCV 86.1 84.9 86.6  MCH 27.0 25.7* 26.1  MCHC 31.3 30.3 30.1  RDW 14.8 14.6 14.6  PLT 266 311 313    Cardiac EnzymesNo results for input(s): TROPONINI in the last 168 hours. No results for input(s): TROPIPOC in the last 168 hours.   BNPNo results for input(s): BNP, PROBNP in the last 168 hours.   DDimer No results for input(s): DDIMER  in the last 168 hours.   Radiology    No results found.  Telemetry    03/24/20 NSR with PVCs, HR 80's - Personally Reviewed  ECG    No new tracing as of 03/24/20- Personally Reviewed  Cardiac Studies   TEE 03/19/20:  1. Moderate global reduction in LV systolic function; moderate LAE; mild  prolapse of anterior MV leaflet with mildly restricted posterior leaflet;  severe MR.  2. Left ventricular ejection fraction, by estimation, is 35 to 40%. The  left ventricle has moderately decreased function. The left ventricle  demonstrates global hypokinesis.  3. Right ventricular systolic function is normal. The right ventricular  size is normal.  4. Left atrial size was moderately dilated. No left atrial/left atrial  appendage thrombus was detected.  5. The mitral valve is abnormal. Severe mitral valve regurgitation.  6. The aortic valve is tricuspid. Aortic valve regurgitation is not  visualized. No aortic stenosis is present.  7.  There is mild (Grade II) plaque involving the descending aorta.   Patient Profile     59 y.o. female with a hx of chronic systolic and diastolic heart failure, HTN, CKD stage III, OSA on CPAP, borderline personality disorder, intellectual disabilitywho is being seen for the evaluation of dyspnea.   Assessment & Plan    1. Acute on chronic systolic and diastolic HF: -Weight, 127NT today>>down from 339lb yesterday ??  -I&O, net negative 6.8L -Currently tolerating PO Lasix  -Appears euvolemic on exam   2. NSVT: -Mg+ stable at 2.3 however continues to have PVCs per telemetry review.  -Consider changing to metoprolol tartrate   3. MR: -Per echo with plans to follow OP for possible MVR repair workup  -Not felt to be a MitraClip candidate   4. HTN: -Stable, 127/79>>125/73>>128/93  5. AKI: -Creatinine, 1.24 today    Signed, Kathyrn Drown NP-C [facility] Pager: [phone] 03/24/2020, 9:20 AM     For questions or updates, please contact   Please consult [email or web address] for contact info under Cardiology/STEMI.  History and all data above reviewed.  Patient examined.  I agree with the findings as above.  She thinks that she is breathing better.  She still has left foot painThe patient exam reveals COR:RRR  ,  Lungs: Clear  ,  Abd: Positive bowel sounds, no rebound no guarding, Ext No edema  .  All available labs, radiology testing, previous records reviewed. Agree with documented assessment and plan.  Acute on chronic HF:  Continue diuresis.  NSVT:  I will change to metoprolol to see if there is improvement in this. She is not having symptoms with it.   Jeneen Rinks Cable Fearn  12:49 PM  03/24/2020

## 2020-03-24 NOTE — Progress Notes (Signed)
Occupational Therapy Treatment Patient Details Name: MAANSI WIKE MRN: [id number] DOB: [birth year] Today's Date: 03/24/2020    History of present illness 59 y.o. female presenting with worsening dyspnea. PMH is significant for HFmrEF, OSA, OHS, HTN, CKD stage 3, GERD, gout, and intellectual disability   OT comments  Patient still expressing fear of falling.  OT focused on edge of bed sitting where the patient worked on reaching forward and side to side.  Performed light grooming task and attempted chair pushups from the bed.  She continues to present with increased fear after assisted fall, generalized weakness and balance deficits impacting functional independence.  Plan is for discharge to SNF, and OT will continue to follow in the acute setting.    Follow Up Recommendations  SNF;Supervision/Assistance - 24 hour    Equipment Recommendations  Wheelchair (measurements OT);Wheelchair cushion (measurements OT)    Recommendations for Other Services      Precautions / Restrictions Precautions Precautions: Fall Restrictions Weight Bearing Restrictions: No       Mobility Bed Mobility   Bed Mobility: Supine to Sit;Sit to Supine     Supine to sit: Max assist Sit to supine: Max assist   General bed mobility comments: Assist to bring legs to EOB, elevate trunk into sitting and bring hips to EOB.   Transfers                 General transfer comment: patient able to sit EOB and perform light grooming task..    Balance   Sitting-balance support: Single extremity supported;Feet supported Sitting balance-Leahy Scale: Fair   Postural control: Posterior lean                                                      Cognition Arousal/Alertness: Awake/alert Behavior During Therapy: WFL for tasks assessed/performed Overall Cognitive Status: History of cognitive impairments - at baseline Area of Impairment: Following commands;Problem solving                        Following Commands: Follows one step commands consistently     Problem Solving: Decreased initiation;Slow processing          Exercises     Shoulder Instructions       General Comments      Pertinent Vitals/ Pain       Pain Assessment: Faces Faces Pain Scale: Hurts little more Pain Location: feet Pain Descriptors / Indicators: Tender Pain Intervention(s): Monitored during session;Repositioned                                                          Frequency  Min 2X/week        Progress Toward Goals  OT Goals(current goals can now be found in the care plan section)  Progress towards OT goals: Progressing toward goals  Acute Rehab OT Goals Patient Stated Goal: I'm going to rehab OT Goal Formulation: With patient Time For Goal Achievement: 04/09/20 Potential to Achieve Goals: Good  Plan Discharge plan remains appropriate    Co-evaluation  AM-PAC OT "6 Clicks" Daily Activity     Outcome Measure   Help from another person eating meals?: None Help from another person taking care of personal grooming?: None Help from another person toileting, which includes using toliet, bedpan, or urinal?: A Lot Help from another person bathing (including washing, rinsing, drying)?: A Lot Help from another person to put on and taking off regular upper body clothing?: A Little Help from another person to put on and taking off regular lower body clothing?: Total 6 Click Score: 16    End of Session    OT Visit Diagnosis: Muscle weakness (generalized) (M62.81)   Activity Tolerance Patient tolerated treatment well   Patient Left in bed;with call bell/phone within reach   Nurse Communication Mobility status        Time: 2761-4709 OT Time Calculation (min): 16 min  Charges: OT General Charges $OT Visit: 1 Visit OT Treatments $Therapeutic Activity: 8-22 mins  03/24/2020  Rich, OTR/L  Acute  Rehabilitation Services  Office:  [phone]    Metta Clines 03/24/2020, 10:08 AM

## 2020-03-24 NOTE — Progress Notes (Signed)
PT Cancellation Note  Patient Details Name: Lauren Wood MRN: [id number] DOB: 12/11/1960   Cancelled Treatment:    Reason Eval/Treat Not Completed: Other (comment).  Pt is in bed and declines PT with a considerable explanation about the reasons, not all of which could be understood.  Pt agreed to retry tomorrow, and will do the best possible.  Follow up for goals of acute PT.   Ramond Dial 03/24/2020, 5:13 PM   Mee Hives, PT MS Acute Rehab Dept. Number: [place] and [place]

## 2020-03-24 NOTE — Progress Notes (Signed)
  Mobility Specialist Criteria Algorithm Info.  Mobility Team: [facility] elevated:Self regulated Activity: Transferred:  Bed to chair;Turned to right side;Turned to left side Range of motion: Active;All extremities Level of assistance: Dependent, patient does less than 25% Assistive device: MaxiMove Minutes sitting in chair:  Minutes stood:  Minutes ambulated:  Distance ambulated (ft):  Mobility response: Tolerated well Bed Position: Chair    03/24/2020 2:50 PM

## 2020-03-24 NOTE — Progress Notes (Addendum)
Family Medicine Teaching Service Daily Progress Note Intern Pager: [phone]  Patient name: Lauren Wood Medical record number: [id number] Date of birth: 01-13-1961 Age: 59 y.o. Gender: female  Primary Care Provider: [name], MD Consultants: Cardiology Code Status: FULL  Pt Overview and Major Events to Date:  11/2: admitted 11/3: echo showed severe mitral regurg, cardiology consulted 11/4: TEE confirmed severe mitral regurg with anterior leaflet prolapse 11/8: medically stable for d/c  Assessment and Plan:  Lauren Wood a 59 y.o.femalewhopresentedwith worsening dyspnea. PMH is significant forHFmrEF, OSA,OHS, HTN, CKD stage 3, GERD, gout,borderline personality disorderandintellectual disability.  Acute Hypoxic Respiratory Failure 2/2 CHF exacerbation  Severe Mitral Regurgitation Patient is stable on room air this morning with O2 sats > 92%. She denies breathing complaints. Had 1.9 L of urine output yesterday and is net negative 6.9 L since admission. -Cardiology following, appreciate recommendations -Outpatient follow-up with cardiology re: MV repair options -Continue Lasix 80 mg PO daily -Continue home spironolactone 25 mg daily -Continue losartan 25 mg daily -Continue carvedilol 25 mg twice daily -Strict I's and O's, daily weights -Continue PT/OT  Foot pain  Gout Patient reports ongoing bilateral foot pain, unchanged from prior.  States she is afraid to stand, not because of weakness or pain but because she is afraid of falling. May be related to gout as well as discomfort from edema and not moving much for several days. -Continue prednisone 30mg  daily (day 2/5) for possible gout -PT/OT  Hypertension BP has been well controlled over the past 24h.  Most recent BP 127/79. -Continue losartan 25 mg daily and prazosin 1 mg daily  CKD Stage 3 Chronic, stable. Cr remains at baseline this morning (1.24) -Will continue to monitor  OSA  OHS Chronic,  stable. -CPAP at night  Borderline Personality Disorder, Depression Stable. -Continue home bupropion, fluoxetine, mirtazapine, trazodone,cogentin, and clonazepam  FEN/GI: Heart healthy diet PPx: Lovenox   Status is: Inpatient Remains inpatient appropriate because:Awaiting SNF  Dispo:  Patient From: [place]  Planned Disposition: [place]  Expected discharge date: 03/24/20  Medically stable for discharge: Yes    Subjective:  Patient reports she feels good this morning. Denies breathing complaints. She does complain of ongoing L foot pain and states her R foot is hurting again as well. Patient admits she is afraid of falling and therefore is afraid to stand up. Denies weakness.  Objective: Temp:  [98.7 F (37.1 C)-98.8 F (37.1 C)] 98.8 F (37.1 C) (11/10 0356) Pulse Rate:  [80-85] 85 (11/10 0356) Resp:  [18-20] 20 (11/10 0356) BP: (125-128)/(73-93) 127/79 (11/10 0356) SpO2:  [92 %-97 %] 92 % (11/10 0356) Physical Exam: General: alert, well appearing, NAD Cardiovascular: RRR, normal S1/S2, no M/R/G Respiratory: Normal WOB on room air, lungs CTAB Abdomen: Soft, nontender, nondistended Extremities: 1+ nonpitting pedal edema bilaterally, normal ROM of bilateral feet and ankles, bilateral feet nontender to palpation  Laboratory: Recent Labs  Lab 03/21/20 0258 03/22/20 1404 03/23/20 0446  WBC 13.2* 12.0* 11.3*  HGB 12.0 11.6* 11.5*  HCT 38.3 38.3 38.2  PLT 266 311 313   Recent Labs  Lab 03/22/20 0501 03/23/20 0446 03/24/20 0344  NA 138 139 139  K 3.8 4.1 3.9  CL 93* 92* 94*  CO2 33* 35* 33*  BUN 24* 27* 31*  CREATININE 1.26* 1.34* 1.24*  CALCIUM 9.6 9.6 9.8  GLUCOSE 99 96 91    Imaging/Diagnostic Tests: No new imaging/diagnostic tests  Alcus Dad, MD 03/24/2020, 6:13 AM PGY-1, [place] [facility]  [facility] FPTS Intern pager: [phone], text pages welcome

## 2020-03-24 NOTE — Progress Notes (Signed)
Pt remains A/Ox4, HOH. VSS. Sister came to bedside to meet with social work and Cardiology. Used Lift to assistance to bed to recliner and recliner to bed. Purwick in place; No BM this shift. Safety maintained. Bed lowered and locked, call light in reach and bed alarm activated. Will continue to monitor.

## 2020-03-25 DIAGNOSIS — I5023 Acute on chronic systolic (congestive) heart failure: Secondary | ICD-10-CM

## 2020-03-25 DIAGNOSIS — I5043 Acute on chronic combined systolic (congestive) and diastolic (congestive) heart failure: Secondary | ICD-10-CM | POA: Diagnosis not present

## 2020-03-25 MED ORDER — CLONAZEPAM 0.5 MG PO TABS
0.2500 mg | ORAL_TABLET | Freq: Every day | ORAL | 0 refills | Status: DC | PRN
Start: 2020-03-25 — End: 2020-07-13

## 2020-03-25 MED ORDER — METOPROLOL TARTRATE 100 MG PO TABS
100.0000 mg | ORAL_TABLET | Freq: Two times a day (BID) | ORAL | 0 refills | Status: DC
Start: 2020-03-25 — End: 2020-07-13

## 2020-03-25 MED ORDER — COLCHICINE 0.6 MG PO TABS: ORAL_TABLET | ORAL | 0 refills | Status: DC

## 2020-03-25 MED ORDER — PREDNISONE 20 MG PO TABS: 40.0000 mg | ORAL_TABLET | Freq: Every day | ORAL | 0 refills | Status: AC

## 2020-03-25 MED ORDER — LOSARTAN POTASSIUM 25 MG PO TABS
25.0000 mg | ORAL_TABLET | Freq: Every day | ORAL | 0 refills | Status: DC
Start: 2020-03-25 — End: 2020-07-13

## 2020-03-25 NOTE — Plan of Care (Signed)

## 2020-03-25 NOTE — Progress Notes (Addendum)
Pt remains A/Ox4. VSS. Takes medications whole and tolerated. Purwick remained in place. Discharge paperwork completed; IV removed; Telemetry removed; Discharge ready; Safety maintained. Bed lowered and locked, call light in reach and bed alarm activated. Will continue to monitor until D/C.

## 2020-03-25 NOTE — Progress Notes (Addendum)
Progress Note  Patient Name: Lauren Wood Date of Encounter: 03/25/2020  Primary Cardiologist: Minus Breeding, MD  Subjective   No complaints today. Approved for transfer to SNF>>>to leave today.     Inpatient Medications    Scheduled Meds: . aspirin  81 mg Oral Daily  . benztropine  0.25 mg Oral BID  . buPROPion  150 mg Oral q morning - 10a  . clonazepam  0.25 mg Oral QHS  . enoxaparin (LOVENOX) injection  80 mg Subcutaneous Daily  . FLUoxetine  40 mg Oral q morning - 10a  . furosemide  80 mg Oral Daily  . losartan  25 mg Oral Daily  . metoprolol tartrate  100 mg Oral BID  . mirtazapine  30 mg Oral QHS  . pantoprazole  40 mg Oral Daily  . prazosin  1 mg Oral QHS  . predniSONE  40 mg Oral Q breakfast  . pregabalin  50 mg Oral Daily  . risperidone  4 mg Oral BID  . spironolactone  25 mg Oral Daily  . traZODone  100 mg Oral QHS   Continuous Infusions:  PRN Meds: acetaminophen   Vital Signs    Vitals:   03/24/20 0500 03/24/20 1309 03/24/20 2010 03/25/20 0410  BP:  (!) 158/87 (!) 113/96 126/69  Pulse:  78 80 73  Resp:   18 18  Temp:  98 F (36.7 C) 98.7 F (37.1 C) 98.5 F (36.9 C)  TempSrc:  Oral Oral Oral  SpO2:  100% 92% 95%  Weight: (!) 141.9 kg     Height:        Intake/Output Summary (Last 24 hours) at 03/25/2020 0753 Last data filed at 03/25/2020 0408 Gross per 24 hour  Intake 840 ml  Output 1400 ml  Net -560 ml   Filed Weights   03/22/20 0449 03/23/20 0008 03/24/20 0500  Weight: (!) 154 kg (!) 154 kg (!) 141.9 kg    Physical Exam   General: Overweight, NAD Neck: Negative for carotid bruits. No JVD Lungs:Clear to ausculation bilaterally. No wheezes, rales, or rhonchi. Breathing is unlabored. Cardiovascular: RRR with S1 S2. No murmurs Abdomen: Soft, non-tender, non-distended. No obvious abdominal masses. Extremities: No edema. Radial  pulses 2+ bilaterally Neuro: Alert and oriented to person. No focal deficits. No facial  asymmetry. MAE spontaneously. Psych: Responds to questions appropriately with normal affect.    Labs    Chemistry Recent Labs  Lab 03/22/20 0501 03/23/20 0446 03/24/20 0344  NA 138 139 139  K 3.8 4.1 3.9  CL 93* 92* 94*  CO2 33* 35* 33*  GLUCOSE 99 96 91  BUN 24* 27* 31*  CREATININE 1.26* 1.34* 1.24*  CALCIUM 9.6 9.6 9.8  GFRNONAA 49* 45* 50*  ANIONGAP 12 12 12      Hematology Recent Labs  Lab 03/21/20 0258 03/22/20 1404 03/23/20 0446  WBC 13.2* 12.0* 11.3*  RBC 4.45 4.51 4.41  HGB 12.0 11.6* 11.5*  HCT 38.3 38.3 38.2  MCV 86.1 84.9 86.6  MCH 27.0 25.7* 26.1  MCHC 31.3 30.3 30.1  RDW 14.8 14.6 14.6  PLT 266 311 313    Cardiac EnzymesNo results for input(s): TROPONINI in the last 168 hours. No results for input(s): TROPIPOC in the last 168 hours.   BNPNo results for input(s): BNP, PROBNP in the last 168 hours.   DDimer No results for input(s): DDIMER in the last 168 hours.   Radiology    No results found.  Telemetry  03/25/20 NSR with PVCs intermittently - Personally Reviewed  ECG    No new tracing as of 03/25/20- Personally Reviewed  Cardiac Studies   TEE 03/19/20:  1. Moderate global reduction in LV systolic function; moderate LAE; mild  prolapse of anterior MV leaflet with mildly restricted posterior leaflet;  severe MR.  2. Left ventricular ejection fraction, by estimation, is 35 to 40%. The  left ventricle has moderately decreased function. The left ventricle  demonstrates global hypokinesis.  3. Right ventricular systolic function is normal. The right ventricular  size is normal.  4. Left atrial size was moderately dilated. No left atrial/left atrial  appendage thrombus was detected.  5. The mitral valve is abnormal. Severe mitral valve regurgitation.  6. The aortic valve is tricuspid. Aortic valve regurgitation is not  visualized. No aortic stenosis is present.  7. There is mild (Grade II) plaque involving the descending aorta.     Patient Profile     59 y.o. female with a hx of chronic systolic and diastolic heart failure, HTN, CKD stage III, OSA on CPAP, borderline personality disorder, intellectual disabilitywho is being seen for the evaluation of dyspnea.  Assessment & Plan    1. Acute on chronic systolic and diastolic HF: -Weight, 832PQ 03/24/20>>down from 339lb?? -I&O, net negative 7.4L -Currently tolerating PO Lasix  -Appears euvolemic on exam   2. NSVT: -Mg+ stable at 2.3 however continues to have PVCs per telemetry review.  -Less PVCs today>>continue BB  3. MR: -Per echo with plans to follow OP for possible MVR repair workup  -Not felt to be a MitraClip candidate   4. HTN: -Stable, 126/69>>113/96>>158/87  5. AKI: -Creatinine, 1.24 on 03/24/20    SignedKathyrn Drown NP-C [facility] Pager: [phone] 03/25/2020, 7:53 AM     For questions or updates, please contact   Please consult [email or web address] for contact info under Cardiology/STEMI.  History and all data above reviewed.  Patient examined.  I agree with the findings as above.   No chest pain.  No SOB.  The patient exam reveals COR:RRR  ,  Lungs: Clear  ,  Abd: Positive bowel sounds, no rebound no guarding, Ext No edema  .  All available labs, radiology testing, previous records reviewed. Agree with documented assessment and plan. Acute on chronic diastolic HF:  OK to discharge on meds as listed.  We will arrange follow up.    Jeneen Rinks Kaylee Wombles  11:41 AM  03/25/2020

## 2020-03-25 NOTE — Discharge Summary (Addendum)
Lauren Wood Discharge Summary  Patient name: Lauren Wood Medical record number: [id number] Date of birth: Oct 12, 1960 Age: 59 y.o. Gender: female Date of Admission: 03/16/2020  Date of Discharge: 03/25/2020 Admitting Physician: Gifford Shave, MD  Primary Care Provider: [name], MD Consultants: Cardiology  Indication for Hospitalization: CHF Exacerbation  Discharge Diagnoses/Problem List:  Severe mitral regurgitation Acute on chronic systolic and diastolic HF HTN CKD Stage 3 Gout OSA OHS GERD Borderline personality disorder Depression Intellectual disability  Disposition: SNF  Discharge Condition: Stable, improved  Discharge Exam:  Gen: alert, well-appearing, NAD CV: RRR, normal S1/S2 without m/r/g Resp: normal WOB on room air, exam limited by body habitus but lungs appear CTAB Abd: soft, nontender, nondistended Ext: 1+ nonpitting pedal edema bilaterally, feet nontender to palpation Neuro: grossly intact Psych: pleasant, appropriate mood and affect  Brief Wood Course:   Lauren Wood is a 59 y.o. female who presented with worsening dyspnea. PMH is significant for HFmrEF, OSA, OHS, HTN, CKD stage 3, GERD, gout, borderline personality disorder and intellectual disability.  Acute on Chronic Systolic and Diastolic HF  Severe Mitral Regurg Patient presented with worsening dyspnea, new O2 requirement, and CXR showing increased vascular congestion. Her BNP was slightly elevated at 232. She was treated with IV Lasix and then transitioned to her home Lasix (80mg  daily) and her home spironolactone. Underlying etiology of exacerbation was uncertain initially (good medication compliance, no dietary changes, EKG and troponins were normal, no signs of infection), so echo was obtained. Echo demonstrated EF 40-45% with global hypokinesis of LV and severe mitral regurg which was new. TEE confirmed these findings and showed mild prolapse of  anterior leaflet with mildly restricted posterior leaflet. Cardiology followed the patient throughout her admission and recommended outpatient follow up for possible mitral valve clip (poor overall surgical candidate). They also recommended switching to metoprolol 100mg  BID in place of her carvedilol.  HTN Patient's losartan was increased to 25mg  daily during her admission. Her BP subsequently remained relatively well controlled with systolic BPs in the 967E-938B.  Please see above for medication management.  Gout, Bilateral Foot Pain Patient has a hx of gout with elevated uric acid levels. She takes allopurinol at home, which was held during her admission. She developed bilateral foot pain during her admission which was likely related to gout, so she was treated with 40mg  PO Prednisone x5 days. Upon discharge, patient was given Colchicine 0.3mg  every other day to prevent rebound flare with restarting Allopurinol.  CKD Stage 3 Creatinine remained stable at her baseline throughout admission.  The remainder of patient's chronic medical problems including OSA, OHS, borderline personality disorder, and depression remained stable throughout her admission and she was maintained on her home medications.  Issues for Follow Up:  1. Outpatient cardiology follow up to discuss options for mitral valve repair 2. Outpatient PCP/pharmacy follow up to assess need for Colchicine (started due to new 2020 guidelines, but could consider discontinuing as outpatient)  Significant Procedures: TEE on 11/5  Significant Labs and Imaging:  Recent Labs  Lab 03/21/20 0258 03/22/20 1404 03/23/20 0446  WBC 13.2* 12.0* 11.3*  HGB 12.0 11.6* 11.5*  HCT 38.3 38.3 38.2  PLT 266 311 313   Recent Labs  Lab 03/19/20 0158 03/20/20 0354 03/21/20 0258 03/21/20 0258 03/21/20 1436 03/21/20 1436 03/22/20 0501 03/22/20 0501 03/23/20 0446 03/24/20 0344  NA 141   < > 137  --  137  --  138  --  139 139  K 3.9   < >  6.2*    < > 4.1   < > 3.8   < > 4.1 3.9  CL 96*   < > 96*  --  92*  --  93*  --  92* 94*  CO2 30   < > 29  --  31  --  33*  --  35* 33*  GLUCOSE 110*   < > 101*  --  109*  --  99  --  96 91  BUN 16   < > 22*  --  20  --  24*  --  27* 31*  CREATININE 1.42*   < > 1.18*  --  1.11*  --  1.26*  --  1.34* 1.24*  CALCIUM 9.2   < > 9.2  --  9.3  --  9.6  --  9.6 9.8  MG 2.1  --  2.2  --   --   --   --   --   --  2.3   < > = values in this interval not displayed.    Results/Tests Pending at Time of Discharge: None  Discharge Medications:  Allergies as of 03/25/2020      Reactions   Enalapril Maleate Other (See Comments)   Unknown reaction   Hctz [hydrochlorothiazide] Other (See Comments)   Hyperuricemia with Lasix    Lasix [furosemide] Other (See Comments)   Hyperuricemia with HCTZ       Medication List    STOP taking these medications   carvedilol 25 MG tablet Commonly known as: COREG     TAKE these medications   acetaminophen 500 MG tablet Commonly known as: TYLENOL Take 500 mg by mouth every 6 (six) hours as needed for headache.   allopurinol 100 MG tablet Commonly known as: ZYLOPRIM Take 1 tablet (100 mg total) by mouth daily.   aspirin 81 MG EC tablet Commonly known as: QC LO-DOSE ASPIRIN Take 1 tablet (81 mg total) by mouth daily. Swallow whole.   benztropine 0.5 MG tablet Commonly known as: COGENTIN Take 0.25 mg by mouth 2 (two) times daily.   buPROPion 150 MG 24 hr tablet Commonly known as: WELLBUTRIN XL Take 150 mg by mouth every morning.   clonazePAM 0.5 MG tablet Commonly known as: KLONOPIN Take 0.5 tablets (0.25 mg total) by mouth daily as needed for anxiety. What changed: when to take this   colchicine 0.6 MG tablet Take 0.5 tablet (0.3mg ) by mouth every other day for 3 months   DOK 100 MG capsule Generic drug: docusate sodium TAKE ONE CAPSULE TWICE DAILY AS NEEDED FOR MILD OR MODERATE CONSTIPATION What changed: See the new instructions.   FLUoxetine 40  MG capsule Commonly known as: PROZAC Take 40 mg by mouth every morning.   furosemide 80 MG tablet Commonly known as: LASIX TAKE ONE TABLET DAILY   losartan 25 MG tablet Commonly known as: COZAAR Take 1 tablet (25 mg total) by mouth daily. What changed: how much to take   metoprolol tartrate 100 MG tablet Commonly known as: LOPRESSOR Take 1 tablet (100 mg total) by mouth 2 (two) times daily.   mirtazapine 30 MG tablet Commonly known as: REMERON Take 30 mg by mouth at bedtime.   omeprazole 20 MG capsule Commonly known as: PRILOSEC TAKE ONE CAPSULE EACH DAY What changed: See the new instructions.   prazosin 1 MG capsule Commonly known as: MINIPRESS Take 1 mg by mouth at bedtime.   predniSONE 20 MG tablet Commonly known as: DELTASONE Take 2  tablets (40 mg total) by mouth daily with breakfast for 2 days. Start taking on: March 26, 2020   pregabalin 50 MG capsule Commonly known as: [place] What changed: See the new instructions.   risperidone 4 MG tablet Commonly known as: RISPERDAL Take 4 mg by mouth 2 (two) times daily.   spironolactone 25 MG tablet Commonly known as: ALDACTONE Take 1 tablet (25 mg total) by mouth daily.   traZODone 100 MG tablet Commonly known as: DESYREL Take 100 mg by mouth at bedtime.       Discharge Instructions: Please refer to Patient Instructions section of EMR for full details.  Patient was counseled important signs and symptoms that should prompt return to medical care, changes in medications, dietary instructions, activity restrictions, and follow up appointments.   Follow-Up Appointments:  Contact information for after-discharge care    Destination    [facility] .   Service: Skilled Nursing Contact information: [place] [phone]                 Follow up at [facility] on 04/13/2020. Arrive by 1:15pm.  Follow  up with Cardiology on 04/28/2020 at 9:15am.  Alcus Dad, MD 03/25/2020, 11:45 AM PGY-1, [place] Upper-Level Resident Addendum   I have independently interviewed and examined the patient. I have discussed the above with the original author and agree with their documentation. Please see also any attending notes.   Gifford Shave, MD PGY-2, [facility] 03/25/2020 12:24 PM  [place] Service pager: [phone] (text pages welcome through [place])

## 2020-03-25 NOTE — Care Management Important Message (Signed)
Important Message  Patient Details  Name: Lauren Wood MRN: [id number] Date of Birth: 07/14/1960   Medicare Important Message Given:  Yes     Shelda Altes 03/25/2020, 10:34 AM

## 2020-03-25 NOTE — Progress Notes (Signed)
D/C instructions printed and placed in packet at nurse's station. Tele and IV removed, tolerated well.  

## 2020-03-25 NOTE — Progress Notes (Signed)
Physical Therapy Treatment Patient Details Name: Lauren Wood MRN: [id number] DOB: 1960/09/11 Today's Date: 03/25/2020    History of Present Illness 59 y.o. female presenting with worsening dyspnea. PMH is significant for HFmrEF, OSA, OHS, HTN, CKD stage 3, GERD, gout, and intellectual disability    PT Comments    Patient received sleeping in bed, easily roused to voice, touch. She is agreeable to PT. Initially agrees to get oob to recliner via lift. MD came in room and said she is being discharged today to SNF. Therefore assisted patient up to sitting on side of bed with +2 assist. She declined attempt at standing due to fear. Patient returned to supine with +2 mod/max assist after several minutes sitting. Patient performed rolling in bed to right and left for bed pads to be changed due to being wet. Requires max +2 for rolling. She will continue to benefit from skilled PT to improve strength and functional independence to reduce caregiver burden.          Follow Up Recommendations  SNF;Supervision/Assistance - 24 hour     Equipment Recommendations  None recommended by PT;Other (comment) (TBD)    Recommendations for Other Services       Precautions / Restrictions Precautions Precautions: Fall Restrictions Weight Bearing Restrictions: No    Mobility  Bed Mobility Overal bed mobility: Needs Assistance Bed Mobility: Rolling;Supine to Sit;Sit to Supine Rolling: +2 for physical assistance;Max assist   Supine to sit: +2 for physical assistance;Max assist Sit to supine: +2 for physical assistance;Max assist   General bed mobility comments: Assist to bring legs to EOB, elevate trunk into sitting and bring hips to EOB.   Transfers                 General transfer comment: transfer not attempted this day as she was not comfortable attempting.  Ambulation/Gait             General Gait Details: unable   Stairs             Wheelchair Mobility     Modified Rankin (Stroke Patients Only)       Balance Overall balance assessment: Needs assistance Sitting-balance support: Feet supported;Bilateral upper extremity supported Sitting balance-Leahy Scale: Fair Sitting balance - Comments: requires close supervision                                    Cognition Arousal/Alertness: Awake/alert Behavior During Therapy: WFL for tasks assessed/performed Overall Cognitive Status: History of cognitive impairments - at baseline Area of Impairment: Following commands;Problem solving                       Following Commands: Follows one step commands consistently     Problem Solving: Decreased initiation;Slow processing        Exercises Other Exercises Other Exercises: seated LAQ x 5 reps bilaterally.    General Comments        Pertinent Vitals/Pain Pain Assessment: Faces Faces Pain Scale: Hurts little more Pain Location: feet Pain Descriptors / Indicators: Tender;Sore;Grimacing;Guarding Pain Intervention(s): Monitored during session;Repositioned;Limited activity within patient's tolerance    Home Living                      Prior Function            PT Goals (current goals can now be found in the care plan section)  Acute Rehab PT Goals Patient Stated Goal: I'm going to rehab PT Goal Formulation: With patient Time For Goal Achievement: 04/02/20 Potential to Achieve Goals: Good Progress towards PT goals: Not progressing toward goals - comment (patient fearful)    Frequency    Min 2X/week      PT Plan Current plan remains appropriate    Co-evaluation              AM-PAC PT "6 Clicks" Mobility   Outcome Measure  Help needed turning from your back to your side while in a flat bed without using bedrails?: Total Help needed moving from lying on your back to sitting on the side of a flat bed without using bedrails?: A Lot Help needed moving to and from a bed to a chair  (including a wheelchair)?: Total Help needed standing up from a chair using your arms (e.g., wheelchair or bedside chair)?: Total Help needed to walk in hospital room?: Total Help needed climbing 3-5 steps with a railing? : Total 6 Click Score: 7    End of Session Equipment Utilized During Treatment: Gait belt Activity Tolerance: Patient limited by pain;Other (comment) (fear/anxiety) Patient left: in bed;with call bell/phone within reach;with bed alarm set Nurse Communication: Mobility status PT Visit Diagnosis: Unsteadiness on feet (R26.81);Other abnormalities of gait and mobility (R26.89);Muscle weakness (generalized) (M62.81);Pain;History of falling (Z91.81) Pain - part of body: Ankle and joints of foot;Leg     Time: 1125-1155 PT Time Calculation (min) (ACUTE ONLY): 30 min  Charges:  $Therapeutic Activity: 23-37 mins                     Imanni Burdine, PT, GCS 03/25/20,12:46 PM

## 2020-03-25 NOTE — TOC Transition Note (Signed)
Transition of Care [facility]) - CM/SW Discharge Note   Patient Details  Name: Lauren Wood MRN: [id number] Date of Birth: [birth year]  Transition of Care [facility]) CM/SW Contact:  [address], [place] Phone Number: 03/25/2020, 9:15 AM   Clinical Narrative:    Patient will DC to: [facility] Anticipated DC date: 03/25/20 Family notified: Clarita Crane Transport by: Corey Harold   Per MD patient ready for DC to [place]. RN to call report prior to discharge [phone]. RN, patient, patient's family, and facility notified of DC. Discharge Summary and FL2 sent to facility. DC packet on chart. Ambulance transport requested for patient.   CSW will sign off for now as social work intervention is no longer needed. Please consult [place] again if new needs arise.    Barriers to Discharge: Continued Medical Work up, Ship broker   Patient Goals and CMS Choice Patient states their goals for this hospitalization and ongoing recovery are:: Pt sister/ gaurdian agreeable to SNF placement. CMS Medicare.gov Compare Post Acute Care list provided to:: Patient Represenative (must comment) (Arlene sister/ gaurdian) Choice offered to / list presented to : [facility] POA / Guardian, Sibling  Discharge Placement                       Discharge Plan and Services In-house Referral: Clinical Social Work   Post Acute Care Choice: [place]                               Social Determinants of Health (SDOH) Interventions     Readmission Risk Interventions No flowsheet data found.

## 2020-04-13 ENCOUNTER — Ambulatory Visit: Payer: Medicare Other | Admitting: Family Medicine

## 2020-04-27 ENCOUNTER — Telehealth: Payer: Self-pay | Admitting: Podiatry

## 2020-04-27 NOTE — Progress Notes (Deleted)
Cardiology Clinic Note   Patient Name: Lauren Wood Date of Encounter: 04/27/2020  Primary Care Provider:  [name], MD Primary Cardiologist:  Minus Breeding, MD  Patient Profile    Lauren Wood 59 year old female presents to the clinic today for follow-up evaluation of her chronic systolic and diastolic CHF.  Past Medical History    Past Medical History:  Diagnosis Date  . ALLERGIC RHINITIS 08/02/2009  . Arthritis   . Boil of vulva 11/07/2012  . Borderline personality disorder ([place]) 05/31/2011  . CKD (chronic kidney disease) stage 3, GFR 30-59 ml/min (HCC)    chronic  . Gout of foot 09/26/2013   L foot 09/2013. In the setting of HCTZ 25 mg and lasix 40 mg daily. Stopped HCTZ after decreasing to 12.5 mg. Treated with colchicine.    Marland Kitchen Heart failure (HCC)    chronic  . Heart murmur   . Hip pain 12/03/2012  . HTN (hypertension)   . 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 Obesity   . 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 PRESSURE ULCER OTHER SITE 10/01/2009   hx of  . Sleep apnea    uses cpap, setting of 2  . Unspecified psychosis 12/20/2009   Past Surgical History:  Procedure Laterality Date  . COLONOSCOPY N/A 06/24/2013   Procedure: COLONOSCOPY;  Surgeon: Gatha Mayer, MD;  Location: [facility] ENDOSCOPY;  Service: Endoscopy;  Laterality: N/A;  . COLONOSCOPY WITH PROPOFOL N/A 10/05/2015   Procedure: COLONOSCOPY WITH PROPOFOL;  Surgeon: Gatha Mayer, MD;  Location: [facility] ENDOSCOPY;  Service: Endoscopy;  Laterality: N/A;  . cyst removed  2011   forehead  . TEE WITHOUT CARDIOVERSION N/A 03/19/2020   Procedure: TRANSESOPHAGEAL ECHOCARDIOGRAM (TEE);  Surgeon: Lelon Perla, MD;  Location: [facility] ENDOSCOPY;  Service: Cardiovascular;  Laterality: N/A;  . TYMPANOSTOMY TUBE PLACEMENT Bilateral yrs ago     Allergies  Allergies  Allergen Reactions  . Enalapril Maleate Other (See Comments)    Unknown reaction  . Hctz [Hydrochlorothiazide] Other (See Comments)    Hyperuricemia with Lasix   . Lasix [Furosemide] Other (See Comments)    Hyperuricemia with HCTZ      History of Present Illness    [name] has a PMH of chronic systolic and diastolic CHF, hypertension, CKD stage III, obstructive sleep apnea on CPAP, borderline personality disorder, and intellectual disability.  She was last seen by Dr. Percival Spanish on 03/25/2020 while admitted for an episode of acute on chronic systolic and diastolic CHF.  Her weight had increased from 312 pounds up to 339 pounds.  She was admitted to the hospital 03/16/2020 until 03/25/2020.  She received IV diuresis.  Her TEE showed an LVEF of 35-40%, moderate LAE, mild prolapse of anterior mitral valve, severe MR, moderately dilated left atria, and descending aortic plaque.  She was not felt to be a good candidate for MitraClip.  She was discharged to SNF in stable condition.  She presents to the clinic today for follow-up evaluation states***  *** denies chest pain, shortness of breath, lower extremity edema, fatigue, palpitations, melena, hematuria, hemoptysis, diaphoresis, weakness, presyncope, syncope, orthopnea, and PND.  Home Medications    Prior to Admission medications   Medication Sig Start Date End Date Taking? Authorizing Provider  acetaminophen (TYLENOL) 500 MG tablet Take 500 mg  by mouth every 6 (six) hours as needed for headache.     [provider]  allopurinol (ZYLOPRIM) 100 MG tablet Take 1 tablet (100 mg total) by mouth daily. 02/11/20   [name], MD  aspirin (QC LO-DOSE ASPIRIN) 81 MG EC tablet Take 1 tablet (81 mg total) by mouth daily. Swallow whole. 11/23/16   Smiley Houseman, MD  benztropine (COGENTIN) 0.5 MG tablet Take 0.25 mg by mouth 2 (two) times daily.    [provider]  buPROPion (WELLBUTRIN XL) 150 MG  24 hr tablet Take 150 mg by mouth every morning.    [provider]  clonazePAM (KLONOPIN) 0.5 MG tablet Take 0.5 tablets (0.25 mg total) by mouth daily as needed for anxiety. 03/25/20   Alcus Dad, MD  colchicine 0.6 MG tablet Take 0.5 tablet (0.3mg ) by mouth every other day for 3 months 03/25/20   Alcus Dad, MD  DOK 100 MG capsule TAKE ONE CAPSULE TWICE DAILY AS NEEDED FOR MILD OR MODERATE CONSTIPATION Patient taking differently: Take 200 mg by mouth daily.  02/26/17   Smiley Houseman, MD  FLUoxetine (PROZAC) 40 MG capsule Take 40 mg by mouth every morning.    [provider]  furosemide (LASIX) 80 MG tablet TAKE ONE TABLET DAILY Patient taking differently: Take 80 mg by mouth daily.  02/11/20   [name], MD  losartan (COZAAR) 25 MG tablet Take 1 tablet (25 mg total) by mouth daily. 03/25/20   Alcus Dad, MD  metoprolol tartrate (LOPRESSOR) 100 MG tablet Take 1 tablet (100 mg total) by mouth 2 (two) times daily. 03/25/20   Alcus Dad, MD  mirtazapine (REMERON) 30 MG tablet Take 30 mg by mouth at bedtime.  04/11/13   [provider]  omeprazole (PRILOSEC) 20 MG capsule TAKE ONE CAPSULE EACH DAY Patient taking differently: Take 20 mg by mouth daily.  11/24/19   [name], MD  prazosin (MINIPRESS) 1 MG capsule Take 1 mg by mouth at bedtime.  11/03/14   [provider]  pregabalin (LYRICA) 50 MG capsule TAKE ONE CAPSULE EACH DAY Patient taking differently: Take 50 mg by mouth daily.  11/03/19   [name], MD  risperidone (RISPERDAL) 4 MG tablet Take 4 mg by mouth 2 (two) times daily.  04/26/13   [provider]  spironolactone (ALDACTONE) 25 MG tablet Take 1 tablet (25 mg total) by mouth daily. 10/31/19   [name], MD  traZODone (DESYREL) 100 MG tablet Take 100 mg by mouth at bedtime.  07/14/12   [provider]  omeprazole (PRILOSEC) 20 MG capsule TAKE ONE CAPSULE EACH DAY 07/30/19   [name], MD     Family History    Family History  Problem Relation Age of Onset  . Colon cancer Father        around age 4  . Diabetes Mother   . Esophageal cancer Neg Hx   . Pancreatic cancer Neg Hx   . Stomach cancer Neg Hx   . Liver disease Neg Hx    She indicated that her mother is deceased. She indicated that her father is deceased. She indicated that her sister is alive. She indicated that her brother is alive. She indicated that the status of her neg hx is unknown.  Social History    Social History   Socioeconomic History  . Marital status: Single    Spouse name: Not on file  . Number of children: 0  . Years of education: Not on  file  . Highest education level: Not on file  Occupational History  . Occupation: Disabled  Tobacco Use  . Smoking status: Never Smoker  . Smokeless tobacco: Never Used  Substance and Sexual Activity  . Alcohol use: No    Alcohol/week: 0.0 standard drinks  . Drug use: No  . Sexual activity: Never  Other Topics Concern  . Not on file  Social History Narrative   Goes to adult daycare.  Lives with sister.           Social Determinants of Health   Financial Resource Strain: Not on file  Food Insecurity: Not on file  Transportation Needs: Not on file  Physical Activity: Not on file  Stress: Not on file  Social Connections: Not on file  Intimate Partner Violence: Not on file     Review of Systems    General:  No chills, fever, night sweats or weight changes.  Cardiovascular:  No chest pain, dyspnea on exertion, edema, orthopnea, palpitations, paroxysmal nocturnal dyspnea. Dermatological: No rash, lesions/masses Respiratory: No cough, dyspnea Urologic: No hematuria, dysuria Abdominal:   No nausea, vomiting, diarrhea, bright red blood per rectum, melena, or hematemesis Neurologic:  No visual changes, wkns, changes in mental status. All other systems reviewed and are otherwise negative except as noted above.  Physical Exam    VS:  LMP  05/08/2013  , BMI There is no height or weight on file to calculate BMI. GEN: Well nourished, well developed, in no acute distress. HEENT: normal. Neck: Supple, no JVD, carotid bruits, or masses. Cardiac: RRR, no murmurs, rubs, or gallops. No clubbing, cyanosis, edema.  Radials/DP/PT 2+ and equal bilaterally.  Respiratory:  Respirations regular and unlabored, clear to auscultation bilaterally. GI: Soft, nontender, nondistended, BS + x 4. MS: no deformity or atrophy. Skin: warm and dry, no rash. Neuro:  Strength and sensation are intact. Psych: Normal affect.  Accessory Clinical Findings    Recent Labs: 02/11/2020: TSH 2.850 03/16/2020: B Natriuretic Peptide 232.4 03/23/2020: Hemoglobin 11.5; Platelets 313 03/24/2020: BUN 31; Creatinine, Ser 1.24; Magnesium 2.3; Potassium 3.9; Sodium 139   Recent Lipid Panel    Component Value Date/Time   CHOL 117 05/11/2018 0310   CHOL 122 07/12/2017 0917   TRIG 37 05/11/2018 0310   HDL 37 (L) 05/11/2018 0310   HDL 44 07/12/2017 0917   CHOLHDL 3.2 05/11/2018 0310   VLDL 7 05/11/2018 0310   LDLCALC 73 05/11/2018 0310   LDLCALC 66 07/12/2017 0917    ECG personally reviewed by me today- *** - No acute changes  Echocardiogram 03/19/2020 IMPRESSIONS    1. Moderate global reduction in LV systolic function; moderate LAE; mild  prolapse of anterior MV leaflet with mildly restricted posterior leaflet;  severe MR.  2. Left ventricular ejection fraction, by estimation, is 35 to 40%. The  left ventricle has moderately decreased function. The left ventricle  demonstrates global hypokinesis.  3. Right ventricular systolic function is normal. The right ventricular  size is normal.  4. Left atrial size was moderately dilated. No left atrial/left atrial  appendage thrombus was detected.  5. The mitral valve is abnormal. Severe mitral valve regurgitation.  6. The aortic valve is tricuspid. Aortic valve regurgitation is not  visualized. No aortic  stenosis is present.  7. There is mild (Grade II) plaque involving the descending aorta.   Assessment & Plan   1.  Acute on chronic systolic and diastolic CHF-euvolemic today.  No increased DOE or activity intolerance.  Weight today***.  Weight at discharge 312 pounds. Continue furosemide, losartan, metoprolol, spironolactone Heart healthy low-sodium diet-salty 6 given Increase physical activity as tolerated  Essential hypertension-BP today***.  Well-controlled at SNF Continue losartan, metoprolol, spironolactone, furosemide Heart healthy low-sodium diet-salty 6 given Increase physical activity as tolerated  Mitral regurgitation-most recent echocardiogram showed severe MR.  Not a candidate for MitraClip. Continue furosemide, metoprolol Heart healthy low-sodium diet-salty 6 given Increase physical activity as tolerated  NSVT-no further episodes of accelerated heartbeat or feeling of extra beats.  Denies presyncope, dizziness and falls.  Reports compliance with CPAP Continue metoprolol Continue to monitor  Disposition: Follow-up with Dr. Percival Spanish in 6 months.  Jossie Ng. Lareina Espino NP-C    04/27/2020, 6:40 AM [place] [address] Office [phone] Fax [phone]  Notice: This dictation was prepared with Dragon dictation along with smaller phrase technology. Any transcriptional errors that result from this process are unintentional and may not be corrected upon review.

## 2020-04-27 NOTE — Telephone Encounter (Signed)
Patient is currently in hospital unable to walk, rehabilitation

## 2020-04-28 ENCOUNTER — Ambulatory Visit: Payer: Medicare Other | Admitting: General Practice

## 2020-04-30 ENCOUNTER — Ambulatory Visit: Payer: Medicare Other | Admitting: Podiatry

## 2020-05-12 ENCOUNTER — Ambulatory Visit: Payer: Medicare Other | Admitting: Podiatry

## 2020-05-31 ENCOUNTER — Ambulatory Visit: Payer: Medicare Other | Admitting: General Practice

## 2020-06-16 ENCOUNTER — Ambulatory Visit: Payer: Medicare Other | Admitting: Podiatry

## 2020-06-17 ENCOUNTER — Ambulatory Visit: Payer: Medicare Other | Admitting: Family Medicine

## 2020-06-17 NOTE — Progress Notes (Addendum)
     SUBJECTIVE:   CHIEF COMPLAINT / HPI:   Lauren Wood is a 60 y.o. female presents for follow up following rehab  Sister present at visit.  Rehab follow up Patient admitted to SNF between 11th Nov and 28th Jan. Presents today for follow up. Sister reports being very sleepy since leaving facility. Sister did not bring meds to the clinic with her, is not taking 3 of her medications.   Schizophrenia  Sees Dr Reece Levy for psychiatry. Last appointment was televist last month..  Hypertension: Patient's current antihypertensive medications include: Losartan 25mg . Compliant with medications and tolerating well without side effects.  Denies any SOB, CP, vision changes, LE edema, medication SEs, or symptoms of hypotension.   Most recent creatinine trend:  Lab Results  Component Value Date   CREATININE 1.24 (H) 03/24/2020   CREATININE 1.34 (H) 03/23/2020   CREATININE 1.26 (H) 03/22/2020     Patient has had a BMP in the past 1 year.  Acute on Chronic Systolic and Diastolic HF, Severe Mitral Regurg Admitted to hospital for CHF exacerbation in Nov 21. Echo 40-45%. TEE in- mild prolapse of anterior leaflet with mildly restricted posterior leaflet. Pt satting at 80-85% when she arrived to the clinic without oxygen. Placed on 2L of oxygen. She is now on baseline oxygen at home since being discharge from SNF, requiring 2L. Takes metoprolol 100mg  BID and lasix 80 mg daily. Has not yet followed up with cardiology yet, has appointment next week.    PERTINENT  PMH / PSH: HFmrEF, OSA, OHS, HTN, CKD stage 3, GERD, gout, and intellectual disability  OBJECTIVE:   BP 136/78   Pulse 81   Ht 5\' 4"  (1.626 m)   Wt (!) 345 lb (156.5 kg)   LMP 05/08/2013   SpO2 93% Comment: @2L   BMI 59.22 kg/m    General: Alert, no acute distress, well appearing, pleasant Cardio: Normal S1 and S2, RRR, no r/m/g Pulm: CTAB, normal work of breathing Abdomen: Bowel sounds normal. Abdomen soft and non-tender.   Neuro: Cranial nerves grossly intact   ASSESSMENT/PLAN:   CHF (congestive heart failure) (HCC) HFrEF and severe mitral regurgitation. Pt will follow up with cardiology next week. Continue lasix 80mg  and metoprolol 100mg  BID. Placed order for DME home oxygen.  Essential hypertension, benign BP at goal, continue losartan 25 mg, metoprolol 100 mg twice daily and Lasix 80 mg daily.  Morbid obesity ([place]) Discussed with sister starting GLP-1 agonist for weight loss. F/u in 1 month where we can discuss this further.   Psychosis Encouraged follow up with Dr Reece Levy in the next month. Asked sister to bring in discharge summary from SNF as there have been medication changes to [name]'s meds.     [name], MD PGY-2 [place]

## 2020-06-18 ENCOUNTER — Other Ambulatory Visit: Payer: Self-pay

## 2020-06-18 ENCOUNTER — Ambulatory Visit (INDEPENDENT_AMBULATORY_CARE_PROVIDER_SITE_OTHER): Payer: Medicare Other | Admitting: Family Medicine

## 2020-06-18 ENCOUNTER — Encounter: Payer: Self-pay | Admitting: Family Medicine

## 2020-06-18 VITALS — BP 136/78 | HR 81 | Ht 64.0 in | Wt 345.0 lb

## 2020-06-18 DIAGNOSIS — I10 Essential (primary) hypertension: Secondary | ICD-10-CM

## 2020-06-18 DIAGNOSIS — F29 Unspecified psychosis not due to a substance or known physiological condition: Secondary | ICD-10-CM | POA: Diagnosis not present

## 2020-06-18 DIAGNOSIS — I5032 Chronic diastolic (congestive) heart failure: Secondary | ICD-10-CM | POA: Diagnosis present

## 2020-06-18 NOTE — Patient Instructions (Signed)
Thank you for coming to see me today. It was a pleasure. Today we discussed her blood pressure. Continue the same medicine.  Please follow up with psychiatry and cardiology.  Let me know about her medications and if you have any issues getting a portable oxygen concentrator.  Please follow-up with me in 4 weeks. We will discuss starting her on a medicine for weight loss.   If you have any questions or concerns, please do not hesitate to call the office at [phone].  If you develop fevers>100.5, shortness of breath, chest pain, palpitations, dizziness, abdominal pain, nausea, vomiting, diarrhea or cannot eat or drink then please go to the ER immediately.  Best wishes,   Dr Posey Pronto

## 2020-06-18 NOTE — Assessment & Plan Note (Addendum)
Encouraged follow up with Dr Reece Levy in the next month. Asked sister to bring in discharge summary from SNF as there have been medication changes to Lauren Wood's meds.

## 2020-06-18 NOTE — Assessment & Plan Note (Signed)
BP at goal, continue losartan 25 mg, metoprolol 100 mg twice daily and Lasix 80 mg daily.

## 2020-06-18 NOTE — Assessment & Plan Note (Signed)
Discussed with sister starting GLP-1 agonist for weight loss. F/u in 1 month where we can discuss this further.

## 2020-06-18 NOTE — Assessment & Plan Note (Addendum)
HFrEF and severe mitral regurgitation. Pt will follow up with cardiology next week. Continue lasix 80mg  and metoprolol 100mg  BID. Placed order for DME home oxygen.

## 2020-06-23 NOTE — Progress Notes (Signed)
Cardiology Clinic Note   Patient Name: EMSLEE LOPEZMARTINEZ Date of Encounter: 06/25/2020  Primary Care Provider:  [name], MD Primary Cardiologist:  Lauren Breeding, MD  Patient Profile    Lauren Wood 60 year old female presents the clinic today for follow-up evaluation of her hypertension and CHF.  Past Medical History    Past Medical History:  Diagnosis Date  . ALLERGIC RHINITIS 08/02/2009  . Arthritis   . Boil of vulva 11/07/2012  . Borderline personality disorder ([place]) 05/31/2011  . CKD (chronic kidney disease) stage 3, GFR 30-59 ml/min (HCC)    chronic  . Gout of foot 09/26/2013   L foot 09/2013. In the setting of HCTZ 25 mg and lasix 40 mg daily. Stopped HCTZ after decreasing to 12.5 mg. Treated with colchicine.    Marland Kitchen Heart failure (HCC)    chronic  . Heart murmur   . Hip pain 12/03/2012  . HTN (hypertension)   . 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 Obesity   . 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 PRESSURE ULCER OTHER SITE 10/01/2009   hx of  . Sleep apnea    uses cpap, setting of 2  . Unspecified psychosis 12/20/2009   Past Surgical History:  Procedure Laterality Date  . COLONOSCOPY N/A 06/24/2013   Procedure: COLONOSCOPY;  Surgeon: Lauren Mayer, MD;  Location: [facility] ENDOSCOPY;  Service: Endoscopy;  Laterality: N/A;  . COLONOSCOPY WITH PROPOFOL N/A 10/05/2015   Procedure: COLONOSCOPY WITH PROPOFOL;  Surgeon: Lauren Mayer, MD;  Location: [facility] ENDOSCOPY;  Service: Endoscopy;  Laterality: N/A;  . cyst removed  2011   forehead  . TEE WITHOUT CARDIOVERSION N/A 03/19/2020   Procedure: TRANSESOPHAGEAL ECHOCARDIOGRAM (TEE);  Surgeon: Lauren Perla, MD;  Location: [facility] ENDOSCOPY;  Service: Cardiovascular;  Laterality: N/A;  . TYMPANOSTOMY TUBE PLACEMENT Bilateral yrs ago    Allergies  Allergies  Allergen  Reactions  . Enalapril Maleate Other (See Comments)    Unknown reaction  . Hctz [Hydrochlorothiazide] Other (See Comments)    Hyperuricemia with Lasix   . Lasix [Furosemide] Other (See Comments)    Hyperuricemia with HCTZ      History of Present Illness    Lauren Wood has a PMH of essential hypertension, chronic systolic and diastolic CHF, hypertension, borderline personality disorder, intellectual disability, nonrheumatic mitral valve regurgitation, OSA, acute respiratory failure, GERD, CKD stage III, morbid obesity, and RLS.  She was admitted to the hospital 03/16/2020 and discharged on 03/25/2020 she presented to the ER with dyspnea.  She was found to be tachypneic and hypoxic at 77%.  She had received her COVID booster 2 weeks prior.  Her oxygenation improved with 2 L nasal cannula.  A CXR was unremarkable for vascular congestion.  Her BNP was mildly elevated at 232, creatinine 1.36 at baseline, and she was hypertensive with systolics in the 017-793 range.  Her EKG showed no changes with a known left bundle branch block.  During her evaluation she reported that she felt stiff due to limited activity restrictions.  She denied increased work of breathing or weight gain.  She reported that her shortness of breath had resolved and she had no further complaints.  She presents the clinic today for follow-up evaluation states she feels well.  She has left skilled nursing and is back  home with her sister.  Her sister reports that she has not been very active.  She will get up, ambulate to the bathroom, ambulate back to her chair, and sit down.  She reports that she does not do any further physical activity.  She reports that she does have increased work of breathing with increased physical activity.  In the office today her oxygen saturation is 98% on 1 L.  We reviewed weaning her oxygen over a period of time as long as her oxygen saturation stays in the mid to high 90s.  Her sister expressed understanding.   We discussed the MitraClip procedure/surgery.  At this time she is not very physically active and morbidly obese at 346 pounds.  We will continue to evaluate her over the next 9 to 12 months and if she is able to lose weight and shows interest in becoming more physically active we will refer her to structural heart team for further evaluation.  Her sister agrees with recommendations.  I will give her the salty 6 diet sheet, have her increase her physical activity as tolerated, slowly wean oxygen at home over the course of the next few weeks, and have her follow-up with Lauren Wood in 3 months.  Today she denies chest pain, shortness of breath, lower extremity edema, fatigue, palpitations, melena, hematuria, hemoptysis, diaphoresis, weakness, presyncope, syncope, orthopnea, and PND.   Home Medications    Prior to Admission medications   Medication Sig Start Date End Date Taking? Authorizing Provider  acetaminophen (TYLENOL) 500 MG tablet Take 500 mg by mouth every 6 (six) hours as needed for headache.     [provider]  allopurinol (ZYLOPRIM) 100 MG tablet Take 1 tablet (100 mg total) by mouth daily. 02/11/20   Lauren Haw, MD  aspirin (QC LO-DOSE ASPIRIN) 81 MG EC tablet Take 1 tablet (81 mg total) by mouth daily. Swallow whole. 11/23/16   Lauren Houseman, MD  benztropine (COGENTIN) 0.5 MG tablet Take 0.25 mg by mouth 2 (two) times daily.    [provider]  buPROPion (WELLBUTRIN XL) 150 MG 24 hr tablet Take 150 mg by mouth every morning.    [provider]  clonazePAM (KLONOPIN) 0.5 MG tablet Take 0.5 tablets (0.25 mg total) by mouth daily as needed for anxiety. 03/25/20   Lauren Dad, MD  colchicine 0.6 MG tablet Take 0.5 tablet (0.3mg ) by mouth every other day for 3 months 03/25/20   Lauren Dad, MD  DOK 100 MG capsule TAKE ONE CAPSULE TWICE DAILY AS NEEDED FOR MILD OR MODERATE CONSTIPATION Patient taking differently: Take 200 mg by mouth daily.   02/26/17   Lauren Houseman, MD  FLUoxetine (PROZAC) 40 MG capsule Take 40 mg by mouth every morning.    [provider]  furosemide (LASIX) 80 MG tablet TAKE ONE TABLET DAILY Patient taking differently: Take 80 mg by mouth daily.  02/11/20   Lauren Haw, MD  losartan (COZAAR) 25 MG tablet Take 1 tablet (25 mg total) by mouth daily. 03/25/20   Lauren Dad, MD  metoprolol tartrate (LOPRESSOR) 100 MG tablet Take 1 tablet (100 mg total) by mouth 2 (two) times daily. 03/25/20   Lauren Dad, MD  mirtazapine (REMERON) 30 MG tablet Take 30 mg by mouth at bedtime.  04/11/13   [provider]  omeprazole (PRILOSEC) 20 MG capsule TAKE ONE CAPSULE EACH DAY Patient taking differently: Take 20 mg by mouth daily.  11/24/19   Lauren Haw, MD  prazosin (MINIPRESS) 1  MG capsule Take 1 mg by mouth at bedtime.  11/03/14   [provider]  pregabalin (LYRICA) 50 MG capsule TAKE ONE CAPSULE EACH DAY Patient taking differently: Take 50 mg by mouth daily.  11/03/19   [name], MD  risperidone (RISPERDAL) 4 MG tablet Take 4 mg by mouth 2 (two) times daily.  04/26/13   [provider]  spironolactone (ALDACTONE) 25 MG tablet Take 1 tablet (25 mg total) by mouth daily. 10/31/19   [name], MD  traZODone (DESYREL) 100 MG tablet Take 100 mg by mouth at bedtime.  07/14/12   [provider]    Family History    Family History  Problem Relation Age of Onset  . Colon cancer Father        around age 52  . Diabetes Mother   . Esophageal cancer Neg Hx   . Pancreatic cancer Neg Hx   . Stomach cancer Neg Hx   . Liver disease Neg Hx    She indicated that her mother is deceased. She indicated that her father is deceased. She indicated that her sister is alive. She indicated that her brother is alive. She indicated that the status of her neg hx is unknown.  Social History    Social History   Socioeconomic History  . Marital status: Single    Spouse name: Not  on file  . Number of children: 0  . Years of education: Not on file  . Highest education level: Not on file  Occupational History  . Occupation: Disabled  Tobacco Use  . Smoking status: Never Smoker  . Smokeless tobacco: Never Used  Vaping Use  . Vaping Use: Never used  Substance and Sexual Activity  . Alcohol use: No    Alcohol/week: 0.0 standard drinks  . Drug use: No  . Sexual activity: Never  Other Topics Concern  . Not on file  Social History Narrative   Goes to adult daycare.  Lives with sister.           Social Determinants of Health   Financial Resource Strain: Not on file  Food Insecurity: Not on file  Transportation Needs: Not on file  Physical Activity: Not on file  Stress: Not on file  Social Connections: Not on file  Intimate Partner Violence: Not on file     Review of Systems    General:  No chills, fever, night sweats or weight changes.  Cardiovascular:  No chest pain, dyspnea on exertion, edema, orthopnea, palpitations, paroxysmal nocturnal dyspnea. Dermatological: No rash, lesions/masses Respiratory: No cough, dyspnea Urologic: No hematuria, dysuria Abdominal:   No nausea, vomiting, diarrhea, bright red blood per rectum, melena, or hematemesis Neurologic:  No visual changes, wkns, changes in mental status. All other systems reviewed and are otherwise negative except as noted above.  Physical Exam    VS:  BP 126/82 (BP Location: Left Arm, Patient Position: Sitting, Cuff Size: Large)   Pulse 70   Wt (!) 346 lb 3.2 oz (157 kg)   LMP 05/08/2013   SpO2 98% Comment: 1 liter  BMI 59.43 kg/m  , BMI Body mass index is 59.43 kg/m. GEN: Well nourished, well developed, in no acute distress. HEENT: normal. Neck: Supple, no JVD, carotid bruits, or masses. Cardiac: RRR, no murmurs, rubs, or gallops. No clubbing, cyanosis, edema.  Radials/DP/PT 2+ and equal bilaterally.  Respiratory:  Respirations regular and unlabored, clear to auscultation  bilaterally. GI: Soft, nontender, nondistended, BS + x 4. MS: no deformity or  atrophy. Skin: warm and dry, no rash. Neuro:  Strength and sensation are intact. Psych: Normal affect.  Accessory Clinical Findings    Recent Labs: 02/11/2020: TSH 2.850 03/16/2020: B Natriuretic Peptide 232.4 03/23/2020: Hemoglobin 11.5; Platelets 313 03/24/2020: BUN 31; Creatinine, Ser 1.24; Magnesium 2.3; Potassium 3.9; Sodium 139   Recent Lipid Panel    Component Value Date/Time   CHOL 117 05/11/2018 0310   CHOL 122 07/12/2017 0917   TRIG 37 05/11/2018 0310   HDL 37 (L) 05/11/2018 0310   HDL 44 07/12/2017 0917   CHOLHDL 3.2 05/11/2018 0310   VLDL 7 05/11/2018 0310   LDLCALC 73 05/11/2018 0310   LDLCALC 66 07/12/2017 0917    ECG personally reviewed by me today-none today.  TEE11/5/21:  1. Moderate global reduction in LV systolic function; moderate LAE; mild  prolapse of anterior MV leaflet with mildly restricted posterior leaflet;  severe MR.  2. Left ventricular ejection fraction, by estimation, is 35 to 40%. The  left ventricle has moderately decreased function. The left ventricle  demonstrates global hypokinesis.  3. Right ventricular systolic function is normal. The right ventricular  size is normal.  4. Left atrial size was moderately dilated. No left atrial/left atrial  appendage thrombus was detected.  5. The mitral valve is abnormal. Severe mitral valve regurgitation.  6. The aortic valve is tricuspid. Aortic valve regurgitation is not  visualized. No aortic stenosis is present.  7. There is mild (Grade II) plaque involving the descending aorta.  Assessment & Plan   1.  Acute on chronic systolic and diastolic CHF-no increased DOE or activity tolerance.  Weight today 346.2  Continue furosemide, losartan, metoprolol Heart healthy low-sodium diet-salty 6 given Increase physical activity as tolerated  NSVT-denies recent episodes of increased heart rate or irregular  heartbeats.  Heart rate today 70 bpm Continue metoprolol Heart healthy low-sodium diet-salty 6 given Increase physical activity as tolerated  Essential hypertension-BP today 126/82.  Well-controlled at home. Continue losartan, furosemide, metoprolol Heart healthy low-sodium diet-salty 6 given Increase physical activity as tolerated  Mitral regurgitation-echocardiogram showed severe mitral regurgitation via TEE 03/19/2020.  During November admission she was felt to be a candidate for MitraClip.  You shared decision making to discuss having MitraClip procedure.  Patient is fairly sedentary at home and morbidly obese.  At this time we will not proceed with further evaluation but not eliminate her as candidate for MitraClip.  Discussed with daughter reevaluating patient's health and physical activity in the next 9 to 12 months.  Sister expressed understanding and is agreeable with plan.  Acute kidney injury-creatinine 1.24 at baseline, on 03/24/2020. Follows with PCP  Disposition: Follow-up with Lauren Wood in 3 months.  Jossie Ng. Killian Schwer NP-C    06/25/2020, 9:17 AM [place] [address] Office [phone] Fax [phone]  Notice: This dictation was prepared with Dragon dictation along with smaller phrase technology. Any transcriptional errors that result from this process are unintentional and may not be corrected upon review.  I spent 15 minutes examining this patient, reviewing medications, and using patient centered shared decision making involving her cardiac care.  Prior to her visit I spent greater than 20 minutes reviewing her past medical history,  medications, and prior cardiac tests.

## 2020-06-24 ENCOUNTER — Other Ambulatory Visit: Payer: Self-pay | Admitting: Family Medicine

## 2020-06-24 ENCOUNTER — Other Ambulatory Visit: Payer: Self-pay | Admitting: *Deleted

## 2020-06-24 MED ORDER — ASPIRIN 81 MG PO TBEC
81.0000 mg | DELAYED_RELEASE_TABLET | Freq: Every day | ORAL | 11 refills | Status: DC
Start: 2020-06-24 — End: 2020-07-13

## 2020-06-25 ENCOUNTER — Ambulatory Visit (INDEPENDENT_AMBULATORY_CARE_PROVIDER_SITE_OTHER): Payer: Medicare Other | Admitting: General Practice

## 2020-06-25 ENCOUNTER — Encounter: Payer: Self-pay | Admitting: General Practice

## 2020-06-25 ENCOUNTER — Other Ambulatory Visit: Payer: Self-pay

## 2020-06-25 VITALS — BP 126/82 | HR 70 | Wt 346.2 lb

## 2020-06-25 DIAGNOSIS — I4729 Other ventricular tachycardia: Secondary | ICD-10-CM

## 2020-06-25 DIAGNOSIS — I10 Essential (primary) hypertension: Secondary | ICD-10-CM | POA: Diagnosis not present

## 2020-06-25 DIAGNOSIS — I472 Ventricular tachycardia: Secondary | ICD-10-CM | POA: Diagnosis not present

## 2020-06-25 DIAGNOSIS — N1832 Chronic kidney disease, stage 3b: Secondary | ICD-10-CM

## 2020-06-25 DIAGNOSIS — I5033 Acute on chronic diastolic (congestive) heart failure: Secondary | ICD-10-CM | POA: Diagnosis not present

## 2020-06-25 DIAGNOSIS — I340 Nonrheumatic mitral (valve) insufficiency: Secondary | ICD-10-CM

## 2020-06-25 NOTE — Patient Instructions (Signed)
Medication Instructions:  The current medical regimen is effective;  continue present plan and medications as directed. Please refer to the Current Medication list given to you today.  *If you need a refill on your cardiac medications before your next appointment, please call your pharmacy*  Lab Work:   Testing/Procedures:  NONE    NONE  Special Instructions CONTINUE TO LOOSE WEIGHT  REDUCE OXYGEN TO 1 LITE AT HOME, THEN CONTINUE TO WEAN AS DISCUSSED  PLEASE READ AND FOLLOW SALTY 6-ATTACHED-1,800mg  daily  PLEASE INCREASE PHYSICAL ACTIVITY AS TOLERATED-LIGHT HOUSEWORK, ETC... AS DISCUSSED  Follow-Up: Your next appointment:  3 month(s) In Person with Minus Breeding, MD OR IF UNAVAILABLE [place], FNP-C  At [facility], you and your health needs are our priority.  As part of our continuing mission to provide you with exceptional heart care, we have created designated Provider Care Teams.  These Care Teams include your primary Cardiologist (physician) and Advanced Practice Providers (APPs -  Physician Assistants and Nurse Practitioners) who all work together to provide you with the care you need, when you need it.            6 SALTY THINGS TO AVOID     1,800MG  DAILY

## 2020-07-05 ENCOUNTER — Telehealth: Payer: Self-pay | Admitting: General Practice

## 2020-07-05 ENCOUNTER — Other Ambulatory Visit: Payer: Self-pay | Admitting: *Deleted

## 2020-07-05 NOTE — Telephone Encounter (Signed)
Spoke with patients sister who states that patient was last seen on 2/11. Per patients sister Coletta Memos NP still wanted patient on home O2. Patient sister states that she wanted to ask Denyse Amass if he can write an order for patient to receive oxygen at home. Per patients sister Cardiology is who wanted patient to continue on the Oxygen. Advised patients sister I would forward message to Coletta Memos NP to review and advise. Patients sister verbalized understanding.

## 2020-07-05 NOTE — Telephone Encounter (Signed)
Patients sister made aware of Coletta Memos NP's recommendation and verbalized understanding. Patients sister will continue to monitor patients oxygen level. Advised patients sister to call back with any issues, questions, or concerns. Patients sister verbalized understanding.

## 2020-07-05 NOTE — Telephone Encounter (Signed)
New Message:     Pt's sister called. She wants to know if Denyse Amass will write an order for pt to get oxygen please.

## 2020-07-05 NOTE — Telephone Encounter (Signed)
spoke with pt's sister and informed her of Jesse's notation. Verbalizes understanding. She will CB if anything else is needed

## 2020-07-06 MED ORDER — DOCUSATE SODIUM 100 MG PO CAPS: ORAL_CAPSULE | ORAL | 0 refills | Status: DC

## 2020-07-07 ENCOUNTER — Telehealth: Payer: Self-pay

## 2020-07-07 NOTE — Telephone Encounter (Signed)
Patients sister calls nurse line requesting a new form for personal care services. I have printed out the form and placed in providers box for review.   Attempted to call patients sister back to get more information. However, had to LVM. Will put form in PCP box.

## 2020-07-09 ENCOUNTER — Telehealth: Payer: Self-pay

## 2020-07-09 NOTE — Telephone Encounter (Signed)
[place] OT calls nurse line requesting verbal orders to discharge patient from OT [facility]. Melissa reports the patient has met her goals and is no longer a candidate for OT HH.  Verbal order given per [facility] protocol.

## 2020-07-10 NOTE — Progress Notes (Signed)
     SUBJECTIVE:   CHIEF COMPLAINT / HPI:   Lauren Wood is a 60 y.o. female presents for weight counseling   Sister present at visit  Morbid obesity Weight 346 lb 3.2 oz. Discussed at last visit with sister starting GLP 1 agonist. Her sister signed her up to the gym. Typical diet: breakfast-scrambled egg, sausage and apple sauce/oatmeal, lunch- sandwich/fastfood, dinner-veggies,baked chicken/pork chop. Leads a relatively sedentary lifestyle.   She also has been having a low appetite too.  [place] Office Visit from 06/18/2020 in [place]  PHQ-9 Total Score 5     PERTINENT  PMH / PSH: HTN, CHF, OSA  OBJECTIVE:   BP 125/84   Pulse 74   Wt (!) 346 lb 6.4 oz (157.1 kg)   LMP 05/08/2013   SpO2 95%   BMI 59.46 kg/m    General: Alert, no acute distress Cardio: Well perfused Pulm: Normal work of breathing Neuro: Cranial nerves grossly intact   ASSESSMENT/PLAN:   Morbid obesity ([place]) Started on Ozempic 0.25mg  once weekly. Follow up in 4 weeks where we can increase to 0.5mg  if tolerating well.      [name], MD PGY-2 [place]

## 2020-07-13 ENCOUNTER — Encounter: Payer: Self-pay | Admitting: Family Medicine

## 2020-07-13 ENCOUNTER — Other Ambulatory Visit: Payer: Self-pay

## 2020-07-13 ENCOUNTER — Ambulatory Visit (INDEPENDENT_AMBULATORY_CARE_PROVIDER_SITE_OTHER): Payer: Medicare Other | Admitting: Family Medicine

## 2020-07-13 VITALS — BP 125/84 | HR 74 | Wt 346.4 lb

## 2020-07-13 DIAGNOSIS — N189 Chronic kidney disease, unspecified: Secondary | ICD-10-CM | POA: Diagnosis present

## 2020-07-13 MED ORDER — ALLOPURINOL 100 MG PO TABS: 100.0000 mg | ORAL_TABLET | Freq: Every day | ORAL | 6 refills | Status: DC

## 2020-07-13 MED ORDER — COLCHICINE 0.6 MG PO TABS: ORAL_TABLET | ORAL | 3 refills | Status: DC

## 2020-07-13 MED ORDER — FLUOXETINE HCL 40 MG PO CAPS: 40.0000 mg | ORAL_CAPSULE | Freq: Every morning | ORAL | 3 refills | Status: DC

## 2020-07-13 MED ORDER — TRAZODONE HCL 100 MG PO TABS: 100.0000 mg | ORAL_TABLET | Freq: Every day | ORAL | 3 refills | Status: DC

## 2020-07-13 MED ORDER — LOSARTAN POTASSIUM 25 MG PO TABS: 25.0000 mg | ORAL_TABLET | Freq: Every day | ORAL | 0 refills | Status: DC

## 2020-07-13 MED ORDER — PRAZOSIN HCL 1 MG PO CAPS: 1.0000 mg | ORAL_CAPSULE | Freq: Every day | ORAL | 3 refills | Status: DC

## 2020-07-13 MED ORDER — FUROSEMIDE 80 MG PO TABS: 80.0000 mg | ORAL_TABLET | Freq: Every day | ORAL | 3 refills | Status: DC

## 2020-07-13 MED ORDER — DOCUSATE SODIUM 100 MG PO CAPS: ORAL_CAPSULE | ORAL | 3 refills | Status: DC

## 2020-07-13 MED ORDER — OMEPRAZOLE 20 MG PO CPDR: 20.0000 mg | DELAYED_RELEASE_CAPSULE | Freq: Every day | ORAL | 3 refills | Status: DC

## 2020-07-13 MED ORDER — ASPIRIN 81 MG PO TBEC: 81.0000 mg | DELAYED_RELEASE_TABLET | Freq: Every day | ORAL | 11 refills | Status: DC

## 2020-07-13 MED ORDER — METOPROLOL TARTRATE 100 MG PO TABS: 100.0000 mg | ORAL_TABLET | Freq: Two times a day (BID) | ORAL | 0 refills | Status: DC

## 2020-07-13 MED ORDER — MIRTAZAPINE 30 MG PO TABS: 30.0000 mg | ORAL_TABLET | Freq: Every day | ORAL | 3 refills | Status: DC

## 2020-07-13 MED ORDER — OZEMPIC (0.25 OR 0.5 MG/DOSE) 2 MG/1.5ML ~~LOC~~ SOPN: 0.2500 mg | PEN_INJECTOR | SUBCUTANEOUS | 0 refills | Status: DC

## 2020-07-13 MED ORDER — SPIRONOLACTONE 25 MG PO TABS: 25.0000 mg | ORAL_TABLET | Freq: Every day | ORAL | 3 refills | Status: DC

## 2020-07-13 MED ORDER — CLONAZEPAM 0.5 MG PO TABS: 0.2500 mg | ORAL_TABLET | Freq: Every day | ORAL | 3 refills | Status: DC | PRN

## 2020-07-13 MED ORDER — RISPERIDONE 4 MG PO TABS: 4.0000 mg | ORAL_TABLET | Freq: Two times a day (BID) | ORAL | 0 refills | Status: DC

## 2020-07-13 MED ORDER — PREGABALIN 50 MG PO CAPS: 50.0000 mg | ORAL_CAPSULE | Freq: Every day | ORAL | 3 refills | Status: DC

## 2020-07-13 MED ORDER — BENZTROPINE MESYLATE 0.5 MG PO TABS: 0.2500 mg | ORAL_TABLET | Freq: Two times a day (BID) | ORAL | 3 refills | Status: DC

## 2020-07-13 NOTE — Patient Instructions (Addendum)
Diet Recommendations for Diabetes   Starchy (carb) foods: Bread, rice, pasta, potatoes, corn, cereal, grits, crackers, bagels, muffins, all baked goods.  (Fruits, milk, and yogurt also have carbohydrate, but most of these foods will not spike your blood sugar as most starchy foods will.)  A few fruits do cause high blood sugars; use small portions of bananas (limit to 1/2 at a time), grapes, watermelon, oranges, and most tropical fruits.    Protein foods: Meat, fish, poultry, eggs, dairy foods, and beans such as pinto and kidney beans (beans also provide carbohydrate).   1. Eat at least 3 meals and 1-2 snacks per day. Never go more than 4-5 hours while awake without eating. Eat breakfast within the first hour of getting up.   2. Limit starchy foods to TWO per meal and ONE per snack. ONE portion of a starchy  food is equal to the following:   - ONE slice of bread (or its equivalent, such as half of a hamburger bun).   - 1/2 cup of a "scoopable" starchy food such as potatoes or rice.   - 15 grams of Total Carbohydrate as shown on food label.  3. Include at every meal: a protein food, a carb food, and vegetables and/or fruit.   - Obtain twice the volume of veg's as protein or carbohydrate foods for both lunch and dinner.   - Fresh or frozen veg's are best.   - Keep frozen veg's on hand for a quick vegetable serving.      Thank you for coming to see me today. It was a pleasure. Today we discussed starting ozempic for weight loss. I recommend 0.25mg  once weekly.   Please follow-up with me in 4 weeks   If you have any questions or concerns, please do not hesitate to call the office at [phone].  Best wishes,   Dr Glennon Hamilton injection solution What is this medicine? SEMAGLUTIDE (Sem a GLOO tide) is used to improve blood sugar control in adults with type 2 diabetes. This medicine may be used with other diabetes medicines. This drug may also reduce the risk of heart attack or  stroke if you have type 2 diabetes and risk factors for heart disease. This medicine may be used for other purposes; ask your health care provider or pharmacist if you have questions. COMMON BRAND NAME(S): OZEMPIC What should I tell my health care provider before I take this medicine? They need to know if you have any of these conditions:  endocrine tumors (MEN 2) or if someone in your family had these tumors  eye disease, vision problems  history of pancreatitis  kidney disease  stomach problems  thyroid cancer or if someone in your family had thyroid cancer  an unusual or allergic reaction to semaglutide, other medicines, foods, dyes, or preservatives  pregnant or trying to get pregnant  breast-feeding How should I use this medicine? This medicine is for injection under the skin of your upper leg (thigh), stomach area, or upper arm. It is given once every week (every 7 days). You will be taught how to prepare and give this medicine. Use exactly as directed. Take your medicine at regular intervals. Do not take it more often than directed. If you use this medicine with insulin, you should inject this medicine and the insulin separately. Do not mix them together. Do not give the injections right next to each other. Change (rotate) injection sites with each injection. It is important that you  put your used needles and syringes in a special sharps container. Do not put them in a trash can. If you do not have a sharps container, call your pharmacist or healthcare provider to get one. A special MedGuide will be given to you by the pharmacist with each prescription and refill. Be sure to read this information carefully each time. This drug comes with INSTRUCTIONS FOR USE. Ask your pharmacist for directions on how to use this drug. Read the information carefully. Talk to your pharmacist or health care provider if you have questions. Talk to your pediatrician regarding the use of this medicine  in children. Special care may be needed. Overdosage: If you think you have taken too much of this medicine contact a poison control center or emergency room at once. NOTE: This medicine is only for you. Do not share this medicine with others. What if I miss a dose? If you miss a dose, take it as soon as you can within 5 days after the missed dose. Then take your next dose at your regular weekly time. If it has been longer than 5 days after the missed dose, do not take the missed dose. Take the next dose at your regular time. Do not take double or extra doses. If you have questions about a missed dose, contact your health care provider for advice. What may interact with this medicine?  other medicines for diabetes Many medications may cause changes in blood sugar, these include:  alcohol containing beverages  antiviral medicines for HIV or AIDS  aspirin and aspirin-like drugs  certain medicines for blood pressure, heart disease, irregular heart beat  chromium  diuretics  female hormones, such as estrogens or progestins, birth control pills  fenofibrate  gemfibrozil  isoniazid  lanreotide  female hormones or anabolic steroids  MAOIs like Carbex, Eldepryl, Marplan, Nardil, and Parnate  medicines for weight loss  medicines for allergies, asthma, cold, or cough  medicines for depression, anxiety, or psychotic disturbances  niacin  nicotine  NSAIDs, medicines for pain and inflammation, like ibuprofen or naproxen  octreotide  pasireotide  pentamidine  phenytoin  probenecid  quinolone antibiotics such as ciprofloxacin, levofloxacin, ofloxacin  some herbal dietary supplements  steroid medicines such as prednisone or cortisone  sulfamethoxazole; trimethoprim  thyroid hormones Some medications can hide the warning symptoms of low blood sugar (hypoglycemia). You may need to monitor your blood sugar more closely if you are taking one of these medications. These  include:  beta-blockers, often used for high blood pressure or heart problems (examples include atenolol, metoprolol, propranolol)  clonidine  guanethidine  reserpine This list may not describe all possible interactions. Give your health care provider a list of all the medicines, herbs, non-prescription drugs, or dietary supplements you use. Also tell them if you smoke, drink alcohol, or use illegal drugs. Some items may interact with your medicine. What should I watch for while using this medicine? Visit your doctor or health care professional for regular checks on your progress. Drink plenty of fluids while taking this medicine. Check with your doctor or health care professional if you get an attack of severe diarrhea, nausea, and vomiting. The loss of too much body fluid can make it dangerous for you to take this medicine. A test called the HbA1C (A1C) will be monitored. This is a simple blood test. It measures your blood sugar control over the last 2 to 3 months. You will receive this test every 3 to 6 months. Learn how to check  your blood sugar. Learn the symptoms of low and high blood sugar and how to manage them. Always carry a quick-source of sugar with you in case you have symptoms of low blood sugar. Examples include hard sugar candy or glucose tablets. Make sure others know that you can choke if you eat or drink when you develop serious symptoms of low blood sugar, such as seizures or unconsciousness. They must get medical help at once. Tell your doctor or health care professional if you have high blood sugar. You might need to change the dose of your medicine. If you are sick or exercising more than usual, you might need to change the dose of your medicine. Do not skip meals. Ask your doctor or health care professional if you should avoid alcohol. Many nonprescription cough and cold products contain sugar or alcohol. These can affect blood sugar. Pens should never be shared. Even if the  needle is changed, sharing may result in passing of viruses like hepatitis or HIV. Wear a medical ID bracelet or chain, and carry a card that describes your disease and details of your medicine and dosage times. Do not become pregnant while taking this medicine. Women should inform their doctor if they wish to become pregnant or think they might be pregnant. There is a potential for serious side effects to an unborn child. Talk to your health care professional or pharmacist for more information. What side effects may I notice from receiving this medicine? Side effects that you should report to your doctor or health care professional as soon as possible:  allergic reactions like skin rash, itching or hives, swelling of the face, lips, or tongue  breathing problems  changes in vision  diarrhea that continues or is severe  lump or swelling on the neck  severe nausea  signs and symptoms of infection like fever or chills; cough; sore throat; pain or trouble passing urine  signs and symptoms of low blood sugar such as feeling anxious, confusion, dizziness, increased hunger, unusually weak or tired, sweating, shakiness, cold, irritable, headache, blurred vision, fast heartbeat, loss of consciousness  signs and symptoms of kidney injury like trouble passing urine or change in the amount of urine  trouble swallowing  unusual stomach upset or pain  vomiting Side effects that usually do not require medical attention (report to your doctor or health care professional if they continue or are bothersome):  constipation  diarrhea  nausea  pain, redness, or irritation at site where injected  stomach upset This list may not describe all possible side effects. Call your doctor for medical advice about side effects. You may report side effects to FDA at 1-800-FDA-1088. Where should I keep my medicine? Keep out of the reach of children. Store unopened pens in a refrigerator between 2 and 8  degrees C (36 and 46 degrees F). Do not freeze. Protect from light and heat. After you first use the pen, it can be stored for 56 days at room temperature between 15 and 30 degrees C (59 and 86 degrees F) or in a refrigerator. Throw away your used pen after 56 days or after the expiration date, whichever comes first. Do not store your pen with the needle attached. If the needle is left on, medicine may leak from the pen. NOTE: This sheet is a summary. It may not cover all possible information. If you have questions about this medicine, talk to your doctor, pharmacist, or health care provider.  2021 Elsevier/Gold Standard (2019-01-14 09:41:51)

## 2020-07-14 NOTE — Assessment & Plan Note (Signed)
Started on Ozempic 0.25mg  once weekly. Follow up in 4 weeks where we can increase to 0.5mg  if tolerating well.

## 2020-07-16 ENCOUNTER — Other Ambulatory Visit: Payer: Self-pay

## 2020-07-16 ENCOUNTER — Other Ambulatory Visit: Payer: Medicare Other

## 2020-07-19 NOTE — Telephone Encounter (Signed)
Patient's sister calls nurse line regarding status of [employer] forms. Patient states that she spoke with provider at last visit regarding forms. Agency has not yet received.   Please advise status of forms.   Talbot Grumbling, RN

## 2020-07-20 ENCOUNTER — Other Ambulatory Visit: Payer: Self-pay | Admitting: Family Medicine

## 2020-07-20 ENCOUNTER — Ambulatory Visit: Payer: Medicare Other | Admitting: Podiatry

## 2020-07-20 ENCOUNTER — Telehealth: Payer: Self-pay

## 2020-07-20 NOTE — Telephone Encounter (Signed)
The form will be completed by the end of the day! Please inform Tammie. Thank you

## 2020-07-20 NOTE — Telephone Encounter (Signed)
I have ordered DME bedside commode. Could not find specific order for bariatric shower chair but have ordered stool.  I have also completed her personal care services paperwork and placed it in nursing folder.

## 2020-07-20 NOTE — Telephone Encounter (Signed)
Patient's sister calls nurse line requesting DME bariatric shower chair and bedside commode.   Please route back to "RN Team" once completed for processing.   Talbot Grumbling, RN

## 2020-07-21 NOTE — Telephone Encounter (Signed)
PCS form faxed to [facility] at [phone] and [phone]. Copy made and placed in batch scanning. Called and made sister aware of update.   Talbot Grumbling, RN

## 2020-07-21 NOTE — Telephone Encounter (Signed)
Thank you noted.

## 2020-07-21 NOTE — Telephone Encounter (Signed)
Community message sent to [employer]. Will await response.   Kieren Adkison C Tab Rylee, RN  

## 2020-07-21 NOTE — Telephone Encounter (Signed)
Received the following message from [employer].     received     Talbot Grumbling, RN

## 2020-07-23 ENCOUNTER — Ambulatory Visit: Payer: Medicare Other | Admitting: Podiatry

## 2020-08-04 ENCOUNTER — Telehealth: Payer: Self-pay

## 2020-08-04 NOTE — Telephone Encounter (Signed)
Patient's sister calls nurse line to discuss patient. Patient had an episode on Sunday night where her oxygen levels dropped down to the 80s with shortness of breath. Sister placed patient on 1 L O2 [place]. Oxygen saturations began to improve and patient's SHOB subsided.   Patient is currently asymptomatic with Sp02 in the high 90s. Patient was removed from oxygen once O2 had stabilized and SHOB resolved.   Sister also expresses concerns for possible reaction to Ozempic. Sister reports that patient developed a rash on chest and neck after second dose of Ozempic. She has applied benadryl cream to area, with improvement. Patient's episode with [facility] was prior to Ozempic injection.   Scheduled patient follow up appointment with PCP on 4/7. However, provided strict ED precautions if patient suddenly becomes short of breath or if oxygen saturations drop below 90%.   To PCP  Talbot Grumbling, RN

## 2020-08-05 ENCOUNTER — Other Ambulatory Visit: Payer: Self-pay | Admitting: Family Medicine

## 2020-08-10 NOTE — Telephone Encounter (Addendum)
Received phone call from sister regarding patient. Reports that sister is having increased need for oxygen with activity and is requesting portable oxygen concentrator. States that the lowest Sp02 levels have dropped between 80-86%. Sister reports that after placing her back on 2L of oxygen saturations and work of breathing improves.   Scheduled patient in [facility] on Friday morning at 9:10. Strict ED precautions provided.   Talbot Grumbling, RN

## 2020-08-13 ENCOUNTER — Ambulatory Visit: Payer: Medicare Other

## 2020-08-16 ENCOUNTER — Other Ambulatory Visit: Payer: Self-pay | Admitting: Family Medicine

## 2020-08-19 ENCOUNTER — Encounter: Payer: Self-pay | Admitting: Family Medicine

## 2020-08-19 ENCOUNTER — Other Ambulatory Visit: Payer: Self-pay

## 2020-08-19 ENCOUNTER — Ambulatory Visit (INDEPENDENT_AMBULATORY_CARE_PROVIDER_SITE_OTHER): Payer: Medicare Other | Admitting: Family Medicine

## 2020-08-19 VITALS — BP 118/82 | HR 77 | Ht 64.0 in | Wt 344.2 lb

## 2020-08-19 DIAGNOSIS — I5020 Unspecified systolic (congestive) heart failure: Secondary | ICD-10-CM | POA: Diagnosis present

## 2020-08-19 DIAGNOSIS — J9601 Acute respiratory failure with hypoxia: Secondary | ICD-10-CM

## 2020-08-19 MED ORDER — OZEMPIC (0.25 OR 0.5 MG/DOSE) 2 MG/1.5ML ~~LOC~~ SOPN: 0.5000 mg | PEN_INJECTOR | SUBCUTANEOUS | 0 refills | Status: DC

## 2020-08-19 NOTE — Progress Notes (Signed)
     SUBJECTIVE:   CHIEF COMPLAINT / HPI:   Lauren Wood is a 60 y.o. female presents for weight loss  Accompanied by her sister today.  Weight loss Has lost 2-3 lbs since last visit. Tolerating ozempic well without side effects. Pt is working on portion control and is dedicated to losing weight.   [facility] Office Visit from 08/19/2020 in [place]  PHQ-9 Total Score 5       Desaturations  When pt is walking or exerting herself to 75-80%. She is breathless more on exertion than at rest. Sister puts her on oxygen to 2L, sats come up to 96%. She needs 2L at night. Sometimes she removes it at night. She also uses CPAP at night. Pt denies dyspnea or chest pain today. Sister is trying to get appointment with cardiologist sooner than May.  Health Maintenance Due  Topic  . PAP SMEAR-Modifier       PERTINENT  PMH / PSH: CHF, OSA, GERD  OBJECTIVE:   BP 118/82   Pulse 77   Ht 5\' 4"  (1.626 m)   Wt (!) 344 lb 3.2 oz (156.1 kg)   LMP 05/08/2013   SpO2 93% Comment: with O2  BMI 59.08 kg/m    desat 87% on air sats 93% on 2L oxygen  General: Alert, no acute distress, obese  Cardio: Normal S1 and S2, RRR, no r/m/g Pulm: poor air entry bilaterally due to body habitus Abdomen: Bowel sounds normal. Abdomen soft and non-tender.  Extremities: No peripheral edema.  Neuro: Cranial nerves grossly intact   ASSESSMENT/PLAN:   Acute respiratory failure with hypoxia (HCC) Worsening with increasing oxygen requirements on exertion. Pt now needs oxygen throughout the day which is new to before. Likely multifactorial: worsening CHF, OSA and morbid obesity. Pt is not fluid overloaded on exam today. Pt is already on b blocker, ARB and spironolactone. Could benefit from SGLT2 inhibitor given HFrEF. Will consider adding in the future. Pt is already known to cardiology and seeing them next month. Repeat BMP and BNP today. Prescribed home DME portable oxygen.  Morbid  obesity ([place]) Congratulated pt on her lifestyle modification efforts. Increased ozempic to 0.5mg  weekly. Follow up with me in 1 month.     [name], MD PGY-2 [place]

## 2020-08-19 NOTE — Patient Instructions (Signed)
Thank you for coming to see me today. It was a pleasure. Today we discussed the weight loss. You are doing very well on ozempic. I will increase you to 0.5mg  once weekly. You are requiring more oxygen now. I will place an order for a portable oxygen tank.   Please follow-up with me in 4 weeks   If you have any questions or concerns, please do not hesitate to call the office at [phone].  Best wishes,   Dr Posey Pronto

## 2020-08-20 ENCOUNTER — Telehealth: Payer: Self-pay | Admitting: Cardiology

## 2020-08-20 NOTE — Telephone Encounter (Signed)
Pt c/o Shortness Of Breath: STAT if SOB developed within the last 24 hours or pt is noticeably SOB on the phone  1. Are you currently SOB (can you hear that pt is SOB on the phone)?  Yes, pt is currently at home and sister is at work. Pt's sister is not currently with pt.  2. How long have you been experiencing SOB? 1.5  3. Are you SOB when sitting or when up moving around?  Walking around  4. Are you currently experiencing any other symptoms? No  Pt does have a large oxygen tank at home.  Pt's caretaker states that Dr. Freddi Starr, MD put in a order for portable oxygen yesterday but when caretaker called to check on order at [facility] she was told they have not received an order so caretaker would like to know if Dr. Percival Spanish can place another order.

## 2020-08-20 NOTE — Telephone Encounter (Signed)
Spoke to sister-she states patient's O2 requirements have increased and she now needs O2 when she is up moving.  She states they saw PCP Dr. Posey Pronto yesterday and she ordered a portable oxygen tank and she called [facility] and they do no have the order.  Dr. Ena Dawley office is closed. She is wondering if Dr. Percival Spanish would order this.   Advised I am unsure if this would speed things up.     Called [facility]-spoke to representative states they may have the order but she can only access the "processed" orders.  She states she will send a email to confirm order was received.  She states they will call patient once processed to arrange delivery.     Sister is aware, advised to call back Monday if they do not hear over the weekend to confirm order is processed.

## 2020-08-21 NOTE — Assessment & Plan Note (Signed)
Congratulated pt on her lifestyle modification efforts. Increased ozempic to 0.5mg  weekly. Follow up with me in 1 month.

## 2020-08-21 NOTE — Assessment & Plan Note (Signed)
Worsening with increasing oxygen requirements on exertion. Pt now needs oxygen throughout the day which is new to before. Likely multifactorial: worsening CHF, OSA and morbid obesity. Pt is not fluid overloaded on exam today. Pt is already on b blocker, ARB and spironolactone. Could benefit from SGLT2 inhibitor given HFrEF. Will consider adding in the future. Pt is already known to cardiology and seeing them next month. Repeat BMP and BNP today. Prescribed home DME portable oxygen.

## 2020-08-23 ENCOUNTER — Telehealth: Payer: Self-pay

## 2020-08-23 NOTE — Telephone Encounter (Signed)
Community message sent to [employer] for O2 processing.

## 2020-08-24 NOTE — Telephone Encounter (Signed)
Received message from [employer] confirming they have received request.

## 2020-08-31 ENCOUNTER — Other Ambulatory Visit: Payer: Self-pay | Admitting: Family Medicine

## 2020-08-31 DIAGNOSIS — R0600 Dyspnea, unspecified: Secondary | ICD-10-CM

## 2020-08-31 NOTE — Progress Notes (Signed)
Spoke with patients sister, [name], states that Ms Lauren Wood is doing a little better but still having some SOB on exertion.  Discussed labs and cxr that were unable to be be collected at previous visit.  She will make appointment for lab work tomorrow and take patient for chest xray.   Orders entered for 2 view chest xray, BNP, BMet and CBC.   Will call patient when results available Strict return precautions provided.  [name], MD [facility]

## 2020-09-08 ENCOUNTER — Telehealth: Payer: Self-pay | Admitting: *Deleted

## 2020-09-08 NOTE — Telephone Encounter (Signed)
Received fax requesting 90 day supply of Prazosin, Spironolactone, Allopurinol.  Fax says that her prescription benefit covers 90 day supplies with a copay that is equal or less than three individual 30 day supplies. Skyanne Welle Zimmerman Rumple, CMA

## 2020-09-09 ENCOUNTER — Other Ambulatory Visit: Payer: Self-pay | Admitting: Family Medicine

## 2020-09-09 MED ORDER — ALLOPURINOL 100 MG PO TABS: 100.0000 mg | ORAL_TABLET | Freq: Every day | ORAL | 0 refills | Status: DC

## 2020-09-09 MED ORDER — SPIRONOLACTONE 25 MG PO TABS: 25.0000 mg | ORAL_TABLET | Freq: Every day | ORAL | 0 refills | Status: DC

## 2020-09-09 MED ORDER — PRAZOSIN HCL 1 MG PO CAPS: 1.0000 mg | ORAL_CAPSULE | Freq: Every day | ORAL | 0 refills | Status: DC

## 2020-09-09 NOTE — Telephone Encounter (Signed)
Sent these medications to the pharmacy. Please could you inform the patient? Thank you

## 2020-09-10 ENCOUNTER — Other Ambulatory Visit: Payer: Self-pay | Admitting: Family Medicine

## 2020-09-10 ENCOUNTER — Other Ambulatory Visit: Payer: Self-pay

## 2020-09-10 ENCOUNTER — Other Ambulatory Visit: Payer: Medicare Other

## 2020-09-10 DIAGNOSIS — R0600 Dyspnea, unspecified: Secondary | ICD-10-CM

## 2020-09-11 LAB — CBC
Hematocrit: 38.2 % (ref 34.0–46.6)
Hemoglobin: 12.5 g/dL (ref 11.1–15.9)
MCH: 27.9 pg (ref 26.6–33.0)
MCHC: 32.7 g/dL (ref 31.5–35.7)
MCV: 85 fL (ref 79–97)
Platelets: 281 10*3/uL (ref 150–450)
RBC: 4.48 x10E6/uL (ref 3.77–5.28)
RDW: 12.8 % (ref 11.7–15.4)
WBC: 11.3 10*3/uL — ABNORMAL HIGH (ref 3.4–10.8)

## 2020-09-11 LAB — BASIC METABOLIC PANEL
BUN/Creatinine Ratio: 15 (ref 12–28)
BUN: 21 mg/dL (ref 8–27)
CO2: 26 mmol/L (ref 20–29)
Calcium: 9.7 mg/dL (ref 8.7–10.3)
Chloride: 99 mmol/L (ref 96–106)
Creatinine, Ser: 1.36 mg/dL — ABNORMAL HIGH (ref 0.57–1.00)
Glucose: 60 mg/dL — ABNORMAL LOW (ref 65–99)
Potassium: 3.9 mmol/L (ref 3.5–5.2)
Sodium: 146 mmol/L — ABNORMAL HIGH (ref 134–144)
eGFR: 45 mL/min/{1.73_m2} — ABNORMAL LOW (ref >59)

## 2020-09-11 LAB — BRAIN NATRIURETIC PEPTIDE: BNP: 107.1 pg/mL — ABNORMAL HIGH (ref 0.0–100.0)

## 2020-09-15 NOTE — Progress Notes (Signed)
Cardiology Office Note   Date:  09/17/2020   ID:  Lauren Wood, DOB February 13, 1961, MRN [id number]  PCP:  [name], MD  Cardiologist:   Minus Breeding, MD   No chief complaint on file.     History of Present Illness: Lauren Wood is a 60 y.o. female who presents for follow up of acute on chronic systolic HF.    She is now on home O2.  Since I last saw her she has had no new acute complaints.  She goes to an adult workshop daily.  She gets weighed and they are watching salt.  She is compliant with her medications.  She does drink a fair amount of fluid.  She is not having any new shortness of breath though she does have some dyspnea with exertion treated with the O2.  She wears a CPAP at night.  She sleeps a bit during the day but she is able to be functional going around.  She is not describing any new PND or orthopnea.  She had no new chest discomfort.  She gets around slowly with a walker.  She has been on long-term GLP-1 receptor antagonist and has lost some weight.    Past Medical History:  Diagnosis Date  . ALLERGIC RHINITIS 08/02/2009  . Arthritis   . Boil of vulva 11/07/2012  . Borderline personality disorder ([place]) 05/31/2011  . CKD (chronic kidney disease) stage 3, GFR 30-59 ml/min (HCC)    chronic  . Gout of foot 09/26/2013   L foot 09/2013. In the setting of HCTZ 25 mg and lasix 40 mg daily. Stopped HCTZ after decreasing to 12.5 mg. Treated with colchicine.    Marland Kitchen Heart failure (HCC)    chronic  . Heart murmur   . Hip pain 12/03/2012  . HTN (hypertension)   . 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 Obesity   . 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 PRESSURE ULCER OTHER SITE 10/01/2009   hx of  . Sleep apnea    uses cpap, setting of 2  . Unspecified psychosis 12/20/2009    Past Surgical History:  Procedure  Laterality Date  . COLONOSCOPY N/A 06/24/2013   Procedure: COLONOSCOPY;  Surgeon: Gatha Mayer, MD;  Location: [facility] ENDOSCOPY;  Service: Endoscopy;  Laterality: N/A;  . COLONOSCOPY WITH PROPOFOL N/A 10/05/2015   Procedure: COLONOSCOPY WITH PROPOFOL;  Surgeon: Gatha Mayer, MD;  Location: [facility] ENDOSCOPY;  Service: Endoscopy;  Laterality: N/A;  . cyst removed  2011   forehead  . TEE WITHOUT CARDIOVERSION N/A 03/19/2020   Procedure: TRANSESOPHAGEAL ECHOCARDIOGRAM (TEE);  Surgeon: Lelon Perla, MD;  Location: [facility] ENDOSCOPY;  Service: Cardiovascular;  Laterality: N/A;  . TYMPANOSTOMY TUBE PLACEMENT Bilateral yrs ago     Current Outpatient Medications  Medication Sig Dispense Refill  . acetaminophen (TYLENOL) 500 MG tablet Take 500 mg by mouth every 6 (six) hours as needed for headache.     . allopurinol (ZYLOPRIM) 100 MG tablet Take 1 tablet (100 mg total) by mouth daily. 90 tablet 0  . aspirin (QC LO-DOSE ASPIRIN) 81 MG EC tablet Take 1 tablet (81 mg total) by mouth daily. Swallow whole. 30 tablet 11  . buPROPion (WELLBUTRIN XL) 150 MG 24 hr tablet Take 150 mg by mouth every morning.    Marland Kitchen  clonazePAM (KLONOPIN) 0.5 MG tablet Take 0.5 tablets (0.25 mg total) by mouth daily as needed for anxiety. 30 tablet 3  . colchicine 0.6 MG tablet TAKE ONE-HALF TABLET EVERY OTHER DAY FORGOUT FOR 3 MONTHS 30 tablet 3  . docusate sodium (DOK) 100 MG capsule TAKE ONE CAPSULE TWICE DAILY AS NEEDED FOR MILD OR MODERATE CONSTIPATION 60 capsule 3  . FLUoxetine (PROZAC) 40 MG capsule Take 1 capsule (40 mg total) by mouth every morning. 30 capsule 3  . furosemide (LASIX) 80 MG tablet Take 1 tablet (80 mg total) by mouth daily. 90 tablet 3  . losartan (COZAAR) 25 MG tablet Take 1 tablet (25 mg total) by mouth daily. 30 tablet 0  . metoprolol tartrate (LOPRESSOR) 100 MG tablet TAKE ONE TABLET TWICE DAILY 60 tablet 0  . mirtazapine (REMERON) 30 MG tablet Take 1 tablet (30 mg total) by mouth at bedtime. 30 tablet 3  .  prazosin (MINIPRESS) 1 MG capsule Take 1 capsule (1 mg total) by mouth at bedtime. 90 capsule 0  . pregabalin (LYRICA) 50 MG capsule Take 1 capsule (50 mg total) by mouth daily. 30 capsule 3  . Semaglutide,0.25 or 0.5MG /DOS, (OZEMPIC, 0.25 OR 0.5 MG/DOSE,) 2 MG/1.5ML SOPN Inject 0.5 mg into the skin once a week. 1.5 mL 0  . spironolactone (ALDACTONE) 25 MG tablet Take 1 tablet (25 mg total) by mouth daily. 90 tablet 0  . benztropine (COGENTIN) 0.5 MG tablet Take 0.5 tablets (0.25 mg total) by mouth 2 (two) times daily. 30 tablet 3  . omeprazole (PRILOSEC) 20 MG capsule Take 1 capsule (20 mg total) by mouth daily. 30 capsule 3  . risperidone (RISPERDAL) 4 MG tablet Take 1 tablet (4 mg total) by mouth 2 (two) times daily. 60 tablet 0  . traZODone (DESYREL) 100 MG tablet Take 1 tablet (100 mg total) by mouth at bedtime. 30 tablet 3   No current facility-administered medications for this visit.    Allergies:   Enalapril maleate, Hctz [hydrochlorothiazide], and Lasix [furosemide]    ROS:  Please see the history of present illness.   Otherwise, review of systems are positive for none.   All other systems are reviewed and negative.    PHYSICAL EXAM: VS:  BP 122/66   Pulse (!) 106   Ht 5\' 4"  (1.626 m)   Wt (!) 337 lb 9.6 oz (153.1 kg)   LMP 05/08/2013   SpO2 92%   BMI 57.95 kg/m  , BMI Body mass index is 57.95 kg/m. GEN:  No distress NECK:  No jugular venous distention at 90 degrees, waveform within normal limits, carotid upstroke brisk and symmetric, no bruits, no thyromegaly LYMPHATICS:  No cervical adenopathy LUNGS:  Clear to auscultation bilaterally BACK:  No CVA tenderness CHEST:  Unremarkable HEART:  S1 and S2 within normal limits, no S3, no S4, no clicks, no rubs, no murmurs  (distant heart sounds)  ABD:  Positive bowel sounds normal in frequency in pitch, no bruits, no rebound, no guarding, unable to assess midline mass or bruit with the patient seated. EXT:  2 plus pulses  throughout, mild edema, no cyanosis no clubbing SKIN:  No rashes no nodules NEURO:  Cranial nerves II through XII grossly intact, motor grossly intact throughout PSYCH:  Cognitively intact, oriented to person place and time   EKG:  EKG is not ordered today.    Recent Labs: 02/11/2020: TSH 2.850 03/24/2020: Magnesium 2.3 09/10/2020: BNP 107.1; BUN 21; Creatinine, Ser 1.36; Hemoglobin 12.5; Platelets 281; Potassium  3.9; Sodium 146    Lipid Panel    Component Value Date/Time   CHOL 117 05/11/2018 0310   CHOL 122 07/12/2017 0917   TRIG 37 05/11/2018 0310   HDL 37 (L) 05/11/2018 0310   HDL 44 07/12/2017 0917   CHOLHDL 3.2 05/11/2018 0310   VLDL 7 05/11/2018 0310   LDLCALC 73 05/11/2018 0310   LDLCALC 66 07/12/2017 0917      Wt Readings from Last 3 Encounters:  09/16/20 (!) 337 lb 9.6 oz (153.1 kg)  08/19/20 (!) 344 lb 3.2 oz (156.1 kg)  07/13/20 (!) 346 lb 6.4 oz (157.1 kg)      Other studies Reviewed: Additional studies/ records that were reviewed today include: Labs Review of the above records demonstrates:  Please see elsewhere in the note.     ASSESSMENT AND PLAN:  CHRONIC SYSTOLIC AND  DIASTOLIC HF:       She seems to be euvolemic.  She lives with her sister who by the scale and weighing her routinely.  She is dieting and losing weight.  We talked about PRN dosing of her Lasix.  This has been 80 mg for a while is back up to 40.  She will continue the meds as listed.   CKD III: Her creatinine was most recently 1.36 he has had acute renal insufficiency.  We need to avoid ACE inhibitor's and ARB's.  Might be able to rechallenge her with this in the future.   HTN: The blood pressure is controlled.  Continue current therapy.   MORBID OBESITY:   She has had good weight loss.  In part owing to the semaglutide.  She needs more of the same.  MR:   I reviewed the notes from her Nov hospitalization.  She is not a surgical candidate but she might be considered a MitraClip  candidate.  I have discussed this with Dr. Burt Knack at that time.  I am going to continue goal-directed medical therapy and then consider reimaging.  I think she would be high risk for any invasive procedure.   Current medicines are reviewed at length with the patient today.  The patient does not have concerns regarding medicines.  The following changes have been made: None  Labs/ tests ordered today include: None  No orders of the defined types were placed in this encounter.    Disposition:   FU with Jory Sims DNP in about 3 months.    Signed, Minus Breeding, MD  09/17/2020 7:47 AM    [facility]

## 2020-09-16 ENCOUNTER — Ambulatory Visit (INDEPENDENT_AMBULATORY_CARE_PROVIDER_SITE_OTHER): Payer: Medicare Other | Admitting: Cardiology

## 2020-09-16 ENCOUNTER — Other Ambulatory Visit: Payer: Self-pay

## 2020-09-16 ENCOUNTER — Encounter: Payer: Self-pay | Admitting: Cardiology

## 2020-09-16 VITALS — BP 122/66 | HR 106 | Ht 64.0 in | Wt 337.6 lb

## 2020-09-16 DIAGNOSIS — N1832 Chronic kidney disease, stage 3b: Secondary | ICD-10-CM | POA: Diagnosis not present

## 2020-09-16 DIAGNOSIS — I5033 Acute on chronic diastolic (congestive) heart failure: Secondary | ICD-10-CM

## 2020-09-16 DIAGNOSIS — I10 Essential (primary) hypertension: Secondary | ICD-10-CM | POA: Diagnosis not present

## 2020-09-16 DIAGNOSIS — I5042 Chronic combined systolic (congestive) and diastolic (congestive) heart failure: Secondary | ICD-10-CM

## 2020-09-16 DIAGNOSIS — I340 Nonrheumatic mitral (valve) insufficiency: Secondary | ICD-10-CM

## 2020-09-16 NOTE — Patient Instructions (Signed)
Medication Instructions:  Continue current medications  *If you need a refill on your cardiac medications before your next appointment, please call your pharmacy*   Lab Work: None Ordered   Testing/Procedures: None Ordered   Follow-Up: At [facility], you and your health needs are our priority.  As part of our continuing mission to provide you with exceptional heart care, we have created designated Provider Care Teams.  These Care Teams include your primary Cardiologist (physician) and Advanced Practice Providers (APPs -  Physician Assistants and Nurse Practitioners) who all work together to provide you with the care you need, when you need it.  We recommend signing up for the patient portal called "MyChart".  Sign up information is provided on this After Visit Summary.  MyChart is used to connect with patients for Virtual Visits (Telemedicine).  Patients are able to view lab/test results, encounter notes, upcoming appointments, etc.  Non-urgent messages can be sent to your provider as well.   To learn more about what you can do with MyChart, go to [email or web address].    Your next appointment:   6 month(s)  The format for your next appointment:   In Person  Provider:   Jory Sims, DNP

## 2020-10-19 ENCOUNTER — Ambulatory Visit: Payer: Medicare Other | Admitting: Family Medicine

## 2020-10-22 ENCOUNTER — Other Ambulatory Visit: Payer: Self-pay | Admitting: Family Medicine

## 2020-10-26 ENCOUNTER — Encounter: Payer: Self-pay | Admitting: Family Medicine

## 2020-10-26 ENCOUNTER — Ambulatory Visit (INDEPENDENT_AMBULATORY_CARE_PROVIDER_SITE_OTHER): Payer: Medicare Other | Admitting: Family Medicine

## 2020-10-26 ENCOUNTER — Other Ambulatory Visit: Payer: Self-pay

## 2020-10-26 VITALS — BP 110/47 | HR 76 | Ht 64.0 in | Wt 319.6 lb

## 2020-10-26 DIAGNOSIS — R197 Diarrhea, unspecified: Secondary | ICD-10-CM | POA: Diagnosis present

## 2020-10-26 DIAGNOSIS — R630 Anorexia: Secondary | ICD-10-CM | POA: Insufficient documentation

## 2020-10-26 NOTE — Patient Instructions (Signed)
It was nice to see you today,  We discussed the following things today: - I would like you to stop your Ozempic until someone tells you otherwise. - I would like you to stop the colchicine.  If you develop of gout flare you can restart it but please contact us first. - I would like you to follow back up in 2 weeks.  If your diarrhea is not improved in 2 weeks we will need to look for alternative causes.  I had some blood work done today.  I will talk to you about the results at your next visit.  Have a great day,  Clemetine Marker, MD

## 2020-10-26 NOTE — Assessment & Plan Note (Addendum)
No nausea vomiting or abdominal pain.  Nontender abdominal exam.  According to patient and sister she goes to the bathroom for a bowel movement "every 5 minutes".  Also with nighttime awakenings for bowel movement.  If symptoms not improved with cessation of colchicine and Ozempic would consider further work-up of diarrhea including C. difficile.  Also could consider adding Imodium temporarily.  Follow-up in 2 weeks

## 2020-10-26 NOTE — Progress Notes (Signed)
    SUBJECTIVE:   CHIEF COMPLAINT / HPI:   Decreased appetite: ongoing for 3.5 weeks.  Not eating anything, just drinking water and coffee.  Patient states she is just 'not hungry'.  Denies nausea, vomiting, abd pain, dysphagia.  Patient states she is having diarrhea.  Patient and her sister states she goes to the bathroom for a bowel movement every 5 minutes.  Sister states it is foul smelling.  No recent abx use.  Started taking ozempic about 4 weeks ago.  Also takes colchicine every other day.  Appetite has decreased since returning since snf at the end of January but worseend greatly about 4 weeks ago. Patient drinks 8 oz of water 8 times a day as per her cardiologist recommendation. Diarrhea also occurs during the night and wakes the patient up.     PERTINENT  PMH / PSH: intellectual disability  OBJECTIVE:   BP (!) 110/47   Pulse 76   Ht 5\' 4"  (1.626 m)   Wt (!) 319 lb 9.6 oz (145 kg)   LMP 05/08/2013   SpO2 93%   BMI 54.86 kg/m   General: Alert.  Using walker.  Accompanied by sister/caretaker HEENT: Exotropia CV: Regular rate and rhythm, no murmurs. Pulmonary: Lungs clear to auscultation bilaterally, no crackles. GI: Soft, nontender.  Normal bowel sounds. Extremities: No pitting edema.  ASSESSMENT/PLAN:   Decreased appetite 3.5 weeks of very little food intake.  Still drinking water and coffee.  Drinks 8 x 8 ounce bottles of water daily per recommendation from cardiologist.  Currently taking colchicine every other day and Ozempic once weekly.  Worsening of appetite and diarrhea associated with onset of starting Ozempic.  Will stop both Ozempic and colchicine today.  Advised patient's caretaker/sister that if gout flare occurs she can notify us and restart the colchicine.  We will follow-up in 2 weeks with PCP.  If symptoms not improved at that time we will need to consider further work-up.  Getting lab work today.  Diarrhea No nausea vomiting or abdominal pain.  Nontender  abdominal exam.  According to patient and sister she goes to the bathroom for a bowel movement "every 5 minutes".  Also with nighttime awakenings for bowel movement.  If symptoms not improved with cessation of colchicine and Ozempic would consider further work-up of diarrhea including C. difficile.  Also could consider adding Imodium temporarily.  Follow-up in 2 weeks     Benay Pike, MD [place]

## 2020-10-26 NOTE — Assessment & Plan Note (Signed)
3.5 weeks of very little food intake.  Still drinking water and coffee.  Drinks 8 x 8 ounce bottles of water daily per recommendation from cardiologist.  Currently taking colchicine every other day and Ozempic once weekly.  Worsening of appetite and diarrhea associated with onset of starting Ozempic.  Will stop both Ozempic and colchicine today.  Advised patient's caretaker/sister that if gout flare occurs she can notify us and restart the colchicine.  We will follow-up in 2 weeks with PCP.  If symptoms not improved at that time we will need to consider further work-up.  Getting lab work today.

## 2020-11-01 ENCOUNTER — Other Ambulatory Visit: Payer: Self-pay

## 2020-11-01 ENCOUNTER — Other Ambulatory Visit: Payer: Medicare Other

## 2020-11-02 ENCOUNTER — Ambulatory Visit: Payer: Medicare Other | Admitting: Family Medicine

## 2020-11-02 LAB — BASIC METABOLIC PANEL
BUN/Creatinine Ratio: 8 — ABNORMAL LOW (ref 12–28)
BUN: 12 mg/dL (ref 8–27)
CO2: 26 mmol/L (ref 20–29)
Calcium: 9.4 mg/dL (ref 8.7–10.3)
Chloride: 84 mmol/L — ABNORMAL LOW (ref 96–106)
Creatinine, Ser: 1.45 mg/dL — ABNORMAL HIGH (ref 0.57–1.00)
Glucose: 105 mg/dL — ABNORMAL HIGH (ref 65–99)
Potassium: 3.3 mmol/L — ABNORMAL LOW (ref 3.5–5.2)
Sodium: 128 mmol/L — ABNORMAL LOW (ref 134–144)
eGFR: 41 mL/min/{1.73_m2} — ABNORMAL LOW (ref >59)

## 2020-11-02 LAB — HEPATIC FUNCTION PANEL
ALT: 5 IU/L (ref 0–32)
AST: 15 IU/L (ref 0–40)
Albumin: 4.1 g/dL (ref 3.8–4.9)
Alkaline Phosphatase: 141 IU/L — ABNORMAL HIGH (ref 44–121)
Bilirubin Total: 0.3 mg/dL (ref 0.0–1.2)
Bilirubin, Direct: 0.11 mg/dL (ref 0.00–0.40)
Total Protein: 7.2 g/dL (ref 6.0–8.5)

## 2020-11-02 LAB — CBC
Hematocrit: 34.6 % (ref 34.0–46.6)
Hemoglobin: 11.9 g/dL (ref 11.1–15.9)
MCH: 27.4 pg (ref 26.6–33.0)
MCHC: 34.4 g/dL (ref 31.5–35.7)
MCV: 80 fL (ref 79–97)
Platelets: 274 10*3/uL (ref 150–450)
RBC: 4.35 x10E6/uL (ref 3.77–5.28)
RDW: 13 % (ref 11.7–15.4)
WBC: 8.2 10*3/uL (ref 3.4–10.8)

## 2020-11-09 ENCOUNTER — Ambulatory Visit: Payer: Medicare Other | Admitting: Family Medicine

## 2020-11-11 ENCOUNTER — Other Ambulatory Visit: Payer: Self-pay | Admitting: Family Medicine

## 2020-11-11 DIAGNOSIS — E876 Hypokalemia: Secondary | ICD-10-CM

## 2020-11-13 ENCOUNTER — Other Ambulatory Visit: Payer: Self-pay | Admitting: Family Medicine

## 2020-11-19 ENCOUNTER — Other Ambulatory Visit: Payer: Self-pay

## 2020-11-19 ENCOUNTER — Encounter: Payer: Self-pay | Admitting: Family Medicine

## 2020-11-19 ENCOUNTER — Ambulatory Visit (INDEPENDENT_AMBULATORY_CARE_PROVIDER_SITE_OTHER): Payer: Medicare Other | Admitting: Family Medicine

## 2020-11-19 VITALS — BP 118/76 | HR 65 | Ht 64.0 in | Wt 326.0 lb

## 2020-11-19 DIAGNOSIS — N189 Chronic kidney disease, unspecified: Secondary | ICD-10-CM | POA: Diagnosis present

## 2020-11-19 MED ORDER — SEMAGLUTIDE(0.25 OR 0.5MG/DOS) 2 MG/1.5ML ~~LOC~~ SOPN: 0.2500 mg | PEN_INJECTOR | SUBCUTANEOUS | 0 refills | Status: DC

## 2020-11-19 NOTE — Patient Instructions (Signed)
Thank you for coming to see me today. It was a pleasure. Today we discussed your poor appetite. We can start the ozempic back at 0.25mg  weekly. I have sent this to the pharmacy.  Please contact your GI doctor for a colonoscopy.  Follow up as and when you need to.  If you have any questions or concerns, please do not hesitate to call the office at [phone].  Best wishes,   Dr Posey Pronto

## 2020-11-19 NOTE — Progress Notes (Signed)
     SUBJECTIVE:   CHIEF COMPLAINT / HPI:   Lauren Wood is a 60 y.o. female presents for decreased appetite   Decreased appetite  Pt was seen in June for decreased appetite. At this time she was only drinking water and coffee. This was at a similar time to when she started ozempic for weigh tloss. Dr Irish Elders recommended stopping ozempic and colcichine to see if this helps her symptoms. Pt's sister reports she has been eating more since d/c Ozempic and Colchicine. She overall feels better and is eating 2 meals a day. Denies nausea, vomiting, belly pains, diarrhea, constipation, hematochezia or melena. Both sister and pt would like her to start back on the Ozempic at a low dose.    PERTINENT  PMH / PSH: CHF, GERD  OBJECTIVE:   BP 118/76   Pulse 65   Ht 5\' 4"  (1.626 m)   Wt (!) 326 lb (147.9 kg)   LMP 05/08/2013   SpO2 99%   BMI 55.96 kg/m    General: Alert, no acute distress, obese female  Cardio: well perfused Pulm: normal work of breathing Neuro: Cranial nerves grossly intact   ASSESSMENT/PLAN:   Morbid obesity ([place]) Ozempic stopped at previous visit due to poor appetite. Pt has regained 7lb since this time. Pt is now eating 2 regular meals a day and her appetite is back. Both pt and sister would like to restart Ozempic. I agreed for a trial of this at a low dose of 0.25mg . Recommended stopping Ozempic if she has a poor appetite again. Follow up as needed.      [name], MD PGY-3 [place]

## 2020-11-20 LAB — BASIC METABOLIC PANEL
BUN/Creatinine Ratio: 11 — ABNORMAL LOW (ref 12–28)
BUN: 13 mg/dL (ref 8–27)
CO2: 27 mmol/L (ref 20–29)
Calcium: 8.7 mg/dL (ref 8.7–10.3)
Chloride: 99 mmol/L (ref 96–106)
Creatinine, Ser: 1.19 mg/dL — ABNORMAL HIGH (ref 0.57–1.00)
Glucose: 60 mg/dL — ABNORMAL LOW (ref 65–99)
Potassium: 3.9 mmol/L (ref 3.5–5.2)
Sodium: 143 mmol/L (ref 134–144)
eGFR: 52 mL/min/{1.73_m2} — ABNORMAL LOW (ref >59)

## 2020-11-22 ENCOUNTER — Encounter: Payer: Self-pay | Admitting: Family Medicine

## 2020-11-23 NOTE — Assessment & Plan Note (Signed)
Ozempic stopped at previous visit due to poor appetite. Pt has regained 7lb since this time. Pt is now eating 2 regular meals a day and her appetite is back. Both pt and sister would like to restart Ozempic. I agreed for a trial of this at a low dose of 0.25mg . Recommended stopping Ozempic if she has a poor appetite again. Follow up as needed.

## 2020-12-15 ENCOUNTER — Other Ambulatory Visit: Payer: Self-pay

## 2020-12-15 ENCOUNTER — Ambulatory Visit (INDEPENDENT_AMBULATORY_CARE_PROVIDER_SITE_OTHER): Payer: Medicare Other | Admitting: Podiatry

## 2020-12-15 ENCOUNTER — Encounter: Payer: Self-pay | Admitting: Podiatry

## 2020-12-15 DIAGNOSIS — B351 Tinea unguium: Secondary | ICD-10-CM

## 2020-12-15 DIAGNOSIS — M79675 Pain in left toe(s): Secondary | ICD-10-CM | POA: Diagnosis not present

## 2020-12-15 DIAGNOSIS — M79674 Pain in right toe(s): Secondary | ICD-10-CM | POA: Diagnosis not present

## 2020-12-15 NOTE — Progress Notes (Signed)
  Subjective:  Patient ID: Lauren Wood, female    DOB: Sep 20, 1960,  MRN: [id number]  [name] presents to clinic today for at risk foot care. Patient has h/o CKD stage 3 and painful thick toenails that are difficult to trim. Pain interferes with ambulation. Aggravating factors include wearing enclosed shoe gear. Pain is relieved with periodic professional debridement.  PCP is [name], MD, and last visit was 10/26/2020.  Allergies  Allergen Reactions   Enalapril Maleate Other (See Comments)    Unknown reaction   Hctz [Hydrochlorothiazide] Other (See Comments)    Hyperuricemia with Lasix    Lasix [Furosemide] Other (See Comments)    Hyperuricemia with HCTZ      Review of Systems: Negative except as noted in the HPI. Objective:   Constitutional [name] is a pleasant 60 y.o. African American female, morbidly obese in NAD. AAO x 3.   Vascular Capillary refill time to digits immediate b/l. Palpable DP pulse(s) b/l lower extremities Palpable PT pulse(s) b/l lower extremities Pedal hair sparse. Lower extremity skin temperature gradient within normal limits. No pain with calf compression b/l. Nonpitting edema noted b/l lower extremities. No cyanosis or clubbing noted.  Neurologic Normal speech. Oriented to person, place, and time. Patient unable to follow commands of LE neurological examination due to cognitive deficits. Patient does respond to external noxious stimuli. Clonus negative b/l.  Dermatologic Pedal skin with normal turgor, texture and tone b/l lower extremities. No open wounds b/l lower extremities. No interdigital macerations b/l lower extremities. Toenails 1-5 b/l elongated, discolored, dystrophic, thickened, crumbly with subungual debris and tenderness to dorsal palpation.  Orthopedic: Normal muscle strength 5/5 to all lower extremity muscle groups bilaterally. No gross bony deformities b/l lower extremities.   Radiographs: None Assessment:   1. Pain due to  onychomycosis of toenails of both feet    Plan:  -Examined patient. -No new findings. No new orders. -Patient to continue soft, supportive shoe gear daily. -Toenails 1-5 b/l were debrided in length and girth with sterile nail nippers and dremel without iatrogenic bleeding.  -Patient to report any pedal injuries to medical professional immediately. -Patient/POA to call should there be question/concern in the interim.  Return in about 3 months (around 03/17/2021).  Marzetta Board, DPM

## 2020-12-22 ENCOUNTER — Telehealth: Payer: Self-pay

## 2020-12-22 NOTE — Telephone Encounter (Signed)
Will be happy to complete these forms when they get faxed to [place].

## 2020-12-22 NOTE — Telephone Encounter (Signed)
Patient's sister calls nurse line regarding request for incontinence supplies. Called [place] [facility]. They had hold on order due to patient being in rehab, however, patient is now at home. Shipment will be sent out later this week.   Updated paperwork will be being faxed to our office for annual completion.   FYI to PCP.   Talbot Grumbling, RN

## 2020-12-28 ENCOUNTER — Other Ambulatory Visit: Payer: Self-pay | Admitting: Family Medicine

## 2021-01-05 ENCOUNTER — Other Ambulatory Visit: Payer: Self-pay | Admitting: Family Medicine

## 2021-01-19 ENCOUNTER — Encounter: Payer: Self-pay | Admitting: Internal Medicine

## 2021-01-25 ENCOUNTER — Ambulatory Visit (INDEPENDENT_AMBULATORY_CARE_PROVIDER_SITE_OTHER): Payer: Medicare Other | Admitting: *Deleted

## 2021-01-25 ENCOUNTER — Other Ambulatory Visit: Payer: Self-pay

## 2021-01-25 ENCOUNTER — Other Ambulatory Visit: Payer: Self-pay | Admitting: Family Medicine

## 2021-01-25 DIAGNOSIS — G609 Hereditary and idiopathic neuropathy, unspecified: Secondary | ICD-10-CM

## 2021-01-25 NOTE — Progress Notes (Addendum)
Patient presents to the office today for extra depth shoes and insole measuring.  Patient was measured with brannock device to determine size and width for 1 pair of extra depth shoes and foam casted for 1 pair of insoles.   Patient is not diabetic and will be self pay.  Shoes and insoles will be ordered at that time and patient will be notified for an appointment for fitting when they arrive.   Shoe size (per patient): Men's 11   Brannock measurement: RIGHT - 10 D, LEFT - 9.5 E  Patient shoe selection-   1st choice:   Orthofeet 887  2nd choice:  Apex V950 (Men's shoe)  Shoe size ordered: Women's 10.5 X-Wide

## 2021-01-28 ENCOUNTER — Other Ambulatory Visit: Payer: Self-pay

## 2021-02-03 ENCOUNTER — Telehealth: Payer: Self-pay | Admitting: Podiatry

## 2021-02-03 NOTE — Telephone Encounter (Signed)
I was ordering Diabetic shoes and inserts for pt and needed dx and upon looking pt is not diabetic. I called number and pts sister answered the phone and said pt is not diabetic but they were going to pay for the shoes as pt needs them and it was discussed at the appt and she is aware of the cost.

## 2021-02-11 ENCOUNTER — Other Ambulatory Visit: Payer: Self-pay | Admitting: Family Medicine

## 2021-02-24 IMAGING — CT CT ANGIO CHEST
2 of 6 series · 18 of 36 positions shown · IV contrast (omnipaque)
Comparison: No priors.

CLINICAL DATA: 57-year-old female with clinical suspicion of
pulmonary embolism. Positive D-dimer.

EXAM:
CT ANGIOGRAPHY CHEST WITH CONTRAST
TECHNIQUE: Multidetector CT imaging of the chest was performed using the
standard protocol during bolus administration of intravenous
contrast. Multiplanar CT image reconstructions and MIPs were
obtained to evaluate the vascular anatomy.
CONTRAST:  100mL OMNIPAQUE IOHEXOL 350 MG/ML SOLN

[Series 13: pe thins · axial · 0.78mm/px · z∈[+1333,+1589]mm · 17 of 404 slices shown]
[im 19/404  lung]
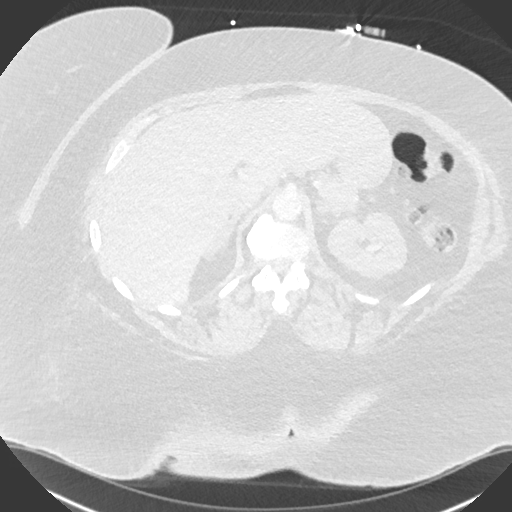
[im 37/404  mediastinal]
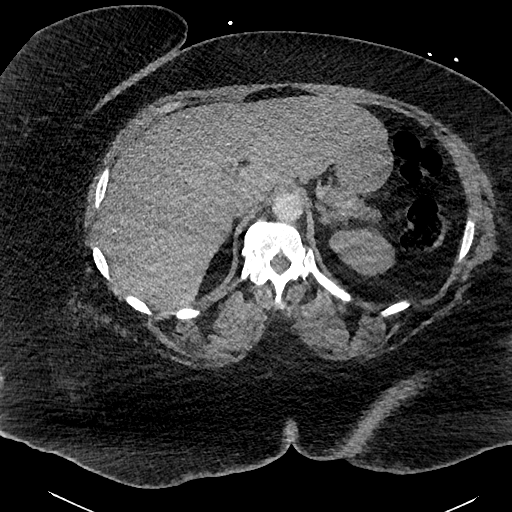
[im 74/404  lung]
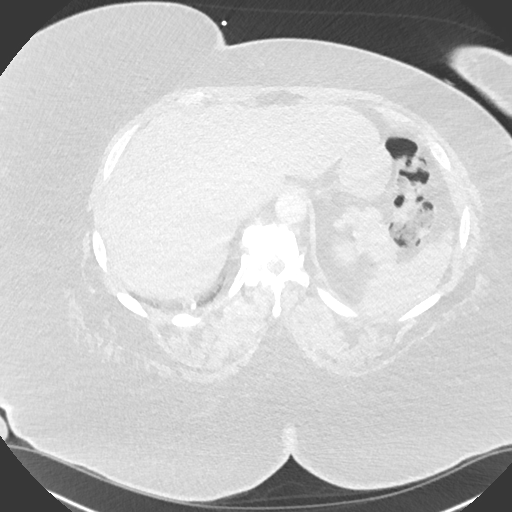
[im 92/404  mediastinal]
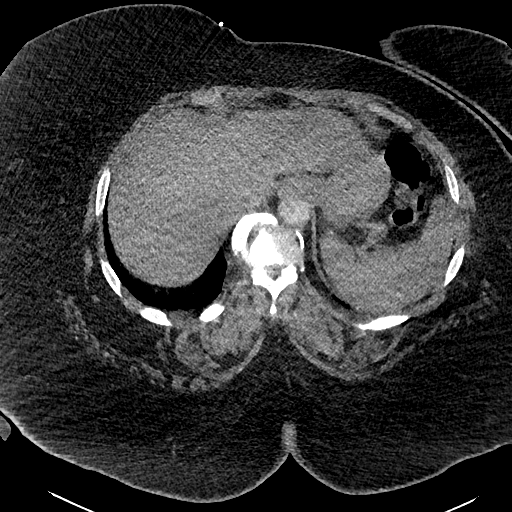
[im 110/404  lung]
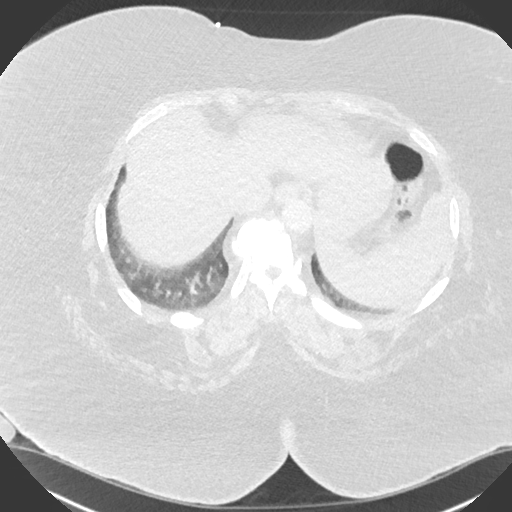
[im 129/404  mediastinal]
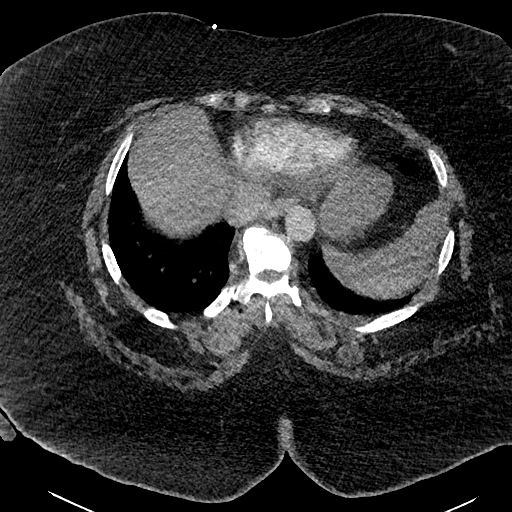
[im 165/404  lung]
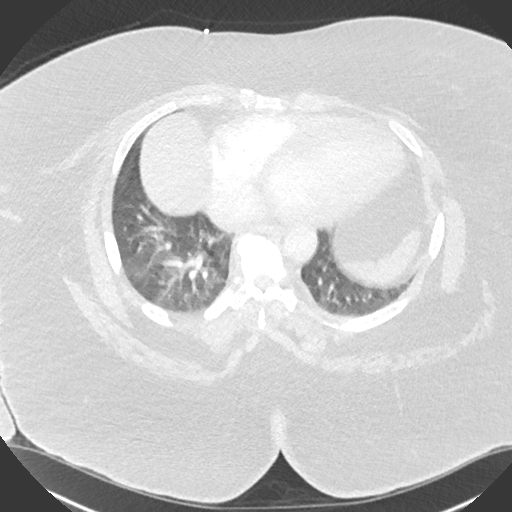
[im 184/404  mediastinal]
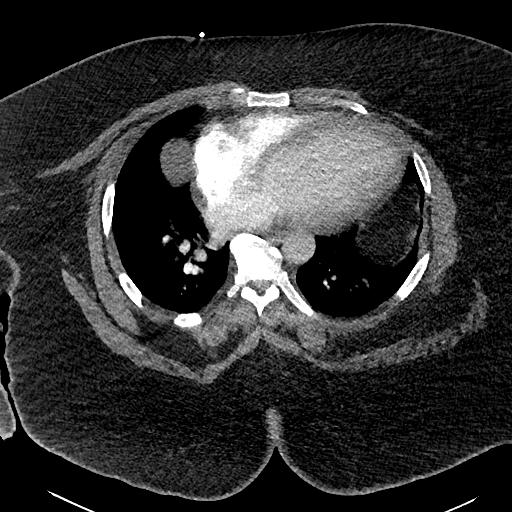
[im 202/404  lung]
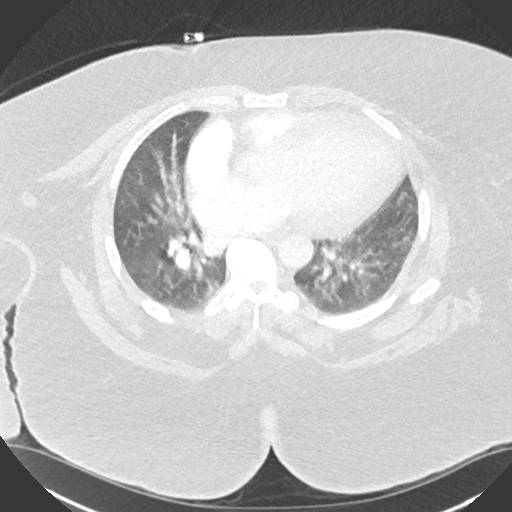
[im 220/404  mediastinal]
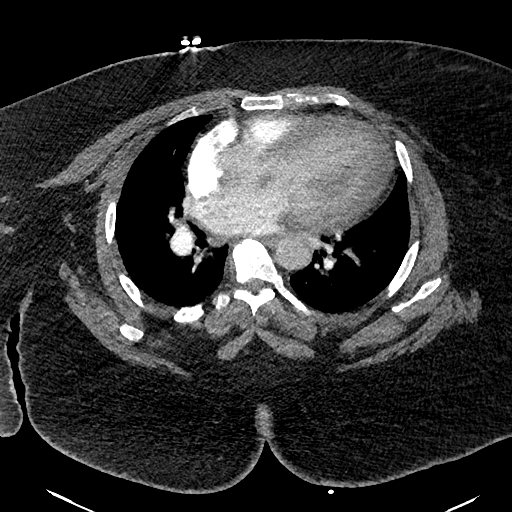
[im 239/404  lung]
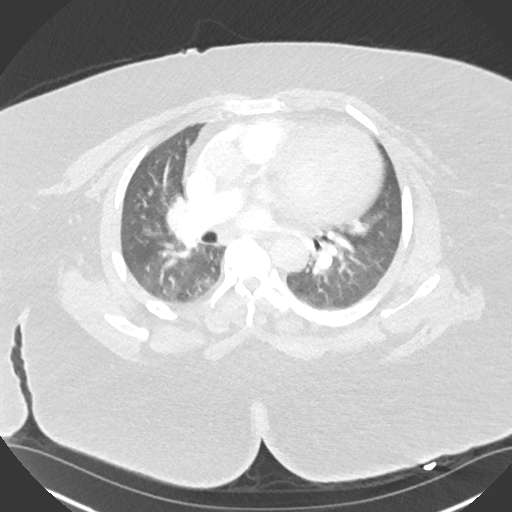
[im 275/404  mediastinal]
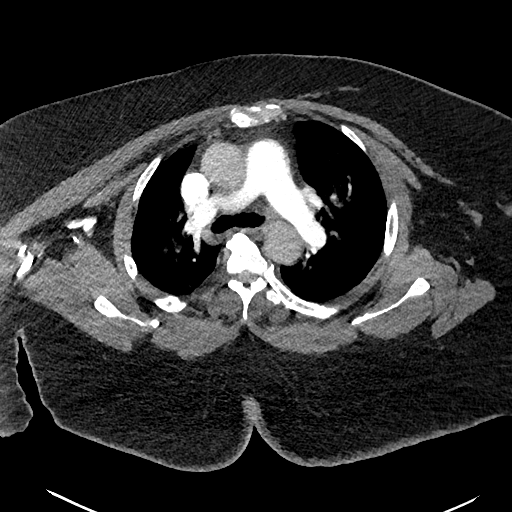
[im 294/404  lung]
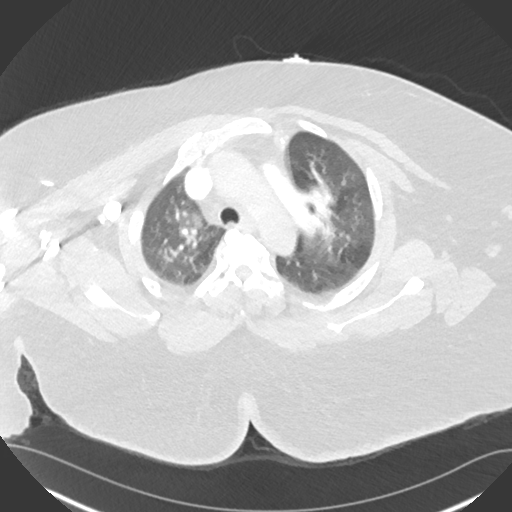
[im 312/404  mediastinal]
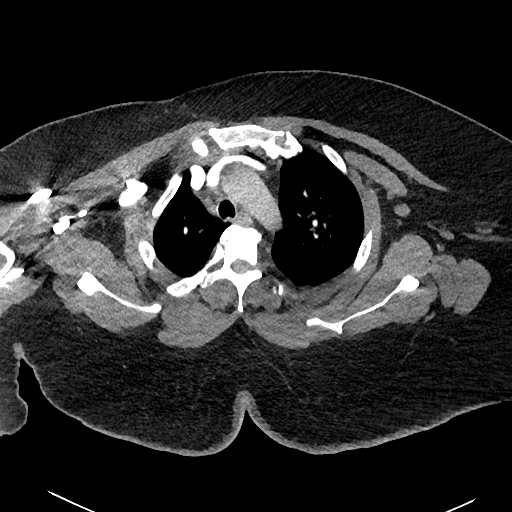
[im 330/404  lung]
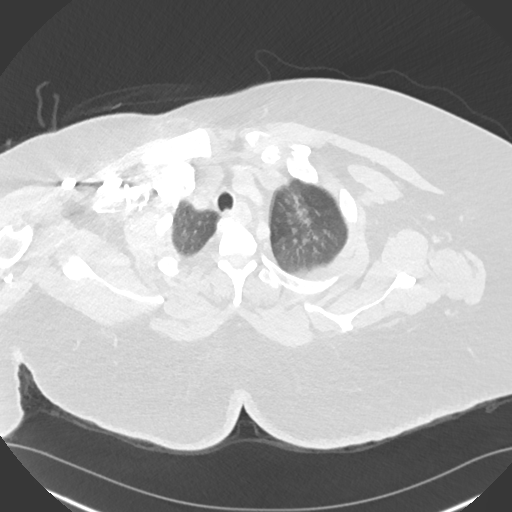
[im 367/404  mediastinal]
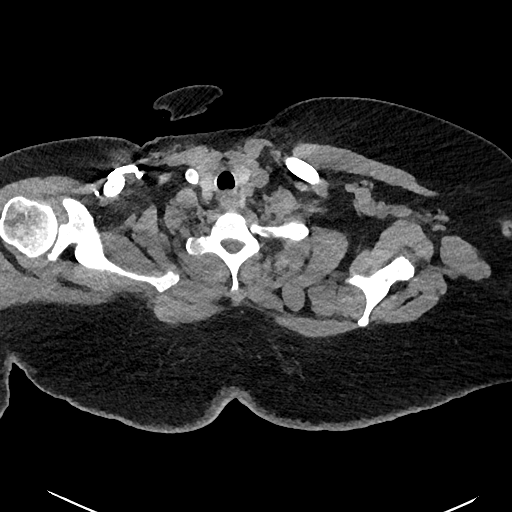
[im 385/404  lung]
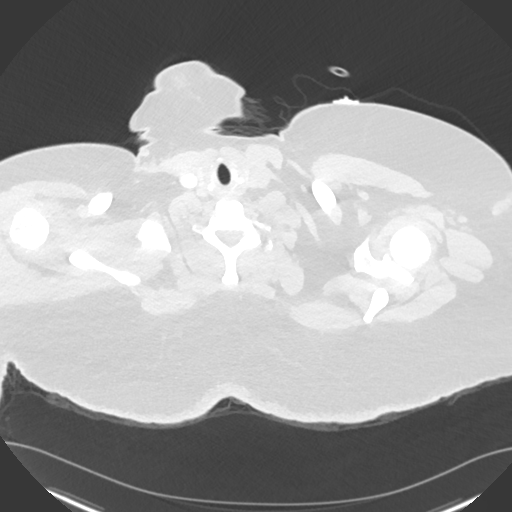

[Series 14: pe 2mm cor · coronal · 0.59mm/px · 1 of 101 slices shown]
[im 51/101  mediastinal]
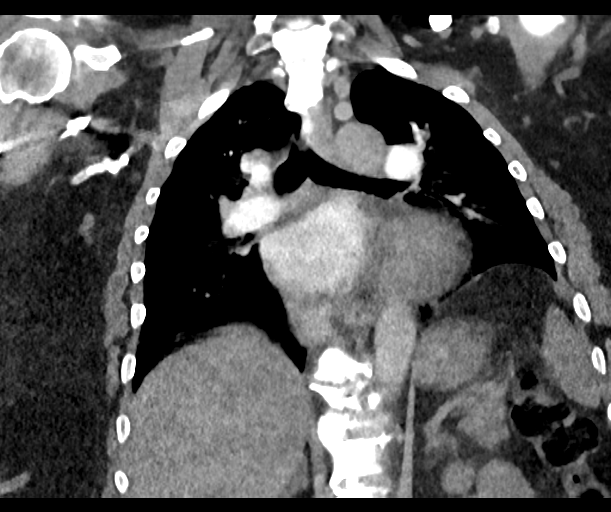

[18 of 36 positions shown; findings below may reference images not displayed]

FINDINGS: Cardiovascular: No filling defect within the pulmonary arterial tree
to suggest underlying pulmonary embolism. Heart size is enlarged
with left ventricular dilatation. There is no significant
pericardial fluid, thickening or pericardial calcification. Aortic
atherosclerosis. No definite coronary artery calcifications.

Mediastinum/Nodes: No pathologically enlarged mediastinal or hilar
lymph nodes. Esophagus is unremarkable in appearance. No axillary
lymphadenopathy.

Lungs/Pleura: Mild ground-glass attenuation and interlobular septal
thickening noted in the lungs bilaterally, suggesting a background
of mild interstitial pulmonary edema. No confluent consolidative
airspace disease. No pleural effusions. Small 4 mm right lower lobe
nodule (axial image 64 of series 12).

Upper Abdomen: Unremarkable.

Musculoskeletal: There are no aggressive appearing lytic or blastic
lesions noted in the visualized portions of the skeleton.

Review of the MIP images confirms the above findings.
IMPRESSION: 1. No evidence of pulmonary embolism.
2. Cardiomegaly with left ventricular dilatation and evidence of
mild interstitial pulmonary edema; imaging findings suggestive of
mild congestive heart failure.
3. 4 mm right lower lobe pulmonary nodule (axial image 64 of series
12). This is nonspecific, but statistically likely benign. No
follow-up needed if patient is low-risk. Non-contrast chest CT can
be considered in 12 months if patient is high-risk. This
recommendation follows the consensus statement: Guidelines for
Management of Incidental Pulmonary Nodules Detected on CT Images:
4. Aortic atherosclerosis.

Aortic Atherosclerosis (H6W0N-C29.9).

## 2021-02-28 ENCOUNTER — Telehealth: Payer: Self-pay | Admitting: Podiatry

## 2021-02-28 NOTE — Telephone Encounter (Signed)
Shoes and inserts in.. pt is self pay and I have left a message on sister's([name]) voicemail to call to get scheduled for an appt to pick up the shoes and inserts.

## 2021-03-07 ENCOUNTER — Other Ambulatory Visit: Payer: Self-pay | Admitting: Family Medicine

## 2021-03-14 ENCOUNTER — Other Ambulatory Visit: Payer: Self-pay | Admitting: Family Medicine

## 2021-03-18 ENCOUNTER — Other Ambulatory Visit: Payer: Self-pay

## 2021-03-18 ENCOUNTER — Encounter: Payer: Self-pay | Admitting: Podiatry

## 2021-03-18 ENCOUNTER — Ambulatory Visit (INDEPENDENT_AMBULATORY_CARE_PROVIDER_SITE_OTHER): Payer: Medicare Other | Admitting: Podiatry

## 2021-03-18 DIAGNOSIS — B351 Tinea unguium: Secondary | ICD-10-CM

## 2021-03-18 DIAGNOSIS — M79674 Pain in right toe(s): Secondary | ICD-10-CM

## 2021-03-18 DIAGNOSIS — M79675 Pain in left toe(s): Secondary | ICD-10-CM | POA: Diagnosis not present

## 2021-03-18 DIAGNOSIS — G609 Hereditary and idiopathic neuropathy, unspecified: Secondary | ICD-10-CM | POA: Diagnosis not present

## 2021-03-21 ENCOUNTER — Other Ambulatory Visit: Payer: Self-pay | Admitting: Family Medicine

## 2021-03-22 NOTE — Progress Notes (Signed)
Subjective: Lauren Wood is a pleasant 60 y.o. female patient seen today for thick, elongated toenails b/l feet which are tender when wearing enclosed shoe gear. She has h/o neuropathy managed with pregabalin.  Patient is also here to pick up purchased diabetic shoes. She is not diabetic. Her sister, Lauren Wood, is her guardian.  PCP is [name], MD. Last visit was: 11/19/2020.  Allergies  Allergen Reactions   Enalapril Maleate Other (See Comments)    Unknown reaction   Hctz [Hydrochlorothiazide] Other (See Comments)    Hyperuricemia with Lasix    Lasix [Furosemide] Other (See Comments)    Hyperuricemia with HCTZ      Objective: Physical Exam  General: Lauren Wood is a pleasant 60 y.o. African American female,  morbidly obese in NAD. AAO x 3.   Vascular:  Capillary refill time to digits immediate b/l. Palpable DP pulse(s) b/l lower extremities Palpable PT pulse(s) b/l lower extremities Pedal hair sparse. No pain with calf compression b/l. Lower extremity skin temperature gradient within normal limits. Nonpitting edema noted BLE.   Dermatological:  Toenails 1-5 b/l elongated, discolored, dystrophic, thickened, crumbly with subungual debris and tenderness to dorsal palpation. Pedal skin warm and supple b/l. No interdigital macerations noted b/l. No open wounds b/l.  Musculoskeletal:  No gross bony deformities b/l lower extremities. Muscle strength 5/5 to all muscle groups b/l. No pain, crepitus, nor joint limitation noted with ROM b/l.   Neurological:  Pt has subjective symptoms of neuropathy. Patient unable to follow commands of LE neurological examination due to cognitive deficits. Patient does respond to external noxious stimuli. Clonus negative b/l.   Assessment and Plan:  1. Pain due to onychomycosis of toenails of both feet   2. Hereditary and idiopathic peripheral neuropathy     Patient was evaluated and treated and all questions answered. Consent given for  treatment as described below: -No new findings. No new orders. -Dispensed one pair diabetic shoes and 3 pair total contact insoles. Shoes were appropriate fit with no heel slippage. Reviewed warranty information and guardian signed all paperwork stating patient received shoes, insert(s)/filler(s), break-in instructions and warranty information. Patient instructed not to wear shoes outside unless completely satisfied. Patient related understanding.  -Toenails 1-5 b/l were debrided in length and girth with sterile nail nippers and dremel without iatrogenic bleeding.  -Patient/POA to call should there be question/concern in the interim.  Return in about 3 months (around 06/18/2021).  Marzetta Board, DPM

## 2021-03-24 NOTE — Progress Notes (Deleted)
    SUBJECTIVE:   Chief compliant/HPI: annual examination  Lauren Wood is a 60 y.o. who presents today for an annual exam.    History tabs reviewed and updated ***.   Review of systems form reviewed and notable for ***.   Past Medical History:  Diagnosis Date   ALLERGIC RHINITIS 08/02/2009   Arthritis    Boil of vulva 11/07/2012   Borderline personality disorder ([place]) 05/31/2011   CKD (chronic kidney disease) stage 3, GFR 30-59 ml/min (HCC)    chronic   Gout of foot 09/26/2013   L foot 09/2013. In the setting of HCTZ 25 mg and lasix 40 mg daily. Stopped HCTZ after decreasing to 12.5 mg. Treated with colchicine.     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      PRESSURE ULCER OTHER SITE 10/01/2009   hx of   Sleep apnea    uses cpap, setting of 2   Unspecified psychosis 12/20/2009     OBJECTIVE:   LMP 05/08/2013  *** General: Awake, alert, oriented, in no acute distress, pleasant and cooperative with examination HEENT: Normocephalic, atraumatic, nares patent, dentition is good, oropharynx without erythema or exudates, TM's clear bilaterally, no thyroid nodules palpated Cardio: RRR without murmur, 2+ radial, DP and PT pulses b/l Respiratory: CTAB without wheezing/rhonchi/rales Abdomen: Soft, non-tender to palpation of all quadrants, non-distended, no rebound/guarding, no organomegaly MSK: Able to move all extremities spontaneously, good muscle strength, no abnormalities Extremities: without edema or cyanosis Neuro: Speech is clear and intact, no focal deficits, no facial asymmetry, follows commands  Psych: Normal mood and affect   ASSESSMENT/PLAN:   No problem-specific Assessment &  Plan notes found for this encounter.    Annual Examination  See AVS for age appropriate recommendations  PHQ score ***, reviewed and discussed.  BP reviewed and at goal ***.  Asked about intimate partner violence and resources given as appropriate  Advance directives discussion ***  Considered the following items based upon USPSTF recommendations: Diabetes screening: ordered Screening for elevated cholesterol: ordered HIV testing:  previously ordered and negative Hepatitis C:  previously ordered and negative Hepatitis B: discussed and low-risk  *** Syphilis if at high risk: discussed, low-risk*** GC/CT not at high risk and not ordered.*** Osteoporosis screening considered based upon risk of fracture from [facility] calculator. Major osteoporotic fracture risk is ***%. DEXA {ordered not order:23822}.  Reviewed risk factors for latent tuberculosis and not indicated ***   Discussed family history, BRCA testing {not indicated/requested/declined:14582}. Tool used to risk stratify was ***.  Cervical cancer screening: due for Pap today, cytology + HPV ordered *** Breast cancer screening:  up to date, next due 09/2021 Colorectal cancer screening:  due for repeat colonoscopy - last one was in May 2017 and she was recommended to return in 5 years.  Lung cancer screening: {discussed/declined/written MCEY:22336}. See documentation below regarding indications/risks/benefits.  Vaccinations: Offered influenza vaccine and ***. Shingrix Rx sent to pharmacy. ***   Follow up in 1 year or sooner if indicated.    [name], [place]

## 2021-03-24 NOTE — Patient Instructions (Incomplete)
It was wonderful to see you today.  Please bring ALL of your medications with you to every visit.   Today we talked about:  -We are doing lab work today to check your cholesterol. I will send you a MyChart message if you have MyChart. Otherwise, I will give you a call for abnormal results or send a letter if everything returned back normal. If you don't hear from me in 2 weeks, please call the office.   -We did a hemoglobin A1c test today to check for diabetes, this was normal which is good! -You are overdue for a colonoscopy. Please call your Gastroenterologist, Dr. Silvano Rusk with [facility] for an appointment. Their phone number is [phone]. -You received your flu shot today. I sent a prescription for your shingles vaccine to your pharmacy.     Thank you for choosing [facility].   Please call [phone] with any questions about today's appointment.  Please be sure to schedule follow up at the front  desk before you leave today.   [name], DO PGY-2 Family Medicine

## 2021-03-25 ENCOUNTER — Ambulatory Visit: Payer: Medicare Other | Admitting: Family Medicine

## 2021-04-19 NOTE — Progress Notes (Signed)
SUBJECTIVE:   Chief compliant/HPI: annual examination  Lauren Wood is a 59 y.o. female with PMHx of HTN, mental retardation, BPD, arthritis, morbid obesity, sleep apnea, and CKD stage 3b who presents today for an annual exam.   She presents with Lauren Wood (close family friend).  Had some episodes of diarrhea that was off and on. States that stools are yellow and are watery. Legs give out on her sometimes.  She uses some oxygen at the Day Program she is in and also at night (unsure how much).   History tabs reviewed and updated.   Review of systems form reviewed and notable for no SOB, chest pain, abdominal pain, bloody stools, nausea, vomiting.   OBJECTIVE:   BP 134/76   Pulse 80   Wt (!) 306 lb (138.8 kg)   LMP 05/08/2013   SpO2 97%   BMI 52.52 kg/m   General: Awake, alert, oriented, in no acute distress, pleasant and cooperative with examination HEENT: Normocephalic, atraumatic, nares patent, dentition is poor, oropharynx without erythema or exudates, left hearing aid present, no thyroid nodules palpated Cardio: RRR without murmur, 2+ radial, DP and PT pulses b/l Respiratory: CTAB without wheezing/rhonchi/rales Abdomen: Soft, non-tender to palpation of all quadrants, non-distended, no rebound/guarding, no organomegaly MSK: Able to move all extremities spontaneously, good muscle strength, no abnormalities Extremities: without edema or cyanosis Neuro: Speech is clear and intact, no focal deficits, no facial asymmetry, follows commands, normal gait  Psych: Normal mood and affect  Depression screen [facility] 2/9 04/20/2021 10/26/2020 08/19/2020  Decreased Interest 0 3 -  Down, Depressed, Hopeless 0 0 0  PHQ - 2 Score 0 3 0  Altered sleeping 1 3 3   Tired, decreased energy 1 3 2   Change in appetite 1 3 0  Feeling bad or failure about yourself  0 0 0  Trouble concentrating 0 0 0  Moving slowly or fidgety/restless 0 0 0  Suicidal thoughts 0 0 0  PHQ-9 Score 3 12 5   Some  recent data might be hidden   Filled out by caretaker  ASSESSMENT/PLAN:   Annual Examination  See AVS for age appropriate recommendations  PHQ score 3, reviewed and discussed.  BP reviewed and not at goal but near goal.  Asked about intimate partner violence and resources given as appropriate  Advance directives discussion had. Paperwork was provided.   Considered the following items based upon USPSTF recommendations: Diabetes screening: ordered Screening for elevated cholesterol: ordered HIV testing:  previously performed and negative. Low-risk, will not reorder. Hepatitis C:  previously performed and negative. Not high risk, will not reorder. Hepatitis B:  discussed and low risk.  Syphilis if at high risk:  discussed and low-risk.  GC/CT not at high risk and not ordered. Osteoporosis screening considered based upon risk of fracture from [facility] calculator. Major osteoporotic fracture risk is 4.3%. DEXA ordered.  Reviewed risk factors for latent tuberculosis and not indicated   Discussed family history, BRCA testing not indicated.  Cervical cancer screening:  due but patient preferred to have this rescheduled. Scheduled with me on 12/19. Breast cancer screening:  up to date, due June 2023 Colorectal cancer screening:  had colonoscopy in May 2017, a single tubular adenoma was found and patient was recommended to have repeat colonoscopy in 2022. Has not yet had.  Lung cancer screening:  no smoking history .  Vaccinations Flu vaccine administered, Zoster vaccine sent to pharmacy.   Follow up in 1  year or sooner if indicated.  Diarrhea Present for the last couple days.  Abdominal exam was unremarkable.  No nausea or vomiting accompanying this.  She has had diarrhea in the past that was thought to be possibly related to her Ozempic.  Discussed conservative measures and return precautions.  If her diarrhea persists for greater than week advised her to come back so that we can do stool  studies.  She is overdue for repeat colonoscopy and was advised to contact her GI physician to schedule this as well.    [name], [place]

## 2021-04-19 NOTE — Patient Instructions (Addendum)
It was wonderful to meet you today.  Please bring ALL of your medications with you to every visit.   Today at your annual preventive visit we talked about the following measures:  Your blood pressure is 134/76, goal is <130/80.   You are due for a repeat colonoscopy. Please reach out to your GI physician, Dr. Carlean Purl with [facility].  [address], [place], [place] [address] [phone]  We are doing lab work today to check for diabetes and to check your cholesterol. I will send you a MyChart message if you have MyChart. Otherwise, I will give you a call for abnormal results or send a letter if everything returned back normal. If you don't hear from me in 2 weeks, please call the office.    We are sending you with some paperwork to discuss advance directives. This includes your wishes, should anything horrible happen and who you would like as your healthcare power of attorney.   Thank you for choosing [facility].   Please call [phone] with any questions about today's appointment.  Please be sure to schedule follow up at the front  desk before you leave today.   [name], DO PGY-2 Family Medicine

## 2021-04-20 ENCOUNTER — Ambulatory Visit (INDEPENDENT_AMBULATORY_CARE_PROVIDER_SITE_OTHER): Payer: Medicare Other | Admitting: Family Medicine

## 2021-04-20 ENCOUNTER — Encounter: Payer: Self-pay | Admitting: Family Medicine

## 2021-04-20 ENCOUNTER — Other Ambulatory Visit: Payer: Self-pay

## 2021-04-20 VITALS — BP 134/76 | HR 80 | Wt 306.0 lb

## 2021-04-20 DIAGNOSIS — Z13820 Encounter for screening for osteoporosis: Secondary | ICD-10-CM

## 2021-04-20 DIAGNOSIS — Z23 Encounter for immunization: Secondary | ICD-10-CM | POA: Diagnosis not present

## 2021-04-20 DIAGNOSIS — Z0000 Encounter for general adult medical examination without abnormal findings: Secondary | ICD-10-CM | POA: Diagnosis not present

## 2021-04-20 DIAGNOSIS — Z8639 Personal history of other endocrine, nutritional and metabolic disease: Secondary | ICD-10-CM | POA: Diagnosis not present

## 2021-04-20 DIAGNOSIS — R197 Diarrhea, unspecified: Secondary | ICD-10-CM | POA: Diagnosis not present

## 2021-04-20 DIAGNOSIS — Z780 Asymptomatic menopausal state: Secondary | ICD-10-CM

## 2021-04-20 LAB — POCT GLYCOSYLATED HEMOGLOBIN (HGB A1C): Hemoglobin A1C: 5.1 % (ref 4.0–5.6)

## 2021-04-20 MED ORDER — ZOSTER VAC RECOMB ADJUVANTED 50 MCG/0.5ML IM SUSR: 0.5000 mL | Freq: Once | INTRAMUSCULAR | 0 refills | Status: DC

## 2021-04-20 MED ORDER — ZOSTER VAC RECOMB ADJUVANTED 50 MCG/0.5ML IM SUSR: 0.5000 mL | Freq: Once | INTRAMUSCULAR | 0 refills | Status: AC

## 2021-04-20 NOTE — Assessment & Plan Note (Signed)
Present for the last couple days.  Abdominal exam was unremarkable.  No nausea or vomiting accompanying this.  She has had diarrhea in the past that was thought to be possibly related to her Ozempic.  Discussed conservative measures and return precautions.  If her diarrhea persists for greater than week advised her to come back so that we can do stool studies.  She is overdue for repeat colonoscopy and was advised to contact her GI physician to schedule this as well.

## 2021-04-21 LAB — LIPID PANEL
Chol/HDL Ratio: 3.4 ratio (ref 0.0–4.4)
Cholesterol, Total: 94 mg/dL — ABNORMAL LOW (ref 100–199)
HDL: 28 mg/dL — ABNORMAL LOW (ref >39)
LDL Chol Calc (NIH): 49 mg/dL (ref 0–99)
Triglycerides: 82 mg/dL (ref 0–149)
VLDL Cholesterol Cal: 17 mg/dL (ref 5–40)

## 2021-05-01 NOTE — Progress Notes (Deleted)
° ° °  SUBJECTIVE:   CHIEF COMPLAINT / HPI:   Pap Smear  Lauren Wood is a 60 y.o. female who presents to the [facility] clinic today for a Pap smear.   PERTINENT  PMH / PSH:  Past Medical History:  Diagnosis Date   ALLERGIC RHINITIS 08/02/2009   Arthritis    Boil of vulva 11/07/2012   Borderline personality disorder ([place]) 05/31/2011   CKD (chronic kidney disease) stage 3, GFR 30-59 ml/min (HCC)    chronic   Gout of foot 09/26/2013   L foot 09/2013. In the setting of HCTZ 25 mg and lasix 40 mg daily. Stopped HCTZ after decreasing to 12.5 mg. Treated with colchicine.     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      PRESSURE ULCER OTHER SITE 10/01/2009   hx of   Sleep apnea    uses cpap, setting of 2   Unspecified psychosis 12/20/2009     OBJECTIVE:   LMP 05/08/2013  ***  General: Obese african [place] female, in no distress, appears stated age Respiratory: Breathing comfortably on room air Abdomen: Bowel sounds present, nontender, nondistended, no hepatosplenomegaly. GU: Normal appearance of labia majora and minora, without lesions. Vagina tissue pink, moist, without lesions or abrasions. Cervix normal appearance, non-friable, without discharge from os.  Extremities: no edema or cyanosis. Skin: warm and dry, no rashes noted Neuro: alert, no obvious focal deficits Psych: Normal affect and mood  GU Exam chaperoned by *** CMA  ASSESSMENT/PLAN:   No problem-specific Assessment & Plan notes found for this encounter.     Lauren Wood, [place]

## 2021-05-01 NOTE — Patient Instructions (Incomplete)
It was wonderful to see you today.  Please bring ALL of your medications with you to every visit.   Today we talked about:  -We did a Pap smear today. If normal, you may not need another.    Thank you for choosing [facility].   Please call [phone] with any questions about today's appointment.  Please be sure to schedule follow up at the front  desk before you leave today.   [name], DO PGY-2 Family Medicine

## 2021-05-02 ENCOUNTER — Ambulatory Visit: Payer: Medicare Other | Admitting: Family Medicine

## 2021-05-04 ENCOUNTER — Other Ambulatory Visit: Payer: Self-pay

## 2021-05-04 ENCOUNTER — Other Ambulatory Visit: Payer: Self-pay | Admitting: Family Medicine

## 2021-05-04 MED ORDER — OMEPRAZOLE 20 MG PO CPDR: DELAYED_RELEASE_CAPSULE | ORAL | 3 refills | Status: DC

## 2021-05-05 ENCOUNTER — Other Ambulatory Visit: Payer: Self-pay | Admitting: Family Medicine

## 2021-05-05 NOTE — Progress Notes (Signed)
SUBJECTIVE:   CHIEF COMPLAINT / HPI:   Cough Lauren Wood is a 60 y.o. female who presents to the clinic with her sister today to discuss an ongoing cough x1 week. [name] states that stuff comes up but she swallows it. The cough seems worse at night. Her breathing does not seem to have been affected. Her sister notes that she goes to a day program and is unsure if she caught something there. No fever, sore throat, runny nose or other symptoms. She has been using a generic version of Robitussin without relief.   Cervical Cancer Screening Last Pap smear was in 2016 and normal. Sister reports that patient has never been sexually active.   PERTINENT  PMH / PSH:  Past Medical History:  Diagnosis Date   ALLERGIC RHINITIS 08/02/2009   Arthritis    Boil of vulva 11/07/2012   Borderline personality disorder ([place]) 05/31/2011   CKD (chronic kidney disease) stage 3, GFR 30-59 ml/min (HCC)    chronic   Gout of foot 09/26/2013   L foot 09/2013. In the setting of HCTZ 25 mg and lasix 40 mg daily. Stopped HCTZ after decreasing to 12.5 mg. Treated with colchicine.     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 Skin: warm and dry, no rashes noted   ASSESSMENT/PLAN:   Cough For the last week without associated symptoms.  Appears to be worsened at night. She was not coughing during encounter. Differential includes viral vs. Asthma vs GERD vs post-nasal drip. Lung exam is clear without wheezing. No history of asthma previously documented. SpO2 94% but suspect this was a lower reading due to cold fingers. She is breathing comfortably without any distress. She reports no SOB. She is on PPI for GERD. Given that she is at a day program, its possible she could have contacted a viral infection. -Rx Tessalon Perles -Rx Flonase for post-nasal drip -Return precautions discussed   Health care maintenance Pap smear last performed in 2016 that was normal. Although sister states that patient has not been sexually active, it is reasonable to do one last Pap as patient has had two prior Pap smears. Discussed Shingrix was previously sent to pharmacy- sister aware and will take patient for vaccine. Discussed that she is due for colonoscopy and sister to call GI for appointment. DEXA previously ordered- sister to call for appointment.  [name], [place]

## 2021-05-06 ENCOUNTER — Other Ambulatory Visit: Payer: Self-pay | Admitting: Family Medicine

## 2021-05-10 ENCOUNTER — Ambulatory Visit (INDEPENDENT_AMBULATORY_CARE_PROVIDER_SITE_OTHER): Payer: Medicare Other | Admitting: Family Medicine

## 2021-05-10 ENCOUNTER — Other Ambulatory Visit: Payer: Self-pay

## 2021-05-10 ENCOUNTER — Encounter: Payer: Self-pay | Admitting: Family Medicine

## 2021-05-10 VITALS — BP 130/70 | HR 97 | Temp 99.4°F | Ht 64.0 in | Wt 299.4 lb

## 2021-05-10 DIAGNOSIS — R051 Acute cough: Secondary | ICD-10-CM | POA: Diagnosis not present

## 2021-05-10 DIAGNOSIS — R059 Cough, unspecified: Secondary | ICD-10-CM | POA: Insufficient documentation

## 2021-05-10 DIAGNOSIS — Z0000 Encounter for general adult medical examination without abnormal findings: Secondary | ICD-10-CM

## 2021-05-10 MED ORDER — FLUTICASONE PROPIONATE 50 MCG/ACT NA SUSP: 2.0000 | Freq: Every day | NASAL | 6 refills | Status: DC

## 2021-05-10 MED ORDER — BENZONATATE 100 MG PO CAPS: 200.0000 mg | ORAL_CAPSULE | Freq: Three times a day (TID) | ORAL | 0 refills | Status: DC | PRN

## 2021-05-10 NOTE — Assessment & Plan Note (Addendum)
Pap smear last performed in 2016 that was normal. Although sister states that patient has not been sexually active, it is reasonable to do one last Pap as patient has had two prior Pap smears. Discussed Shingrix was previously sent to pharmacy- sister aware and will take patient for vaccine. Discussed that she is due for colonoscopy and sister to call GI for appointment. DEXA previously ordered- sister to call for appointment.

## 2021-05-10 NOTE — Assessment & Plan Note (Addendum)
For the last week without associated symptoms.  Appears to be worsened at night. She was not coughing during encounter. Differential includes viral vs. Asthma vs GERD vs post-nasal drip. Lung exam is clear without wheezing. No history of asthma previously documented. SpO2 94% but suspect this was a lower reading due to cold fingers. She is breathing comfortably without any distress. She reports no SOB. She is on PPI for GERD. Given that she is at a day program, its possible she could have contacted a viral infection. -Rx Tessalon Perles -Rx Flonase for post-nasal drip -Return precautions discussed

## 2021-05-10 NOTE — Patient Instructions (Signed)
It was wonderful to see you today.  Please bring ALL of your medications with you to every visit.   Today we talked about:  -Schedule an appointment for her DEXA (bone density) scan at [facility].  -Call the GI doctor to schedule her colonoscopy. -Her shingles vaccine was sent to [facility] -She does not need a Pap smear -Her cough is likely from a viral infection. I am sending some Tessalon Perles to help. She can take this up to 3 times a day as needed. If the cough persists or worsens, or if her breathing is affected, please return.   Thank you for choosing [facility].   Please call [phone] with any questions about today's appointment.  Please be sure to schedule follow up at the front  desk before you leave today.   [name], DO PGY-2 Family Medicine

## 2021-05-18 DIAGNOSIS — I5042 Chronic combined systolic (congestive) and diastolic (congestive) heart failure: Secondary | ICD-10-CM | POA: Insufficient documentation

## 2021-05-18 NOTE — Progress Notes (Signed)
Cardiology Office Note   Date:  05/19/2021   ID:  Lauren Wood, DOB Feb 15, 1961, MRN [id number]  PCP:  [name], MD  Cardiologist:   Minus Breeding, MD   Chief Complaint  Patient presents with   Shortness of Breath      History of Present Illness: Lauren Wood is a 61 y.o. female who has a history of chronic systolic HF.    She is on home O2.  She gets around with a walker.  She has maintained about a 50 pound weight loss over the years if she is gained a little bit back.  She still gets dyspneic sporadically at night and wears oxygen as needed.  She has low oxygen saturations as recorded below.  She is limited by predominantly left knee pain.  She sleeps chronically on 4 pillows.  She is not having any new chest pressure, neck or arm discomfort.  She is not having any new palpitations, presyncope or syncope.  She has had no new edema.   Past Medical History:  Diagnosis Date   ALLERGIC RHINITIS 08/02/2009   Arthritis    Boil of vulva 11/07/2012   Borderline personality disorder ([place]) 05/31/2011   CKD (chronic kidney disease) stage 3, GFR 30-59 ml/min (HCC)    chronic   Gout of foot 09/26/2013   L foot 09/2013. In the setting of HCTZ 25 mg and lasix 40 mg daily. Stopped HCTZ after decreasing to 12.5 mg. Treated with colchicine.     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      PRESSURE ULCER OTHER SITE 10/01/2009   hx of   Sleep apnea    uses cpap, setting of 2   Unspecified psychosis 12/20/2009    Past Surgical History:  Procedure Laterality Date   COLONOSCOPY N/A 06/24/2013   Procedure: COLONOSCOPY;  Surgeon: Gatha Mayer, MD;  Location: [facility];  Service: Endoscopy;   Laterality: N/A;   COLONOSCOPY WITH PROPOFOL N/A 10/05/2015   Procedure: COLONOSCOPY WITH PROPOFOL;  Surgeon: Gatha Mayer, MD;  Location: [facility];  Service: Endoscopy;  Laterality: N/A;   cyst removed  2011   forehead   TEE WITHOUT CARDIOVERSION N/A 03/19/2020   Procedure: TRANSESOPHAGEAL ECHOCARDIOGRAM (TEE);  Surgeon: Lelon Perla, MD;  Location: [facility] ENDOSCOPY;  Service: Cardiovascular;  Laterality: N/A;   TYMPANOSTOMY TUBE PLACEMENT Bilateral yrs ago     Current Outpatient Medications  Medication Sig Dispense Refill   acetaminophen (TYLENOL) 500 MG tablet Take 500 mg by mouth every 6 (six) hours as needed for headache.      allopurinol (ZYLOPRIM) 100 MG tablet TAKE ONE TABLET EACH DAY 90 tablet 0   aspirin (QC LO-DOSE ASPIRIN) 81 MG EC tablet Take 1 tablet (81 mg total) by mouth daily. Swallow whole. 30 tablet 11   benzonatate (TESSALON PERLES) 100 MG capsule Take 2 capsules (200 mg total) by mouth 3 (three) times daily as needed for cough. 20 capsule 0   buPROPion (WELLBUTRIN XL) 150 MG 24 hr tablet Take 150 mg by mouth every morning.     clonazePAM (KLONOPIN) 0.5 MG tablet Take 0.5  tablets (0.25 mg total) by mouth daily as needed for anxiety. 30 tablet 3   colchicine 0.6 MG tablet TAKE ONE-HALF TABLET EVERY OTHER DAY FORGOUT FOR 3 MONTHS 30 tablet 3   docusate sodium (DOK) 100 MG capsule TAKE ONE CAPSULE TWICE DAILY AS NEEDED FOR MILD OR MODERATE CONSTIPATION 60 capsule 3   FLUoxetine (PROZAC) 40 MG capsule Take 1 capsule (40 mg total) by mouth every morning. 30 capsule 3   fluticasone (FLONASE) 50 MCG/ACT nasal spray Place 2 sprays into both nostrils daily. 16 g 6   furosemide (LASIX) 80 MG tablet Take 1 tablet (80 mg total) by mouth daily. 90 tablet 3   losartan (COZAAR) 25 MG tablet Take 1 tablet (25 mg total) by mouth daily. 30 tablet 0   metoprolol tartrate (LOPRESSOR) 100 MG tablet TAKE ONE TABLET TWICE DAILY 60 tablet 0   mirtazapine (REMERON) 30 MG tablet Take 1  tablet (30 mg total) by mouth at bedtime. 30 tablet 3   omeprazole (PRILOSEC) 20 MG capsule TAKE ONE CAPSULE EACH DAY Strength: 20 mg 30 capsule 3   OZEMPIC, 0.25 OR 0.5 MG/DOSE, 2 MG/1.5ML SOPN INJECT 0.25MG  INTO SKIN ONCE A WEEK FOR 4 WEEKS THEN INCREASE TO 0.5MG  WEEKLY FOR AT LEAST 4 WEEKS 1.5 mL 0   prazosin (MINIPRESS) 1 MG capsule Take 1 capsule (1 mg total) by mouth at bedtime. 90 capsule 0   pregabalin (LYRICA) 50 MG capsule Take 1 capsule (50 mg total) by mouth daily. 30 capsule 3   risperiDONE (RISPERDAL) 3 MG tablet Take 3 mg by mouth 3 (three) times daily.     spironolactone (ALDACTONE) 25 MG tablet TAKE ONE TABLET EACH DAY 90 tablet 0   traZODone (DESYREL) 100 MG tablet Take 1 tablet (100 mg total) by mouth at bedtime. 30 tablet 3   benztropine (COGENTIN) 0.5 MG tablet Take 0.5 tablets (0.25 mg total) by mouth 2 (two) times daily. 30 tablet 3   ciprofloxacin (CILOXAN) 0.3 % ophthalmic solution 3 drops 2 (two) times daily. (Patient not taking: Reported on 05/19/2021)     dexamethasone (DECADRON) 0.1 % ophthalmic solution 2 drops 2 (two) times daily. (Patient not taking: Reported on 05/19/2021)     No current facility-administered medications for this visit.    Allergies:   Enalapril maleate, Hctz [hydrochlorothiazide], and Lasix [furosemide]    ROS:  Please see the history of present illness.   Otherwise, review of systems are positive for none.   All other systems are reviewed and negative.    PHYSICAL EXAM: VS:  BP (!) 154/82    Pulse 76    Wt 300 lb (136.1 kg)    LMP 05/08/2013    SpO2 95%    BMI 51.49 kg/m  , BMI Body mass index is 51.49 kg/m. GENERAL:  Well appearing NECK:  No jugular venous distention, waveform within normal limits, carotid upstroke brisk and symmetric, no bruits, no thyromegaly LUNGS:  Clear to auscultation bilaterally CHEST:  Unremarkable HEART:  PMI not displaced or sustained,S1 and S2 within normal limits, no S3, no S4, no clicks, no rubs, no  murmurs ABD:  Flat, positive bowel sounds normal in frequency in pitch, no bruits, no rebound, no guarding, no midline pulsatile mass, no hepatomegaly, no splenomegaly EXT:  2 plus pulses throughout, no edema, no cyanosis no clubbing  SATURATION QUALIFICATIONS: (This note is used to comply with regulatory documentation for home oxygen)  Patient Saturations on Room Air at Rest = 95%  Patient Saturations on  Room Air while Ambulating = 87%  Patient Saturations on 2 Liters of oxygen while Ambulating = 95%  Please briefly explain why patient needs home oxygen: Decreased oxygen saturations as above.  Dyspnea  EKG:  EKG is not ordered today. NA   Recent Labs: 09/10/2020: BNP 107.1 11/01/2020: ALT 5; Hemoglobin 11.9; Platelets 274 11/19/2020: BUN 13; Creatinine, Ser 1.19; Potassium 3.9; Sodium 143    Lipid Panel    Component Value Date/Time   CHOL 94 (L) 04/20/2021 1123   TRIG 82 04/20/2021 1123   HDL 28 (L) 04/20/2021 1123   CHOLHDL 3.4 04/20/2021 1123   CHOLHDL 3.2 05/11/2018 0310   VLDL 7 05/11/2018 0310   LDLCALC 49 04/20/2021 1123      Wt Readings from Last 3 Encounters:  05/19/21 300 lb (136.1 kg)  05/10/21 299 lb 6.4 oz (135.8 kg)  04/20/21 (!) 306 lb (138.8 kg)      Other studies Reviewed: Additional studies/ records that were reviewed today include: Labs Review of the above records demonstrates:  Please see elsewhere in the note.     ASSESSMENT AND PLAN:  CHRONIC SYSTOLIC AND  DIASTOLIC HF:   EF was 35 - 40% on TEE she seems to be euvolemic.  I am going to increase her Cozaar to 50 mg daily.  CKD III: Her creatinine was most recently 1.19.  I will check a basic metabolic profile about 2 weeks that she has had renal insufficiency.  We have previously been avoiding ACE inhibitor's or ARB's but she seems to be tolerating it now.   HTN: The blood pressure is being controlled in the context of managing her reduced ejection fraction.  I am going to ask our team to send  her an extra large blood pressure cuff.  MORBID OBESITY:   I am proud of her weight loss.   MR:   This was severe in 2021 on TEE.  She was not thought to be a surgical candidate and would be even high risk potentially for MitraClip.   I might follow this with a repeat transthoracic echo in the future however once we have achieved further med titration.   Current medicines are reviewed at length with the patient today.  The patient does not have concerns regarding medicines.  The following changes have been made: As above  Labs/ tests ordered today include:     No orders of the defined types were placed in this encounter.    Disposition:   FU with APP in 3 months.    Signed, Minus Breeding, MD  05/19/2021 4:54 PM    [facility]

## 2021-05-19 ENCOUNTER — Other Ambulatory Visit: Payer: Self-pay

## 2021-05-19 ENCOUNTER — Encounter: Payer: Self-pay | Admitting: Cardiology

## 2021-05-19 ENCOUNTER — Ambulatory Visit (INDEPENDENT_AMBULATORY_CARE_PROVIDER_SITE_OTHER): Payer: Medicare Other | Admitting: Cardiology

## 2021-05-19 VITALS — BP 154/82 | HR 76 | Wt 300.0 lb

## 2021-05-19 DIAGNOSIS — I340 Nonrheumatic mitral (valve) insufficiency: Secondary | ICD-10-CM

## 2021-05-19 DIAGNOSIS — Z79899 Other long term (current) drug therapy: Secondary | ICD-10-CM | POA: Diagnosis not present

## 2021-05-19 DIAGNOSIS — I10 Essential (primary) hypertension: Secondary | ICD-10-CM

## 2021-05-19 DIAGNOSIS — I5042 Chronic combined systolic (congestive) and diastolic (congestive) heart failure: Secondary | ICD-10-CM | POA: Diagnosis not present

## 2021-05-19 MED ORDER — LOSARTAN POTASSIUM 50 MG PO TABS: 50.0000 mg | ORAL_TABLET | Freq: Every day | ORAL | 3 refills | Status: DC

## 2021-05-19 NOTE — Patient Instructions (Addendum)
Medication Instructions:  INCREASE Losartan (Cozaar) to 50 mg daily *If you need a refill on your cardiac medications before your next appointment, please call your pharmacy*  Lab Work: Your physician recommends that you return for lab work in 2 week:  BMET If you have labs (blood work) drawn today and your tests are completely normal, you will receive your results only by: [employer] (if you have MyChart) OR A paper copy in the mail If you have any lab test that is abnormal or we need to change your treatment, we will call you to review the results.  Testing/Procedures: NONE ordered at this time of appointment   Follow-Up: At [facility], you and your health needs are our priority.  As part of our continuing mission to provide you with exceptional heart care, we have created designated Provider Care Teams.  These Care Teams include your primary Cardiologist (physician) and Advanced Practice Providers (APPs -  Physician Assistants and Nurse Practitioners) who all work together to provide you with the care you need, when you need it.  Your next appointment:   3 month(s)  The format for your next appointment:   In Person  Provider:   Almyra Deforest, PA-C        Other Instructions

## 2021-05-19 NOTE — Patient Instructions (Incomplete)
It was wonderful to see you today.  Please bring ALL of your medications with you to every visit.   Today we talked about:  -We did a Pap smear today. If normal, you will not need any further Pap smears. I will leg you know the result via MyChart.  Thank you for choosing [facility].   Please call [phone] with any questions about today's appointment.  Please be sure to schedule follow up at the front  desk before you leave today.   [name], DO PGY-2 Family Medicine

## 2021-05-19 NOTE — Progress Notes (Deleted)
° ° °  SUBJECTIVE:   CHIEF COMPLAINT / HPI:   Pap Smear Lauren Wood is a 61 y.o. female who presents to the [facility] clinic today for routine Pap smear. Last Pap in February 2016 was negative without HPV co-testing. Reports no abnormal vaginal discharge or discomfort. *** sexually active. STI testing offered and patient ***.   PERTINENT  PMH / PSH:  Past Medical History:  Diagnosis Date   ALLERGIC RHINITIS 08/02/2009   Arthritis    Boil of vulva 11/07/2012   Borderline personality disorder ([place]) 05/31/2011   CKD (chronic kidney disease) stage 3, GFR 30-59 ml/min (HCC)    chronic   Gout of foot 09/26/2013   L foot 09/2013. In the setting of HCTZ 25 mg and lasix 40 mg daily. Stopped HCTZ after decreasing to 12.5 mg. Treated with colchicine.     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      PRESSURE ULCER OTHER SITE 10/01/2009   hx of   Sleep apnea    uses cpap, setting of 2   Unspecified psychosis 12/20/2009     OBJECTIVE:   LMP 05/08/2013  ***  General: NAD, pleasant, able to participate in exam Respiratory: Breathing comfortably on room air Abdomen: Obese GU: Normal appearance of labia majora and minora, without lesions. Vagina tissue pink, moist, without lesions or abrasions. Cervix normal appearance, non-friable, without discharge from os.   GU Exam chaperoned by *** CMA.   ASSESSMENT/PLAN:   No problem-specific Assessment & Plan notes found for this encounter.     Lauren Wood, [place]

## 2021-05-20 ENCOUNTER — Telehealth: Payer: Self-pay

## 2021-05-20 NOTE — Telephone Encounter (Signed)
Spoke with patient's sister [name] and asked if she had checked with Medicare to see if they would provide an extra large blood pressure cuff for patient. If she is unable to secure one, she is to call our clinic so we can get one for her.

## 2021-05-23 ENCOUNTER — Ambulatory Visit: Payer: Medicare Other | Admitting: Family Medicine

## 2021-05-26 ENCOUNTER — Other Ambulatory Visit: Payer: Self-pay | Admitting: *Deleted

## 2021-05-26 DIAGNOSIS — I10 Essential (primary) hypertension: Secondary | ICD-10-CM

## 2021-05-27 MED ORDER — METOPROLOL TARTRATE 100 MG PO TABS: 100.0000 mg | ORAL_TABLET | Freq: Two times a day (BID) | ORAL | 0 refills | Status: DC

## 2021-05-30 ENCOUNTER — Other Ambulatory Visit: Payer: Self-pay

## 2021-05-30 DIAGNOSIS — E876 Hypokalemia: Secondary | ICD-10-CM

## 2021-06-02 ENCOUNTER — Encounter: Payer: Self-pay | Admitting: *Deleted

## 2021-06-02 ENCOUNTER — Other Ambulatory Visit: Payer: Self-pay | Admitting: *Deleted

## 2021-06-02 DIAGNOSIS — I5042 Chronic combined systolic (congestive) and diastolic (congestive) heart failure: Secondary | ICD-10-CM

## 2021-06-02 DIAGNOSIS — N1832 Chronic kidney disease, stage 3b: Secondary | ICD-10-CM

## 2021-06-02 LAB — BASIC METABOLIC PANEL
BUN/Creatinine Ratio: 15 (ref 12–28)
BUN: 24 mg/dL (ref 8–27)
CO2: 20 mmol/L (ref 20–29)
Calcium: 9.2 mg/dL (ref 8.7–10.3)
Chloride: 101 mmol/L (ref 96–106)
Creatinine, Ser: 1.61 mg/dL — ABNORMAL HIGH (ref 0.57–1.00)
Glucose: 71 mg/dL (ref 70–99)
Potassium: 4.8 mmol/L (ref 3.5–5.2)
Sodium: 146 mmol/L — ABNORMAL HIGH (ref 134–144)
eGFR: 36 mL/min/{1.73_m2} — ABNORMAL LOW (ref >59)

## 2021-06-02 NOTE — Progress Notes (Signed)
bmp 

## 2021-06-11 ENCOUNTER — Other Ambulatory Visit: Payer: Self-pay | Admitting: Family Medicine

## 2021-06-27 ENCOUNTER — Other Ambulatory Visit: Payer: Self-pay | Admitting: Family Medicine

## 2021-07-01 ENCOUNTER — Ambulatory Visit (INDEPENDENT_AMBULATORY_CARE_PROVIDER_SITE_OTHER): Payer: Self-pay | Admitting: Podiatry

## 2021-07-01 DIAGNOSIS — Z91199 Patient's noncompliance with other medical treatment and regimen due to unspecified reason: Secondary | ICD-10-CM

## 2021-07-07 NOTE — Progress Notes (Signed)
   Complete physical exam  Patient: Lauren Wood   DOB: 03/04/2000   61 y.o. Female  MRN: [id number]  Subjective:    No chief complaint on file.   Lauren Wood is a 61 y.o. female who presents today for a complete physical exam. She reports consuming a {diet types:17450} diet. {types:19826} She generally feels {DESC; WELL/FAIRLY WELL/POORLY:18703}. She reports sleeping {DESC; WELL/FAIRLY WELL/POORLY:18703}. She {does/does not:200015} have additional problems to discuss today.    Most recent fall risk assessment:    11/09/2021   10:42 AM  Fall Risk   Falls in the past year? 0  Number falls in past yr: 0  Injury with Fall? 0  Risk for fall due to : No Fall Risks  Follow up Falls evaluation completed     Most recent depression screenings:    11/09/2021   10:42 AM 09/30/2020   10:46 AM  PHQ 2/9 Scores  PHQ - 2 Score 0 0  PHQ- 9 Score 5     {VISON DENTAL STD PSA (Optional):27386}  {History (Optional):23778}  Patient Care Team: Jessup, Joy, NP as PCP - General (Nurse Practitioner)   Outpatient Medications Prior to Visit  Medication Sig   fluticasone (FLONASE) 50 MCG/ACT nasal spray Place 2 sprays into both nostrils in the morning and at bedtime. After 7 days, reduce to once daily.   norgestimate-ethinyl estradiol (SPRINTEC 28) 0.25-35 MG-MCG tablet Take 1 tablet by mouth daily.   Nystatin POWD Apply liberally to affected area 2 times per day   spironolactone (ALDACTONE) 100 MG tablet Take 1 tablet (100 mg total) by mouth daily.   No facility-administered medications prior to visit.    ROS        Objective:     There were no vitals taken for this visit. {Vitals History (Optional):23777}  Physical Exam   No results found for any visits on 12/15/21. {Show previous labs (optional):23779}    Assessment & Plan:    Routine Health Maintenance and Physical Exam  Immunization History  Administered Date(s) Administered   DTaP 05/18/1999, 07/14/1999,  09/22/1999, 06/07/2000, 12/22/2003   Hepatitis A 10/18/2007, 10/23/2008   Hepatitis B 03/05/1999, 04/12/1999, 09/22/1999   HiB (PRP-OMP) 05/18/1999, 07/14/1999, 09/22/1999, 06/07/2000   IPV 05/18/1999, 07/14/1999, 03/12/2000, 12/22/2003   Influenza,inj,Quad PF,6+ Mos 01/23/2014   Influenza-Unspecified 04/24/2012   MMR 03/12/2001, 12/22/2003   Meningococcal Polysaccharide 10/23/2011   Pneumococcal Conjugate-13 06/07/2000   Pneumococcal-Unspecified 09/22/1999, 12/06/1999   Tdap 10/23/2011   Varicella 03/12/2000, 10/18/2007    Health Maintenance  Topic Date Due   HIV Screening  Never done   Hepatitis C Screening  Never done   INFLUENZA VACCINE  12/13/2021   PAP-Cervical Cytology Screening  12/15/2021 (Originally 03/03/2020)   PAP SMEAR-Modifier  12/15/2021 (Originally 03/03/2020)   TETANUS/TDAP  12/15/2021 (Originally 10/22/2021)   HPV VACCINES  Discontinued   COVID-19 Vaccine  Discontinued    Discussed health benefits of physical activity, and encouraged her to engage in regular exercise appropriate for her age and condition.  Problem List Items Addressed This Visit   None Visit Diagnoses     Annual physical exam    -  Primary   Cervical cancer screening       Need for Tdap vaccination          No follow-ups on file.     Joy Jessup, NP   

## 2021-07-08 ENCOUNTER — Encounter: Payer: Self-pay | Admitting: Internal Medicine

## 2021-07-08 ENCOUNTER — Ambulatory Visit (INDEPENDENT_AMBULATORY_CARE_PROVIDER_SITE_OTHER): Payer: Medicare Other | Admitting: Internal Medicine

## 2021-07-08 VITALS — BP 140/88 | HR 76 | Ht 64.0 in | Wt 294.0 lb

## 2021-07-08 DIAGNOSIS — Z86010 Personal history of colonic polyps: Secondary | ICD-10-CM

## 2021-07-08 DIAGNOSIS — Z9981 Dependence on supplemental oxygen: Secondary | ICD-10-CM | POA: Diagnosis not present

## 2021-07-08 NOTE — Patient Instructions (Signed)
If you are age 62 or older, your body mass index should be between 23-30. Your Body mass index is 50.46 kg/m. If this is out of the aforementioned range listed, please consider follow up with your Primary Care Provider.  If you are age 14 or younger, your body mass index should be between 19-25. Your Body mass index is 50.46 kg/m. If this is out of the aformentioned range listed, please consider follow up with your Primary Care Provider.   ________________________________________________________  The [facility] providers would like to encourage you to use [facility] to communicate with providers for non-urgent requests or questions.  Due to long hold times on the telephone, sending your provider a message by [facility] may be a faster and more efficient way to get a response.  Please allow 48 business hours for a response.  Please remember that this is for non-urgent requests.  _______________________________________________________  We have added you to our wait list for a hospital procedure. We will be in touch soon.  It was a pleasure to see you today!  Thank you for trusting me with your gastrointestinal care!

## 2021-07-08 NOTE — Progress Notes (Signed)
Lauren Wood 61 y.o. 06/01/1960 [id number]  Assessment & Plan:   Encounter Diagnoses  Name Primary?   On home oxygen therapy Yes   Obesity, morbid, BMI 50 or higher (HCC)    Hx of adenomatous colonic polyps     I am unable to schedule her procedure at the hospital at this time due to staffing issues and lack of procedural time.  We are triaging based upon need for diagnostic and therapeutic procedures.  I will place her on my list and we will work her in when this is possible.  Her sister understands.  CC: [name], MD   Subjective:   Chief Complaint:  HPI 61 year old African-American woman with congestive heart failure chronic oxygen therapy and severe obesity who has a history of colon polyps as outlined below.  It is time for a surveillance exam.  Her procedures need to be done at the hospital due to comorbidities.  She has developmental delay/mental retardation and her sister, caregiver, is present today. 2015 8 and 20 mm adenomatous polyps removed 2017 2 mm separate adenoma no residual adenoma at hepatic flexure area   Allergies  Allergen Reactions   Enalapril Maleate Other (See Comments)    Unknown reaction   Hctz [Hydrochlorothiazide] Other (See Comments)    Hyperuricemia with Lasix    Lasix [Furosemide] Other (See Comments)    Hyperuricemia with HCTZ     Current Meds  Medication Sig   acetaminophen (TYLENOL) 500 MG tablet Take 500 mg by mouth every 6 (six) hours as needed for headache.    allopurinol (ZYLOPRIM) 100 MG tablet TAKE ONE TABLET EACH DAY   aspirin (QC LO-DOSE ASPIRIN) 81 MG EC tablet Take 1 tablet (81 mg total) by mouth daily. Swallow whole.   buPROPion (WELLBUTRIN XL) 150 MG 24 hr tablet Take 150 mg by mouth every morning.   clonazePAM (KLONOPIN) 0.5 MG tablet Take 0.5 tablets (0.25 mg total) by mouth daily as needed for anxiety.   colchicine 0.6 MG tablet TAKE ONE-HALF TABLET EVERY OTHER DAY FORGOUT FOR 3 MONTHS   docusate sodium (DOK)  100 MG capsule TAKE ONE CAPSULE TWICE DAILY AS NEEDED FOR MILD OR MODERATE CONSTIPATION   FLUoxetine (PROZAC) 40 MG capsule Take 1 capsule (40 mg total) by mouth every morning.   fluticasone (FLONASE) 50 MCG/ACT nasal spray Place 2 sprays into both nostrils daily.   furosemide (LASIX) 80 MG tablet Take 1 tablet (80 mg total) by mouth daily.   losartan (COZAAR) 50 MG tablet Take 1 tablet (50 mg total) by mouth daily.   metoprolol tartrate (LOPRESSOR) 100 MG tablet Take 1 tablet (100 mg total) by mouth 2 (two) times daily.   mirtazapine (REMERON) 30 MG tablet Take 1 tablet (30 mg total) by mouth at bedtime.   omeprazole (PRILOSEC) 20 MG capsule TAKE ONE CAPSULE EACH DAY Strength: 20 mg   OZEMPIC, 0.25 OR 0.5 MG/DOSE, 2 MG/1.5ML SOPN INJECT 0.5mg  INTO SKIN ONCE A WEEK FOR AT LEAST 4 WEEKS   pregabalin (LYRICA) 50 MG capsule Take 1 capsule (50 mg total) by mouth daily.   risperiDONE (RISPERDAL) 3 MG tablet Take 3 mg by mouth 3 (three) times daily.   spironolactone (ALDACTONE) 25 MG tablet TAKE ONE TABLET BY MOUTH ONCE DAILY   Past Medical History:  Diagnosis Date   ALLERGIC RHINITIS 08/02/2009   Arthritis    Boil of vulva 11/07/2012   Borderline personality disorder ([place]) 05/31/2011   CKD (chronic kidney disease) stage 3, GFR 30-59  ml/min ([place])    chronic   Gout of foot 09/26/2013   L foot 09/2013. In the setting of HCTZ 25 mg and lasix 40 mg daily. Stopped HCTZ after decreasing to 12.5 mg. Treated with colchicine.     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      PRESSURE ULCER OTHER SITE 10/01/2009   hx of   Sleep apnea    uses cpap, setting of 2   Unspecified psychosis 12/20/2009   Past Surgical  History:  Procedure Laterality Date   COLONOSCOPY N/A 06/24/2013   Procedure: COLONOSCOPY;  Surgeon: Gatha Mayer, MD;  Location: [facility] ENDOSCOPY;  Service: Endoscopy;  Laterality: N/A;   COLONOSCOPY WITH PROPOFOL N/A 10/05/2015   Procedure: COLONOSCOPY WITH PROPOFOL;  Surgeon: Gatha Mayer, MD;  Location: [facility] ENDOSCOPY;  Service: Endoscopy;  Laterality: N/A;   cyst removed  2011   forehead   TEE WITHOUT CARDIOVERSION N/A 03/19/2020   Procedure: TRANSESOPHAGEAL ECHOCARDIOGRAM (TEE);  Surgeon: Lelon Perla, MD;  Location: [facility] ENDOSCOPY;  Service: Cardiovascular;  Laterality: N/A;   TYMPANOSTOMY TUBE PLACEMENT Bilateral yrs ago   Social History   Social History Narrative   Goes to adult daycare.  Lives with sister.           family history includes Colon cancer in her father; Diabetes in her mother.   Review of Systems See HPI  Objective:   Physical Exam BP 140/88    Pulse 76    Ht 5\' 4"  (1.626 m)    Wt 294 lb (133.4 kg)    LMP 05/08/2013    SpO2 96%    BMI 50.46 kg/m  Very obese pleasant black woman in no acute distress Lungs clear Heart sounds are normal

## 2021-07-09 ENCOUNTER — Other Ambulatory Visit: Payer: Self-pay | Admitting: Family Medicine

## 2021-07-09 DIAGNOSIS — I10 Essential (primary) hypertension: Secondary | ICD-10-CM

## 2021-07-12 ENCOUNTER — Telehealth: Payer: Self-pay

## 2021-07-12 NOTE — Telephone Encounter (Signed)
Patient's sister calls nurse line requesting to schedule follow up for patient's depression. Reports that early this morning around 2 am, patient was awake and was saying, "I just want to give up."  Patient denies thoughts of self harm or SI.   Offered to schedule for next available appointment,however, patient's sister is requesting follow up with Dr. Posey Pronto. Scheduled with Dr. Posey Pronto for next available on 3/8. Provided sister with contact information for [facility] ([facility]   ED precautions given.   Talbot Grumbling, RN

## 2021-07-13 NOTE — Telephone Encounter (Signed)
Sorry to hear this. Glad you gave her BH precautions. I will follow up with them next week.  ?

## 2021-07-14 ENCOUNTER — Telehealth: Payer: Self-pay

## 2021-07-14 ENCOUNTER — Other Ambulatory Visit: Payer: Self-pay

## 2021-07-14 DIAGNOSIS — Z86010 Personal history of colonic polyps: Secondary | ICD-10-CM

## 2021-07-14 NOTE — Telephone Encounter (Signed)
Pt was scheduled for a Colonoscopy on 09/26/2021 with Dr. Carlean Purl at [facility] at 9:45 AM  ?Case # [id number]: Pt sister [name] made aware: ?Ambulatory GI referral sent in Epic ?Prep Instructions were created and sent to pt via My Chart and via Mail: ?[name] Made aware: ?[name]  verbalized understanding with all questions answered.  ? ? ? ?

## 2021-07-14 NOTE — Telephone Encounter (Signed)
-----   Message from Gatha Mayer, MD sent at 07/14/2021  9:36 AM EST ----- ?Regarding: colonoscopy 5/15 ?Please set up for this date at [facility] ? ?Hx colon polyps ? ?Will need to speak to sister who is caretaker ? ? ? ?

## 2021-07-18 ENCOUNTER — Other Ambulatory Visit: Payer: Self-pay | Admitting: Family Medicine

## 2021-07-19 NOTE — Progress Notes (Signed)
? ? ? ?  SUBJECTIVE:  ? ?CHIEF COMPLAINT / HPI:  ? ?Lauren Wood is a 61 y.o. female presents for depression follow up ? ?Accompanied by her sister. ? ?Poor sleep ?Per sister Torria is sleeping a lot. She has a hx of sleep apnea diagnosed a few years ago. She has been using CPAP at night but sister thinks its not working properly, she is having to replace the water 4-5 a night. Takes trazodone '100mg'$  nightly.  ? ?Low mood ?The other morning sister reports [name] was up very early. She felt like hurting herself but she said she wasn't going to do it. She didn't want to be here. She endorses some thoughts of death but does not want to act on it. 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  Hx of suicide attempts approx 10 years ago. Her mood has overall been better since staying with her sister. Last saw her psychiatrist in January. She is never alone. Always with one of her sisters with her or at day centre.   ? ?[facility] Office Visit from 05/10/2021 in [place]  ?PHQ-9 Total Score 3  ? ?  ?  ? ?Health Maintenance Due  ?Topic  ? Zoster Vaccines- Shingrix (1 of 2)  ? PAP SMEAR-Modifier   ? COVID-19 Vaccine (4 - Booster for [employer] series)  ? COLONOSCOPY (Pts 45-53yr Insurance coverage will need to be confirmed)   ? MAMMOGRAM   ? ?  ? ?PERTINENT  PMH / PSH: CHF, HTN, GERD, CKD ? ?OBJECTIVE:  ? ?BP 134/73   Pulse 78   Wt (!) 302 lb 6.4 oz (137.2 kg)   LMP 05/08/2013   SpO2 97%   BMI 51.91 kg/m?   ? ?General: Alert, no acute distress ?Cardio: well perfused ?Pulm: normal work of breathing ?Neuro: Cranial nerves grossly intact  ? ?ASSESSMENT/PLAN:  ? ?OSA (obstructive sleep apnea) ?On going sleep apnea with non functioning CPAP machine which may be contributing to day time sleepiness. Could also be that pt's psychiatric medications are contributing as well as her low mood. Recommended that sister calls  CPAP company for help to fix machine or I can place a new order for it.  ? ?Passive suicidal ideations ?Recent thoughts of death but no intention of plan. PHQ-3 today. Pt was much more open with me about her feelings today compared previous visits. I feel she would greatly benefit from therapy which I explained to sister. Pt is on prozac '40mg'$  currently. Will not make changes given that she is followed by psychiatry. Recommended sister reaches out to her psychiatrist for therapy +/- medication changes soon. Suicide hotline no provided plus other mental health resources. ? ?Health care maintenance ?Ordered mammogram for pt.  ?  ? ?PLattie Haw MD PGY-3 ?[facility] ?

## 2021-07-20 ENCOUNTER — Other Ambulatory Visit: Payer: Self-pay

## 2021-07-20 ENCOUNTER — Ambulatory Visit (INDEPENDENT_AMBULATORY_CARE_PROVIDER_SITE_OTHER): Payer: Medicare Other | Admitting: Family Medicine

## 2021-07-20 ENCOUNTER — Encounter: Payer: Self-pay | Admitting: Family Medicine

## 2021-07-20 VITALS — BP 134/73 | HR 78 | Wt 302.4 lb

## 2021-07-20 DIAGNOSIS — G4733 Obstructive sleep apnea (adult) (pediatric): Secondary | ICD-10-CM | POA: Diagnosis not present

## 2021-07-20 DIAGNOSIS — Z1231 Encounter for screening mammogram for malignant neoplasm of breast: Secondary | ICD-10-CM | POA: Diagnosis not present

## 2021-07-20 DIAGNOSIS — R45851 Suicidal ideations: Secondary | ICD-10-CM

## 2021-07-20 DIAGNOSIS — Z0000 Encounter for general adult medical examination without abnormal findings: Secondary | ICD-10-CM

## 2021-07-20 NOTE — Patient Instructions (Signed)
Thank you for coming to see me today. It was a pleasure. Today we discussed her sleepiness-might be due to broken cpap machine, call them and let them know otherwise I can prescribe another one.  ? ?For her low mood, I recommend therapy, reach out to your psychiatrist for this.  ? ?Booked mammogram. ? ?If you are feeling suicidal or depression symptoms worsen please immediately go to:  ? ?If you are thinking about harming yourself or having thoughts of suicide, or if you know someone who is, seek help right away. ?If you are in crisis, make sure you are not left alone.  ?If someone else is in crisis, make sure he/she/they is not left alone ? ?Call 988 OR 1-800-273-TALK ? ?24 Hour Availability for Walk-IN services  ?[facility]  ?[place], [place] ?[place] [phone] ?Crisis [phone] ? ? ?Other crisis resources: ? ?[facility] [employer] ?(Domestic Violence, Rape & Victim Assistance ?[phone] ? ?[employer]    ?(ONLY from 8am-4pm)    ?[phone] ? ?[facility] (24/7)   ?[phone] ? ?[place] National Suicide Hotline   ?[phone] Diamantina Monks)  ? ?Please follow-up with me in 4 weeks  ? ?If you have any questions or concerns, please do not hesitate to call the office at [phone]. ? ?Best wishes,  ? ?Dr Posey Pronto   ?

## 2021-07-21 DIAGNOSIS — R45851 Suicidal ideations: Secondary | ICD-10-CM | POA: Insufficient documentation

## 2021-07-21 NOTE — Assessment & Plan Note (Signed)
Recent thoughts of death but no intention of plan. PHQ-3 today. Pt was much more open with me about her feelings today compared previous visits. I feel she would greatly benefit from therapy which I explained to sister. Pt is on prozac '40mg'$  currently. Will not make changes given that she is followed by psychiatry. Recommended sister reaches out to her psychiatrist for therapy +/- medication changes soon. Suicide hotline no provided plus other mental health resources. ?

## 2021-07-21 NOTE — Assessment & Plan Note (Signed)
On going sleep apnea with non functioning CPAP machine which may be contributing to day time sleepiness. Could also be that pt's psychiatric medications are contributing as well as her low mood. Recommended that sister calls CPAP company for help to fix machine or I can place a new order for it.  ?

## 2021-07-21 NOTE — Assessment & Plan Note (Signed)
Ordered mammogram for pt.  ?

## 2021-07-29 ENCOUNTER — Other Ambulatory Visit: Payer: Self-pay | Admitting: Family Medicine

## 2021-08-03 ENCOUNTER — Other Ambulatory Visit: Payer: Self-pay | Admitting: Family Medicine

## 2021-08-18 ENCOUNTER — Other Ambulatory Visit: Payer: Self-pay | Admitting: Family Medicine

## 2021-08-19 ENCOUNTER — Other Ambulatory Visit: Payer: Self-pay | Admitting: Family Medicine

## 2021-08-22 ENCOUNTER — Other Ambulatory Visit: Payer: Self-pay | Admitting: Family Medicine

## 2021-08-22 NOTE — Telephone Encounter (Signed)
Hey I already refilled these medications thank you ?

## 2021-08-23 ENCOUNTER — Ambulatory Visit: Payer: Medicare Other | Admitting: Family Medicine

## 2021-08-26 ENCOUNTER — Encounter: Payer: Self-pay | Admitting: Family Medicine

## 2021-08-26 ENCOUNTER — Ambulatory Visit: Payer: Medicare Other | Admitting: Physician Assistant

## 2021-08-26 ENCOUNTER — Ambulatory Visit (INDEPENDENT_AMBULATORY_CARE_PROVIDER_SITE_OTHER): Payer: Medicare Other | Admitting: Family Medicine

## 2021-08-26 VITALS — BP 114/64 | HR 77 | Temp 98.4°F | Resp 16 | Wt 295.8 lb

## 2021-08-26 DIAGNOSIS — N189 Chronic kidney disease, unspecified: Secondary | ICD-10-CM | POA: Diagnosis present

## 2021-08-26 DIAGNOSIS — R45851 Suicidal ideations: Secondary | ICD-10-CM | POA: Diagnosis not present

## 2021-08-26 DIAGNOSIS — Z0000 Encounter for general adult medical examination without abnormal findings: Secondary | ICD-10-CM

## 2021-08-26 NOTE — Patient Instructions (Addendum)
Thank you for coming to see me today. It was a pleasure. Today we discussed your weight loss and mood. I am so glad to see you are doing well. I recommend continue same dose of ozempic for now. Can increase the dose in a few weeks ? ?Dont forget to go to the mammogram and colonoscopy ? ?Shingle vaccines ? ?Repeating kidney function today, may need to refer to kidney doctors if your kidney function is worsening   ? ?Please follow-up with me in 3-4 weeks  ? ?If you have any questions or concerns, please do not hesitate to call the office at [phone]. ? ?Best wishes,  ? ?Dr Posey Pronto   ?

## 2021-08-26 NOTE — Progress Notes (Signed)
? ? ? ?  SUBJECTIVE:  ? ?CHIEF COMPLAINT / HPI:  ? ?Lauren Wood is a 61 y.o. female presents for follow up ? ?Sister present at visit ? ?Weight loss  ?Pt is taking ozempic 0.'5mg'$  and she lost 7lb since last visit. Her appetite has decreased and she is eating less. She has more energy. 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 Sister is encouraging her to walk at home too.  ? ?Mood ?Denies suicidal thoughts. Per sister she is back to herself and is happy. She is much more interactive at daycare. ? ?[place] Office Visit from 08/26/2021 in [place]  ?PHQ-9 Total Score 3  ? ?  ?  ? ?Health Maintenance Due  ?Topic  ? Zoster Vaccines- Shingrix (1 of 2)  ? PAP SMEAR-Modifier   ? COVID-19 Vaccine (4 - Booster for [employer] series)  ? COLONOSCOPY (Pts 45-69yr Insurance coverage will need to be confirmed)   ? MAMMOGRAM   ? ?  ?PERTINENT  PMH / PSH: CHF, HTN, GERD, OSA ? ?OBJECTIVE:  ? ?BP 114/64   Pulse 77   Temp 98.4 ?F (36.9 ?C) (Oral)   Resp 16   Wt 295 lb 12.8 oz (134.2 kg)   LMP 05/08/2013   SpO2 100%   BMI 50.77 kg/m?   ? ?General: Alert, no acute distress ?Cardio: well perfused  ?Pulm: normal work of breathing ?Neuro: Cranial nerves grossly intact  ? ?ASSESSMENT/PLAN:  ? ?Passive suicidal ideations ?Resolved. Continue to monitor.  ? ?Morbid obesity ([place] ?Congratulated pt on weight loss. Will continue current dose of ozempic 0.'5mg'$  weekly for now given when she was on ozempic previously she experienced side effects. Will increase ozempic dose after a month or so if pt continues to tolerate this dose. Encourage daily walking. F/u in 1 month. ?  ? ?PLattie Haw MD PGY-3 ?[place]  ?

## 2021-08-26 NOTE — Progress Notes (Signed)
Patient would like to up the dose on her weight loss medication. Patient has loss a few pounds and is doing good. ? ? ?Patient has no other concern for provider. ?

## 2021-08-29 ENCOUNTER — Other Ambulatory Visit: Payer: Medicare Other

## 2021-09-01 NOTE — Assessment & Plan Note (Signed)
Resolved.  Continue to monitor.

## 2021-09-01 NOTE — Assessment & Plan Note (Signed)
Congratulated pt on weight loss. Will continue current dose of ozempic 0.'5mg'$  weekly for now given when she was on ozempic previously she experienced side effects. Will increase ozempic dose after a month or so if pt continues to tolerate this dose. Encourage daily walking. F/u in 1 month. ?

## 2021-09-01 NOTE — Assessment & Plan Note (Addendum)
Pt has mammogram and DEXA scan ordered. Screening colonoscopy is coming up in May.  ?

## 2021-09-07 ENCOUNTER — Ambulatory Visit: Payer: Medicare Other | Admitting: Physician Assistant

## 2021-09-16 ENCOUNTER — Other Ambulatory Visit: Payer: Self-pay | Admitting: Family Medicine

## 2021-09-19 ENCOUNTER — Encounter (HOSPITAL_COMMUNITY): Payer: Self-pay | Admitting: Internal Medicine

## 2021-09-19 NOTE — Progress Notes (Signed)
Attempted to obtain medical history via telephone, unable to reach at this time. Unable to leave voicemail(on guardian's number) to return pre surgical testing department's phone call,due to mailbox not setup.   ?

## 2021-09-20 ENCOUNTER — Ambulatory Visit: Payer: Medicare Other | Admitting: Family Medicine

## 2021-09-20 NOTE — Progress Notes (Deleted)
     SUBJECTIVE:   CHIEF COMPLAINT / HPI:   Lauren Wood is a 61 y.o. female presents for ***   ***  [facility] Office Visit from 08/26/2021 in [place]  PHQ-9 Total Score 3        Health Maintenance Due  Topic   Zoster Vaccines- Shingrix (1 of 2)   PAP SMEAR-Modifier    COVID-19 Vaccine (4 - Booster for [employer] series)   COLONOSCOPY (Pts 45-38yr Insurance coverage will need to be confirmed)    MAMMOGRAM       PERTINENT  PMH / PSH:   OBJECTIVE:   LMP 05/08/2013    General: Alert, no acute distress Cardio: Normal S1 and S2, RRR, no r/m/g Pulm: CTAB, normal work of breathing Abdomen: Bowel sounds normal. Abdomen soft and non-tender.  Extremities: No peripheral edema.  Neuro: Cranial nerves grossly intact   ASSESSMENT/PLAN:   No problem-specific Assessment & Plan notes found for this encounter.    PLattie Haw MD PGY-3 [facility]

## 2021-09-26 ENCOUNTER — Ambulatory Visit (HOSPITAL_COMMUNITY): Payer: Medicare Other | Admitting: Certified Registered"

## 2021-09-26 ENCOUNTER — Ambulatory Visit (HOSPITAL_BASED_OUTPATIENT_CLINIC_OR_DEPARTMENT_OTHER): Payer: Medicare Other | Admitting: Certified Registered"

## 2021-09-26 ENCOUNTER — Encounter (HOSPITAL_COMMUNITY)
Admission: RE | Disposition: A | Discharge status: Home / Self Care | Payer: Self-pay | Source: Home / Self Care | Attending: Internal Medicine

## 2021-09-26 ENCOUNTER — Ambulatory Visit (HOSPITAL_COMMUNITY)
Admission: RE | Admit: 2021-09-26 | Discharge: 2021-09-26 | Disposition: A | Discharge status: Home / Self Care | Payer: Medicare Other | Attending: Internal Medicine | Admitting: Internal Medicine

## 2021-09-26 ENCOUNTER — Other Ambulatory Visit: Payer: Self-pay

## 2021-09-26 DIAGNOSIS — Z1211 Encounter for screening for malignant neoplasm of colon: Secondary | ICD-10-CM | POA: Insufficient documentation

## 2021-09-26 DIAGNOSIS — K5730 Diverticulosis of large intestine without perforation or abscess without bleeding: Secondary | ICD-10-CM | POA: Insufficient documentation

## 2021-09-26 DIAGNOSIS — I110 Hypertensive heart disease with heart failure: Secondary | ICD-10-CM | POA: Diagnosis not present

## 2021-09-26 DIAGNOSIS — Z79899 Other long term (current) drug therapy: Secondary | ICD-10-CM | POA: Diagnosis not present

## 2021-09-26 DIAGNOSIS — I509 Heart failure, unspecified: Secondary | ICD-10-CM | POA: Diagnosis not present

## 2021-09-26 DIAGNOSIS — G4730 Sleep apnea, unspecified: Secondary | ICD-10-CM | POA: Insufficient documentation

## 2021-09-26 DIAGNOSIS — N183 Chronic kidney disease, stage 3 unspecified: Secondary | ICD-10-CM | POA: Diagnosis not present

## 2021-09-26 DIAGNOSIS — I130 Hypertensive heart and chronic kidney disease with heart failure and stage 1 through stage 4 chronic kidney disease, or unspecified chronic kidney disease: Secondary | ICD-10-CM | POA: Diagnosis not present

## 2021-09-26 DIAGNOSIS — Z86010 Personal history of colonic polyps: Secondary | ICD-10-CM | POA: Diagnosis not present

## 2021-09-26 DIAGNOSIS — K219 Gastro-esophageal reflux disease without esophagitis: Secondary | ICD-10-CM | POA: Diagnosis not present

## 2021-09-26 HISTORY — PX: COLONOSCOPY WITH PROPOFOL: SHX5780

## 2021-09-26 SURGERY — COLONOSCOPY WITH PROPOFOL
Anesthesia: Monitor Anesthesia Care

## 2021-09-26 MED ORDER — LACTATED RINGERS IV SOLN: INTRAVENOUS | Status: DC | PRN

## 2021-09-26 MED ORDER — PROPOFOL 500 MG/50ML IV EMUL
INTRAVENOUS | Status: AC
  Filled 2021-09-26: qty 50

## 2021-09-26 MED ORDER — PROPOFOL 500 MG/50ML IV EMUL
INTRAVENOUS | Status: DC | PRN
  Administered 2021-09-26: 125 ug/kg/min via INTRAVENOUS

## 2021-09-26 MED ORDER — PHENYLEPHRINE 80 MCG/ML (10ML) SYRINGE FOR IV PUSH (FOR BLOOD PRESSURE SUPPORT)
PREFILLED_SYRINGE | INTRAVENOUS | Status: DC | PRN
  Administered 2021-09-26: 80 ug via INTRAVENOUS

## 2021-09-26 MED ORDER — PROPOFOL 10 MG/ML IV BOLUS
INTRAVENOUS | Status: DC | PRN
  Administered 2021-09-26: 10 mg via INTRAVENOUS

## 2021-09-26 MED ORDER — SODIUM CHLORIDE 0.9 % IV SOLN: INTRAVENOUS | Status: DC

## 2021-09-26 SURGICAL SUPPLY — 22 items

## 2021-09-26 NOTE — Anesthesia Preprocedure Evaluation (Addendum)
Anesthesia Evaluation  ?Patient identified by MRN, date of birth, ID band ?Patient awake ? ? ? ?Reviewed: ?Allergy & Precautions, NPO status , Patient's Chart, lab work & pertinent test results, reviewed documented beta blocker date and time  ? ?History of Anesthesia Complications ?Negative for: history of anesthetic complications ? ?Airway ?Mallampati: I ? ?TM Distance: >3 FB ?Neck ROM: Full ? ? ? Dental ? ?(+) Missing, Dental Advisory Given, Poor Dentition ?  ?Pulmonary ?sleep apnea, Continuous Positive Airway Pressure Ventilation and Oxygen sleep apnea ,  ?  ?breath sounds clear to auscultation ? ? ? ? ? ? Cardiovascular ?hypertension, Pt. on medications and Pt. on home beta blockers ?(-) angina+CHF  ?+ Valvular Problems/Murmurs (severe) MR  ?Rhythm:Regular Rate:Normal ?+ Systolic murmurs ?'21 ECHO: ?1. Moderate global reduction in LV systolic function; moderate LAE; mild prolapse of anterior MV leaflet with mildly restricted posterior leaflet; severe MR.  ??2. Left ventricular ejection fraction, by estimation, is 35 to 40%. The LV has moderately decreased function, global hypokinesis.  ? ?Cardiology [facility]): not thought to be a surgical candidate and would be even high risk potentially for MitraClip ?  ?Neuro/Psych ?PSYCHIATRIC DISORDERS (borderline, delayed ) negative neurological ROS ?   ? GI/Hepatic ?Neg liver ROS, GERD  Medicated and Controlled,  ?Endo/Other  ?Morbid obesityObese: Ozempic ? Renal/GU ?Renal InsufficiencyRenal disease  ? ?  ?Musculoskeletal ? ? Abdominal ?(+) + obese,   ?Peds ? Hematology ?negative hematology ROS ?(+)   ?Anesthesia Other Findings ? ? Reproductive/Obstetrics ? ?  ? ? ? ? ? ? ? ? ? ? ? ? ? ?  ?  ? ? ? ? ? ? ?Anesthesia Physical ?Anesthesia Plan ? ?ASA: 4 ? ?Anesthesia Plan: MAC  ? ?Post-op Pain Management: Minimal or no pain anticipated  ? ?Induction:  ? ?PONV Risk Score and Plan: 2 and Ondansetron and Treatment may vary due to age or medical  condition ? ?Airway Management Planned: Natural Airway and Simple Face Mask ? ?Additional Equipment: None ? ?Intra-op Plan:  ? ?Post-operative Plan:  ? ?Informed Consent: I have reviewed the patients History and Physical, chart, labs and discussed the procedure including the risks, benefits and alternatives for the proposed anesthesia with the patient or authorized representative who has indicated his/her understanding and acceptance.  ? ? ? ?Dental advisory given and Consent reviewed with POA ? ?Plan Discussed with: CRNA and Surgeon ? ?Anesthesia Plan Comments: (Discussed with pt and pt's sister)  ? ? ? ? ? ?Anesthesia Quick Evaluation ? ?

## 2021-09-26 NOTE — H&P (Signed)
[facility] History and Physical ? ? ?Primary Care Physician:  [name], MD ? ? ?Reason for Procedure:   Hx colon polyps ? ?Plan:    colonoscopy ? ? ? ? ?HPI: Lauren Wood is a 61 y.o. female w/ prior colon polyps as below. ? ?06/24/2013 2 cm hepatic flexure polyp and 8 mm descending polyp - both tubular adenomas - will need close f/u colonoscopy to ensure removal of larger polyp  ? ?09/2015: 1 tubular adenoma. Next colonoscopy in 5 years.  ?Past Medical History:  ?Diagnosis Date  ? ALLERGIC RHINITIS 08/02/2009  ? Arthritis   ? Boil of vulva 11/07/2012  ? Borderline personality disorder ([place]) 05/31/2011  ? CKD (chronic kidney disease) stage 3, GFR 30-59 ml/min (HCC)   ? chronic  ? Gout of foot 09/26/2013  ? L foot 09/2013. In the setting of HCTZ 25 mg and lasix 40 mg daily. Stopped HCTZ after decreasing to 12.5 mg. Treated with colchicine.    ? Heart failure ([place])   ? chronic  ? Heart murmur   ? Hip pain 12/03/2012  ? HTN (hypertension)   ? 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 Obesity   ? Personal history of colonic polyps 06/24/2013  ? 06/24/2013 2 cm hepatic flexure polyp and 8 mm descending polyp 06/25/2013 path report of polys --> tubular adenoma w/o high grade dysplasia     ? PRESSURE ULCER OTHER SITE 10/01/2009  ? hx of  ? Sleep apnea   ? uses cpap, setting of 2  ? Unspecified psychosis 12/20/2009  ? ? ?Past Surgical History:  ?Procedure Laterality Date  ? COLONOSCOPY N/A 06/24/2013  ? Procedure: COLONOSCOPY;  Surgeon: Gatha Mayer, MD;  Location: [facility] ENDOSCOPY;  Service: Endoscopy;  Laterality: N/A;  ? COLONOSCOPY WITH PROPOFOL N/A 10/05/2015  ? Procedure: COLONOSCOPY WITH PROPOFOL;  Surgeon: Gatha Mayer, MD;  Location: [facility] ENDOSCOPY;  Service: Endoscopy;  Laterality: N/A;  ? cyst removed  2011  ? forehead  ? TEE WITHOUT CARDIOVERSION N/A 03/19/2020  ? Procedure: TRANSESOPHAGEAL ECHOCARDIOGRAM (TEE);  Surgeon: Lelon Perla, MD;  Location[facility] ENDOSCOPY;  Service: Cardiovascular;  Laterality:  N/A;  ? TYMPANOSTOMY TUBE PLACEMENT Bilateral yrs ago  ? ? ?Prior to Admission medications   ?Medication Sig Start Date End Date Taking? Authorizing Provider  ?benzonatate (TESSALON PERLES) 100 MG capsule Take 2 capsules (200 mg total) by mouth 3 (three) times daily as needed for cough. 05/10/21  Yes [name], DO  ?benztropine (COGENTIN) 0.5 MG tablet Take 0.5 mg by mouth 2 (two) times daily.   Yes [provider]  ?buPROPion (WELLBUTRIN XL) 150 MG 24 hr tablet Take 150 mg by mouth every morning.   Yes [provider]  ?clonazePAM (KLONOPIN) 0.5 MG tablet Take 0.5 tablets (0.25 mg total) by mouth daily as needed for anxiety. ?Patient taking differently: Take 0.25 mg by mouth 2 (two) times daily. 07/13/20  Yes [name], MD  ?docusate sodium (COLACE) 100 MG capsule TAKE ONE CAPSULE TWICE A DAY AS NEEDED FOR MILD OR MODERATE CONSTIPATION ?Patient taking differently: 100 mg 2 (two) times daily. 08/23/21  Yes [name], MD  ?FLUoxetine (PROZAC) 40 MG capsule Take 1 capsule (40 mg total) by mouth every morning. 07/13/20  Yes [name], MD  ?furosemide (LASIX) 80 MG tablet TAKE ONE TABLET BY MOUTH ONCE DAILY 09/16/21  Yes [name], MD  ?GOODSENSE ASPIRIN LOW DOSE 81 MG EC tablet TAKE ONE TABLET BY MOUTH ONCE DAILY  SWALLOW WHOLE 08/03/21  Yes [name], MD  ?losartan (COZAAR) 50 MG tablet Take 1 tablet (50 mg total) by mouth daily. 05/19/21  Yes Minus Breeding, MD  ?metoprolol tartrate (LOPRESSOR) 100 MG tablet TAKE ONE TABLET TWICE DAILY 07/11/21  Yes [name], MD  ?mirtazapine (REMERON) 30 MG tablet Take 1 tablet (30 mg total) by mouth at bedtime. 07/13/20  Yes [name], MD  ?omeprazole (PRILOSEC) 20 MG capsule TAKE ONE CAPSULE EACH DAY Strength: 20 mg 05/04/21  Yes Brimage, Ronnette Juniper, DO  ?OZEMPIC, 0.25 OR 0.5 MG/DOSE, 2 MG/1.5ML SOPN INJECT 0.'5MG'$  INTO SKIN ONCE A WEEK FOR AT LEAST 4 WEEKS 08/04/21  Yes [name], MD  ?pregabalin (LYRICA) 50 MG capsule Take 1 capsule (50 mg  total) by mouth daily. 07/13/20  Yes [name], MD  ?risperiDONE (RISPERDAL) 3 MG tablet Take 3 mg by mouth 3 (three) times daily. 11/25/20  Yes [provider]  ?spironolactone (ALDACTONE) 25 MG tablet TAKE ONE TABLET BY MOUTH ONCE DAILY 08/23/21  Yes [name], MD  ?traZODone (DESYREL) 100 MG tablet Take 100 mg by mouth at bedtime.   Yes [provider]  ?acetaminophen (TYLENOL) 500 MG tablet Take 500 mg by mouth every 6 (six) hours as needed for headache.     [provider]  ?allopurinol (ZYLOPRIM) 100 MG tablet TAKE ONE TABLET BY MOUTH ONCE DAILY 07/29/21   [name], MD  ?colchicine 0.6 MG tablet TAKE ONE-HALF TABLET EVERY OTHER DAY FORGOUT FOR 3 MONTHS ?Patient taking differently: Take 0.3 mg by mouth daily as needed (Gout). 08/05/20   [name], MD  ?fluticasone (FLONASE) 50 MCG/ACT nasal spray Place 2 sprays into both nostrils daily. ?Patient not taking: Reported on 09/23/2021 05/10/21   [name], DO  ? ? ?No current facility-administered medications for this encounter.  ? ? ?Allergies as of 07/14/2021 - Review Complete 07/08/2021  ?Allergen Reaction Noted  ? Enalapril maleate Other (See Comments) 08/02/2009  ? Hctz [hydrochlorothiazide] Other (See Comments) 11/04/2013  ? Lasix [furosemide] Other (See Comments) 11/04/2013  ? ? ?Family History  ?Problem Relation Age of Onset  ? Colon cancer Father   ?     around age 93  ? Diabetes Mother   ? Esophageal cancer Neg Hx   ? Pancreatic cancer Neg Hx   ? Stomach cancer Neg Hx   ? Liver disease Neg Hx   ? ? ?Social History  ? ?Socioeconomic History  ? Marital status: Single  ?  Spouse name: Not on file  ? Number of children: 0  ? Years of education: Not on file  ? Highest education level: Not on file  ?Occupational History  ? Occupation: Disabled  ?Tobacco Use  ? Smoking status: Never  ? Smokeless tobacco: Never  ?Vaping Use  ? Vaping Use: Never used  ?Substance and Sexual Activity  ? Alcohol use: No  ?  Alcohol/week:  0.0 standard drinks  ? Drug use: No  ? Sexual activity: Never  ?Other Topics Concern  ? Not on file  ?Social History Narrative  ? Goes to adult daycare.  Lives with sister.    ?   ?   ? ?Social Determinants of Health  ? ?Financial Resource Strain: Not on file  ?Food Insecurity: Not on file  ?Transportation Needs: Not on file  ?Physical Activity: Not on file  ?Stress: Not on file  ?Social Connections: Not on file  ?Intimate Partner Violence: Not on file  ? ? ?Review of Systems: ? ?All other  review of systems negative except as mentioned in the HPI. ? ?Physical Exam: ?Vital signs ?LMP 05/08/2013  ? ?General:   Alert,  Well-developed, well-nourished, pleasant and cooperative in NAD - obese bw ?Lungs:  Clear throughout to auscultation.   ?Heart:  Regular rate and rhythm; no murmurs, clicks, rubs,  or gallops. ?Abdomen:  obese Soft, nontender and nondistended. Normal bowel sounds.   ?Neuro/Psych:  Alert and cooperative. Normal mood and affect.  ? ? ?'@Ivaan Liddy'$  Simonne Maffucci, MD, [facility] ?[facility] ?[phone] (pager) ?09/26/2021 9:27 AM@ ? ? ?

## 2021-09-26 NOTE — Anesthesia Postprocedure Evaluation (Signed)
Anesthesia Post Note ? ?Patient: Lauren Wood ? ?Procedure(s) Performed: COLONOSCOPY WITH PROPOFOL ? ?  ? ?Patient location during evaluation: Endoscopy ?Anesthesia Type: MAC ?Level of consciousness: awake and alert, patient cooperative and oriented ?Pain management: pain level controlled ?Vital Signs Assessment: post-procedure vital signs reviewed and stable ?Respiratory status: spontaneous breathing, nonlabored ventilation and respiratory function stable ?Cardiovascular status: blood pressure returned to baseline and stable ?Postop Assessment: no apparent nausea or vomiting ?Anesthetic complications: no ? ? ?No notable events documented. ? ?Last Vitals:  ?Vitals:  ? 09/26/21 1033 09/26/21 1040  ?BP: (!) 123/59 133/66  ?Pulse: 71 73  ?Resp: (!) 26 (P) 20  ?Temp: (!) 36.3 ?C   ?SpO2: 100% 98%  ?  ?Last Pain:  ?Vitals:  ? 09/26/21 1040  ?TempSrc:   ?PainSc: (P) 0-No pain  ? ? ?  ?  ?  ?  ?  ?  ? ?Aeron Lheureux,E. Thadeus Gandolfi ? ? ? ? ?

## 2021-09-26 NOTE — Discharge Instructions (Addendum)
No polyps or cancer seen today. ? ?Consider repeat colonoscopy in 5 years. ? ?I appreciate the opportunity to care for you. ?Gatha Mayer, MD, [facility] ? ? ?YOU HAD AN ENDOSCOPIC PROCEDURE TODAY: Refer to the procedure report and other information in the discharge instructions given to you for any specific questions about what was found during the examination. If this information does not answer your questions, please call [facility] office at [phone] to clarify.  ? ?YOU SHOULD EXPECT: Some feelings of bloating in the abdomen. Passage of more gas than usual. Walking can help get rid of the air that was put into your GI tract during the procedure and reduce the bloating. If you had a lower endoscopy (such as a colonoscopy or flexible sigmoidoscopy) you may notice spotting of blood in your stool or on the toilet paper. Some abdominal soreness may be present for a day or two, also. ? ?DIET: Your first meal following the procedure should be a light meal and then it is ok to progress to your normal diet. A half-sandwich or bowl of soup is an example of a good first meal. Heavy or fried foods are harder to digest and may make you feel nauseous or bloated. Drink plenty of fluids but you should avoid alcoholic beverages for 24 hours. If you had a esophageal dilation, please see attached instructions for diet.   ? ?ACTIVITY: Your care partner should take you home directly after the procedure. You should plan to take it easy, moving slowly for the rest of the day. You can resume normal activity the day after the procedure however YOU SHOULD NOT DRIVE, use power tools, machinery or perform tasks that involve climbing or major physical exertion for 24 hours (because of the sedation medicines used during the test).  ? ?SYMPTOMS TO REPORT IMMEDIATELY: ?A gastroenterologist can be reached at any hour. Please call [phone]  for any of the following symptoms:  ?Following lower endoscopy (colonoscopy, flexible  sigmoidoscopy) ?Excessive amounts of blood in the stool  ?Significant tenderness, worsening of abdominal pains  ?Swelling of the abdomen that is new, acute  ?Fever of 100? or higher  ?Following upper endoscopy (EGD, EUS, ERCP, esophageal dilation) ?Vomiting of blood or coffee ground material  ?New, significant abdominal pain  ?New, significant chest pain or pain under the shoulder blades  ?Painful or persistently difficult swallowing  ?New shortness of breath  ?Black, tarry-looking or red, bloody stools ? ?FOLLOW UP:  ?If any biopsies were taken you will be contacted by phone or by letter within the next 1-3 weeks. Call [phone]  if you have not heard about the biopsies in 3 weeks.  ?Please also call with any specific questions about appointments or follow up tests.  ?

## 2021-09-26 NOTE — Transfer of Care (Signed)
Immediate Anesthesia Transfer of Care Note ? ?Patient: Lauren Wood ? ?Procedure(s) Performed: COLONOSCOPY WITH PROPOFOL ? ?Patient Location: PACU ? ?Anesthesia Type:MAC ? ?Level of Consciousness: drowsy ? ?Airway & Oxygen Therapy: Patient Spontanous Breathing and Patient connected to face mask oxygen ? ?Post-op Assessment: Report given to RN, Post -op Vital signs reviewed and stable and Patient moving all extremities X 4 ? ?Post vital signs: Reviewed and stable ? ?Last Vitals:  ?Vitals Value Taken Time  ?BP 123/59   ?Temp    ?Pulse 71 09/26/21 1033  ?Resp    ?SpO2 100 % 09/26/21 1033  ?Vitals shown include unvalidated device data. ? ?Last Pain:  ?Vitals:  ? 09/26/21 0933  ?TempSrc: Temporal  ?PainSc: 0-No pain  ?   ? ?  ? ?Complications: No notable events documented. ?

## 2021-09-26 NOTE — Op Note (Signed)
[facility] ?Patient Name: Lauren Wood ?Procedure Date: 09/26/2021 ?MRN: [id number] ?Attending MD: Gatha Mayer , MD ?Date of Birth: [birth year] ?CSN: [id number] ?Age: 61 ?Admit Type: Outpatient ?Procedure:                Colonoscopy ?Indications:              Surveillance: Personal history of adenomatous  ?                          polyps on last colonoscopy > 5 years ago, Last  ?                          colonoscopy: 2017 ?Providers:                Gatha Mayer, MD, Carlyn Reichert, RN, Alphonzo Grieve  ?                          Leighton Roach, Technician ?Referring MD:              ?Medicines:                Monitored Anesthesia Care ?Complications:            No immediate complications. ?Estimated Blood Loss:     Estimated blood loss: none. ?Procedure:                Pre-Anesthesia Assessment: ?                          - Prior to the procedure, a History and Physical  ?                          was performed, and patient medications and  ?                          allergies were reviewed. The patient's tolerance of  ?                          previous anesthesia was also reviewed. The risks  ?                          and benefits of the procedure and the sedation  ?                          options and risks were discussed with the patient.  ?                          All questions were answered, and informed consent  ?                          was obtained. Prior Anticoagulants: The patient has  ?                          taken no previous anticoagulant or antiplatelet  ?                          agents. ASA Grade  Assessment: IV - A patient with  ?                          severe systemic disease that is a constant threat  ?                          to life. After reviewing the risks and benefits,  ?                          the patient was deemed in satisfactory condition to  ?                          undergo the procedure. ?                          After obtaining informed consent, the colonoscope  ?                           was passed under direct vision. Throughout the  ?                          procedure, the patient's blood pressure, pulse, and  ?                          oxygen saturations were monitored continuously. The  ?                          CF-HQ190L ([id number]) Olympus colonoscope was  ?                          introduced through the anus and advanced to the the  ?                          cecum, identified by appendiceal orifice and  ?                          ileocecal valve. The colonoscopy was somewhat  ?                          difficult due to extensive lavage requirement. The  ?                          patient tolerated the procedure well. The quality  ?                          of the bowel preparation was adequate. The bowel  ?                          preparation used was Miralax via split dose  ?                          instruction. The ileocecal valve, appendiceal  ?  orifice, and rectum were photographed. ?Scope In: 9:54:25 AM ?Scope Out: 10:30:25 AM ?Scope Withdrawal Time: 0 hours 28 minutes 28 seconds  ?Total Procedure Duration: 0 hours 36 minutes 0 seconds  ?Findings: ?     The perianal and digital rectal examinations were normal. ?     Multiple diverticula were found in the sigmoid colon. ?     The exam was otherwise without abnormality on direct and retroflexion  ?     views. ?Impression:               - Diverticulosis in the sigmoid colon. ?                          - The examination was otherwise normal on direct  ?                          and retroflexion views. ?                          - No specimens collected. Adequate oprep achieved  ?                          w/ extensive lavage ?                          - Personal history of colonic polyps. 06/24/2013 2  ?                          cm hepatic flexure polyp and 8 mm descending polyp  ?                          - both tubular adenomas ?                          09/2015: 1 tubular adenoma ?Moderate  Sedation: ?     Not Applicable - Patient had care per Anesthesia. ?Recommendation:           - Patient has a contact number available for  ?                          emergencies. The signs and symptoms of potential  ?                          delayed complications were discussed with the  ?                          patient. Return to normal activities tomorrow.  ?                          Written discharge instructions were provided to the  ?                          patient. ?                          - Resume previous diet. ?                          -  Continue present medications. ?                          - Repeat colonoscopy in 5 years for surveillance -  ?                          depending upon health status. Would consider  ?                          different vs extra prep ?Procedure Code(s):        --- Professional --- ?                          G0105, Colorectal cancer screening; colonoscopy on  ?                          individual at high risk ?Diagnosis Code(s):        --- Professional --- ?                          Z86.010, Personal history of colonic polyps ?                          K57.30, Diverticulosis of large intestine without  ?                          perforation or abscess without bleeding ?CPT copyright 2019 American Medical Association. All rights reserved. ?The codes documented in this report are preliminary and upon coder review may  ?be revised to meet current compliance requirements. ?Gatha Mayer, MD ?09/26/2021 10:44:44 AM ?This report has been signed electronically. ?Number of Addenda: 0 ?

## 2021-09-26 NOTE — Anesthesia Procedure Notes (Signed)
Procedure Name: [place] ?Date/Time: 09/26/2021 9:48 AM ?Performed by: Niel Hummer, CRNA ?Pre-anesthesia Checklist: Patient identified, Emergency Drugs available, Suction available and Patient being monitored ?Oxygen Delivery Method: Simple face mask ? ? ? ? ?

## 2021-09-27 ENCOUNTER — Encounter (HOSPITAL_COMMUNITY): Payer: Self-pay | Admitting: Internal Medicine

## 2021-09-30 ENCOUNTER — Other Ambulatory Visit: Payer: Self-pay | Admitting: *Deleted

## 2021-09-30 MED ORDER — OZEMPIC (0.25 OR 0.5 MG/DOSE) 2 MG/1.5ML ~~LOC~~ SOPN: PEN_INJECTOR | SUBCUTANEOUS | 0 refills | Status: DC

## 2021-10-06 ENCOUNTER — Ambulatory Visit: Payer: Medicare Other | Admitting: Physician Assistant

## 2021-10-12 ENCOUNTER — Ambulatory Visit (INDEPENDENT_AMBULATORY_CARE_PROVIDER_SITE_OTHER): Payer: Medicare Other | Admitting: Family Medicine

## 2021-10-12 ENCOUNTER — Encounter: Payer: Self-pay | Admitting: Family Medicine

## 2021-10-12 VITALS — BP 109/71 | HR 96 | Ht 63.0 in | Wt 313.2 lb

## 2021-10-12 DIAGNOSIS — G4733 Obstructive sleep apnea (adult) (pediatric): Secondary | ICD-10-CM | POA: Diagnosis not present

## 2021-10-12 MED ORDER — SEMAGLUTIDE (1 MG/DOSE) 4 MG/3ML ~~LOC~~ SOPN: 1.0000 mg | PEN_INJECTOR | SUBCUTANEOUS | 0 refills | Status: AC

## 2021-10-12 NOTE — Patient Instructions (Signed)
Thank you for coming to see me today. It was a pleasure. Today we discussed your weight-decrease fast food, increase veggies, fruit, baked foods. I recommend increase ozempic to '4mg'$  weekly  Sent cpap machine   Please follow-up with me as needed   If you have any questions or concerns, please do not hesitate to call the office at [phone].  Best wishes,   Dr Posey Pronto

## 2021-10-12 NOTE — Progress Notes (Signed)
     SUBJECTIVE:   CHIEF COMPLAINT / HPI:   Lauren Wood is a 61 y.o. female presents for follow up  Sister-legal guarding present at visit today  Weight gain Pt's sister reports weight gain. Bentley lives with her sister but has an aid in the evenings when her sister is at work. Sister has not been able to prepare Jullianna's meals as much, and so and aid will buy fast food often. This had lead her to gain weight since the previous visit. Denies worsening of peripheral edema, chest pain, dyspnea, cough etc.   OSA Requesting new CPAP machine.   [place] Office Visit from 08/26/2021 in [place]  PHQ-9 Total Score 3        PERTINENT  PMH / PSH: CHF, OSA, CHF  OBJECTIVE:   BP 109/71   Pulse 96   Ht '5\' 3"'$  (1.6 m)   Wt (!) 313 lb 3.2 oz (142.1 kg)   LMP 05/08/2013   SpO2 97%   BMI 55.48 kg/m    General: Alert, no acute distress, well appearing  Cardio: Normal S1 and S2, RRR, no r/m/g Pulm: CTAB, normal work of breathing Extremities: No peripheral edema.  Neuro: Cranial nerves grossly intact   ASSESSMENT/PLAN:   OSA (obstructive sleep apnea) Prescribed new CPAP machine.   Morbid obesity ([place]) Pt with approx 18lb weight gain since previous visit. Unclear whether this is accurate. She does not appear fluid overloaded which is reassuring and I do not think this is fluid weight. Diet and exercise counseling provided. Increased ozempic dose to '1mg'$  weekly. F/u in 1 month. Strict ER precautions provided.     [name], MD PGY-3 [place]

## 2021-10-16 NOTE — Assessment & Plan Note (Signed)
Prescribed new CPAP machine.

## 2021-10-16 NOTE — Assessment & Plan Note (Signed)
Pt with approx 18lb weight gain since previous visit. Unclear whether this is accurate. She does not appear fluid overloaded which is reassuring and I do not think this is fluid weight. Diet and exercise counseling provided. Increased ozempic dose to '1mg'$  weekly. F/u in 1 month. Strict ER precautions provided.

## 2021-10-18 ENCOUNTER — Encounter: Payer: Self-pay | Admitting: *Deleted

## 2021-10-21 ENCOUNTER — Telehealth: Payer: Self-pay | Admitting: *Deleted

## 2021-10-21 NOTE — Telephone Encounter (Signed)
Received fax requesting 90 day supply Rx to be sent in for pts spironolactone and allopurinol. Please send Rx if appropriate.Zoye Chandra Zimmerman Rumple, CMA

## 2021-10-24 ENCOUNTER — Other Ambulatory Visit: Payer: Self-pay | Admitting: Family Medicine

## 2021-10-24 MED ORDER — ALLOPURINOL 100 MG PO TABS: 100.0000 mg | ORAL_TABLET | Freq: Every day | ORAL | 0 refills | Status: DC

## 2021-10-24 MED ORDER — SPIRONOLACTONE 25 MG PO TABS: 25.0000 mg | ORAL_TABLET | Freq: Every day | ORAL | 0 refills | Status: DC

## 2021-10-24 NOTE — Telephone Encounter (Signed)
Sent in medications to patient's pharmacy. Thanks

## 2021-10-24 NOTE — Progress Notes (Deleted)
Cardiology Clinic Note   Patient Name: Lauren Wood Date of Encounter: 10/24/2021  Primary Care Provider:  [name], MD Primary Cardiologist:  Lauren Breeding, MD  Patient Profile    Lauren Wood 61 year old female presents the clinic today for follow-up of her hypertension and CHF.  Past Medical History    Past Medical History:  Diagnosis Date   ALLERGIC RHINITIS 08/02/2009   Arthritis    Boil of vulva 11/07/2012   Borderline personality disorder ([place]) 05/31/2011   CKD (chronic kidney disease) stage 3, GFR 30-59 ml/min (HCC)    chronic   Gout of foot 09/26/2013   L foot 09/2013. In the setting of HCTZ 25 mg and lasix 40 mg daily. Stopped HCTZ after decreasing to 12.5 mg. Treated with colchicine.     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      PRESSURE ULCER OTHER SITE 10/01/2009   hx of   Sleep apnea    uses cpap, setting of 2   Unspecified psychosis 12/20/2009   Past Surgical History:  Procedure Laterality Date   COLONOSCOPY N/A 06/24/2013   Procedure: COLONOSCOPY;  Surgeon: Lauren Mayer, MD;  Location: [facility] ENDOSCOPY;  Service: Endoscopy;  Laterality: N/A;   COLONOSCOPY WITH PROPOFOL N/A 10/05/2015   Procedure: COLONOSCOPY WITH PROPOFOL;  Surgeon: Lauren Mayer, MD;  Location: [facility] ENDOSCOPY;  Service: Endoscopy;  Laterality: N/A;   COLONOSCOPY WITH PROPOFOL N/A 09/26/2021   Procedure: COLONOSCOPY WITH PROPOFOL;  Surgeon: Lauren Mayer, MD;  Location: [facility] ENDOSCOPY;  Service: Gastroenterology;  Laterality: N/A;   cyst removed  2011   forehead   TEE WITHOUT CARDIOVERSION N/A 03/19/2020   Procedure: TRANSESOPHAGEAL ECHOCARDIOGRAM (TEE);  Surgeon: Lauren Perla, MD;   Location: [facility] ENDOSCOPY;  Service: Cardiovascular;  Laterality: N/A;   TYMPANOSTOMY TUBE PLACEMENT Bilateral yrs ago    Allergies  Allergies  Allergen Reactions   Enalapril Maleate Other (See Comments)    Unknown reaction - per family   Vasotec    Hctz [Hydrochlorothiazide] Other (See Comments)    Hyperuricemia with Lasix    Lasix [Furosemide] Other (See Comments)    Hyperuricemia with HCTZ      History of Present Illness    Lauren Wood has a PMH of essential hypertension, chronic systolic and diastolic CHF, hypertension, borderline personality disorder, intellectual disability, nonrheumatic mitral valve regurgitation, OSA, acute respiratory failure, GERD, CKD stage III, morbid obesity, and RLS.  She was admitted to the hospital 03/16/2020 and discharged on 03/25/2020 she presented to the ER with dyspnea.  She was found to be tachypneic and hypoxic at 77%.  She had received her COVID booster 2 weeks prior.  Her oxygenation improved with 2 L nasal cannula.  A CXR was unremarkable for vascular congestion.  Her BNP was mildly elevated at 232, creatinine 1.36 at baseline, and she was hypertensive with systolics in the 474-259 range.  Her EKG showed no changes with a known left bundle branch block.  During her evaluation she reported that she felt stiff due to limited activity restrictions.  She denied increased work of breathing or weight gain.  She reported  that her shortness of breath had resolved and she had no further complaints.  She presented to the clinic 06/25/20 for follow-up evaluation stated she felt well.  She had left skilled nursing and was back home with her sister.  Her sister reported that she had not been very active.  She would get up, ambulate to the bathroom, ambulate back to her chair, and sit down.  She reported that she did not do any further physical activity.  She reported that she did have increased work of breathing with increased physical activity.  In the office  her  oxygen saturation was 98% on 1 L.  We reviewed weaning her oxygen over a period of time as long as her oxygen saturation stayed in the mid to high 90s.  Her sister expressed understanding.  We discussed the MitraClip procedure/surgery.  At the time she was not very physically active and morbidly obese at 346 pounds.  I asked her to continue to evaluate  over the next 9 to 12 months and if she was able to lose weight and showed interest in becoming more physically active I would refer her to structural heart team for further evaluation.  Her sister agreed with recommendations.  I gave her the salty 6 diet sheet, had her increase her physical activity as tolerated, slowly wean oxygen at home over the course of the next few weeks, and planned follow-up with Lauren Wood in 3 months.  She was seen in follow-up by Lauren Wood on 05/19/2021.  She continues on home O2.  She was using a walker to ambulate.  She reported that she would get dyspneic sporadically at night and would wear oxygen as needed.  She was limited in her physical activity due to left knee pain.  She was sleeping chronically with 4 pillows.  She denied new chest pressure, arm and neck discomfort.  She denied any palpitations presyncope and syncope.  She was not noted to have new lower extremity edema.  She presents to the clinic today for follow-up evaluation states***.  Today she denies chest pain, shortness of breath, lower extremity edema, fatigue, palpitations, melena, hematuria, hemoptysis, diaphoresis, weakness, presyncope, syncope, orthopnea, and PND.   Home Medications    Prior to Admission medications   Medication Sig Start Date End Date Taking? Authorizing Provider  acetaminophen (TYLENOL) 500 MG tablet Take 500 mg by mouth every 6 (six) hours as needed for headache.     [provider]  allopurinol (ZYLOPRIM) 100 MG tablet Take 1 tablet (100 mg total) by mouth daily. 02/11/20   Lauren Haw, MD  aspirin (QC LO-DOSE  ASPIRIN) 81 MG EC tablet Take 1 tablet (81 mg total) by mouth daily. Swallow whole. 11/23/16   Smiley Houseman, MD  benztropine (COGENTIN) 0.5 MG tablet Take 0.25 mg by mouth 2 (two) times daily.    [provider]  buPROPion (WELLBUTRIN XL) 150 MG 24 hr tablet Take 150 mg by mouth every morning.    [provider]  clonazePAM (KLONOPIN) 0.5 MG tablet Take 0.5 tablets (0.25 mg total) by mouth daily as needed for anxiety. 03/25/20   Alcus Dad, MD  colchicine 0.6 MG tablet Take 0.5 tablet (0.'3mg'$ ) by mouth every other day for 3 months 03/25/20   Alcus Dad, MD  DOK 100 MG capsule TAKE ONE CAPSULE TWICE DAILY AS NEEDED FOR MILD OR MODERATE CONSTIPATION Patient taking differently: Take 200 mg by mouth daily.  02/26/17   Smiley Houseman, MD  FLUoxetine (PROZAC)  40 MG capsule Take 40 mg by mouth every morning.    [provider]  furosemide (LASIX) 80 MG tablet TAKE ONE TABLET DAILY Patient taking differently: Take 80 mg by mouth daily.  02/11/20   Lauren Haw, MD  losartan (COZAAR) 25 MG tablet Take 1 tablet (25 mg total) by mouth daily. 03/25/20   Alcus Dad, MD  metoprolol tartrate (LOPRESSOR) 100 MG tablet Take 1 tablet (100 mg total) by mouth 2 (two) times daily. 03/25/20   Alcus Dad, MD  mirtazapine (REMERON) 30 MG tablet Take 30 mg by mouth at bedtime.  04/11/13   [provider]  omeprazole (PRILOSEC) 20 MG capsule TAKE ONE CAPSULE EACH DAY Patient taking differently: Take 20 mg by mouth daily.  11/24/19   Lauren Haw, MD  prazosin (MINIPRESS) 1 MG capsule Take 1 mg by mouth at bedtime.  11/03/14   [provider]  pregabalin (LYRICA) 50 MG capsule TAKE ONE CAPSULE EACH DAY Patient taking differently: Take 50 mg by mouth daily.  11/03/19   [name], MD  risperidone (RISPERDAL) 4 MG tablet Take 4 mg by mouth 2 (two) times daily.  04/26/13   [provider]  spironolactone (ALDACTONE) 25 MG tablet Take 1 tablet  (25 mg total) by mouth daily. 10/31/19   [name], MD  traZODone (DESYREL) 100 MG tablet Take 100 mg by mouth at bedtime.  07/14/12   [provider]    Family History    Family History  Problem Relation Age of Onset   Colon cancer Father        around age 63   Diabetes Mother    Esophageal cancer Neg Hx    Pancreatic cancer Neg Hx    Stomach cancer Neg Hx    Liver disease Neg Hx    She indicated that her mother is deceased. She indicated that her father is deceased. She indicated that her sister is alive. She indicated that her brother is alive. She indicated that the status of her neg hx is unknown.  Social History    Social History   Socioeconomic History   Marital status: Single    Spouse name: Not on file   Number of children: 0   Years of education: Not on file   Highest education level: Not on file  Occupational History   Occupation: Disabled  Tobacco Use   Smoking status: Never   Smokeless tobacco: Never  Vaping Use   Vaping Use: Never used  Substance and Sexual Activity   Alcohol use: No    Alcohol/week: 0.0 standard drinks of alcohol   Drug use: No   Sexual activity: Never  Other Topics Concern   Not on file  Social History Narrative   Goes to adult daycare.  Lives with sister.           Social Determinants of Health   Financial Resource Strain: Not on file  Food Insecurity: Not on file  Transportation Needs: Not on file  Physical Activity: Not on file  Stress: Not on file  Social Connections: Not on file  Intimate Partner Violence: Not on file     Review of Systems    General:  No chills, fever, night sweats or weight changes.  Cardiovascular:  No chest pain, dyspnea on exertion, edema, orthopnea, palpitations, paroxysmal nocturnal dyspnea. Dermatological: No rash, lesions/masses Respiratory: No cough, dyspnea Urologic: No hematuria, dysuria Abdominal:   No nausea, vomiting, diarrhea, bright red blood per rectum, melena, or  hematemesis  Neurologic:  No visual changes, wkns, changes in mental status. All other systems reviewed and are otherwise negative except as noted above.  Physical Exam    VS:  LMP 05/08/2013  , BMI There is no height or weight on file to calculate BMI. GEN: Well nourished, well developed, in no acute distress. HEENT: normal. Neck: Supple, no JVD, carotid bruits, or masses. Cardiac: RRR, no murmurs, rubs, or gallops. No clubbing, cyanosis, edema.  Radials/DP/PT 2+ and equal bilaterally.  Respiratory:  Respirations regular and unlabored, clear to auscultation bilaterally. GI: Soft, nontender, nondistended, BS + x 4. MS: no deformity or atrophy. Skin: warm and dry, no rash. Neuro:  Strength and sensation are intact. Psych: Normal affect.  Accessory Clinical Findings    Recent Labs: 11/01/2020: ALT 5; Hemoglobin 11.9; Platelets 274 05/30/2021: BUN 24; Creatinine, Ser 1.61; Potassium 4.8; Sodium 146   Recent Lipid Panel    Component Value Date/Time   CHOL 94 (L) 04/20/2021 1123   TRIG 82 04/20/2021 1123   HDL 28 (L) 04/20/2021 1123   CHOLHDL 3.4 04/20/2021 1123   CHOLHDL 3.2 05/11/2018 0310   VLDL 7 05/11/2018 0310   LDLCALC 49 04/20/2021 1123    ECG personally reviewed by me today-none today.  TEE 03/19/20:    1. Moderate global reduction in LV systolic function; moderate LAE; mild  prolapse of anterior MV leaflet with mildly restricted posterior leaflet;  severe MR.   2. Left ventricular ejection fraction, by estimation, is 35 to 40%. The  left ventricle has moderately decreased function. The left ventricle  demonstrates global hypokinesis.   3. Right ventricular systolic function is normal. The right ventricular  size is normal.   4. Left atrial size was moderately dilated. No left atrial/left atrial  appendage thrombus was detected.   5. The mitral valve is abnormal. Severe mitral valve regurgitation.   6. The aortic valve is tricuspid. Aortic valve regurgitation is not   visualized. No aortic stenosis is present.   7. There is mild (Grade II) plaque involving the descending aorta.  Assessment & Plan   1.  Acute on chronic systolic and diastolic CHF-NYHA class III.  No increased DOE or activity tolerance.  Weight today 346.2 *** Continue furosemide, losartan, metoprolol Heart healthy low-sodium diet-salty 6 given Increase physical activity as tolerated  NSVT-heart rate today ***.  Denies recent episodes of increased heart rate or irregular heartbeats.   Continue metoprolol Heart healthy low-sodium diet-salty 6 given Increase physical activity as tolerated  Essential hypertension-BP today*** 126/82.  Continues to be controlled at home.   Continue losartan, furosemide, metoprolol Heart healthy low-sodium diet-salty 6 given Increase physical activity as tolerated  Mitral regurgitation-no increased DOE or activity intolerance.  Echocardiogram showed severe mitral regurgitation via TEE 03/19/2020.  November admission she was felt to be a candidate for MitraClip.   Shared decision making was used to to review MitraClip procedure.  Continues to be sedentary at home and morbidly obese.  No plans to proceed with further evaluation but not eliminate her as candidate for MitraClip.  Patient and family expressed understanding.  Acute kidney injury-creatinine ***1.24 at baseline, on 03/24/2020. Follows with PCP  Disposition: Follow-up with Lauren Wood in 4-6 months.  Jossie Ng. Trase Bunda NP-C    10/24/2021, 1:00 PM [place] [address] Office [phone] Fax [phone]  Notice: This dictation was prepared with Dragon dictation along with smaller phrase technology. Any transcriptional errors that result from this process are unintentional and  may not be corrected upon review.  I spent 15 minutes examining this patient, reviewing medications, and using patient centered shared decision making involving her cardiac care.   Prior to her visit I spent greater than 20 minutes reviewing her past medical history,  medications, and prior cardiac tests.

## 2021-10-26 ENCOUNTER — Ambulatory Visit: Payer: Medicare Other | Admitting: General Practice

## 2021-11-02 ENCOUNTER — Encounter: Payer: Self-pay | Admitting: General Practice

## 2021-11-07 ENCOUNTER — Other Ambulatory Visit: Payer: Self-pay | Admitting: Family Medicine

## 2021-11-11 ENCOUNTER — Other Ambulatory Visit: Payer: Self-pay | Admitting: Family Medicine

## 2021-11-11 DIAGNOSIS — I10 Essential (primary) hypertension: Secondary | ICD-10-CM

## 2021-11-29 ENCOUNTER — Telehealth: Payer: Self-pay | Admitting: *Deleted

## 2021-11-29 MED ORDER — OMEPRAZOLE 20 MG PO CPDR: DELAYED_RELEASE_CAPSULE | ORAL | 0 refills | Status: DC

## 2021-11-29 NOTE — Telephone Encounter (Signed)
Received a fax from pharmacy requesting a 90 day supply for pts omeprazol Rx. Please resend new Rx if appropriate.Estefany Goebel Zimmerman Rumple, CMA

## 2021-12-09 ENCOUNTER — Ambulatory Visit (INDEPENDENT_AMBULATORY_CARE_PROVIDER_SITE_OTHER): Payer: Medicare Other | Admitting: Family Medicine

## 2021-12-09 ENCOUNTER — Encounter: Payer: Self-pay | Admitting: Family Medicine

## 2021-12-09 DIAGNOSIS — G4733 Obstructive sleep apnea (adult) (pediatric): Secondary | ICD-10-CM

## 2021-12-09 NOTE — Patient Instructions (Signed)
Thank you for coming to see me today. It was a pleasure. Today we discussed increasing physical activity 20 mins x 3 a day.  Please follow-up with [place] as needed   If you have any questions or concerns, please do not hesitate to call the office at [phone].  Best wishes,   Dr Posey Pronto

## 2021-12-09 NOTE — Progress Notes (Incomplete)
     SUBJECTIVE:   CHIEF COMPLAINT / HPI:   Lauren Wood is a 61 y.o. female presents for follow up   Engineering geologist for day centre and sister Lauren Wood present at visit  Day time sleepiness  Falling asleep a lot. Using CPAP machine. Sister is having to change the water in the machine multiple times a night. Company said they did not receive the order.   Obesity Weight 310lb. Lost 3lb since last visit on Ozempic. She is not active as she usually is per Lauren Wood. Her mood fluctuates which affects how much she wants to be active. She usually does one 20 min walk at the day centre but she used to do 3 X 20 mins. Sister and Lauren Wood's aid are cooking her meals at home.   [place] Office Visit from 08/26/2021 in [place]  PHQ-9 Total Score 3      PERTINENT  PMH / PSH: CKD, CHF, HTN  OBJECTIVE:   BP 107/76   Pulse 83   Ht '5\' 3"'$  (1.6 m)   Wt (!) 310 lb (140.6 kg)   LMP 05/08/2013   SpO2 99%   BMI 54.91 kg/m    General: Alert, no acute distress Cardio: well perfused  Pulm: normal work of breathing Neuro: Cranial nerves grossly intact   ASSESSMENT/PLAN:   OSA (obstructive sleep apnea) Sister will contact sleep apnea machine company and ask what order is required for a new machine as they did not receive previous order.   Morbid obesity (Lauren Wood) Doing well on '1mg'$  Ozempic. Total 3lb weight loss since last visit. Congratulated pt. Recommended increasing exercise to 3 x 20 mins a day at daycentre if possible. Continuing healthier food choices provided by sister and aid. Continue Ozempic '2mg'$ . Follow up with Dr Rock Nephew in the next 1-2 months, can increase doze to '2mg'$ .    Lauren Haw, MD PGY-3 [place]

## 2021-12-11 NOTE — Assessment & Plan Note (Addendum)
Doing well on '1mg'$  Ozempic. Total 3lb weight loss since last visit. Congratulated pt. Recommended increasing exercise to 3 x 20 mins a day at daycentre if possible. Continuing healthier food choices provided by sister and aid. Continue Ozempic '2mg'$ . Follow up with Dr Rock Nephew in the next 1-2 months, can increase doze to '2mg'$ .

## 2021-12-11 NOTE — Assessment & Plan Note (Signed)
Sister will contact sleep apnea machine company and ask what order is required for a new machine as they did not receive previous order.

## 2021-12-27 ENCOUNTER — Ambulatory Visit
Admission: RE | Admit: 2021-12-27 | Discharge: 2021-12-27 | Disposition: A | Discharge status: Home / Self Care | Payer: Medicare Other | Source: Ambulatory Visit | Attending: Family Medicine | Admitting: Family Medicine

## 2021-12-27 DIAGNOSIS — Z1231 Encounter for screening mammogram for malignant neoplasm of breast: Secondary | ICD-10-CM

## 2022-01-10 ENCOUNTER — Ambulatory Visit
Admission: RE | Admit: 2022-01-10 | Discharge: 2022-01-10 | Disposition: A | Discharge status: Home / Self Care | Payer: Medicare Other | Source: Ambulatory Visit | Attending: Family Medicine | Admitting: Family Medicine

## 2022-01-10 ENCOUNTER — Other Ambulatory Visit: Payer: Self-pay | Admitting: *Deleted

## 2022-01-10 DIAGNOSIS — Z13820 Encounter for screening for osteoporosis: Secondary | ICD-10-CM

## 2022-01-10 DIAGNOSIS — I10 Essential (primary) hypertension: Secondary | ICD-10-CM

## 2022-01-10 MED ORDER — METOPROLOL TARTRATE 100 MG PO TABS: 100.0000 mg | ORAL_TABLET | Freq: Two times a day (BID) | ORAL | 0 refills | Status: DC

## 2022-01-19 ENCOUNTER — Telehealth: Payer: Self-pay | Admitting: Cardiology

## 2022-01-19 ENCOUNTER — Telehealth: Payer: Self-pay

## 2022-01-19 DIAGNOSIS — G4733 Obstructive sleep apnea (adult) (pediatric): Secondary | ICD-10-CM

## 2022-01-19 NOTE — Telephone Encounter (Signed)
Please see below message from [employer]. Can we place a new Cpap order with titration settings?    Wylene Simmer,   I am not sure how this order was sent, but  I didn't get it throw the cone system. I double checked.   The order will need to be updated as it says auto titration as the setting. we need a setting range. Once I get the updated order I can process.   Thank you,   Demetrius Charity

## 2022-01-19 NOTE — Telephone Encounter (Signed)
Patient's sister states [place] is requesting a written letter stating the patient needs to be on oxygen. Please assist.

## 2022-01-19 NOTE — Telephone Encounter (Signed)
Sister calls nurse line in regards to Cpap supplies.   Order was placed May 2023, however the patient has not received equipment.   I sent a community message to [employer] to check status of order.

## 2022-01-19 NOTE — Telephone Encounter (Signed)
Sister stated that Lauren Wood needs an oxygen order. Sister did not want to contact PCP or Dr. Posey Pronto  for the order. Please advise.

## 2022-01-19 NOTE — Telephone Encounter (Signed)
Spoke with pt sister, aware according to [employer] they have everything they need. She reports she had talked to them earlier today and does not need anything right now.

## 2022-01-23 NOTE — Addendum Note (Signed)
Addended by: Alcus Dad on: 01/23/2022 05:07 PM   Modules accepted: Orders

## 2022-01-23 NOTE — Telephone Encounter (Signed)
Updated CPAP order placed with titration settings.

## 2022-01-24 NOTE — Telephone Encounter (Signed)
Community message sent to [employer]. Will await response.   Najia Hurlbutt C Edithe Dobbin, RN  

## 2022-01-26 NOTE — Telephone Encounter (Signed)
New, Fredrik Cove, RN; Alonna Minium; Minus Liberty; Ronnald Ramp, Danielle Received, Thank you!

## 2022-01-30 ENCOUNTER — Other Ambulatory Visit: Payer: Self-pay

## 2022-01-31 ENCOUNTER — Telehealth: Payer: Self-pay

## 2022-01-31 MED ORDER — OZEMPIC (0.25 OR 0.5 MG/DOSE) 2 MG/1.5ML ~~LOC~~ SOPN: PEN_INJECTOR | SUBCUTANEOUS | 0 refills | Status: DC

## 2022-01-31 MED ORDER — OZEMPIC (1 MG/DOSE) 4 MG/3ML ~~LOC~~ SOPN
1.0000 mg | PEN_INJECTOR | SUBCUTANEOUS | 2 refills | Status: DC
Start: 2022-01-31 — End: 2022-05-18

## 2022-01-31 NOTE — Telephone Encounter (Signed)
New prescription sent for '1mg'$  dose.    Alcus Dad, MD PGY-3, 

## 2022-01-31 NOTE — Telephone Encounter (Signed)
[facility] calls nurse line requesting clarification of Ozempic dosage.  .'50mg'$  was sent in yesterday by new PCP. However, pharmacy reports she has not been on 0.'5mg'$  since May 2023. Last dose filled on 8/29 was for '1mg'$ .  If patient has been decreased down to 0.'5mg'$  I will need to let pharmacy know. If patient is to be on '1mg'$  please send in a new prescription.   Will forward to PCP.

## 2022-02-22 ENCOUNTER — Other Ambulatory Visit: Payer: Self-pay

## 2022-02-22 MED ORDER — SPIRONOLACTONE 25 MG PO TABS
25.0000 mg | ORAL_TABLET | Freq: Every day | ORAL | 0 refills | Status: DC
Start: 2022-02-22 — End: 2022-05-28

## 2022-03-07 ENCOUNTER — Ambulatory Visit (INDEPENDENT_AMBULATORY_CARE_PROVIDER_SITE_OTHER): Payer: Medicare Other | Admitting: Student

## 2022-03-07 ENCOUNTER — Encounter: Payer: Self-pay | Admitting: Student

## 2022-03-07 VITALS — BP 110/72 | HR 83 | Ht 63.0 in | Wt 308.8 lb

## 2022-03-07 DIAGNOSIS — R197 Diarrhea, unspecified: Secondary | ICD-10-CM

## 2022-03-07 NOTE — Progress Notes (Signed)
    SUBJECTIVE:   CHIEF COMPLAINT / HPI:   61 year old female presents with report of loose stool. She reports having diarrhea everyday for  the past 30 days. She said they are watery and she goes about 3 times a day.  She denies any recent change in medications or recent antibiotic No fever, chills, nausea, vomiting, abdominal pain or blood in stool Patient is unable to confirm current medications.  She reports her sisters manages her medications. Attempted to call patient's sister but unable to reach her or leave a voicemail.    PERTINENT  PMH / PSH: Reviewed  OBJECTIVE:   BP 110/72   Pulse 83   Ht '5\' 3"'$  (1.6 m)   Wt (!) 308 lb 12.8 oz (140.1 kg)   LMP 05/08/2013   SpO2 99%   BMI 54.70 kg/m     Physical Exam General: Alert, well appearing, NAD Cardiovascular: RRR, No Murmurs, Normal S2/S2 Respiratory: CTAB, No wheezing or Rales Abdomen: No distension or tenderness Extremities: No edema on extremities    ASSESSMENT/PLAN:   Diarrhea Patient's history and physical exam suggest diarrhea is less likely due to infectious process.  Recent colonoscopy 5 months ago was unremarkable.  Patient's medication include docusate, Ozempic and colchicine, which could attribute to current diarrhea.  Advised patient to discontinue colchicine and docusate .  If no improvement of symptoms patient can follow-up in 2 weeks.  Obtain BMP to assess patient's kidney function given prolonged history of diarrhea and use of diuretics.  Saw patient's sister ([name]) at the end of the visit. Discuss plans with sister who is her caregiver.  She verbalized understanding and agreeable to plan.   Alen Bleacher, MD [place]

## 2022-03-07 NOTE — Patient Instructions (Addendum)
It was wonderful to meet you today. Thank you for allowing me to be a part of your care. Below is a short summary of what we discussed at your visit today:  Diarrhea could be caused by some of the medications that you are currently taking.    I recommend not taking your colchicine and docusate for the next 2 weeks see if there is any improvement in your diarrhea.  You can return if there is no improvement after discontinuing these medications.  Today we collected lab to check your kidney function given your diarrhea.  Please bring all of your medications to every appointment!  If you have any questions or concerns, please do not hesitate to contact us via phone or MyChart message.   Alen Bleacher, MD [facility]

## 2022-03-08 ENCOUNTER — Other Ambulatory Visit: Payer: Self-pay

## 2022-03-08 DIAGNOSIS — I10 Essential (primary) hypertension: Secondary | ICD-10-CM

## 2022-03-08 LAB — BASIC METABOLIC PANEL
BUN/Creatinine Ratio: 15 (ref 12–28)
BUN: 20 mg/dL (ref 8–27)
CO2: 26 mmol/L (ref 20–29)
Calcium: 9.3 mg/dL (ref 8.7–10.3)
Chloride: 95 mmol/L — ABNORMAL LOW (ref 96–106)
Creatinine, Ser: 1.33 mg/dL — ABNORMAL HIGH (ref 0.57–1.00)
Glucose: 75 mg/dL (ref 70–99)
Potassium: 4.3 mmol/L (ref 3.5–5.2)
Sodium: 137 mmol/L (ref 134–144)
eGFR: 46 mL/min/{1.73_m2} — ABNORMAL LOW (ref >59)

## 2022-03-08 MED ORDER — METOPROLOL TARTRATE 100 MG PO TABS: 100.0000 mg | ORAL_TABLET | Freq: Two times a day (BID) | ORAL | 0 refills | Status: DC

## 2022-03-20 ENCOUNTER — Other Ambulatory Visit: Payer: Self-pay

## 2022-03-20 NOTE — Telephone Encounter (Signed)
Declined colchicine request. This was discontinued by Dr Adah Salvage in October 2023.

## 2022-04-17 ENCOUNTER — Ambulatory Visit: Payer: Medicare Other | Admitting: Podiatry

## 2022-04-19 ENCOUNTER — Other Ambulatory Visit: Payer: Self-pay

## 2022-04-19 MED ORDER — OMEPRAZOLE 20 MG PO CPDR: DELAYED_RELEASE_CAPSULE | ORAL | 0 refills | Status: DC

## 2022-04-24 ENCOUNTER — Ambulatory Visit (INDEPENDENT_AMBULATORY_CARE_PROVIDER_SITE_OTHER): Payer: Medicare Other | Admitting: Podiatry

## 2022-04-24 ENCOUNTER — Encounter: Payer: Self-pay | Admitting: Podiatry

## 2022-04-24 ENCOUNTER — Ambulatory Visit: Payer: Medicare Other | Admitting: Podiatry

## 2022-04-24 DIAGNOSIS — B351 Tinea unguium: Secondary | ICD-10-CM | POA: Diagnosis not present

## 2022-04-24 DIAGNOSIS — M79674 Pain in right toe(s): Secondary | ICD-10-CM

## 2022-04-24 DIAGNOSIS — M79675 Pain in left toe(s): Secondary | ICD-10-CM | POA: Diagnosis not present

## 2022-04-24 NOTE — Progress Notes (Signed)
Subjective:   Patient ID: Lauren Wood, female   DOB: 61 y.o.   MRN: [id number]   HPI Patient presents with elongated nailbeds 1-5 both feet that have been painful   ROS      Objective:  Physical Exam  Neurovascular status unchanged thick yellow brittle nailbeds 1-5 both feet painful     Assessment:  Chronic mycotic nail infections with pain 1-5 both feet     Plan:  Debrided painful nailbeds 1-5 both feet no angiogenic bleeding reappoint routine care

## 2022-05-18 ENCOUNTER — Other Ambulatory Visit: Payer: Self-pay | Admitting: Family Medicine

## 2022-05-19 ENCOUNTER — Other Ambulatory Visit (HOSPITAL_COMMUNITY): Payer: Self-pay

## 2022-05-19 ENCOUNTER — Telehealth: Payer: Self-pay

## 2022-05-19 NOTE — Telephone Encounter (Signed)
A Prior Authorization was initiated for this patients Ozempic (1 MG/DOSE) '4MG'$ /3ML pen-injector through CoverMyMeds.   Key: [place] Patient Advocate

## 2022-05-22 NOTE — Telephone Encounter (Signed)
  Received a fax from [facility] regarding Prior Authorization for Ozempic (1 MG/DOSE) '4MG'$ /3ML pen-injectors.   Authorization has been DENIED due to This drug is used for weight loss purposes, which is one of the classes not covered under the Part D coverage. We do not offer supplemental benefit that would cover this drug. Section 1927(d)(2) of the Social  Security Act (the Act) permits the exclusion of certain drugs or classes of drugs from coverage under Part D..  Selinda Orion CPhT Rx Patient Advocate

## 2022-05-26 ENCOUNTER — Other Ambulatory Visit: Payer: Self-pay | Admitting: Family Medicine

## 2022-05-29 ENCOUNTER — Other Ambulatory Visit: Payer: Self-pay

## 2022-05-29 ENCOUNTER — Other Ambulatory Visit: Payer: Self-pay | Admitting: Family Medicine

## 2022-05-29 NOTE — Telephone Encounter (Signed)
Patient's sister calls nurse line regarding Ozempic. Advised that medication PA had been denied. Forwarding to PCP for next steps.   Talbot Grumbling, RN

## 2022-05-31 NOTE — Telephone Encounter (Signed)
Patient has upcoming office visit with me in 2 days. Will discuss during that appointment.

## 2022-06-02 ENCOUNTER — Other Ambulatory Visit: Payer: Self-pay

## 2022-06-02 ENCOUNTER — Ambulatory Visit (INDEPENDENT_AMBULATORY_CARE_PROVIDER_SITE_OTHER): Payer: Medicare Other | Admitting: Family Medicine

## 2022-06-02 ENCOUNTER — Encounter: Payer: Self-pay | Admitting: Family Medicine

## 2022-06-02 DIAGNOSIS — R413 Other amnesia: Secondary | ICD-10-CM | POA: Diagnosis not present

## 2022-06-02 NOTE — Patient Instructions (Signed)
It was great to see you!  I do not have any specific concerns today. Unfortunately insurance will not approve Ozempic or other weight-loss medications unless you have diabetes. We could consider a referral to our nutritionist if you're interested.   It would be very helpful if your sister comes to future appointments. Please also bring all your medications to every visit.  Schedule your annual wellness visit with one of our nurses on your way out today (or have your sister call to schedule).   Take care, Dr Rock Nephew

## 2022-06-02 NOTE — Progress Notes (Signed)
    SUBJECTIVE:   CHIEF COMPLAINT / HPI:   Patient was seen alone today-- denies complaints. States she feels well. She is not sure of the reason for her visit. Does not know her medications.  Was later able to reach patient's sister by telephone who provides the following history:  Discuss Ozempic Has been on Ozempic for 2 years. Unfortunately her insurance is now denying coverage going forward. Lost 30 lbs on the Ozempic. Sister very concerned that she will re-gain the weight and concerned about what effect this will have on her heart problems.  Memory Changes Sister also wondering if patient needs to be tested for Alzheimer's. Reports she doesn't comprehend things like she used to-- as an example she gets confused about the days of the week whereas she never used to. Symptoms have been present for the past 1 month. Sister also notes she's talking to herself more and the other night thought someone was knocking at the back door in the middle of the night.  PERTINENT  PMH / PSH: Intellectual disability, CKD III, combined CHF  OBJECTIVE:   BP 134/75   Pulse 87   Wt (!) 311 lb 3.2 oz (141.2 kg)   LMP 05/08/2013   SpO2 94%   BMI 55.13 kg/m   Gen: NAD, pleasant, hard of hearing HEENT: [place]/AT, nares patent bilaterally, R TM with chronic perforation, L TM tube in place, poor dentition but oropharynx otherwise unremarkable Neck: supple, no cervical or supraclavicular lymphadenopathy, thyroid smooth and normal in size CV: RRR, normal S1/S2 Resp: Normal effort, lungs CTAB GI: abdomen soft, non-tender, non-distended Extremities: trace nonpitting peripheral edema Skin: warm and dry, no rashes noted Neuro: alert, oriented to person and place, normal gait, walks with walker Psych: appropriate mood and affect, does not appear to be responding to internal stimuli, speech at baseline   ASSESSMENT/PLAN:   Morbid obesity (HCC) Had lost >30lbs on Ozempic. Unfortunately insurance denying  coverage going forward. Her underlying intellectual disability and psychiatric illnesses make weight management very challenging. Not a good candidate for nutritionist as she is unable to meaningfully participate in medical discussions. Will attempt to re-submit prior auth or appeal decision- message sent to pharmacy staff for assistance.   Memory Changes Sister concerned for possible Alzheimer's. Would be challenging to diagnose Alzheimer's in this patient given her baseline intellectual disability. However, based on history, sounds more related to her psychiatric conditions. Advised follow up with her psychiatrist as a starting point.   Alcus Dad, MD [place]

## 2022-06-03 NOTE — Assessment & Plan Note (Signed)
Had lost >30lbs on Ozempic. Unfortunately insurance denying coverage going forward. Her underlying intellectual disability and psychiatric illnesses make weight management very challenging. Not a good candidate for nutritionist as she is unable to meaningfully participate in medical discussions. Will attempt to re-submit prior auth or appeal decision- message sent to pharmacy staff for assistance.

## 2022-07-07 ENCOUNTER — Telehealth: Payer: Self-pay | Admitting: Family Medicine

## 2022-07-07 NOTE — Telephone Encounter (Signed)
Called patient to schedule Medicare Annual Wellness Visit (AWV). No voicemail available to leave a message.  Last date of AWV: Hasn't had one  If patient calls back please forward message to [facility] for scheduling.   If any questions, please contact me at [phone].  Thank you ,  [place] A [place]

## 2022-07-24 ENCOUNTER — Ambulatory Visit: Payer: Medicare Other | Admitting: Podiatry

## 2022-08-03 ENCOUNTER — Other Ambulatory Visit (HOSPITAL_COMMUNITY): Payer: Self-pay

## 2022-08-03 ENCOUNTER — Telehealth: Payer: Self-pay

## 2022-08-03 NOTE — Telephone Encounter (Signed)
Rec'd PA request for patients Ozempic 1mg  dose pens.   Dx of Morbid obesity. Medicare doesn't cover medications for weight loss.

## 2022-08-07 ENCOUNTER — Other Ambulatory Visit: Payer: Self-pay

## 2022-08-07 MED ORDER — FUROSEMIDE 80 MG PO TABS: 80.0000 mg | ORAL_TABLET | Freq: Every day | ORAL | 0 refills | Status: DC

## 2022-08-19 ENCOUNTER — Other Ambulatory Visit: Payer: Self-pay | Admitting: Family Medicine

## 2022-08-21 MED ORDER — ASPIRIN 81 MG PO TBEC: 81.0000 mg | DELAYED_RELEASE_TABLET | Freq: Every day | ORAL | 12 refills | Status: DC

## 2022-08-21 NOTE — Addendum Note (Signed)
Addended by: Maury Dus on: 08/21/2022 01:46 PM   Modules accepted: Orders

## 2022-09-13 ENCOUNTER — Encounter: Payer: Self-pay | Admitting: Podiatry

## 2022-09-13 ENCOUNTER — Ambulatory Visit (INDEPENDENT_AMBULATORY_CARE_PROVIDER_SITE_OTHER): Payer: Medicare Other | Admitting: Podiatry

## 2022-09-13 DIAGNOSIS — B351 Tinea unguium: Secondary | ICD-10-CM

## 2022-09-13 DIAGNOSIS — M79675 Pain in left toe(s): Secondary | ICD-10-CM | POA: Diagnosis not present

## 2022-09-13 DIAGNOSIS — M79674 Pain in right toe(s): Secondary | ICD-10-CM | POA: Diagnosis not present

## 2022-09-13 NOTE — Progress Notes (Signed)
Subjective:   Patient ID: Lauren Wood, female   DOB: 62 y.o.   MRN: [id number]   HPI Patient presents with caregiver and thick yellow brittle nailbeds 1-5 both feet that get incurvated she cannot care for   ROS      Objective:  Physical Exam  Neuro vascular status intact with thick yellow brittle nailbeds 1-5 both feet painful     Assessment:  Chronic chronic nail infection with pain 1-5 both feet     Plan:  Debridement nailbeds 1-5 both feet no angiogenic bleeding reappoint routine care

## 2022-09-18 ENCOUNTER — Encounter (HOSPITAL_COMMUNITY): Payer: Self-pay | Admitting: Emergency Medicine

## 2022-09-18 ENCOUNTER — Ambulatory Visit (HOSPITAL_COMMUNITY)
Admission: EM | Admit: 2022-09-18 | Discharge: 2022-09-18 | Disposition: A | Discharge status: Home / Self Care | Payer: Medicare Other | Attending: Emergency Medicine | Admitting: Emergency Medicine

## 2022-09-18 DIAGNOSIS — Z20822 Contact with and (suspected) exposure to covid-19: Secondary | ICD-10-CM | POA: Diagnosis present

## 2022-09-18 NOTE — ED Triage Notes (Signed)
Pt presents for covid testing due to an exposure. Denies any current symptoms.

## 2022-09-18 NOTE — ED Provider Notes (Signed)
[facility]    CSN: [id number] Arrival date & time: 09/18/22  1703      History   Chief Complaint Chief Complaint  Patient presents with   Covid Testing    HPI Lauren Wood is a 62 y.o. female.   Patient presents requesting COVID testing after known exposure.  Denies all symptoms.  History of obesity, CKD, CHF, pulmonary nodules, heart murmur.  Past Medical History:  Diagnosis Date   ALLERGIC RHINITIS 08/02/2009   Arthritis    Boil of vulva 11/07/2012   Borderline personality disorder (HCC) 05/31/2011   CKD (chronic kidney disease) stage 3, GFR 30-59 ml/min (HCC)    chronic   Gout of foot 09/26/2013   L foot 09/2013. In the setting of HCTZ 25 mg and lasix 40 mg daily. Stopped HCTZ after decreasing to 12.5 mg. Treated with colchicine.     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      PRESSURE ULCER OTHER SITE 10/01/2009   hx of   Sleep apnea    uses cpap, setting of 2   Unspecified psychosis 12/20/2009    Patient Active Problem List   Diagnosis Date Noted   Chronic combined systolic and diastolic heart failure (HCC) 05/18/2021   Nonrheumatic mitral valve regurgitation    Heart failure (HCC) 03/18/2020   Acute respiratory failure with hypoxia (HCC) 03/16/2020   Pulmonary nodules 01/26/2019   Obesity hypoventilation syndrome (HCC)    Intellectual disability    CHF (congestive heart failure) (HCC) 05/10/2018   Otorrhea of left ear 11/26/2017   Diverticulosis of colon 11/22/2016   Mixed conductive and sensorineural hearing loss of right ear with restricted hearing of left ear 07/07/2015   Perforation of right tympanic membrane 07/07/2015   Hx of adenomatous colonic polyps  06/24/2013   Health care maintenance 02/11/2013   OSA (obstructive sleep apnea) 12/11/2012   Borderline personality disorder (HCC) 05/31/2011   CKD (chronic kidney disease) stage 3, GFR 30-59 ml/min (HCC) 02/06/2011   Psychosis (HCC) 12/20/2009   Morbid obesity (HCC) 08/02/2009   MENTAL RETARDATION 08/02/2009   Essential hypertension, benign 08/02/2009   GERD 08/02/2009    Past Surgical History:  Procedure Laterality Date   COLONOSCOPY N/A 06/24/2013   Procedure: COLONOSCOPY;  Surgeon: Iva Boop, MD;  Location: [facility];  Service: Endoscopy;  Laterality: N/A;   COLONOSCOPY WITH PROPOFOL N/A 10/05/2015   Procedure: COLONOSCOPY WITH PROPOFOL;  Surgeon: Iva Boop, MD;  Location: [facility];  Service: Endoscopy;  Laterality: N/A;   COLONOSCOPY WITH PROPOFOL N/A 09/26/2021   Procedure: COLONOSCOPY WITH PROPOFOL;  Surgeon: Iva Boop, MD;  Location: [facility];  Service: Gastroenterology;  Laterality: N/A;   cyst removed  2011   forehead   TEE WITHOUT CARDIOVERSION N/A 03/19/2020   Procedure: TRANSESOPHAGEAL ECHOCARDIOGRAM (TEE);  Surgeon: Lewayne Bunting, MD;  Location: [facility];  Service: Cardiovascular;  Laterality: N/A;   TYMPANOSTOMY TUBE PLACEMENT Bilateral yrs ago    OB History   No obstetric history on file.      Home Medications    Prior to Admission medications   Medication Sig Start  Date End Date Taking? Authorizing Provider  acetaminophen (TYLENOL) 500 MG tablet Take 500 mg by mouth every 6 (six) hours as needed for headache.     [provider]  allopurinol (ZYLOPRIM) 100 MG tablet TAKE ONE TABLET BY MOUTH ONCE DAILY 05/29/22   Maury Dus, MD  aspirin EC 81 MG tablet Take 1 tablet (81 mg total) by mouth daily. Swallow whole. 08/21/22   Maury Dus, MD  benzonatate (TESSALON PERLES) 100 MG capsule Take 2 capsules (200 mg total) by mouth 3 (three) times daily as needed for cough. 05/10/21   Sabino Dick, DO  benztropine  (COGENTIN) 0.5 MG tablet Take 0.5 mg by mouth 2 (two) times daily.    [provider]  buPROPion (WELLBUTRIN XL) 150 MG 24 hr tablet Take 150 mg by mouth every morning.    [provider]  clonazePAM (KLONOPIN) 0.5 MG tablet Take 0.5 tablets (0.25 mg total) by mouth daily as needed for anxiety. Patient taking differently: Take 0.25 mg by mouth 2 (two) times daily. 07/13/20   Sherlyn Lick, MD  colchicine 0.6 MG tablet TAKE ONE-HALF TABLET EVERY OTHER DAY FORGOUT FOR 3 MONTHS Patient taking differently: Take 0.3 mg by mouth daily as needed (Gout). 08/05/20   Sherlyn Lick, MD  docusate sodium (COLACE) 100 MG capsule TAKE ONE CAPSULE TWICE A DAY AS NEEDED FOR MILD OR MODERATE CONSTIPATION Patient taking differently: 100 mg 2 (two) times daily. 08/23/21   Sherlyn Lick, MD  FLUoxetine (PROZAC) 40 MG capsule Take 1 capsule (40 mg total) by mouth every morning. 07/13/20   Sherlyn Lick, MD  fluticasone (FLONASE) 50 MCG/ACT nasal spray Place 2 sprays into both nostrils daily. Patient not taking: Reported on 09/23/2021 05/10/21   Sabino Dick, DO  furosemide (LASIX) 80 MG tablet Take 1 tablet (80 mg total) by mouth daily. 08/07/22   Maury Dus, MD  losartan (COZAAR) 50 MG tablet Take 1 tablet (50 mg total) by mouth daily. 05/19/21   Rollene Rotunda, MD  metoprolol tartrate (LOPRESSOR) 100 MG tablet Take 1 tablet (100 mg total) by mouth 2 (two) times daily. 03/08/22   Maury Dus, MD  mirtazapine (REMERON) 30 MG tablet Take 1 tablet (30 mg total) by mouth at bedtime. 07/13/20   Sherlyn Lick, MD  omeprazole (PRILOSEC) 20 MG capsule TAKE ONE CAPSULE EACH DAY 04/19/22   Maury Dus, MD  OZEMPIC, 1 MG/DOSE, 4 MG/3ML SOPN INJECT 1mg  into THE SKIN ONCE a WEEK 05/18/22   Paige, Lucas Mallow, DO  pregabalin (LYRICA) 50 MG capsule Take 1 capsule (50 mg total) by mouth daily. 07/13/20   Sherlyn Lick, MD  risperiDONE (RISPERDAL) 3 MG tablet Take 3 mg by  mouth 3 (three) times daily. 11/25/20   [provider]  spironolactone (ALDACTONE) 25 MG tablet TAKE ONE TABLET BY MOUTH DAILY 08/21/22   Maury Dus, MD  traZODone (DESYREL) 100 MG tablet Take 100 mg by mouth at bedtime.    [provider]    Family History Family History  Problem Relation Age of Onset   Colon cancer Father        around age 48   Diabetes Mother    Esophageal cancer Neg Hx    Pancreatic cancer Neg Hx    Stomach cancer Neg Hx    Liver disease Neg Hx     Social History Social History   Tobacco Use   Smoking status: Never   Smokeless tobacco: Never  Vaping Use  Vaping Use: Never used  Substance Use Topics   Alcohol use: No    Alcohol/week: 0.0 standard drinks of alcohol   Drug use: No     Allergies   Enalapril maleate, Hctz [hydrochlorothiazide], and Lasix [furosemide]   Review of Systems Review of Systems  Constitutional: Negative.   HENT: Negative.    Respiratory: Negative.    Cardiovascular: Negative.   Gastrointestinal: Negative.   Genitourinary: Negative.      Physical Exam Triage Vital Signs ED Triage Vitals [09/18/22 1807]  Enc Vitals Group     BP (!) 134/91     Pulse Rate 74     Resp 19     Temp 98.2 F (36.8 C)     Temp Source Oral     SpO2 94 %     Weight      Height      Head Circumference      Peak Flow      Pain Score 0     Pain Loc      Pain Edu?      Excl. in GC?    No data found.  Updated Vital Signs BP (!) 134/91 (BP Location: Right Arm)   Pulse 74   Temp 98.2 F (36.8 C) (Oral)   Resp 19   LMP 05/08/2013   SpO2 94%   Visual Acuity Right Eye Distance:   Left Eye Distance:   Bilateral Distance:    Right Eye Near:   Left Eye Near:    Bilateral Near:     Physical Exam Constitutional:      Appearance: Normal appearance.  Eyes:     Extraocular Movements: Extraocular movements intact.  Pulmonary:     Effort: Pulmonary effort is normal.  Neurological:     Mental Status: She is  alert and oriented to person, place, and time. Mental status is at baseline.      UC Treatments / Results  Labs (all labs ordered are listed, but only abnormal results are displayed) Labs Reviewed  SARS CORONAVIRUS 2 (TAT 6-24 HRS)    EKG   Radiology No results found.  Procedures Procedures (including critical care time)  Medications Ordered in UC Medications - No data to display  Initial Impression / Assessment and Plan / UC Course  I have reviewed the triage vital signs and the nursing notes.  Pertinent labs & imaging results that were available during my care of the patient were reviewed by me and considered in my medical decision making (see chart for details).  Encounter for laboratory testing for COVID-19 virus  Vital signs are stable patient is in no signs of distress nontoxic-appearing, asymptomatic at this time, COVID test is pending, based on medical history qualifies for antiviral if positive, discussed quarantine guidelines, discussed additional treatment management if symptoms are to occur, may follow-up with his urgent care as needed Final Clinical Impressions(s) / UC Diagnoses   Final diagnoses:  Encounter for laboratory testing for COVID-19 virus     Discharge Instructions      COVID test is pending up to 24 hours, you will be notified of positive test results only  Current quarantine guidelines require quarantining if with fever then may return to activity  If positive for COVID based on your medical history you may receive antiviral treatment which helps to reduce the amount of virus in your body reducing symptoms and timeline that you are ill, does not fully take away illness, this medicine will be sent in at  time of notification  You can take Tylenol and/or Ibuprofen as needed for fever reduction and pain relief.   For cough: honey 1/2 to 1 teaspoon (you can dilute the honey in water or another fluid).  You can also use guaifenesin and  dextromethorphan for cough. You can use a humidifier for chest congestion and cough.  If you don't have a humidifier, you can sit in the bathroom with the hot shower running.      For sore throat: try warm salt water gargles, cepacol lozenges, throat spray, warm tea or water with lemon/honey, popsicles or ice, or OTC cold relief medicine for throat discomfort.   For congestion: take a daily anti-histamine like Zyrtec, Claritin, and a oral decongestant, such as pseudoephedrine.  You can also use Flonase 1-2 sprays in each nostril daily.   It is important to stay hydrated: drink plenty of fluids (water, gatorade/powerade/pedialyte, juices, or teas) to keep your throat moisturized and help further relieve irritation/discomfort.    ED Prescriptions   None    PDMP not reviewed this encounter.   Kenlynn, [place], [place] 09/18/22 [phone]

## 2022-09-18 NOTE — Discharge Instructions (Addendum)
COVID test is pending up to 24 hours, you will be notified of positive test results only  Current quarantine guidelines require quarantining if with fever then may return to activity  If positive for COVID based on your medical history you may receive antiviral treatment which helps to reduce the amount of virus in your body reducing symptoms and timeline that you are ill, does not fully take away illness, this medicine will be sent in at time of notification  You can take Tylenol and/or Ibuprofen as needed for fever reduction and pain relief.   For cough: honey 1/2 to 1 teaspoon (you can dilute the honey in water or another fluid).  You can also use guaifenesin and dextromethorphan for cough. You can use a humidifier for chest congestion and cough.  If you don't have a humidifier, you can sit in the bathroom with the hot shower running.      For sore throat: try warm salt water gargles, cepacol lozenges, throat spray, warm tea or water with lemon/honey, popsicles or ice, or OTC cold relief medicine for throat discomfort.   For congestion: take a daily anti-histamine like Zyrtec, Claritin, and a oral decongestant, such as pseudoephedrine.  You can also use Flonase 1-2 sprays in each nostril daily.   It is important to stay hydrated: drink plenty of fluids (water, gatorade/powerade/pedialyte, juices, or teas) to keep your throat moisturized and help further relieve irritation/discomfort.  

## 2022-09-19 LAB — SARS CORONAVIRUS 2 (TAT 6-24 HRS): SARS Coronavirus 2: NEGATIVE

## 2022-09-21 ENCOUNTER — Ambulatory Visit (INDEPENDENT_AMBULATORY_CARE_PROVIDER_SITE_OTHER): Payer: Medicare Other | Admitting: Family Medicine

## 2022-09-21 ENCOUNTER — Encounter: Payer: Self-pay | Admitting: Family Medicine

## 2022-09-21 DIAGNOSIS — I5042 Chronic combined systolic (congestive) and diastolic (congestive) heart failure: Secondary | ICD-10-CM | POA: Diagnosis not present

## 2022-09-21 DIAGNOSIS — N183 Chronic kidney disease, stage 3 unspecified: Secondary | ICD-10-CM | POA: Diagnosis not present

## 2022-09-21 DIAGNOSIS — Z131 Encounter for screening for diabetes mellitus: Secondary | ICD-10-CM | POA: Diagnosis not present

## 2022-09-21 DIAGNOSIS — N189 Chronic kidney disease, unspecified: Secondary | ICD-10-CM

## 2022-09-21 NOTE — Progress Notes (Signed)
    SUBJECTIVE:   CHIEF COMPLAINT / HPI:   Discuss Ozempic -insurance stopped covering ozempic -sister (legal guardian) concerned as she is gaining weight -sister very concerned about the effect on her heart and kidneys given her history of CHF and CKD -patient has intellectual disability which complicates her care -nutritionist not on option due to intellectual disability -bariatric surgery not an option (sister states cardiologist felt she was not a surgical candidate in general due to comorbidities) -patient at adult daycare during the day, they serve meals there, she gets dinner at home from sister  PERTINENT  PMH / PSH: CHF (combined type), CKD  OBJECTIVE:   BP 119/74   Pulse 60   Ht 5\' 3"  (1.6 m)   Wt (!) 315 lb 12.8 oz (143.2 kg)   LMP 05/08/2013   SpO2 96%   BMI 55.94 kg/m   Gen: NAD, obese, pleasant, hard of hearing CV: RRR, normal S1/S2, no murmur Resp: Normal effort, lungs CTAB Extremities: no edema or cyanosis Skin: warm and dry, no rashes noted Neuro: alert, no obvious focal deficits Psych: Normal affect and mood   ASSESSMENT/PLAN:   Chronic combined systolic and diastolic heart failure (HCC) Not in acute exacerbation. Would benefit from resuming Ozempic- new Rx sent, hopefully will be covered. Unclear why she's not on SGLT2. Advised follow-up with her cardiologist.  CKD (chronic kidney disease) stage 3, GFR 30-59 ml/min (HCC) Check updated BMP today  Morbid obesity (HCC) Check screening lipid panel and A1c today. Hopefully can get GLP1 covered due to heart failure and CKD. Difficult situation-- not a candidate for bariatric surgery and nutritionist would be low utility given her intellectual disability (unable to meaningfully participate). Encouraged more physical activity at her day program to start.     Maury Dus, MD [facility]

## 2022-09-21 NOTE — Patient Instructions (Addendum)
It was great to see you!  We are checking some blood work today. I will send you a Mychart message with the results.  Please make an appointment with your cardiologist. You are overdue to see them.  I will see if we can get Ozempic or a similar medication covered.  Take care, Dr Anner Crete

## 2022-09-22 LAB — BASIC METABOLIC PANEL
BUN/Creatinine Ratio: 17 (ref 12–28)
BUN: 23 mg/dL (ref 8–27)
CO2: 26 mmol/L (ref 20–29)
Calcium: 9.7 mg/dL (ref 8.7–10.3)
Chloride: 101 mmol/L (ref 96–106)
Creatinine, Ser: 1.36 mg/dL — ABNORMAL HIGH (ref 0.57–1.00)
Glucose: 86 mg/dL (ref 70–99)
Potassium: 4.5 mmol/L (ref 3.5–5.2)
Sodium: 142 mmol/L (ref 134–144)
eGFR: 44 mL/min/{1.73_m2} — ABNORMAL LOW (ref >59)

## 2022-09-22 LAB — HEMOGLOBIN A1C
Est. average glucose Bld gHb Est-mCnc: 117 mg/dL
Hgb A1c MFr Bld: 5.7 % — ABNORMAL HIGH (ref 4.8–5.6)

## 2022-09-22 LAB — LIPID PANEL
Chol/HDL Ratio: 2.9 ratio (ref 0.0–4.4)
Cholesterol, Total: 134 mg/dL (ref 100–199)
HDL: 46 mg/dL (ref >39)
LDL Chol Calc (NIH): 73 mg/dL (ref 0–99)
Triglycerides: 74 mg/dL (ref 0–149)
VLDL Cholesterol Cal: 15 mg/dL (ref 5–40)

## 2022-09-22 MED ORDER — SEMAGLUTIDE(0.25 OR 0.5MG/DOS) 2 MG/1.5ML ~~LOC~~ SOPN: 0.5000 mg | PEN_INJECTOR | SUBCUTANEOUS | 1 refills | Status: DC

## 2022-09-22 NOTE — Assessment & Plan Note (Signed)
Check screening lipid panel and A1c today. Hopefully can get GLP1 covered due to heart failure and CKD. Difficult situation-- not a candidate for bariatric surgery and nutritionist would be low utility given her intellectual disability (unable to meaningfully participate). Encouraged more physical activity at her day program to start.

## 2022-09-22 NOTE — Assessment & Plan Note (Signed)
Check updated BMP today

## 2022-09-22 NOTE — Assessment & Plan Note (Addendum)
Not in acute exacerbation. Would benefit from resuming Ozempic- new Rx sent, hopefully will be covered. Unclear why she's not on SGLT2. Advised follow-up with her cardiologist.

## 2022-09-25 ENCOUNTER — Telehealth: Payer: Self-pay

## 2022-09-25 ENCOUNTER — Other Ambulatory Visit (HOSPITAL_COMMUNITY): Payer: Self-pay

## 2022-09-25 NOTE — Telephone Encounter (Signed)
A Prior Authorization was initiated for this patients OZEMPIC through CoverMyMeds.   Dx. CKD and CHF  Key: [id number]

## 2022-09-26 ENCOUNTER — Encounter: Payer: Self-pay | Admitting: Family Medicine

## 2022-09-26 ENCOUNTER — Other Ambulatory Visit: Payer: Self-pay | Admitting: Family Medicine

## 2022-09-26 DIAGNOSIS — I10 Essential (primary) hypertension: Secondary | ICD-10-CM

## 2022-09-26 NOTE — Telephone Encounter (Signed)
Prior Auth for patients medication OZEMPIC denied by [facility] MEDICARE via CoverMyMeds.   Reason:    CoverMyMeds Key: [id number]

## 2022-09-28 ENCOUNTER — Other Ambulatory Visit: Payer: Self-pay

## 2022-09-28 MED ORDER — DOCUSATE SODIUM 100 MG PO CAPS: 100.0000 mg | ORAL_CAPSULE | Freq: Two times a day (BID) | ORAL | 2 refills | Status: DC

## 2022-10-05 ENCOUNTER — Ambulatory Visit (INDEPENDENT_AMBULATORY_CARE_PROVIDER_SITE_OTHER): Payer: Medicare Other

## 2022-10-05 DIAGNOSIS — G609 Hereditary and idiopathic neuropathy, unspecified: Secondary | ICD-10-CM

## 2022-10-05 NOTE — Progress Notes (Signed)
Patient presents to the office today for shoe and insole measuring.  Patient was measured with brannock device to determine size and width for 1 pair of extra depth shoes and foam casted for 3 pair of insoles.   ABN signed.   Shoes and insoles will be ordered at that time and patient will be notified for an appointment for fitting when they arrive.    Patient shoe selection-   1st   Shoe choice:   brooks black velcro  Shoe size ordered: 10.5

## 2022-11-06 NOTE — Progress Notes (Unsigned)
Cardiology Office Note:   Date:  11/08/2022  ID:  Lauren Wood, DOB April 27, 1961, MRN [id number] PCP: Maury Dus, MD   HeartCare Providers Cardiologist:  Rollene Rotunda, MD {  History of Present Illness:   Lauren Wood is a 62 y.o. female who has a history of chronic systolic HF.    She is on home O2.  She gets around with a walker.  She has been dyspneic with exertion.  Her family reports that they noticed she is breathing fast when she moves around her oxygen levels usually good.  She has oxygen at home but she has not been using it and they said they are going to take away because she has not been using it.  However, there apparently is some dyspnea with exertion and she is having she says "quick breathing" when she walks.  She is also has "quick breathing" when she is sleeping.  She uses CPAP but her family reports that it is outdated.  She is not having any chest pressure, neck or arm discomfort.  She chronically sleeps on 4 pillows.  Apparently she has had some leg discomfort that seems to go down from her hip.     ROS: As stated in the HPI and negative for all other systems.  Studies Reviewed:    EKG:   EKG Interpretation  Date/Time:  Wednesday November 08 2022 08:40:31 EDT Ventricular Rate:  71 PR Interval:  206 QRS Duration: 172 QT Interval:  450 QTC Calculation: 489 R Axis:   -64 Text Interpretation: Sinus rhythm with frequent Premature ventricular complexes Left axis deviation Left bundle branch block When compared with ECG of 21-Mar-2020 08:09, Premature ventricular complexes are now Present ectopy is new Confirmed by Rollene Rotunda ([id number]) on 11/08/2022 8:42:16 AM    Risk Assessment/Calculations:              Physical Exam:   VS:  BP 118/62 (BP Location: Right Arm, Patient Position: Sitting, Cuff Size: Large)   Pulse 71   Ht 5\' 3"  (1.6 m)   Wt (!) 316 lb 9.6 oz (143.6 kg)   LMP 05/08/2013   SpO2 92%   BMI 56.08 kg/m    Wt Readings from Last 3  Encounters:  11/08/22 (!) 316 lb 9.6 oz (143.6 kg)  09/21/22 (!) 315 lb 12.8 oz (143.2 kg)  06/02/22 (!) 311 lb 3.2 oz (141.2 kg)     GEN: Well nourished, well developed in no acute distress NECK: No JVD; No carotid bruits CARDIAC: RRR, no murmurs, rubs, gallops RESPIRATORY:  Clear to auscultation without rales, wheezing or rhonchi  ABDOMEN: Soft, non-tender, non-distended EXTREMITIES:  No edema; No deformity   ASSESSMENT AND PLAN:   CHRONIC SYSTOLIC AND  DIASTOLIC HF:   EF was 35 - 40% on TEE in 2021 she seems to be euvolemic.  No change in therapy.   CKD III: Her creatinine was most recently 1.36 which is slightly higher than previous.   This has been checked recently.  No change in therapy.   HTN: The blood pressure is at target.  No change in therapy.    MORBID OBESITY:   She was getting semaglutide but her primary care who is changed recently said they were having trouble getting this and they wondered if we might be able to intervene.  She had good weight loss on the  MR:   This was severe in 2021 on TEE.  She was not thought to be a surgical candidate and  would be even high risk potentially for MitraClip.  I will continue to follow this clinically.  She has been previously evaluated by structural heart for this.  SLEEP APNEA: She needs a repeat study because her equipment is old and she might need oxygen.  On the order an inpatient sleep study.     Follow up with me in one year.    Signed, Rollene Rotunda, MD

## 2022-11-07 ENCOUNTER — Ambulatory Visit: Payer: Medicare (Managed Care)

## 2022-11-08 ENCOUNTER — Other Ambulatory Visit: Payer: Self-pay | Admitting: Family Medicine

## 2022-11-08 ENCOUNTER — Ambulatory Visit: Payer: Medicare (Managed Care) | Attending: Cardiology | Admitting: Cardiology

## 2022-11-08 ENCOUNTER — Encounter: Payer: Self-pay | Admitting: Cardiology

## 2022-11-08 VITALS — BP 118/62 | HR 71 | Ht 63.0 in | Wt 316.6 lb

## 2022-11-08 DIAGNOSIS — R0683 Snoring: Secondary | ICD-10-CM

## 2022-11-08 DIAGNOSIS — I340 Nonrheumatic mitral (valve) insufficiency: Secondary | ICD-10-CM

## 2022-11-08 DIAGNOSIS — I10 Essential (primary) hypertension: Secondary | ICD-10-CM

## 2022-11-08 DIAGNOSIS — I5042 Chronic combined systolic (congestive) and diastolic (congestive) heart failure: Secondary | ICD-10-CM

## 2022-11-08 NOTE — Patient Instructions (Signed)
Medication Instructions:  Your physician recommends that you continue on your current medications as directed. Please refer to the Current Medication list given to you today. *If you need a refill on your cardiac medications before your next appointment, please call your pharmacy*   Lab Work: None ordered If you have labs (blood work) drawn today and your tests are completely normal, you will receive your results only by: MyChart Message (if you have MyChart) OR A paper copy in the mail If you have any lab test that is abnormal or we need to change your treatment, we will call you to review the results.   Testing/Procedures: Your physician has recommended that you have a sleep study. This test records several body functions during sleep, including: brain activity, eye movement, oxygen and carbon dioxide blood levels, heart rate and rhythm, breathing rate and rhythm, the flow of air through your mouth and nose, snoring, body muscle movements, and chest and belly movement.   Follow-Up: At [facility], you and your health needs are our priority.  As part of our continuing mission to provide you with exceptional heart care, we have created designated Provider Care Teams.  These Care Teams include your primary Cardiologist (physician) and Advanced Practice Providers (APPs -  Physician Assistants and Nurse Practitioners) who all work together to provide you with the care you need, when you need it.  We recommend signing up for the patient portal called "MyChart".  Sign up information is provided on this After Visit Summary.  MyChart is used to connect with patients for Virtual Visits (Telemedicine).  Patients are able to view lab/test results, encounter notes, upcoming appointments, etc.  Non-urgent messages can be sent to your provider as well.   To learn more about what you can do with MyChart, go to [email or web address].    Your next appointment:   12 month(s)  Provider:   Rollene Rotunda, MD     Other Instructions

## 2022-11-10 NOTE — Addendum Note (Signed)
Addended by: Brunetta Genera on: 11/10/2022 02:27 PM   Modules accepted: Orders

## 2022-11-14 ENCOUNTER — Ambulatory Visit: Payer: Medicare Other

## 2022-11-16 ENCOUNTER — Emergency Department (HOSPITAL_COMMUNITY): Payer: Medicare (Managed Care)

## 2022-11-16 ENCOUNTER — Other Ambulatory Visit: Payer: Self-pay

## 2022-11-16 ENCOUNTER — Encounter (HOSPITAL_COMMUNITY): Payer: Self-pay

## 2022-11-16 ENCOUNTER — Inpatient Hospital Stay (HOSPITAL_COMMUNITY)
Admission: EM | Admit: 2022-11-16 | Discharge: 2022-11-18 | DRG: 291 | Disposition: A | Discharge status: Home / Self Care | Payer: Medicare (Managed Care) | Attending: Family Medicine | Admitting: Family Medicine

## 2022-11-16 DIAGNOSIS — I5042 Chronic combined systolic (congestive) and diastolic (congestive) heart failure: Secondary | ICD-10-CM | POA: Diagnosis present

## 2022-11-16 DIAGNOSIS — Z888 Allergy status to other drugs, medicaments and biological substances status: Secondary | ICD-10-CM | POA: Diagnosis not present

## 2022-11-16 DIAGNOSIS — E662 Morbid (severe) obesity with alveolar hypoventilation: Secondary | ICD-10-CM | POA: Diagnosis present

## 2022-11-16 DIAGNOSIS — M109 Gout, unspecified: Secondary | ICD-10-CM | POA: Diagnosis present

## 2022-11-16 DIAGNOSIS — Z79899 Other long term (current) drug therapy: Secondary | ICD-10-CM

## 2022-11-16 DIAGNOSIS — I130 Hypertensive heart and chronic kidney disease with heart failure and stage 1 through stage 4 chronic kidney disease, or unspecified chronic kidney disease: Secondary | ICD-10-CM | POA: Diagnosis present

## 2022-11-16 DIAGNOSIS — N183 Chronic kidney disease, stage 3 unspecified: Secondary | ICD-10-CM | POA: Diagnosis present

## 2022-11-16 DIAGNOSIS — Z6841 Body Mass Index (BMI) 40.0 and over, adult: Secondary | ICD-10-CM | POA: Diagnosis not present

## 2022-11-16 DIAGNOSIS — N390 Urinary tract infection, site not specified: Secondary | ICD-10-CM | POA: Diagnosis present

## 2022-11-16 DIAGNOSIS — I5021 Acute systolic (congestive) heart failure: Secondary | ICD-10-CM | POA: Diagnosis not present

## 2022-11-16 DIAGNOSIS — F79 Unspecified intellectual disabilities: Secondary | ICD-10-CM | POA: Diagnosis present

## 2022-11-16 DIAGNOSIS — J9601 Acute respiratory failure with hypoxia: Secondary | ICD-10-CM | POA: Diagnosis present

## 2022-11-16 DIAGNOSIS — Z7982 Long term (current) use of aspirin: Secondary | ICD-10-CM

## 2022-11-16 DIAGNOSIS — T443X5A Adverse effect of other parasympatholytics [anticholinergics and antimuscarinics] and spasmolytics, initial encounter: Secondary | ICD-10-CM | POA: Diagnosis present

## 2022-11-16 DIAGNOSIS — I5023 Acute on chronic systolic (congestive) heart failure: Secondary | ICD-10-CM | POA: Diagnosis not present

## 2022-11-16 DIAGNOSIS — F603 Borderline personality disorder: Secondary | ICD-10-CM | POA: Diagnosis present

## 2022-11-16 DIAGNOSIS — J309 Allergic rhinitis, unspecified: Secondary | ICD-10-CM | POA: Diagnosis present

## 2022-11-16 DIAGNOSIS — Z7985 Long-term (current) use of injectable non-insulin antidiabetic drugs: Secondary | ICD-10-CM | POA: Diagnosis not present

## 2022-11-16 DIAGNOSIS — R4182 Altered mental status, unspecified: Secondary | ICD-10-CM | POA: Diagnosis not present

## 2022-11-16 DIAGNOSIS — I5043 Acute on chronic combined systolic (congestive) and diastolic (congestive) heart failure: Secondary | ICD-10-CM | POA: Diagnosis present

## 2022-11-16 DIAGNOSIS — I10 Essential (primary) hypertension: Secondary | ICD-10-CM | POA: Diagnosis present

## 2022-11-16 DIAGNOSIS — E035 Myxedema coma: Secondary | ICD-10-CM | POA: Diagnosis present

## 2022-11-16 DIAGNOSIS — F319 Bipolar disorder, unspecified: Secondary | ICD-10-CM | POA: Diagnosis present

## 2022-11-16 DIAGNOSIS — R569 Unspecified convulsions: Secondary | ICD-10-CM | POA: Diagnosis not present

## 2022-11-16 DIAGNOSIS — I509 Heart failure, unspecified: Secondary | ICD-10-CM

## 2022-11-16 LAB — I-STAT CHEM 8, ED
BUN: 23 mg/dL (ref 8–23)
Calcium, Ion: 1.2 mmol/L (ref 1.15–1.40)
Chloride: 105 mmol/L (ref 98–111)
Creatinine, Ser: 1.4 mg/dL — ABNORMAL HIGH (ref 0.44–1.00)
Glucose, Bld: 113 mg/dL — ABNORMAL HIGH (ref 70–99)
HCT: 33 % — ABNORMAL LOW (ref 36.0–46.0)
Hemoglobin: 11.2 g/dL — ABNORMAL LOW (ref 12.0–15.0)
Potassium: 4 mmol/L (ref 3.5–5.1)
Sodium: 140 mmol/L (ref 135–145)
TCO2: 23 mmol/L (ref 22–32)

## 2022-11-16 LAB — COMPREHENSIVE METABOLIC PANEL
ALT: 22 U/L (ref 0–44)
AST: 37 U/L (ref 15–41)
Albumin: 3.2 g/dL — ABNORMAL LOW (ref 3.5–5.0)
Alkaline Phosphatase: 147 U/L — ABNORMAL HIGH (ref 38–126)
Anion gap: 9 (ref 5–15)
BUN: 22 mg/dL (ref 8–23)
CO2: 22 mmol/L (ref 22–32)
Calcium: 8.7 mg/dL — ABNORMAL LOW (ref 8.9–10.3)
Chloride: 105 mmol/L (ref 98–111)
Creatinine, Ser: 1.41 mg/dL — ABNORMAL HIGH (ref 0.44–1.00)
GFR, Estimated: 42 mL/min — ABNORMAL LOW (ref >60)
Glucose, Bld: 116 mg/dL — ABNORMAL HIGH (ref 70–99)
Potassium: 4 mmol/L (ref 3.5–5.1)
Sodium: 136 mmol/L (ref 135–145)
Total Bilirubin: 0.3 mg/dL (ref 0.3–1.2)
Total Protein: 6.7 g/dL (ref 6.5–8.1)

## 2022-11-16 LAB — I-STAT VENOUS BLOOD GAS, ED
Acid-Base Excess: 0 mmol/L (ref 0.0–2.0)
Acid-base deficit: 2 mmol/L (ref 0.0–2.0)
Bicarbonate: 24.9 mmol/L (ref 20.0–28.0)
Bicarbonate: 26.2 mmol/L (ref 20.0–28.0)
Calcium, Ion: 1.2 mmol/L (ref 1.15–1.40)
Calcium, Ion: 1.18 mmol/L (ref 1.15–1.40)
HCT: 32 % — ABNORMAL LOW (ref 36.0–46.0)
HCT: 33 % — ABNORMAL LOW (ref 36.0–46.0)
Hemoglobin: 10.9 g/dL — ABNORMAL LOW (ref 12.0–15.0)
Hemoglobin: 11.2 g/dL — ABNORMAL LOW (ref 12.0–15.0)
O2 Saturation: 99 %
O2 Saturation: 90 %
Potassium: 4.1 mmol/L (ref 3.5–5.1)
Potassium: 4.8 mmol/L (ref 3.5–5.1)
Sodium: 140 mmol/L (ref 135–145)
Sodium: 139 mmol/L (ref 135–145)
TCO2: 26 mmol/L (ref 22–32)
TCO2: 28 mmol/L (ref 22–32)
pCO2, Ven: 51.3 mmHg (ref 44–60)
pCO2, Ven: 50.7 mmHg (ref 44–60)
pH, Ven: 7.294 (ref 7.25–7.43)
pH, Ven: 7.321 (ref 7.25–7.43)
pO2, Ven: 130 mmHg — ABNORMAL HIGH (ref 32–45)
pO2, Ven: 65 mmHg — ABNORMAL HIGH (ref 32–45)

## 2022-11-16 LAB — BRAIN NATRIURETIC PEPTIDE: B Natriuretic Peptide: 570.6 pg/mL — ABNORMAL HIGH (ref 0.0–100.0)

## 2022-11-16 LAB — CBC WITH DIFFERENTIAL/PLATELET
Abs Immature Granulocytes: 0.07 10*3/uL (ref 0.00–0.07)
Basophils Absolute: 0 10*3/uL (ref 0.0–0.1)
Basophils Relative: 0 %
Eosinophils Absolute: 0.3 10*3/uL (ref 0.0–0.5)
Eosinophils Relative: 3 %
HCT: 32.9 % — ABNORMAL LOW (ref 36.0–46.0)
Hemoglobin: 9.8 g/dL — ABNORMAL LOW (ref 12.0–15.0)
Immature Granulocytes: 1 %
Lymphocytes Relative: 17 %
Lymphs Abs: 1.7 10*3/uL (ref 0.7–4.0)
MCH: 27.1 pg (ref 26.0–34.0)
MCHC: 29.8 g/dL — ABNORMAL LOW (ref 30.0–36.0)
MCV: 91.1 fL (ref 80.0–100.0)
Monocytes Absolute: 0.9 10*3/uL (ref 0.1–1.0)
Monocytes Relative: 9 %
Neutro Abs: 7 10*3/uL (ref 1.7–7.7)
Neutrophils Relative %: 70 %
Platelets: 215 10*3/uL (ref 150–400)
RBC: 3.61 MIL/uL — ABNORMAL LOW (ref 3.87–5.11)
RDW: 14.2 % (ref 11.5–15.5)
WBC: 10 10*3/uL (ref 4.0–10.5)
nRBC: 0 % (ref 0.0–0.2)

## 2022-11-16 LAB — TROPONIN I (HIGH SENSITIVITY)
Troponin I (High Sensitivity): 20 ng/L — ABNORMAL HIGH (ref <18)
Troponin I (High Sensitivity): 24 ng/L — ABNORMAL HIGH (ref <18)

## 2022-11-16 LAB — CBG MONITORING, ED
Glucose-Capillary: 107 mg/dL — ABNORMAL HIGH (ref 70–99)
Glucose-Capillary: 89 mg/dL (ref 70–99)

## 2022-11-16 LAB — LACTIC ACID, PLASMA
Lactic Acid, Venous: 1.6 mmol/L (ref 0.5–1.9)
Lactic Acid, Venous: 1.4 mmol/L (ref 0.5–1.9)

## 2022-11-16 MED ORDER — FLUTICASONE PROPIONATE 50 MCG/ACT NA SUSP
2.0000 | Freq: Every day | NASAL | Status: DC
  Administered 2022-11-17: 2 via NASAL
  Filled 2022-11-16: qty 16

## 2022-11-16 MED ORDER — ONDANSETRON HCL 4 MG/2ML IJ SOLN
4.0000 mg | Freq: Once | INTRAMUSCULAR | Status: AC
  Administered 2022-11-16: 4 mg via INTRAVENOUS
  Filled 2022-11-16: qty 2

## 2022-11-16 MED ORDER — ENOXAPARIN SODIUM 40 MG/0.4ML IJ SOSY
40.0000 mg | PREFILLED_SYRINGE | Freq: Every day | INTRAMUSCULAR | Status: DC
  Administered 2022-11-17: 40 mg via SUBCUTANEOUS
  Filled 2022-11-16: qty 0.4

## 2022-11-16 MED ORDER — FUROSEMIDE 10 MG/ML IJ SOLN
60.0000 mg | Freq: Once | INTRAMUSCULAR | Status: AC
  Administered 2022-11-17: 60 mg via INTRAVENOUS
  Filled 2022-11-16: qty 6

## 2022-11-16 MED ORDER — FUROSEMIDE 10 MG/ML IJ SOLN
60.0000 mg | Freq: Once | INTRAMUSCULAR | Status: AC
  Administered 2022-11-16: 60 mg via INTRAVENOUS
  Filled 2022-11-16: qty 6

## 2022-11-16 NOTE — Assessment & Plan Note (Signed)
-   Held home BP medication d/t soft BP, consider restarting in AM.

## 2022-11-16 NOTE — ED Triage Notes (Signed)
PT BIB EMS from home, was found in the bathroom by family unresponsive on the toilet, frothy sputum around her mouth, was alert to voice with EMS.  Able to stand with assistance to stretcher, shallow respirations, productive cough, pink frothy sputum, 3L [place] at baseline.   CBHG 72 BP 124/86 HR 66 100 nonrebreather Resp 40 12 Lead LBB w PVC

## 2022-11-16 NOTE — H&P (Deleted)
Hospital Admission History and Physical Service Pager: [phone]  Patient name: Lauren Wood Medical record number: [id number] Date of Birth: 01/17/1961 Age: 62 y.o. Gender: female  Primary Care Provider: Nestor Ramp, MD Consultants: *** Code Status: FULL which was confirmed with family if patient unable to confirm   Preferred Emergency Contact:  Contact Information     Name Relation Home Work Lauren Wood Sister [phone]  [phone]        Chief Complaint: 62 yo F with PMHx of CHF (last echo 2021 w EF 35%-40%).   Assessment and Plan: Lauren Wood is a 63 y.o. female presenting with *** . Differential for presentation of this includes ***.   **The problems are not reviewed yet. Please review them in the "Problem  List" activity and refresh this SmartLink**    FEN/GI: *** VTE Prophylaxis: ***  Disposition: ***  History of Present Illness:  Lauren Wood is a 62 y.o. female presenting with ***  Passed out in the bathroom and fell. Took her oxygen off and then passed out.  Denies any sick symptoms. Went to her cardiologist a few days ago and told them about her shortness of breath. They ordered another sleep apnea test.  Was acting nauseas and felt like she was going to vomit at the group home, they said she seemed like she was out of breath all day today. She had dry heaving. She was also like this yesterday.   Mood at home has been normal. Taking medicines as scheduled. She is no longer on home oxygen, but Lauren Wood started to give it to her 3 days ago. Denies fever, cough, other sick symptoms.  Has been very sleepy today for the past few days when she came home from the day program.   Patient is oriented to location, year   In the ED, ***  Review Of Systems: Per HPI with the following additions: ***  Pertinent Past Medical History: Intellectual delay Heart failure Depression Bipolar Hypertension Shizophrenia Remainder reviewed in  history tab.   Pertinent Past Surgical History: ***  Remainder reviewed in history tab.  Pertinent Social History: Tobacco use: Yes/No/Former Alcohol use: *** Other Substance use: *** Lives with ***  Pertinent Family History: ***  Remainder reviewed in history tab.   Important Outpatient Medications: *** Remainder reviewed in medication history.   Objective: BP 124/83   Pulse 72   Temp 97.9 F (36.6 C) (Oral)   Resp (!) 29   Ht 5\' 7"  (1.702 m)   Wt 136.1 kg   LMP 05/08/2013   SpO2 99%   BMI 46.99 kg/m  Exam: General: *** Eyes: *** ENTM: *** Neck: *** Cardiovascular: *** Respiratory: *** Gastrointestinal: *** MSK: *** Derm: *** Neuro: *** Psych: ***  Labs:  CBC BMET  Recent Labs  Lab 11/16/22 1627 11/16/22 1739 11/16/22 2018  WBC 10.0  --   --   HGB 9.8*   < > 11.2*  HCT 32.9*   < > 33.0*  PLT 215  --   --    < > = values in this interval not displayed.   Recent Labs  Lab 11/16/22 1627 11/16/22 1739 11/16/22 2018  NA 136 140  140 139  K 4.0 4.1  4.0 4.8  CL 105 105  --   CO2 22  --   --   BUN 22 23  --   CREATININE 1.41* 1.40*  --   GLUCOSE 116* 113*  --  CALCIUM 8.7*  --   --     Pertinent additional labs ***.  EKG: My own interpretation (not copied from electronic read) ***    Imaging Studies Performed:  Imaging Study (ie. Chest x-ray) Impression from Radiologist: ***   My Interpretation: ***   Lauren Spadafore Georg Ruddle, MD 11/17/2022, 12:01 AM PGY-***, [facility]  FPTS Intern pager: [phone], text pages welcome Secure chat group [facility] Teaching Service

## 2022-11-16 NOTE — Progress Notes (Signed)
Pt having EEG done at this time, will come back at a later time.

## 2022-11-16 NOTE — Hospital Course (Addendum)
Lauren Wood is a 62 y.o. female with PHMx of CHF, HTN, morbid obesity, intellectual disability presenting with dyspnea and tachypnea with lethargy and altered mental status after being found down in bathroom by sister.  Her hospital course is as follows:  Acute exacerbation of CHF Patient presented in respiratory distress with lethargy.  VBG did not show hypercarbia however patient had been on BiPAP for an hour when this was drawn.  Patient required BiPAP initially due to increased work of breathing and tachypnea.  Patient given multiple DuoNebs, and oxygen was eventually weaned to HFNC.  CXR showed pulmonary vascular congestion and echo showed 30 to 35% ejection fraction consistent with prior studies.  Patient was diuresed with Lasix and status improved.  By the time of discharge patient had diuresed over and was on 2L O2 and continuing to wean.  Altered mental status UDS negative. EEG without evidence of seizures. Patient was initially lethargic, but this improved throughout the course of hospitalization.  She became alert and oriented and able to communicate her situation.  She was aware that her sister was coming to see her and asked that we call her sister with updates.  Additionally, initially held patient's psych meds due to somnolence, however restarted these once patient began to return to baseline.  By the time of discharge patient had returned to baseline mental status.   Follow-up Recommendations:  1. Recheck BMP and Magnesium 2. Some of the patient's psych medications were changed due to concern for over-sedation. Please assess mental status and readjust medications as necessary in the outpatient setting.

## 2022-11-16 NOTE — Assessment & Plan Note (Addendum)
Last echo in 2021, showed EF of 35%- 40%. Patient required BiPAP in ED d/t increased WOB and tachyapnea.  - redose lasix 60 IV  - BiPAP overnight, reevaluate respiratory status in AM - duoneb x3 as patient had some wheezing on exam - Echo as last one was 2021 - strict I/Os

## 2022-11-16 NOTE — Progress Notes (Signed)
Pt placed on BIPAP as ordered, pt is tolerating well at this time.

## 2022-11-16 NOTE — Assessment & Plan Note (Addendum)
Patient presented after being found down in bathroom by sister with pink foaming at mouth. Patient remained lethargic in ED.  - neuro checks q4h - salicylate level  - ethanol - UDS  - EEG - TSH

## 2022-11-16 NOTE — Progress Notes (Signed)
RT NOTE:  Pt switched from BIPAP to 3L [place] per MD verbal request. Transition done without event. Pt tolerating well at this time.

## 2022-11-16 NOTE — Assessment & Plan Note (Signed)
Slight bump in Cr on admission  - AM BMP

## 2022-11-16 NOTE — ED Notes (Signed)
ED TO INPATIENT HANDOFF REPORT  ED Nurse Name and Phone #: Minerva Areola [phone]  S Name/Age/Gender Lauren Wood 62 y.o. female Room/Bed: 035C/035C  Code Status   Code Status: Prior  Home/SNF/Other Home Patient oriented to: self, place, time, and situation Is this baseline? Yes   Triage Complete: Triage complete  Chief Complaint AMS (altered mental status) [R41.82]  Triage Note PT BIB EMS from home, was found in the bathroom by family unresponsive on the toilet, frothy sputum around her mouth, was alert to voice with EMS.  Able to stand with assistance to stretcher, shallow respirations, productive cough, pink frothy sputum, 3L [place] at baseline.   CBHG 72 BP 124/86 HR 66 100 nonrebreather Resp 40 12 Lead LBB w PVC   Allergies Allergies  Allergen Reactions   Enalapril Maleate Other (See Comments)    Unknown reaction - per family   Vasotec    Hctz [Hydrochlorothiazide] Other (See Comments)    Hyperuricemia with Lasix    Lasix [Furosemide] Other (See Comments)    Hyperuricemia with HCTZ      Level of Care/Admitting Diagnosis ED Disposition     ED Disposition  Admit   Condition  --   Comment  Hospital [facility] [[id number]]  Level of Care: Progressive [102]  Admit to Progressive based on following criteria: NEUROLOGICAL AND NEUROSURGICAL complex patients with significant risk of instability, who do not meet ICU criteria, yet require close observation or frequent assessment (< / = every 2 - 4 hours) with medical / nursing intervention.  Admit to Progressive based on following criteria: RESPIRATORY PROBLEMS hypoxemic/hypercapnic respiratory failure that is responsive to NIPPV (BiPAP) or High Flow Nasal Cannula (6-80 lpm). Frequent assessment/intervention, no > Q2 hrs < Q4 hrs, to maintain oxygenation and pulmonary hygiene.  May place patient in observation at [facility] or [facility] if equivalent level of care is available:: No  Covid Evaluation:  Asymptomatic - no recent exposure (last 10 days) testing not required  Diagnosis: AMS (altered mental status) [9604540]  Admitting Physician: Penne Lash [facility] [[id number]]  Attending Physician: Carney Living [[id number]]          B Medical/Surgery History Past Medical History:  Diagnosis Date   ALLERGIC RHINITIS 08/02/2009   Arthritis    Boil of vulva 11/07/2012   Borderline personality disorder (HCC) 05/31/2011   CKD (chronic kidney disease) stage 3, GFR 30-59 ml/min (HCC)    chronic   Gout of foot 09/26/2013   L foot 09/2013. In the setting of HCTZ 25 mg and lasix 40 mg daily. Stopped HCTZ after decreasing to 12.5 mg. Treated with colchicine.     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      PRESSURE ULCER OTHER SITE 10/01/2009   hx of   Sleep apnea    uses cpap, setting of 2   Unspecified psychosis 12/20/2009   Past Surgical History:  Procedure Laterality Date   COLONOSCOPY N/A 06/24/2013   Procedure: COLONOSCOPY;  Surgeon: Iva Boop, MD;  Location: [facility] ENDOSCOPY;  Service: Endoscopy;  Laterality: N/A;   COLONOSCOPY WITH PROPOFOL N/A 10/05/2015   Procedure:  COLONOSCOPY WITH PROPOFOL;  Surgeon: Iva Boop, MD;  Location: [facility] ENDOSCOPY;  Service: Endoscopy;  Laterality: N/A;   COLONOSCOPY WITH PROPOFOL N/A 09/26/2021   Procedure: COLONOSCOPY WITH PROPOFOL;  Surgeon: Iva Boop, MD;  Location: [facility] ENDOSCOPY;  Service: Gastroenterology;  Laterality: N/A;   cyst removed  2011   forehead   TEE WITHOUT CARDIOVERSION N/A 03/19/2020   Procedure: TRANSESOPHAGEAL ECHOCARDIOGRAM (TEE);  Surgeon: Lewayne Bunting, MD;  Location: [facility] ENDOSCOPY;  Service: Cardiovascular;  Laterality: N/A;    TYMPANOSTOMY TUBE PLACEMENT Bilateral yrs ago     A IV Location/Drains/Wounds Patient Lines/Drains/Airways Status     Active Line/Drains/Airways     Name Placement date Placement time Site Days   Peripheral IV 11/16/22 18 G 1" Right Antecubital 11/16/22  --  Antecubital  less than 1   External Urinary Catheter 11/16/22  1651  --  less than 1            Intake/Output Last 24 hours No intake or output data in the 24 hours ending 11/16/22 2157  Labs/Imaging Results for orders placed or performed during the hospital encounter of 11/16/22 (from the past 48 hour(s))  Comprehensive metabolic panel     Status: Abnormal   Collection Time: 11/16/22  4:27 PM  Result Value Ref Range   Sodium 136 135 - 145 mmol/L   Potassium 4.0 3.5 - 5.1 mmol/L   Chloride 105 98 - 111 mmol/L   CO2 22 22 - 32 mmol/L   Glucose, Bld 116 (H) 70 - 99 mg/dL    Comment: Glucose reference range applies only to samples taken after fasting for at least 8 hours.   BUN 22 8 - 23 mg/dL   Creatinine, Ser 1.61 (H) 0.44 - 1.00 mg/dL   Calcium 8.7 (L) 8.9 - 10.3 mg/dL   Total Protein 6.7 6.5 - 8.1 g/dL   Albumin 3.2 (L) 3.5 - 5.0 g/dL   AST 37 15 - 41 U/L   ALT 22 0 - 44 U/L   Alkaline Phosphatase 147 (H) 38 - 126 U/L   Total Bilirubin 0.3 0.3 - 1.2 mg/dL   GFR, Estimated 42 (L) >60 mL/min    Comment: (NOTE) Calculated using the CKD-EPI Creatinine Equation (2021)    Anion gap 9 5 - 15    Comment: Performed at [facility], [address], [place], [place] [address]  CBC with Differential/Platelet     Status: Abnormal   Collection Time: 11/16/22  4:27 PM  Result Value Ref Range   WBC 10.0 4.0 - 10.5 K/uL   RBC 3.61 (L) 3.87 - 5.11 MIL/uL   Hemoglobin 9.8 (L) 12.0 - 15.0 g/dL   HCT 54.0 (L) 98.1 - 19.1 %   MCV 91.1 80.0 - 100.0 fL   MCH 27.1 26.0 - 34.0 pg   MCHC 29.8 (L) 30.0 - 36.0 g/dL   RDW 47.8 29.5 - 62.1 %   Platelets 215 150 - 400 K/uL   nRBC 0.0 0.0 - 0.2 %   Neutrophils Relative % 70 %    Neutro Abs 7.0 1.7 - 7.7 K/uL   Lymphocytes Relative 17 %   Lymphs Abs 1.7 0.7 - 4.0 K/uL   Monocytes Relative 9 %   Monocytes Absolute 0.9 0.1 - 1.0 K/uL   Eosinophils Relative 3 %   Eosinophils Absolute 0.3 0.0 - 0.5 K/uL   Basophils Relative 0 %   Basophils Absolute 0.0 0.0 - 0.1 K/uL   Immature Granulocytes 1 %  Abs Immature Granulocytes 0.07 0.00 - 0.07 K/uL    Comment: Performed at [facility], [address], [place], [place] [address]  Lactic acid, plasma     Status: None   Collection Time: 11/16/22  4:27 PM  Result Value Ref Range   Lactic Acid, Venous 1.6 0.5 - 1.9 mmol/L    Comment: Performed at [facility], [address] [place], [place] [address]  Brain natriuretic peptide     Status: Abnormal   Collection Time: 11/16/22  4:27 PM  Result Value Ref Range   B Natriuretic Peptide 570.6 (H) 0.0 - 100.0 pg/mL    Comment: Performed at [facility], [address] [place], [place] [address]  Troponin I (High Sensitivity)     Status: Abnormal   Collection Time: 11/16/22  4:27 PM  Result Value Ref Range   Troponin I (High Sensitivity) 20 (H) <18 ng/L    Comment: (NOTE) Elevated high sensitivity troponin I (hsTnI) values and significant  changes across serial measurements may suggest ACS but many other  chronic and acute conditions are known to elevate hsTnI results.  Refer to the "Links" section for chest pain algorithms and additional  guidance. Performed at [facility], [address], [place], [place] [address]   POC CBG, ED     Status: None   Collection Time: 11/16/22  4:33 PM  Result Value Ref Range   Glucose-Capillary 89 70 - 99 mg/dL    Comment: Glucose reference range applies only to samples taken after fasting for at least 8 hours.  CBG monitoring, ED     Status: Abnormal   Collection Time: 11/16/22  5:28 PM  Result Value Ref Range   Glucose-Capillary 107 (H) 70 - 99 mg/dL    Comment: Glucose reference range applies only to  samples taken after fasting for at least 8 hours.   Comment 1 Notify RN    Comment 2 Document in Chart   I-stat chem 8, ED (not at [facility], [facility] or [facility])     Status: Abnormal   Collection Time: 11/16/22  5:39 PM  Result Value Ref Range   Sodium 140 135 - 145 mmol/L   Potassium 4.0 3.5 - 5.1 mmol/L   Chloride 105 98 - 111 mmol/L   BUN 23 8 - 23 mg/dL   Creatinine, Ser 8.65 (H) 0.44 - 1.00 mg/dL   Glucose, Bld 784 (H) 70 - 99 mg/dL    Comment: Glucose reference range applies only to samples taken after fasting for at least 8 hours.   Calcium, Ion 1.20 1.15 - 1.40 mmol/L   TCO2 23 22 - 32 mmol/L   Hemoglobin 11.2 (L) 12.0 - 15.0 g/dL   HCT 69.6 (L) 29.5 - 28.4 %  I-Stat venous blood gas, ([facility] ED, [facility], [facility])     Status: Abnormal   Collection Time: 11/16/22  5:39 PM  Result Value Ref Range   pH, Ven 7.294 7.25 - 7.43   pCO2, Ven 51.3 44 - 60 mmHg   pO2, Ven 130 (H) 32 - 45 mmHg   Bicarbonate 24.9 20.0 - 28.0 mmol/L   TCO2 26 22 - 32 mmol/L   O2 Saturation 99 %   Acid-base deficit 2.0 0.0 - 2.0 mmol/L   Sodium 140 135 - 145 mmol/L   Potassium 4.1 3.5 - 5.1 mmol/L   Calcium, Ion 1.20 1.15 - 1.40 mmol/L   HCT 32.0 (L) 36.0 - 46.0 %   Hemoglobin  10.9 (L) 12.0 - 15.0 g/dL   Sample type VENOUS   Troponin I (High Sensitivity)     Status: Abnormal   Collection Time: 11/16/22  8:15 PM  Result Value Ref Range   Troponin I (High Sensitivity) 24 (H) <18 ng/L    Comment: (NOTE) Elevated high sensitivity troponin I (hsTnI) values and significant  changes across serial measurements may suggest ACS but many other  chronic and acute conditions are known to elevate hsTnI results.  Refer to the "Links" section for chest pain algorithms and additional  guidance. Performed at [facility], [address] [place], [place] [address]   I-Stat venous blood gas, [facility] ED, [facility], [facility])     Status: Abnormal   Collection Time: 11/16/22  8:18 PM  Result Value Ref Range   pH, Ven 7.321 7.25 - 7.43   pCO2,  Ven 50.7 44 - 60 mmHg   pO2, Ven 65 (H) 32 - 45 mmHg   Bicarbonate 26.2 20.0 - 28.0 mmol/L   TCO2 28 22 - 32 mmol/L   O2 Saturation 90 %   Acid-Base Excess 0.0 0.0 - 2.0 mmol/L   Sodium 139 135 - 145 mmol/L   Potassium 4.8 3.5 - 5.1 mmol/L   Calcium, Ion 1.18 1.15 - 1.40 mmol/L   HCT 33.0 (L) 36.0 - 46.0 %   Hemoglobin 11.2 (L) 12.0 - 15.0 g/dL   Sample type VENOUS    DG Chest Portable 1 View  Result Date: 11/16/2022 CLINICAL DATA:  Respiratory distress. EXAM: PORTABLE CHEST 1 VIEW COMPARISON:  03/19/2020 FINDINGS: Low volume film. The cardio pericardial silhouette is enlarged. There is pulmonary vascular congestion without overt pulmonary edema. No acute bony abnormality. Telemetry leads overlie the chest. IMPRESSION: Low volume film with cardiomegaly and pulmonary vascular congestion. Electronically Signed   By: Kennith Center M.D.   On: 11/16/2022 17:19    Pending Labs Unresulted Labs (From admission, onward)     Start     Ordered   11/16/22 1627  Lactic acid, plasma  Now then every 2 hours,   R (with STAT occurrences)      11/16/22 1627            Vitals/Pain Today's Vitals   11/16/22 1928 11/16/22 2000 11/16/22 2012 11/16/22 2115  BP:  126/82  124/83  Pulse:  69  61  Resp:  (!) 30  (!) 29  Temp:   97.9 F (36.6 C)   TempSrc:   Oral   SpO2:  99%  99%  Weight:      Height:      PainSc: 0-No pain       Isolation Precautions No active isolations  Medications Medications  ondansetron (ZOFRAN) injection 4 mg (4 mg Intravenous Given 11/16/22 1632)  furosemide (LASIX) injection 60 mg (60 mg Intravenous Given 11/16/22 1653)    Mobility walks with device     Focused Assessments Pulmonary Assessment Handoff:  Lung sounds: Bilateral Breath Sounds: Diminished O2 Device: Nasal Cannula O2 Flow Rate (L/min): 3 L/min    R Recommendations: See Admitting Provider Note  Report given to:   Additional Notes: cpap down to 3l o2 hard of hearing

## 2022-11-16 NOTE — Progress Notes (Signed)
Full H&P to follow, but briefly:  62 y/o female w/ multiple underlying co-morbidities (obesity hypoventilation syndrome, psychiatric conditions, intellectual delay) who presented from home with AMS and acute respiratory failure with likely underlying CHF exacerbation. Seen in ED for admission, still quite tachypneic on 3L . Sleepy on exam, but will awaken when speaking loudly.  Patient is stable for Progressive at this time. I discussed my plan of care with ICU (in the event she declines or does not improve with diuresis/NIVV who agrees with the following: - Place BiPAP; keep overnight due to respiratory distress/tachypnea - Hold all sedating medications (Klonipin, Trazodone, etc) - Place Foley with strict I/O - Re-dose Lasix   Darral Dash, DO PGY-3 [facility]

## 2022-11-16 NOTE — ED Provider Notes (Signed)
[place] EMERGENCY DEPARTMENT AT [facility] Provider Note   CSN: [id number] Arrival date & time: 11/16/22  1617     History  Chief Complaint  Patient presents with   Fatigue    Lethargy, Respiratory Distress    Lauren Wood is a 62 y.o. 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 History obtained from EMS, as patient w/ resp distress/lethargy.  PT BIB EMS from home, was found in the bathroom by family unresponsive on the toilet, frothy sputum around her mouth, was alert to voice with EMS. Able to stand with assistance to stretcher, shallow respirations, productive cough, pink frothy sputum, 3L [place] at baseline. Was satting 89% on home O2, now 100% on NRB. RR 30s-40s, shallow breaths, decreased lung sounds bilaterally. Pt is responsive to voice and answers some questions but falls immediately back to sleep. Level 5 caveat for AMS.  Per chart review patient was seen by her cardiologist on 11/08/2022 and reported dyspnea on exertion on her home oxygen.  She denied any chest pressure at that time.  HPI     Home Medications Prior to Admission medications   Medication Sig Start Date End Date Taking? Authorizing Provider  acetaminophen (TYLENOL) 500 MG tablet Take 500 mg by mouth every 6 (six) hours as needed for headache.     [provider]  allopurinol (ZYLOPRIM) 100 MG tablet TAKE ONE TABLET BY MOUTH ONCE DAILY 05/29/22   Maury Dus, MD  aspirin EC 81 MG tablet Take 1 tablet (81 mg total) by mouth daily. Swallow whole. 08/21/22   Maury Dus, MD  benzonatate (TESSALON PERLES) 100 MG capsule Take 2 capsules (200 mg total) by mouth 3 (three) times daily as needed for cough. 05/10/21   Sabino Dick, DO  benztropine (COGENTIN) 0.5  MG tablet Take 0.5 mg by mouth 2 (two) times daily.    [provider]  buPROPion (WELLBUTRIN XL) 150 MG 24 hr tablet Take 150 mg by mouth every morning.    [provider]  clonazePAM (KLONOPIN) 0.5 MG tablet Take 0.5 tablets (0.25 mg total) by mouth daily as needed for anxiety. Patient taking differently: Take 0.25 mg by mouth 2 (two) times daily. 07/13/20   Cathleen Corti, MD  colchicine 0.6 MG tablet TAKE ONE-HALF TABLET EVERY OTHER DAY FORGOUT FOR 3 MONTHS Patient taking differently: Take 0.3 mg by mouth daily as needed (Gout). 08/05/20   Cathleen Corti, MD  docusate sodium (COLACE) 100 MG capsule Take 1 capsule (100 mg total) by mouth 2 (two) times daily. 09/28/22   Maury Dus, MD  FLUoxetine (PROZAC) 40 MG capsule Take 1 capsule (40 mg total) by mouth every morning. 07/13/20   Cathleen Corti, MD  fluticasone (FLONASE) 50 MCG/ACT nasal spray Place 2 sprays into both nostrils daily. 05/10/21   Sabino Dick, DO  furosemide (LASIX) 80 MG tablet TAKE ONE TABLET DAILY 11/09/22   Maury Dus, MD  losartan (COZAAR) 50 MG tablet Take 1 tablet (50 mg total) by mouth daily. 05/19/21   Rollene Rotunda, MD  metoprolol tartrate (LOPRESSOR) 100 MG tablet TAKE ONE TABLET TWICE DAILY 09/26/22   Maury Dus, MD  mirtazapine (REMERON) 30 MG tablet Take 1 tablet (30 mg total) by mouth at bedtime. 07/13/20   Cathleen Corti, MD  omeprazole (PRILOSEC) 20  MG capsule TAKE ONE CAPSULE DAILY 09/26/22   Maury Dus, MD  prazosin (MINIPRESS) 1 MG capsule Take 1 mg by mouth at bedtime. 10/04/22   [provider]  pregabalin (LYRICA) 50 MG capsule Take 1 capsule (50 mg total) by mouth daily. 07/13/20   Cathleen Corti, MD  risperiDONE (RISPERDAL) 3 MG tablet Take 3 mg by mouth 3 (three) times daily. 11/25/20   [provider]  Semaglutide,0.25 or 0.5MG /DOS, 2 MG/1.5ML SOPN Inject 0.5 mg into the skin once a week. 09/22/22   Maury Dus, MD   spironolactone (ALDACTONE) 25 MG tablet TAKE ONE TABLET BY MOUTH DAILY 08/21/22   Maury Dus, MD  traZODone (DESYREL) 100 MG tablet Take 100 mg by mouth at bedtime.    [provider]      Allergies    Enalapril maleate, Hctz [hydrochlorothiazide], and Lasix [furosemide]    Review of Systems   Review of Systems A 10 point review of systems was performed and is negative unless otherwise reported in HPI.  Physical Exam Updated Vital Signs BP 124/83   Pulse 61   Temp 97.9 F (36.6 C) (Oral)   Resp (!) 29   Ht 5\' 7"  (1.702 m)   Wt 136.1 kg   LMP 05/08/2013   SpO2 99%   BMI 46.99 kg/m  Physical Exam General: Normal appearing female, lying in bed.  HEENT: PERRLA, Sclera anicteric, MMM, trachea midline.  Cardiology: RRR, no murmurs/rubs/gallops. BL radial and DP pulses equal bilaterally.  Resp: Normal respiratory rate and effort. CTAB, no wheezes, rhonchi, crackles.  Abd: Soft, non-tender, non-distended. No rebound tenderness or guarding.  GU: Deferred. MSK: No peripheral edema or signs of trauma. Extremities without deformity or TTP. No cyanosis or clubbing. Skin: warm, dry. No rashes or lesions. Back: No CVA tenderness Neuro: A&Ox4, CNs II-XII grossly intact. MAEs. Sensation grossly intact.  Psych: Normal mood and affect.   ED Results / Procedures / Treatments   Labs (all labs ordered are listed, but only abnormal results are displayed) Labs Reviewed  COMPREHENSIVE METABOLIC PANEL - Abnormal; Notable for the following components:      Result Value   Glucose, Bld 116 (*)    Creatinine, Ser 1.41 (*)    Calcium 8.7 (*)    Albumin 3.2 (*)    Alkaline Phosphatase 147 (*)    GFR, Estimated 42 (*)    All other components within normal limits  CBC WITH DIFFERENTIAL/PLATELET - Abnormal; Notable for the following components:   RBC 3.61 (*)    Hemoglobin 9.8 (*)    HCT 32.9 (*)    MCHC 29.8 (*)    All other components within normal limits  BRAIN NATRIURETIC  PEPTIDE - Abnormal; Notable for the following components:   B Natriuretic Peptide 570.6 (*)    All other components within normal limits  CBG MONITORING, ED - Abnormal; Notable for the following components:   Glucose-Capillary 107 (*)    All other components within normal limits  I-STAT CHEM 8, ED - Abnormal; Notable for the following components:   Creatinine, Ser 1.40 (*)    Glucose, Bld 113 (*)    Hemoglobin 11.2 (*)    HCT 33.0 (*)    All other components within normal limits  I-STAT VENOUS BLOOD GAS, ED - Abnormal; Notable for the following components:   pO2, Ven 130 (*)    HCT 32.0 (*)    Hemoglobin 10.9 (*)    All other components within normal limits  I-STAT  VENOUS BLOOD GAS, ED - Abnormal; Notable for the following components:   pO2, Ven 65 (*)    HCT 33.0 (*)    Hemoglobin 11.2 (*)    All other components within normal limits  TROPONIN I (HIGH SENSITIVITY) - Abnormal; Notable for the following components:   Troponin I (High Sensitivity) 20 (*)    All other components within normal limits  TROPONIN I (HIGH SENSITIVITY) - Abnormal; Notable for the following components:   Troponin I (High Sensitivity) 24 (*)    All other components within normal limits  LACTIC ACID, PLASMA  LACTIC ACID, PLASMA  CBG MONITORING, ED    EKG EKG Interpretation Date/Time:  Thursday November 16 2022 16:35:42 EDT Ventricular Rate:  61 PR Interval:  201 QRS Duration:  169 QT Interval:  464 QTC Calculation: 468 R Axis:   -51  Text Interpretation: Sinus rhythm Multiple premature complexes, vent & supraven Left bundle branch block Similar to prior Confirmed by Vivi Barrack [phone]) on 11/16/2022 9:37:06 PM  Radiology DG Chest Portable 1 View  Result Date: 11/16/2022 CLINICAL DATA:  Respiratory distress. EXAM: PORTABLE CHEST 1 VIEW COMPARISON:  03/19/2020 FINDINGS: Low volume film. The cardio pericardial silhouette is enlarged. There is pulmonary vascular congestion without overt pulmonary edema. No  acute bony abnormality. Telemetry leads overlie the chest. IMPRESSION: Low volume film with cardiomegaly and pulmonary vascular congestion. Electronically Signed   By: Kennith Center M.D.   On: 11/16/2022 17:19    Procedures .Critical Care  Performed by: Loetta Rough, MD Authorized by: Loetta Rough, MD   Critical care provider statement:    Critical care time (minutes):  40   Critical care was necessary to treat or prevent imminent or life-threatening deterioration of the following conditions:  Respiratory failure   Critical care was time spent personally by me on the following activities:  Development of treatment plan with patient or surrogate, discussions with consultants, evaluation of patient's response to treatment, examination of patient, ordering and review of laboratory studies, ordering and review of radiographic studies, ordering and performing treatments and interventions, pulse oximetry, re-evaluation of patient's condition, review of old charts, obtaining history from patient or surrogate and ventilator management   Care discussed with: admitting provider       Medications Ordered in ED Medications  ondansetron (ZOFRAN) injection 4 mg (4 mg Intravenous Given 11/16/22 1632)  furosemide (LASIX) injection 60 mg (60 mg Intravenous Given 11/16/22 1653)    ED Course/ Medical Decision Making/ A&P                          Medical Decision Making Amount and/or Complexity of Data Reviewed Labs: ordered. Decision-making details documented in ED Course. Radiology: ordered. Decision-making details documented in ED Course.  Risk Prescription drug management. Decision regarding hospitalization.    This patient presents to the ED for concern of SOB, lethargy, this involves an extensive number of treatment options, and is a complaint that carries with it a high risk of complications and morbidity.  I considered the following differential and admission for this acute, potentially life  threatening condition.   MDM:    Ddx of acute altered mental status or encephalopathy/respiratory distress considered but not limited to: -Most likely hypercarbia given patient's lethargy, known history of obesity hypoventilation and diastolic/systolic heart failure.  Will obtain VBG and immediately start patient on BiPAP.  She is given Zofran as a pretreatment to the BiPAP.  Will recheck a VBG  and if patient's respiratory status and labs not improved she would likely be intubated.  -For her respiratory distress consider heart failure exacerbation given decreased lung sounds bilaterally and pink frothy sputum reported.  Also consider diffuse alveolar hemorrhage, ARDS, pneumonia, aspiration pneumonitis/pneumonia.  Will treat with Lasix and nitroglycerin if patient's blood pressure will sustain it. No wheezing to suggest COPD. No PTX on CXR.   -Intracranial abnormalities such as ICH, hydrocephalus, head trauma -Infection such as UTI, PNA -Toxic ingestion such as opioid overdose, anticholinergic toxicity -Electrolyte abnormalities or hyper/hypoglycemia -Hypercarbia or hypoxia -Hepatic encephalopathy or uremia -ACS or arrhythmia -Endocrine abnormality such as thyroid storm or myxedema coma   Clinical Course as of 11/16/22 2138  Thu Nov 16, 2022  1639 Glucose-Capillary: 89 [HN]  1741 pCO2, Ven: 51.3 Was not drawn when ordered. Instead, drawn by mistake one hour after patient's arrival to ED, nearly one hour after initiation of BiPAP [HN]  1742 DG Chest Portable 1 View Low volume film with cardiomegaly and pulmonary vascular congestion. [HN]  1801 B Natriuretic Peptide(!): 570.6 Elevated [HN]  1914 Patient reevaluated.  She is clinically improved significantly on the BiPAP and has already started urinating from the Lasix.  She is feels much less short of breath and is more awake now.  Unknown what her pCO2 was prior to initiation of BiPAP however I anticipate she was hypercarbic.  Will trial off  of BiPAP. [HN]  2027 I-Stat venous blood gas, ([facility] ED, [facility], [facility])(!) Stable off of BIPAP, now on 3L [place]. States her breathing has improved. Pt will need further diuresis/management. Will admit to medicine. [HN]    Clinical Course User Index [HN] Loetta Rough, MD    Labs: I Ordered, and personally interpreted labs.  The pertinent results include:  those litsed above  Imaging Studies ordered: I ordered imaging studies including CXR I independently visualized and interpreted imaging. I agree with the radiologist interpretation  Additional history obtained from chart review, family at bedside.    Cardiac Monitoring: The patient was maintained on a cardiac monitor.  I personally viewed and interpreted the cardiac monitored which showed an underlying rhythm of: NSR  Reevaluation: After the interventions noted above, I reevaluated the patient and found that they have :improved  Disposition:  Admit on 3L   Co morbidities that complicate the patient evaluation  Past Medical History:  Diagnosis Date   ALLERGIC RHINITIS 08/02/2009   Arthritis    Boil of vulva 11/07/2012   Borderline personality disorder (HCC) 05/31/2011   CKD (chronic kidney disease) stage 3, GFR 30-59 ml/min (HCC)    chronic   Gout of foot 09/26/2013   L foot 09/2013. In the setting of HCTZ 25 mg and lasix 40 mg daily. Stopped HCTZ after decreasing to 12.5 mg. Treated with colchicine.     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      PRESSURE ULCER OTHER SITE 10/01/2009   hx of   Sleep apnea    uses cpap, setting of 2   Unspecified psychosis 12/20/2009     Medicines Meds ordered this encounter  Medications   ondansetron  (ZOFRAN) injection  4 mg   furosemide (LASIX) injection 60 mg    I have reviewed the patients home medicines and have made adjustments as needed  Problem List / ED Course: Problem List Items Addressed This Visit       Cardiovascular and Mediastinum   CHF (congestive heart failure) (HCC) - Primary                This note was created using dictation software, which may contain spelling or grammatical errors.    Loetta Rough, MD 11/16/22 2138

## 2022-11-17 ENCOUNTER — Telehealth: Payer: Self-pay | Admitting: Cardiology

## 2022-11-17 ENCOUNTER — Inpatient Hospital Stay (HOSPITAL_COMMUNITY): Payer: Medicare (Managed Care)

## 2022-11-17 ENCOUNTER — Other Ambulatory Visit (HOSPITAL_COMMUNITY): Payer: Self-pay

## 2022-11-17 DIAGNOSIS — R569 Unspecified convulsions: Secondary | ICD-10-CM

## 2022-11-17 DIAGNOSIS — I5043 Acute on chronic combined systolic (congestive) and diastolic (congestive) heart failure: Secondary | ICD-10-CM

## 2022-11-17 DIAGNOSIS — I5023 Acute on chronic systolic (congestive) heart failure: Secondary | ICD-10-CM

## 2022-11-17 DIAGNOSIS — I5021 Acute systolic (congestive) heart failure: Secondary | ICD-10-CM

## 2022-11-17 DIAGNOSIS — R4182 Altered mental status, unspecified: Secondary | ICD-10-CM

## 2022-11-17 LAB — ECHOCARDIOGRAM COMPLETE
AR max vel: 1.5 cm2
AV Area VTI: 1.48 cm2
AV Area mean vel: 1.45 cm2
AV Mean grad: 7 mmHg
AV Peak grad: 12.7 mmHg
Ao pk vel: 1.78 m/s
Area-P 1/2: 3.65 cm2
Calc EF: 34.6 %
Height: 67 in
MV M vel: 5.24 m/s
MV Peak grad: 109.8 mmHg
S' Lateral: 5 cm
Single Plane A2C EF: 34.9 %
Single Plane A4C EF: 30.7 %
Weight: 5266.35 oz

## 2022-11-17 LAB — BASIC METABOLIC PANEL
Anion gap: 13 (ref 5–15)
Anion gap: 10 (ref 5–15)
BUN: 21 mg/dL (ref 8–23)
BUN: 19 mg/dL (ref 8–23)
CO2: 24 mmol/L (ref 22–32)
CO2: 28 mmol/L (ref 22–32)
Calcium: 9 mg/dL (ref 8.9–10.3)
Calcium: 9 mg/dL (ref 8.9–10.3)
Chloride: 103 mmol/L (ref 98–111)
Chloride: 101 mmol/L (ref 98–111)
Creatinine, Ser: 1.37 mg/dL — ABNORMAL HIGH (ref 0.44–1.00)
Creatinine, Ser: 1.34 mg/dL — ABNORMAL HIGH (ref 0.44–1.00)
GFR, Estimated: 44 mL/min — ABNORMAL LOW (ref >60)
GFR, Estimated: 45 mL/min — ABNORMAL LOW (ref >60)
Glucose, Bld: 84 mg/dL (ref 70–99)
Glucose, Bld: 81 mg/dL (ref 70–99)
Potassium: 4.5 mmol/L (ref 3.5–5.1)
Potassium: 5.1 mmol/L (ref 3.5–5.1)
Sodium: 140 mmol/L (ref 135–145)
Sodium: 139 mmol/L (ref 135–145)

## 2022-11-17 LAB — URINALYSIS, ROUTINE W REFLEX MICROSCOPIC
Bilirubin Urine: NEGATIVE
Glucose, UA: NEGATIVE mg/dL
Hgb urine dipstick: NEGATIVE
Ketones, ur: NEGATIVE mg/dL
Leukocytes,Ua: NEGATIVE
Nitrite: POSITIVE — AB
Protein, ur: NEGATIVE mg/dL
Specific Gravity, Urine: 1.01 (ref 1.005–1.030)
pH: 6 (ref 5.0–8.0)

## 2022-11-17 LAB — RAPID URINE DRUG SCREEN, HOSP PERFORMED
Amphetamines: NOT DETECTED
Barbiturates: NOT DETECTED
Benzodiazepines: NOT DETECTED
Cocaine: NOT DETECTED
Opiates: NOT DETECTED
Tetrahydrocannabinol: NOT DETECTED

## 2022-11-17 LAB — TSH: TSH: 5.266 u[IU]/mL — ABNORMAL HIGH (ref 0.350–4.500)

## 2022-11-17 LAB — GLUCOSE, CAPILLARY
Glucose-Capillary: 82 mg/dL (ref 70–99)
Glucose-Capillary: 68 mg/dL — ABNORMAL LOW (ref 70–99)

## 2022-11-17 LAB — HIV ANTIBODY (ROUTINE TESTING W REFLEX): HIV Screen 4th Generation wRfx: NONREACTIVE

## 2022-11-17 LAB — URINALYSIS, MICROSCOPIC (REFLEX)

## 2022-11-17 LAB — SALICYLATE LEVEL: Salicylate Lvl: <7 mg/dL — ABNORMAL LOW (ref 7.0–30.0)

## 2022-11-17 LAB — MAGNESIUM: Magnesium: 2 mg/dL (ref 1.7–2.4)

## 2022-11-17 LAB — ETHANOL: Alcohol, Ethyl (B): <10 mg/dL (ref <10)

## 2022-11-17 MED ORDER — TRAZODONE HCL 50 MG PO TABS
50.0000 mg | ORAL_TABLET | Freq: Every day | ORAL | 0 refills | Status: DC
  Filled 2022-11-17: qty 30, 30d supply, fill #0

## 2022-11-17 MED ORDER — ALLOPURINOL 100 MG PO TABS
100.0000 mg | ORAL_TABLET | Freq: Every day | ORAL | Status: DC
  Administered 2022-11-17 – 2022-11-18 (×2): 100 mg via ORAL
  Filled 2022-11-17 (×2): qty 1

## 2022-11-17 MED ORDER — PRAZOSIN HCL 1 MG PO CAPS
1.0000 mg | ORAL_CAPSULE | Freq: Every day | ORAL | Status: DC
  Administered 2022-11-17: 1 mg via ORAL
  Filled 2022-11-17: qty 1

## 2022-11-17 MED ORDER — TRAZODONE HCL 50 MG PO TABS
50.0000 mg | ORAL_TABLET | Freq: Every day | ORAL | Status: DC
  Administered 2022-11-17: 50 mg via ORAL
  Filled 2022-11-17: qty 1

## 2022-11-17 MED ORDER — MIRTAZAPINE 15 MG PO TABS
15.0000 mg | ORAL_TABLET | Freq: Every day | ORAL | Status: DC
  Administered 2022-11-17: 15 mg via ORAL
  Filled 2022-11-17: qty 1

## 2022-11-17 MED ORDER — SODIUM CHLORIDE 0.9 % IV SOLN
1.0000 g | Freq: Once | INTRAVENOUS | Status: AC
  Administered 2022-11-17: 1 g via INTRAVENOUS
  Filled 2022-11-17: qty 10

## 2022-11-17 MED ORDER — IPRATROPIUM-ALBUTEROL 0.5-2.5 (3) MG/3ML IN SOLN
3.0000 mL | Freq: Three times a day (TID) | RESPIRATORY_TRACT | Status: DC
  Administered 2022-11-17 – 2022-11-18 (×4): 3 mL via RESPIRATORY_TRACT
  Filled 2022-11-17 (×4): qty 3

## 2022-11-17 MED ORDER — FLUOXETINE HCL 20 MG PO CAPS
40.0000 mg | ORAL_CAPSULE | Freq: Every morning | ORAL | Status: DC
  Administered 2022-11-17 – 2022-11-18 (×2): 40 mg via ORAL
  Filled 2022-11-17 (×2): qty 2

## 2022-11-17 MED ORDER — IPRATROPIUM-ALBUTEROL 0.5-2.5 (3) MG/3ML IN SOLN
3.0000 mL | RESPIRATORY_TRACT | Status: DC
  Administered 2022-11-17: 3 mL via RESPIRATORY_TRACT

## 2022-11-17 MED ORDER — SODIUM CHLORIDE 0.9 % IV SOLN: 1.0000 g | INTRAVENOUS | Status: DC

## 2022-11-17 MED ORDER — METOPROLOL TARTRATE 50 MG PO TABS
50.0000 mg | ORAL_TABLET | Freq: Two times a day (BID) | ORAL | 0 refills | Status: DC
  Filled 2022-11-17: qty 60, 30d supply, fill #0

## 2022-11-17 MED ORDER — MIRTAZAPINE 15 MG PO TABS
15.0000 mg | ORAL_TABLET | Freq: Every day | ORAL | 0 refills | Status: DC
  Filled 2022-11-17: qty 30, 30d supply, fill #0

## 2022-11-17 MED ORDER — BUPROPION HCL ER (XL) 150 MG PO TB24
150.0000 mg | ORAL_TABLET | Freq: Every morning | ORAL | Status: DC
  Administered 2022-11-17 – 2022-11-18 (×2): 150 mg via ORAL
  Filled 2022-11-17 (×2): qty 1

## 2022-11-17 MED ORDER — SPIRONOLACTONE 25 MG PO TABS
25.0000 mg | ORAL_TABLET | Freq: Every day | ORAL | Status: DC
  Administered 2022-11-17 – 2022-11-18 (×2): 25 mg via ORAL
  Filled 2022-11-17 (×2): qty 1

## 2022-11-17 MED ORDER — METOPROLOL TARTRATE 50 MG PO TABS
50.0000 mg | ORAL_TABLET | Freq: Two times a day (BID) | ORAL | Status: DC
  Administered 2022-11-17 – 2022-11-18 (×3): 50 mg via ORAL
  Filled 2022-11-17 (×3): qty 1

## 2022-11-17 MED ORDER — RISPERIDONE 3 MG PO TABS
3.0000 mg | ORAL_TABLET | Freq: Three times a day (TID) | ORAL | Status: DC
  Administered 2022-11-17 – 2022-11-18 (×4): 3 mg via ORAL
  Filled 2022-11-17 (×4): qty 1

## 2022-11-17 MED ORDER — CLONAZEPAM 0.125 MG PO TBDP
0.2500 mg | ORAL_TABLET | Freq: Two times a day (BID) | ORAL | Status: DC
  Administered 2022-11-17 – 2022-11-18 (×3): 0.25 mg via ORAL
  Filled 2022-11-17 (×3): qty 2

## 2022-11-17 MED ORDER — PERFLUTREN LIPID MICROSPHERE
1.0000 mL | INTRAVENOUS | Status: AC | PRN
  Administered 2022-11-17: 5 mL via INTRAVENOUS
  Filled 2022-11-17: qty 10

## 2022-11-17 MED ORDER — LOSARTAN POTASSIUM 50 MG PO TABS
50.0000 mg | ORAL_TABLET | Freq: Every day | ORAL | Status: DC
  Administered 2022-11-17 – 2022-11-18 (×2): 50 mg via ORAL
  Filled 2022-11-17 (×2): qty 1

## 2022-11-17 MED ORDER — BENZTROPINE MESYLATE 0.5 MG PO TABS
0.5000 mg | ORAL_TABLET | Freq: Two times a day (BID) | ORAL | Status: DC
  Administered 2022-11-17 – 2022-11-18 (×3): 0.5 mg via ORAL
  Filled 2022-11-17 (×3): qty 1

## 2022-11-17 MED ORDER — FUROSEMIDE 10 MG/ML IJ SOLN
60.0000 mg | Freq: Once | INTRAMUSCULAR | Status: AC
  Administered 2022-11-17: 60 mg via INTRAVENOUS
  Filled 2022-11-17: qty 6

## 2022-11-17 NOTE — Evaluation (Signed)
Physical Therapy Evaluation Patient Details Name: Lauren Wood MRN: [id number] DOB: [birth year] Today's Date: 11/17/2022  History of Present Illness  62 y.o. female adm 7/4 presenting with dyspnea and tachypnea with lethargy and altered mental status after being found down in bathroom by sister. PHMx of CHF, HTN, morbid obesity, intellectual disability  Clinical Impression  Pt admitted with above diagnosis. Pt able to step pivot to recliner with min guard assist and cues. Pt had assist PTA and should be able to return home close to baseline.  HAs equipment and HHPT would be beneficial.  Pt currently with functional limitations due to the deficits listed below (see PT Problem List). Pt will benefit from acute skilled PT to increase their independence and safety with mobility to allow discharge.           Assistance Recommended at Discharge Intermittent Supervision/Assistance  If plan is discharge home, recommend the following:  Can travel by private vehicle  A little help with walking and/or transfers;A little help with bathing/dressing/bathroom;Assistance with cooking/housework;Assist for transportation;Help with stairs or ramp for entrance        Equipment Recommendations None recommended by PT  Recommendations for Other Services       Functional Status Assessment Patient has had a recent decline in their functional status and demonstrates the ability to make significant improvements in function in a reasonable and predictable amount of time.     Precautions / Restrictions Precautions Precautions: Fall Precaution Comments: O2 at baseline Restrictions Weight Bearing Restrictions: No      Mobility  Bed Mobility Overal bed mobility: Needs Assistance Bed Mobility: Supine to Sit       Sit to supine: Supervision   General bed mobility comments: incr time but pt can come to eOB without assist    Transfers Overall transfer level: Needs assistance   Transfers: Sit to/from  Stand, Bed to chair/wheelchair/BSC Sit to Stand: Min guard   Step pivot transfers: Min guard       General transfer comment: Needed min guard assist to stand and pivot to chair with pt needing PT cues and assist to reach for armrests of chair. Needed some assist for safety.  Pt reports she only felt up to transfer to chair.    Ambulation/Gait                  Stairs            Wheelchair Mobility     Tilt Bed    Modified Rankin (Stroke Patients Only)       Balance Overall balance assessment: Needs assistance Sitting-balance support: Feet supported Sitting balance-Leahy Scale: Good     Standing balance support: Reliant on assistive device for balance Standing balance-Leahy Scale: Poor Standing balance comment: relies on support for balance with standing                             Pertinent Vitals/Pain Pain Assessment Pain Assessment: No/denies pain Pain Intervention(s): Limited activity within patient's tolerance, Monitored during session    Home Living Family/patient expects to be discharged to:: Private residence Living Arrangements: Other relatives (sister) Available Help at Discharge: Family;Available 24 hours/day Type of Home: House Home Access: Level entry       Home Layout: Two level;Able to live on main level with bedroom/bathroom Home Equipment: Rolling Walker (2 wheels);Shower seat      Prior Function Prior Level of Function : Needs assist  Mobility Comments: Used RW ADLs Comments: Patient continues to participate with her own self care, and can help with light meal prep, but sister cares for all her needs including community mobility.     Hand Dominance   Dominant Hand: Right    Extremity/Trunk Assessment   Upper Extremity Assessment Upper Extremity Assessment: Defer to OT evaluation    Lower Extremity Assessment Lower Extremity Assessment: Generalized weakness    Cervical / Trunk  Assessment Cervical / Trunk Assessment: Other exceptions Cervical / Trunk Exceptions: body habitus  Communication   Communication: HOH  Cognition Arousal/Alertness: Awake/alert Behavior During Therapy: WFL for tasks assessed/performed Overall Cognitive Status: History of cognitive impairments - at baseline                                          General Comments General comments (skin integrity, edema, etc.): VSS with pt on 2LO2 and sats 98%.  124/75    Exercises     Assessment/Plan    PT Assessment Patient needs continued PT services  PT Problem List Decreased balance;Decreased mobility;Decreased knowledge of use of DME;Decreased safety awareness;Decreased knowledge of precautions;Obesity;Decreased activity tolerance       PT Treatment Interventions DME instruction;Gait training;Functional mobility training;Therapeutic activities;Therapeutic exercise;Balance training;Patient/family education    PT Goals (Current goals can be found in the Care Plan section)  Acute Rehab PT Goals Patient Stated Goal: to go home PT Goal Formulation: With patient Time For Goal Achievement: 12/01/22 Potential to Achieve Goals: Good    Frequency Min 1X/week     Co-evaluation               AM-PAC PT "6 Clicks" Mobility  Outcome Measure Help needed turning from your back to your side while in a flat bed without using bedrails?: None Help needed moving from lying on your back to sitting on the side of a flat bed without using bedrails?: A Little Help needed moving to and from a bed to a chair (including a wheelchair)?: A Little Help needed standing up from a chair using your arms (e.g., wheelchair or bedside chair)?: A Little Help needed to walk in hospital room?: A Lot Help needed climbing 3-5 steps with a railing? : A Lot 6 Click Score: 17    End of Session Equipment Utilized During Treatment: Gait belt;Oxygen Activity Tolerance: Patient limited by fatigue Patient  left: in chair;with call bell/phone within reach;with chair alarm set Nurse Communication: Mobility status (Pt soaked with urine as purewick leaked.  Cleaned pt and changed her gown) PT Visit Diagnosis: Unsteadiness on feet (R26.81);Muscle weakness (generalized) (M62.81)    Time: 1610-9604 PT Time Calculation (min) (ACUTE ONLY): 24 min   Charges:   PT Evaluation $PT Eval Moderate Complexity: 1 Mod PT Treatments $Therapeutic Activity: 8-22 mins PT General Charges $$ ACUTE PT VISIT: 1 Visit        Kazuto Sevey M,PT Acute Rehab Services [phone]   Bevelyn Buckles 11/17/2022, 2:33 PM

## 2022-11-17 NOTE — TOC Transition Note (Addendum)
Transition of Care [facility]) - CM/SW Discharge Note   Patient Details  Name: Lauren Wood MRN: [id number] Date of Birth: September 12, 1960  [facility] CM/SW Contact:  Tom-Johnson, Hershal Coria, RN Phone Number: 11/17/2022, 1:44 PM   Clinical Narrative:     Patient is scheduled for discharge today.  Readmission Risk Assessment done. Outpatient f/u, hospital f/u and discharge instructions on AVS. Prescriptions sent to [facility] pharmacy and meds will be delivered to patient at bedside prior discharge. No TOC needs or recommendations noted. Sister [name] to transport at discharge.  No further TOC needs noted.  17:00- Home health recommended by PT, CM spoke with patient and sister, [name] and both has no preference. CM called in referral to [facility] and Marylene Land voiced acceptance, info on AVS.  CM informed that [name] patient needs portable O2 tank to take home with. Patient gets supplies from [employer]. CM called [employer] and poke with Earna Coder and a portable tank will be delivered to patient at bedside prior to discharge.   No further TOC needs noted.     Final next level of care: Home/Self Care Barriers to Discharge: Barriers Resolved   Patient Goals and CMS Choice CMS Medicare.gov Compare Post Acute Care list provided to:: Patient Choice offered to / list presented to : NA  Discharge Placement                  Patient to be transferred to facility by: Family      Discharge Plan and Services Additional resources added to the After Visit Summary for                  DME Arranged: N/A DME Agency: NA       HH Arranged: NA HH Agency: NA        Social Determinants of Health (SDOH) Interventions SDOH Screenings   Depression (PHQ2-9): Low Risk  (09/21/2022)  Tobacco Use: Low Risk  (11/16/2022)     Readmission Risk Interventions    11/17/2022    1:43 PM  Readmission Risk Prevention Plan  Post Dischage Appt Complete  Medication Screening Complete   Transportation Screening Complete

## 2022-11-17 NOTE — Assessment & Plan Note (Addendum)
Last echo in 2021, showed EF of 35%- 40%. Patient required BiPAP in ED d/t increased WOB and tachyapnea.  - redosed lasix 60 IV this AM - BiPAP overnight, now on HFNC 9L - duoneb x3 as patient had some wheezing on exam - Echo pending - strict I/Os

## 2022-11-17 NOTE — Progress Notes (Signed)
Contacted Sherwood Gambler, TOC case Production designer, theatre/television/film. She was informed that portable oxygen tank has not been delivered at bedside yet. She stated if O2 is not delivered by 7pm, to contact her again.

## 2022-11-17 NOTE — Evaluation (Signed)
Occupational Therapy Evaluation Patient Details Name: Lauren Wood MRN: [id number] DOB: 1961/02/27 Today's Date: 11/17/2022   History of Present Illness 62 y.o. female adm 7/4 with PHMx of CHF, HTN, morbid obesity, intellectual disability presenting with dyspnea and tachypnea with lethargy and altered mental status after being found down in bathroom by sister.   Clinical Impression   Patient admitted for the diagnosis above.  PTA patient continues to live at home with her older sister, who provides assist as needed.  Sister assists with medications, iADL, community mobility, and ADL if needed.  Patient states she completes all of her own ADL at home, but is currently needing up to Min A for lower body ADL, and close CGA for stand pivot transfers.  Patient is scheduled to return home today, so OT recommends prior level of support from family.  No post acute OT is anticipated.       Recommendations for follow up therapy are one component of a multi-disciplinary discharge planning process, led by the attending physician.  Recommendations may be updated based on patient status, additional functional criteria and insurance authorization.   Assistance Recommended at Discharge Intermittent Supervision/Assistance  Patient can return home with the following Help with stairs or ramp for entrance;Assist for transportation;Assistance with cooking/housework;Direct supervision/assist for financial management;Direct supervision/assist for medications management;A little help with bathing/dressing/bathroom    Functional Status Assessment  Patient has had a recent decline in their functional status and demonstrates the ability to make significant improvements in function in a reasonable and predictable amount of time.  Equipment Recommendations  None recommended by OT    Recommendations for Other Services       Precautions / Restrictions Precautions Precautions: Fall Precaution Comments: O2 at  baseline Restrictions Weight Bearing Restrictions: No      Mobility Bed Mobility Overal bed mobility: Needs Assistance Bed Mobility: Sit to Supine       Sit to supine: Supervision     Patient Response: Cooperative  Transfers Overall transfer level: Needs assistance   Transfers: Sit to/from Stand, Bed to chair/wheelchair/BSC Sit to Stand: Supervision, Min guard     Step pivot transfers: Supervision, Min guard            Balance Overall balance assessment: Needs assistance Sitting-balance support: Feet supported Sitting balance-Leahy Scale: Good     Standing balance support: Reliant on assistive device for balance Standing balance-Leahy Scale: Poor                             ADL either performed or assessed with clinical judgement   ADL       Grooming: Wash/dry hands;Min guard;Standing               Lower Body Dressing: Minimal assistance;Sit to/from stand;Moderate assistance   Toilet Transfer: Min guard;Stand-pivot;BSC/3in1;Rolling walker (2 wheels)                   Vision Patient Visual Report: No change from baseline       Perception     Praxis      Pertinent Vitals/Pain Pain Assessment Pain Assessment: No/denies pain Pain Intervention(s): Monitored during session     Hand Dominance Right   Extremity/Trunk Assessment Upper Extremity Assessment Upper Extremity Assessment: Overall WFL for tasks assessed   Lower Extremity Assessment Lower Extremity Assessment: Defer to PT evaluation   Cervical / Trunk Assessment Cervical / Trunk Assessment: Other exceptions Cervical / Trunk Exceptions: body habitus  Communication Communication Communication: HOH   Cognition Arousal/Alertness: Awake/alert Behavior During Therapy: WFL for tasks assessed/performed Overall Cognitive Status: History of cognitive impairments - at baseline                                       General Comments   VSS on O2     Exercises     Shoulder Instructions      Home Living Family/patient expects to be discharged to:: Private residence Living Arrangements: Other relatives Available Help at Discharge: Family;Available 24 hours/day Type of Home: House Home Access: Level entry     Home Layout: Two level;Able to live on main level with bedroom/bathroom     Bathroom Shower/Tub: Chief Strategy Officer: Standard Bathroom Accessibility: Yes How Accessible: Accessible via walker Home Equipment: Rolling Walker (2 wheels);Shower seat          Prior Functioning/Environment Prior Level of Function : Needs assist               ADLs Comments: Patient continues to participate with her own self care, and can help with light meal prep, but sister cares for all her needs including community mobility.        OT Problem List: Decreased activity tolerance;Impaired balance (sitting and/or standing);Decreased strength      OT Treatment/Interventions:      OT Goals(Current goals can be found in the care plan section) Acute Rehab OT Goals Patient Stated Goal: I am going home today OT Goal Formulation: With patient Time For Goal Achievement: 11/24/22 Potential to Achieve Goals: Good  OT Frequency:      Co-evaluation              AM-PAC OT "6 Clicks" Daily Activity     Outcome Measure Help from another person eating meals?: None Help from another person taking care of personal grooming?: None Help from another person toileting, which includes using toliet, bedpan, or urinal?: A Little Help from another person bathing (including washing, rinsing, drying)?: A Little Help from another person to put on and taking off regular upper body clothing?: A Little Help from another person to put on and taking off regular lower body clothing?: A Little 6 Click Score: 20   End of Session Equipment Utilized During Treatment: Rolling walker (2 wheels);Oxygen Nurse Communication: Mobility  status  Activity Tolerance: Patient tolerated treatment well Patient left: in bed;with call bell/phone within reach;with bed alarm set  OT Visit Diagnosis: Unsteadiness on feet (R26.81)                Time: 1610-9604 OT Time Calculation (min): 21 min Charges:  OT General Charges $OT Visit: 1 Visit OT Evaluation $OT Eval Moderate Complexity: 1 Mod  11/17/2022  RP, OTR/L  Acute Rehabilitation Services  Office:  [phone]   Suzanna Obey 11/17/2022, 1:42 PM

## 2022-11-17 NOTE — Progress Notes (Signed)
   11/17/22 0116  BiPAP/CPAP/SIPAP  BiPAP/CPAP/SIPAP Pt Type Adult  BiPAP/CPAP/SIPAP Resmed  Mask Type Full face mask  Mask Size Medium  Respiratory Rate 19 breaths/min  IPAP 14 cmH20  EPAP 7 cmH2O  Patient Home Equipment No  Auto Titrate No

## 2022-11-17 NOTE — Assessment & Plan Note (Addendum)
Last echo in 2021, showed EF of 35%- 40%. Patient required BiPAP in ED d/t increased WOB and tachyapnea. - redosed lasix 60 IV this AM - BiPAP overnight, now on HFNC 9L - duoneb x3 as patient had some wheezing on exam - Echo pending - strict I/Os - repeat BMP, Mag at noon

## 2022-11-17 NOTE — Progress Notes (Signed)
Delayed entry:  Rescinded discharge order. Patient unable to get portable oxygen tank delivered tonight. Per RN, spoke with [employer], Case worker who will plan for arrival of oxygen in the morning and will plan for discharge thereafter.  Darral Dash DO PGY-3 [facility] Family Medicine

## 2022-11-17 NOTE — Plan of Care (Signed)
  Problem: Cardiac: Goal: Ability to achieve and maintain adequate cardiopulmonary perfusion will improve Outcome: Progressing   

## 2022-11-17 NOTE — Telephone Encounter (Signed)
I did not need this encounter. °

## 2022-11-17 NOTE — Assessment & Plan Note (Signed)
Patient presented after being found down in bathroom by sister with pink foaming at mouth. Patient remained lethargic in ED. UDS negative. Salicylate and EToH negative. EEG without evidence of seizures or epileptiform discharges. - d/c neuro checks q4h

## 2022-11-17 NOTE — Procedures (Signed)
Patient Name: Lauren Wood  MRN: [id number]  Epilepsy Attending: Charlsie Quest  Referring Physician/Provider: Cyd Silence, MD  Date: 11/17/2022 Duration: 25.58 mins  Patient history: 62yo F with ams . EEG to evaluate for seizure  Level of alertness: Awake  AEDs during EEG study: None  Technical aspects: This EEG study was done with scalp electrodes positioned according to the 10-20 International system of electrode placement. Electrical activity was reviewed with band pass filter of 1-70Hz , sensitivity of 7 uV/mm, display speed of 51mm/sec with a 60Hz  notched filter applied as appropriate. EEG data were recorded continuously and digitally stored.  Video monitoring was available and reviewed as appropriate.  Description: The posterior dominant rhythm consists of 7 Hz activity of moderate voltage (25-35 uV) seen predominantly in posterior head regions, symmetric and reactive to eye opening and eye closing. EEG showed intermittent generalized 3 to 6 Hz theta-delta slowing. Hyperventilation and photic stimulation were not performed.     ABNORMALITY - Intermittent slow, generalized - Background slow  IMPRESSION: This study is suggestive of mild to moderate diffuse encephalopathy, nonspecific etiology. No seizures or epileptiform discharges were seen throughout the recording.  [name] Annabelle Harman

## 2022-11-17 NOTE — Consult Note (Signed)
   [facility] Inpatient Consult   11/17/2022  Lauren Wood 1961-03-02 [id number]    Primary Care Provider:  Nestor Ramp, MD   [facility] Liaison remote coverage review for patient admitted to [facility] showing on [facility] banner   Insurance:  Listed with [facility] Medicare Advantage which is not a [facility] on the risk contracted plans. Patient recently changed insurance plans and not listed with this plan for Care Management services at this time noted in demographic information.   Reason:  Not a beneficiary currently attributed to one of the [facility] ACO Registry populations with this current insurance plan.  Will sign off.  For additional questions or referrals please contact:    For questions, please call:  Charlesetta Shanks, RN BSN CCM [facility]  [phone] business mobile phone Toll free office [phone]  *Concierge Line  [phone] Fax number: [phone] [email or web address]

## 2022-11-17 NOTE — Discharge Instructions (Addendum)
Dear Ms. Bromell,   We admitted you to the hospital  because you were having difficulty breathing. We think that this was because of some fluid buildup in your lungs.  We gave you some medicines to help get this off.  We are adjusting some of the medicines that you will take when you go home.  Hopefully we can keep this from happening again.  It is very important that you take your medicines every day as prescribed.  We will see you back in our clinic on Monday 7/8 at 9:30am.   POST-HOSPITAL & CARE INSTRUCTIONS We recommend following up with your PCP within 1 week from being discharged from the hospital. Please let PCP/Specialists know of any changes in medications that were made which you will be able to see in the medications section of this packet. Please make sure you follow all instructions when taking your medications. If you have any questions, please ask your doctor.   Thank you for choosing [facility]! Take care and be well!  Family Medicine Teaching Service Inpatient Team [place]  [facility]  [address] [place], [place] [address] [phone]

## 2022-11-17 NOTE — Progress Notes (Addendum)
0839:Received call from patient's sister. Requesting for an update after provider rounds. Number listed in chart.   Respiratory contacted. Informed SpO2 75-76%. Scheduled IV lasix given. Patient able to follow commands. Echo being performed at bedside.

## 2022-11-17 NOTE — Progress Notes (Signed)
Echocardiogram 2D Echocardiogram has been performed.  Lauren Wood 11/17/2022, 8:53 AM

## 2022-11-17 NOTE — H&P (Addendum)
Hospital Admission History and Physical Service Pager: [phone]  Patient name: Lauren Wood Medical record number: [id number] Date of Birth: [birth year] Age: 62 y.o. Gender: female  Primary Care Provider: Nestor Ramp, MD Consultants: Code Status: Full Code which was confirmed with sister who is legal guardian  Preferred Emergency Contact:  Contact Information     Name Relation Home Work [place] Sister [phone]  [phone]       Chief Complaint: 62 yo F with PMHx of CHF (last echo 2021 w EF 35%-40%) who presents for dyspnea and altered mental status after being found down by sister in bathroom.   Assessment and Plan: JOHNESE BARMORE is a 62 y.o. female with PHMx of CHF, HTN, morbid obesity, intellectual disability presenting with dyspnea and tachypnea with lethargy and altered mental status after being found down in bathroom by sister. Patient was recently taken off home oxygen but complaining of increased SOB the past three days so sister has been giving her 3L for past three days. Patient also complaining of nausea with dry heaving but has not vomited. Today patient took off oxygen to go to bathroom and was found by sister down with pink frothing at mouth. Per sister, patient has not been displaying sick symptoms at home other than dry heaving. Patient arrived to ED very lethargic and required BiPAP. EDP stated they were not able to obtain stat VBGs but she suspects patient hypercarbia. Chest xray showed pulmonary congestion suggestive of CHF exacerbation. Differential includes CHF exacerbation with AMS d/t hypercarbia as patient has hx of obesity hypoventilation syndrome and CHF, also consider ARDS, pneumonia, aspiration pneumonitis though less likely as patient has not been displaying sick symptoms and chest xray showed no focal consolidation. AMS differential includes hypercarbia, head trauma, infxn such as UTI or PNA, electrolyte abnormality.   Hospital  Problem List      Hospital     * (Principal) Acute exacerbation of CHF (congestive heart failure)  (HCC)    Last EF of 35%- 40%. Patient required BiPAP in ED  d/t increased WOB and tachyapnea. CXR shows pulmonary vascular ocngestion - redose lasix 60 IV  - BiPAP overnight, reevaluate respiratory status in AM - duoneb x3 as patient had some wheezing on exam - Echocardiogram (previously completed in 2021) - strict I/Os   AMS (altered mental status)   Patient presented after being found down in bathroom by sister with  pink foaming at mouth. Patient remained lethargic in ED, but improved on reassessment. - neuro checks q4h - salicylate level  - ethanol - UDS  - EEG - TSH  UTI UA + for nitrites, many bacteria - CTX x1     Chronic condition management  HTN - held home BP medications d/t soft pressures, reevaluate in AM  CKD III Patient had slight increase to Cr on admission, continue to monitor - AM BMP   FEN/GI: NPO VTE Prophylaxis: Lovenox  Disposition: Progressive  History of Present Illness:  CATIE Wood is a 62 y.o. female presenting after she passed out in the bathroom and fell. Sister states patient has required oxygen for the last 3 days which was recently taken off. She reports last three days patient has been more out of breath and comes back from day program very tired and will essentially sleep the entire day. Patient went to see cardiologist to report increase SOB, they ordered another sleep apnea test. Today, patient went to bathroom so took off oxygen and  was subsequently found by sister down with pink foaming at mouth. Sister reports patient has no sick symptoms other than nausea with some dry heaving but no vomiting. She reports patients mood has ben good at home and that patient is taking all of her psychiatric medications as prescribed.    Limited hx obtained from patient as patient is lethargic and hard to arouse, patient reports she is having  difficulty breathing but "is alright" before falling back asleep.    Review Of Systems: According to sister: Review of Systems  Constitutional:  Positive for malaise/fatigue. Negative for chills and fever.  HENT:  Negative for congestion and sore throat.   Respiratory:  Positive for shortness of breath. Negative for cough.   Cardiovascular:  Positive for orthopnea. Negative for chest pain and palpitations.  Gastrointestinal:  Positive for nausea. Negative for abdominal pain, constipation, diarrhea, heartburn and vomiting.     Pertinent Past Medical History: CHF HTN Morbid Obesity with hypoventilation Intellectual disability  Extensive psych history including charted borderline personality disorder, depression, bipolar disorder CKD III   Remainder reviewed in history tab.   Pertinent Past Surgical History: Remainder reviewed in history tab.    Pertinent Social History: Tobacco use: According to sister, NO Alcohol use: According to sister, NO Other Substance use: According to sister, NO Lives with sister who is main caretaker and guardian   Pertinent Family History:   Remainder reviewed in history tab.   Important Outpatient Medications:  Furosemide 80 mg daily Losartan 50 mg daily Metoprolol 100 mg BID Spironolactone 25 mg daily Bupropion xl 150 mg daily Fluoxetine 40 mg daily Mirtazapine 30 mg QHS Risperidone 3 mg TID Trazodone 100 mg QHS  Objective: BP 124/83   Pulse 72   Temp 97.9 F (36.6 C) (Oral)   Resp (!) 29   Ht 5\' 7"  (1.702 m)   Wt 136.1 kg   LMP 05/08/2013   SpO2 99%   BMI 46.99 kg/m  Exam: Physical Exam Vitals reviewed.  Constitutional:      Appearance: She is obese.     Comments: laying in stretcher with eyes closed, difficult to arouse   HENT:     Mouth/Throat:     Mouth: Mucous membranes are moist.     Pharynx: Oropharynx is clear.  Eyes:     Pupils: Pupils are equal, round, and reactive to light.     Comments: strabismus   Cardiovascular:     Rate and Rhythm: Normal rate and regular rhythm.     Heart sounds: Normal heart sounds.  Pulmonary:     Breath sounds: Wheezing present. No rhonchi.     Comments: Tachypnea. Increased WOB Abdominal:     General: Bowel sounds are normal.  Skin:    General: Skin is warm and dry.  Neurological:     Comments: Orientated to person, place, time      Labs:  CBC BMET  Recent Labs  Lab 11/16/22 1627 11/16/22 1739 11/16/22 2018  WBC 10.0  --   --   HGB 9.8*   < > 11.2*  HCT 32.9*   < > 33.0*  PLT 215  --   --    < > = values in this interval not displayed.   Recent Labs  Lab 11/16/22 1627 11/16/22 1739 11/16/22 2018  NA 136 140  140 139  K 4.0 4.1  4.0 4.8  CL 105 105  --   CO2 22  --   --   BUN 22 23  --  CREATININE 1.41* 1.40*  --   GLUCOSE 116* 113*  --   CALCIUM 8.7*  --   --       EKG: LBBB, widened QRS, NSR   Imaging Studies Performed  DG Chest Portable 1 View  Result Date: 11/16/2022 CLINICAL DATA:  Respiratory distress. EXAM: PORTABLE CHEST 1 VIEW COMPARISON:  03/19/2020 FINDINGS: Low volume film. The cardio pericardial silhouette is enlarged. There is pulmonary vascular congestion without overt pulmonary edema. No acute bony abnormality. Telemetry leads overlie the chest. IMPRESSION: Low volume film with cardiomegaly and pulmonary vascular congestion. Electronically Signed   By: Kennith Center M.D.   On: 11/16/2022 17:19      Baloch, Mahnoor Baloch, MD 11/17/2022, 1:07 AM PGY-1, [facility] Family Medicine  FPTS Intern pager: [phone], text pages welcome Secure chat group [facility] Teaching Service   I was personally present and performed or re-performed the history, physical exam and medical decision making activities of this service and have verified that the service and findings are accurately documented in the Dr. Imagene Sheller note.  Darral Dash, DO PGY-3, [facility] Family Medicine

## 2022-11-17 NOTE — Progress Notes (Signed)
Reassessed patient at the bedside with Dr. Georg Ruddle.  She is much more awake, alert.  She asked that we give her sister an update.  She says that she still having some difficulty breathing, but was much more conversational than earlier.  Not as sleepy.  She is having improvement with diuresis, about 200 mL urine output after placement of Foley with IV Lasix.  EEG is currently being completed.  Would like her to wear BiPAP at least for few hours overnight given her persistent increased work of breathing.  Will also give DuoNeb treatment.  Darral Dash, DO PGY-3 [facility] Family Medicine

## 2022-11-17 NOTE — Progress Notes (Addendum)
Daily Progress Note Intern Pager: [phone]  Patient name: Lauren Wood Medical record number: [id number] Date of birth: 11-Nov-1960 Age: 62 y.o. Gender: female  Primary Care Provider: Nestor Ramp, MD Consultants: none Code Status: FULL   Assessment and Plan: Lauren Wood is a 62 y.o. female with PHMx of CHF, HTN, morbid obesity, intellectual disability presenting with dyspnea and tachypnea with lethargy and altered mental status after being found down in bathroom by sister.   Patient is on BiPAP 8L this AM. Improving with diuresis 1100 mL today so far. Awake and alert, able to communicate with head nods and thumbs up without any apparent confusion.  Hospital Problem List      Hospital     * (Principal) Acute exacerbation of CHF (congestive heart failure)  (HCC)     Last echo in 2021, showed EF of 35%- 40%. Patient required BiPAP in ED  d/t increased WOB and tachyapnea. - redosed lasix 60 IV this AM - BiPAP overnight, now on HFNC 9L - duoneb x3 as patient had some wheezing on exam - Echo pending - strict I/Os - repeat BMP, Mag at noon         RESOLVED: Essential hypertension, benign     - Held home BP medication d/t soft BP, consider restarting in AM.         RESOLVED: CKD (chronic kidney disease) stage 3, GFR 30-59 ml/min (HCC)     Slight bump in Cr on admission  - AM BMP        AMS (altered mental status)     Patient presented after being found down in bathroom by sister with  pink foaming at mouth. Patient remained lethargic in ED. UDS negative.  Salicylate and EToH negative. EEG without evidence of seizures or  epileptiform discharges. - d/c neuro checks q4h       FEN/GI: NPO PPx: Lovenox Dispo:Home pending clinical improvement . Barriers include adequate ventilation.   Subjective:  Seen today in bed.  Patient shook her head or gave thumbs up to questions, however conversation was difficult as she was on BiPAP and having echo at that time.  She  appeared in a pleasant mood and gave thumbs up when I asked about how she was feeling.  Objective: Temp:  [97.6 F (36.4 C)-98.1 F (36.7 C)] 98.1 F (36.7 C) (07/05 0759) Pulse Rate:  [59-72] 61 (07/05 0759) Resp:  [9-30] 20 (07/05 0759) BP: (116-143)/(54-89) 126/78 (07/05 0759) SpO2:  [90 %-100 %] 100 % (07/05 0846) FiO2 (%):  [40 %] 40 % (07/04 1630) Weight:  [136.1 kg-149.3 kg] 149.3 kg (07/05 0503) Physical Exam: General: ill-appearing, no acute distress Cardio: Deferred due to ongoing echo at the time Pulm: On BiPAP 8L Abdominal: soft, non-tender, non-distended Extremities: no peripheral edema  Neuro: alert Psych:  Cooperative  Laboratory: Most recent CBC Lab Results  Component Value Date   WBC 10.0 11/16/2022   HGB 11.2 (L) 11/16/2022   HCT 33.0 (L) 11/16/2022   MCV 91.1 11/16/2022   PLT 215 11/16/2022   Most recent BMP    Latest Ref Rng & Units 11/17/2022   12:53 AM  BMP  Glucose 70 - 99 mg/dL 84   BUN 8 - 23 mg/dL 21   Creatinine 1.47 - 1.00 mg/dL 8.29   Sodium 562 - 130 mmol/L 140   Potassium 3.5 - 5.1 mmol/L 4.5   Chloride 98 - 111 mmol/L 103   CO2 22 - 32 mmol/L 24  Calcium 8.9 - 10.3 mg/dL 9.0      TSH 1.610 UDS negative Awaiting urine culture  Imaging/Diagnostic Tests: CXR 11/16/22: Low volume film with cardiomegaly and pulmonary vascular congestion.   EKG: Sinus rhythm, LBBB, similar to prior   Awaiting EEG results  Cyndia Skeeters, DO 11/17/2022, 9:27 AM  PGY-1, [facility] Family Medicine FPTS Intern pager: [phone], text pages welcome Secure chat group [facility] Teaching Service

## 2022-11-17 NOTE — Assessment & Plan Note (Addendum)
Patient presented after being found down in bathroom by sister with pink foaming at mouth. Patient remained lethargic in ED. UDS negative. Salicylate and EToH negative. EEG without evidence of seizures or epileptiform discharges. - d/c neuro checks q4h 

## 2022-11-17 NOTE — Discharge Summary (Deleted)
Family Medicine Teaching [facility] Discharge Summary  Patient name: Lauren Wood Medical record number: [id number] Date of birth: 07/28/1960 Age: 62 y.o. Gender: female Date of Admission: 11/16/2022  Date of Discharge: 11/17/2022 Admitting Physician: Mahnoor Jeni Salles, MD  Primary Care Provider: Nestor Ramp, MD Consultants: none  Indication for Hospitalization: acute CHF exacerbation  Brief Hospital Course:  Lauren Wood is a 62 y.o. female with PHMx of CHF, HTN, morbid obesity, intellectual disability presenting with dyspnea and tachypnea with lethargy and altered mental status after being found down in bathroom by sister.  Her hospital course is as follows:  Acute exacerbation of CHF Patient presented in respiratory distress with lethargy.  VBG did not show hypercarbia however patient had been on BiPAP for an hour when this was drawn.  Patient required BiPAP initially due to increased work of breathing and tachypnea.  Patient given multiple DuoNebs, and oxygen was eventually weaned to HFNC.  CXR showed pulmonary vascular congestion and echo showed 30 to 35% ejection fraction consistent with prior studies.  Patient was diuresed with Lasix and respiratory and mental status improved improved.  By the time of discharge patient had diuresed over 3L and was on her home requirement of 2L O2. Her mental status had returned to baseline. She was discharged home on her home Lasix 80mg  daily. She is also on Losartan, Metoprolol, and Spironolactone for her systolic CHF. Not presently on an SGLT2i.   Altered mental status UDS negative. EEG without evidence of seizures. Patient was initially lethargic, but this improved throughout the course of hospitalization.  She became alert and oriented and able to communicate her situation.  She was aware that her sister was coming to see her and asked that we call her sister with updates.  Additionally, initially held patient's psych meds due to  somnolence, however restarted these once patient began to return to baseline.  By the time of discharge patient had returned to baseline mental status.   Follow-up Recommendations:  1. Recheck BMP and Magnesium after aggressive diuresis.  2. Some of the patient's psych medications were changed due to concern for over-sedation. Please assess mental status and readjust medications as necessary in the outpatient setting. 3. Please assess the patient's medication administration regimen. She is on many medications and adherence is uncertain. She may benefit from a pill organizer. 4. Recommend follow-up with her cardiologist for her systolic heart failure. Suspect she may benefit from addition of an SGLT2i but defer to Dr. Antoine Poche as it seems she has good follow-up with him.   Disposition: home  Discharge Condition: stable  Discharge Exam:  General: in good spirits, NAD Cardio: RRR, no murmur but exam limited by body habitus Pulm: Comfortable on 2L, no aberrant lung sounds but as above, exam is limited  Abdominal: Non-tender, non-distended Extremities: Trace pedal edema Neuro: Alert  Psych:  Pleasant and cooperative     Significant Labs and Imaging:  Recent Labs  Lab 11/16/22 1627 11/16/22 1739 11/16/22 2018  WBC 10.0  --   --   HGB 9.8* 10.9*  11.2* 11.2*  HCT 32.9* 32.0*  33.0* 33.0*  PLT 215  --   --    Recent Labs  Lab 11/16/22 1627 11/16/22 1739 11/16/22 2018 11/17/22 0053  NA 136 140  140 139 140  K 4.0 4.1  4.0 4.8 4.5  CL 105 105  --  103  CO2 22  --   --  24  GLUCOSE 116* 113*  --  84  BUN 22 23  --  21  CREATININE 1.41* 1.40*  --  1.37*  CALCIUM 8.7*  --   --  9.0  ALKPHOS 147*  --   --   --   AST 37  --   --   --   ALT 22  --   --   --   ALBUMIN 3.2*  --   --   --       Results/Tests Pending at Time of Discharge: Urine culture  Discharge Medications:  Allergies as of 11/17/2022       Reactions   Enalapril Maleate Other (See Comments)   Unknown  reaction - per family  Vasotec    Hctz [hydrochlorothiazide] Other (See Comments)   Hyperuricemia with Lasix    Lasix [furosemide] Other (See Comments)   Hyperuricemia with HCTZ         Medication List     STOP taking these medications    aspirin EC 81 MG tablet   benzonatate 100 MG capsule Commonly known as: Tessalon Perles   colchicine 0.6 MG tablet   fluticasone 50 MCG/ACT nasal spray Commonly known as: FLONASE   losartan 50 MG tablet Commonly known as: COZAAR   pregabalin 50 MG capsule Commonly known as: LYRICA   Semaglutide(0.25 or 0.5MG /DOS) 2 MG/1.5ML Sopn       TAKE these medications    acetaminophen 500 MG tablet Commonly known as: TYLENOL Take 500 mg by mouth every 6 (six) hours as needed for headache.   allopurinol 100 MG tablet Commonly known as: ZYLOPRIM TAKE ONE TABLET BY MOUTH ONCE DAILY   benztropine 0.5 MG tablet Commonly known as: COGENTIN Take 0.5 mg by mouth 2 (two) times daily.   buPROPion 150 MG 24 hr tablet Commonly known as: WELLBUTRIN XL Take 150 mg by mouth every morning.   clonazePAM 0.5 MG tablet Commonly known as: KLONOPIN Take 0.5 tablets (0.25 mg total) by mouth daily as needed for anxiety. What changed: when to take this   docusate sodium 100 MG capsule Commonly known as: COLACE Take 1 capsule (100 mg total) by mouth 2 (two) times daily.   FLUoxetine 40 MG capsule Commonly known as: PROZAC Take 1 capsule (40 mg total) by mouth every morning.   furosemide 80 MG tablet Commonly known as: LASIX TAKE ONE TABLET DAILY   metoprolol tartrate 50 MG tablet Commonly known as: LOPRESSOR Take 1 tablet (50 mg total) by mouth 2 (two) times daily. What changed:  medication strength how much to take   mirtazapine 15 MG tablet Commonly known as: REMERON Take 1 tablet (15 mg total) by mouth at bedtime. What changed:  medication strength how much to take   omeprazole 20 MG capsule Commonly known as: PRILOSEC TAKE ONE  CAPSULE DAILY   prazosin 1 MG capsule Commonly known as: MINIPRESS Take 1 mg by mouth at bedtime.   risperiDONE 3 MG tablet Commonly known as: RISPERDAL Take 3 mg by mouth 3 (three) times daily.   spironolactone 25 MG tablet Commonly known as: ALDACTONE TAKE ONE TABLET BY MOUTH DAILY   traZODone 50 MG tablet Commonly known as: DESYREL Take 1 tablet (50 mg total) by mouth at bedtime. What changed:  medication strength how much to take        Discharge Instructions: Please refer to Patient Instructions section of EMR for full details.  Patient was counseled important signs and symptoms that should prompt return to medical care, changes in medications, dietary instructions,  activity restrictions, and follow up appointments.    Cyndia Skeeters, DO 11/17/2022, 12:03 PM PGY-1, [facility]     I have evaluated this patient along with Dr. Mliss Sax  and reviewed the above note, making necessary revisions.  Dorothyann Gibbs, MD 11/17/2022, 1:13 PM PGY-2, [facility]

## 2022-11-17 NOTE — Progress Notes (Signed)
Heart Failure Navigator Progress Note  Assessed for Heart & Vascular TOC clinic readiness.  Patient EF 40-45% , No HF TOC per MD patient with a intellectual disability.   Navigator will sign off at this time.   Rhae Hammock, BSN, Scientist, clinical (histocompatibility and immunogenetics) Only

## 2022-11-17 NOTE — Progress Notes (Signed)
Contacted patient's sister- provided with updated. She stated she will be coming after her doctor's appointment.

## 2022-11-17 NOTE — Progress Notes (Signed)
Successful contact with patient's sister. She was informed patient has discharge orders. She stated that a provider already spoken to her. States she will not be able to get here until after 3 pm.

## 2022-11-17 NOTE — Progress Notes (Signed)
EEG complete - results pending 

## 2022-11-18 DIAGNOSIS — I5043 Acute on chronic combined systolic (congestive) and diastolic (congestive) heart failure: Secondary | ICD-10-CM | POA: Diagnosis not present

## 2022-11-18 NOTE — Progress Notes (Signed)
Successful contact with patient's sister. She stated she will be here within 15 minutes. MD informed.

## 2022-11-18 NOTE — Discharge Summary (Signed)
Family Medicine [facility] Discharge Summary  Patient name: Lauren Wood Medical record number: [id number] Date of birth: 1960/07/13 Age: 62 y.o. Gender: female Date of Admission: 11/16/2022  Date of Discharge: 11/18/22 Admitting Physician: Mahnoor Jeni Salles, MD  Primary Care Provider: Nestor Ramp, MD Consultants: None  Indication for Hospitalization: Acute CHF exacerbation   Brief Hospital Course:  Lauren Wood is a 62 y.o. female with PHMx of CHF, HTN, morbid obesity, intellectual disability presenting with dyspnea and tachypnea with lethargy and altered mental status after being found down in bathroom by sister.  Her hospital course is as follows:  Acute exacerbation of CHF Patient presented in respiratory distress with lethargy.  VBG did not show hypercarbia however patient had been on BiPAP for an hour when this was drawn.  Patient required BiPAP initially due to increased work of breathing and tachypnea.  Patient given multiple DuoNebs, and oxygen was eventually weaned to HFNC.  CXR showed pulmonary vascular congestion and echo showed 30 to 35% ejection fraction consistent with prior studies.  Patient was diuresed with Lasix and status improved.  By the time of discharge patient had diuresed over and was on 2L O2 and continuing to wean.  Altered mental status UDS negative. EEG without evidence of seizures. Patient was initially lethargic, but this improved throughout the course of hospitalization.  She became alert and oriented and able to communicate her situation.  She was aware that her sister was coming to see her and asked that we call her sister with updates.  Additionally, initially held patient's psych meds due to somnolence, however restarted these once patient began to return to baseline.  By the time of discharge patient had returned to baseline mental status.   Follow-up Recommendations:  1. Recheck BMP and Magnesium 2. Some of the patient's  psych medications were changed due to concern for over-sedation. Please assess mental status and readjust medications as necessary in the outpatient setting.   Disposition: Home  Discharge Condition: Stable   Discharge Exam:  Vitals:   11/18/22 0123 11/18/22 0434  BP: (!) 97/56 109/68  Pulse: 64 70  Resp: 20 20  Temp: 98.5 F (36.9 C) 98.6 F (37 C)  SpO2:  91%  General: Easily arousable, pleasant, no distress Respiratory: Normal work of breathing on 2 L nasal cannula, lungs clear anteriorly to auscultation Abdomen: Soft and nontender Extremities: No peripheral edema Neuro: Alert, speaks in full sentences that is easy to understand, although speech slightly dysarthric which is her baseline Psych: Normal mood and affect  Significant Procedures:   Significant Labs and Imaging:  Recent Labs  Lab 11/16/22 1627 11/16/22 1739 11/16/22 2018  WBC 10.0  --   --   HGB 9.8* 10.9*  11.2* 11.2*  HCT 32.9* 32.0*  33.0* 33.0*  PLT 215  --   --    Recent Labs  Lab 11/16/22 1627 11/16/22 1739 11/16/22 2018 11/17/22 0053 11/17/22 1140  NA 136 140  140 139 140 139  K 4.0 4.1  4.0 4.8 4.5 5.1  CL 105 105  --  103 101  CO2 22  --   --  24 28  GLUCOSE 116* 113*  --  84 81  BUN 22 23  --  21 19  CREATININE 1.41* 1.40*  --  1.37* 1.34*  CALCIUM 8.7*  --   --  9.0 9.0  MG  --   --   --   --  2.0  ALKPHOS 147*  --   --   --   --  AST 37  --   --   --   --   ALT 22  --   --   --   --   ALBUMIN 3.2*  --   --   --   --     Results/Tests Pending at Time of Discharge: None  Discharge Medications:  Allergies as of 11/18/2022       Reactions   Enalapril Maleate Other (See Comments)   Unknown reaction - per family  Vasotec    Hctz [hydrochlorothiazide] Other (See Comments)   Hyperuricemia with Lasix    Lasix [furosemide] Other (See Comments)   Hyperuricemia with HCTZ         Medication List     STOP taking these medications    aspirin EC 81 MG tablet   benzonatate  100 MG capsule Commonly known as: Tessalon Perles   colchicine 0.6 MG tablet   fluticasone 50 MCG/ACT nasal spray Commonly known as: FLONASE   losartan 50 MG tablet Commonly known as: COZAAR   pregabalin 50 MG capsule Commonly known as: LYRICA   Semaglutide(0.25 or 0.5MG /DOS) 2 MG/1.5ML Sopn       TAKE these medications    acetaminophen 500 MG tablet Commonly known as: TYLENOL Take 500 mg by mouth every 6 (six) hours as needed for headache.   allopurinol 100 MG tablet Commonly known as: ZYLOPRIM TAKE ONE TABLET BY MOUTH ONCE DAILY   benztropine 0.5 MG tablet Commonly known as: COGENTIN Take 0.5 mg by mouth 2 (two) times daily.   buPROPion 150 MG 24 hr tablet Commonly known as: WELLBUTRIN XL Take 150 mg by mouth every morning.   clonazePAM 0.5 MG tablet Commonly known as: KLONOPIN Take 0.5 tablets (0.25 mg total) by mouth daily as needed for anxiety. What changed: when to take this   docusate sodium 100 MG capsule Commonly known as: COLACE Take 1 capsule (100 mg total) by mouth 2 (two) times daily.   FLUoxetine 40 MG capsule Commonly known as: PROZAC Take 1 capsule (40 mg total) by mouth every morning.   furosemide 80 MG tablet Commonly known as: LASIX TAKE ONE TABLET DAILY   metoprolol tartrate 50 MG tablet Commonly known as: LOPRESSOR Take 1 tablet (50 mg total) by mouth 2 (two) times daily. What changed:  medication strength how much to take   mirtazapine 15 MG tablet Commonly known as: REMERON Take 1 tablet (15 mg total) by mouth at bedtime. What changed:  medication strength how much to take   omeprazole 20 MG capsule Commonly known as: PRILOSEC TAKE ONE CAPSULE DAILY   prazosin 1 MG capsule Commonly known as: MINIPRESS Take 1 mg by mouth at bedtime.   risperiDONE 3 MG tablet Commonly known as: RISPERDAL Take 3 mg by mouth 3 (three) times daily.   spironolactone 25 MG tablet Commonly known as: ALDACTONE TAKE ONE TABLET BY MOUTH  DAILY   traZODone 50 MG tablet Commonly known as: DESYREL Take 1 tablet (50 mg total) by mouth at bedtime. What changed:  medication strength how much to take        Discharge Instructions: Please refer to Patient Instructions section of EMR for full details.  Patient was counseled important signs and symptoms that should prompt return to medical care, changes in medications, dietary instructions, activity restrictions, and follow up appointments.   Follow-Up Appointments:  Follow-up Information     [facility] Follow up.   Specialty: Home Health Services Why: Someone will call you  to schedule first home visit. If you have not received a call after two days of discharging home, call their number listed. If no one comes to assess, call Case Manager at [phone]. Contact information: [address] [place] [address] [phone]                 Darral Dash, DO 11/18/2022, 6:28 AM PGY-3, [facility]

## 2022-11-18 NOTE — Progress Notes (Signed)
Informed by night shift that oxygen got delivered at 2130. Unsuccessful contact with sister this morning to arrange for pickup. Will attempt later.

## 2022-11-18 NOTE — Progress Notes (Signed)
Pt refused BIPAP tonight 

## 2022-11-20 ENCOUNTER — Telehealth: Payer: Self-pay

## 2022-11-20 ENCOUNTER — Encounter: Payer: Self-pay | Admitting: Family Medicine

## 2022-11-20 ENCOUNTER — Ambulatory Visit: Payer: Medicare (Managed Care) | Admitting: Family Medicine

## 2022-11-20 VITALS — BP 101/70 | HR 68 | Ht 67.0 in | Wt 315.4 lb

## 2022-11-20 DIAGNOSIS — I5032 Chronic diastolic (congestive) heart failure: Secondary | ICD-10-CM

## 2022-11-20 DIAGNOSIS — I5023 Acute on chronic systolic (congestive) heart failure: Secondary | ICD-10-CM

## 2022-11-20 DIAGNOSIS — R4182 Altered mental status, unspecified: Secondary | ICD-10-CM

## 2022-11-20 MED ORDER — ACETAMINOPHEN 500 MG PO TABS: 500.0000 mg | ORAL_TABLET | Freq: Four times a day (QID) | ORAL | 1 refills | Status: DC | PRN

## 2022-11-20 MED ORDER — SPIRONOLACTONE 25 MG PO TABS: 25.0000 mg | ORAL_TABLET | Freq: Every day | ORAL | 1 refills | Status: DC

## 2022-11-20 MED ORDER — METOPROLOL TARTRATE 50 MG PO TABS: 50.0000 mg | ORAL_TABLET | Freq: Two times a day (BID) | ORAL | 1 refills | Status: DC

## 2022-11-20 MED ORDER — TRAZODONE HCL 50 MG PO TABS: 50.0000 mg | ORAL_TABLET | Freq: Every day | ORAL | 1 refills | Status: DC

## 2022-11-20 MED ORDER — FLUOXETINE HCL 40 MG PO CAPS: 40.0000 mg | ORAL_CAPSULE | Freq: Every morning | ORAL | 1 refills | Status: DC

## 2022-11-20 MED ORDER — MIRTAZAPINE 15 MG PO TABS: 15.0000 mg | ORAL_TABLET | Freq: Every day | ORAL | 1 refills | Status: DC

## 2022-11-20 NOTE — Assessment & Plan Note (Addendum)
Improved s/p diuresis, compliant with home lasix. Euvolemic on exam today. Instructed can have breaks from oxygen if SpO2 at home >94%, may still need with ambulation, hopeful can d/c eventually. Follow up with Cardiology soon. Obtaining labs today.

## 2022-11-20 NOTE — Progress Notes (Signed)
    SUBJECTIVE:   CHIEF COMPLAINT / HPI:   HOSPITAL FOLLOW UP - AoC CHF p/w dyspnea, AMS after found down by sister. Hospital course as follows:   Acute exacerbation of CHF Patient presented in respiratory distress with lethargy.  VBG did not show hypercarbia however patient had been on BiPAP for an hour when this was drawn.  Patient required BiPAP initially due to increased work of breathing and tachypnea.  Patient given multiple DuoNebs, and oxygen was eventually weaned to HFNC.  CXR showed pulmonary vascular congestion and echo showed 30 to 35% ejection fraction consistent with prior studies.  Patient was diuresed with Lasix and status improved.  By the time of discharge patient had diuresed over and was on 2L O2 and continuing to wean.   Altered mental status UDS negative. EEG without evidence of seizures. Patient was initially lethargic, but this improved throughout the course of hospitalization.  She became alert and oriented and able to communicate her situation.  She was aware that her sister was coming to see her and asked that we call her sister with updates.  Additionally, initially held patient's psych meds due to somnolence, however restarted these once patient began to return to baseline.  By the time of discharge patient had returned to baseline mental status.  Sister who is legal guardian states breathing has been getting slowly better. Unsure if she has had any swelling. States mental status is getting slowly back to baseline. Saw psychiatrist 2 weeks ago, sees again in 6 months.    OBJECTIVE:   BP 101/70   Pulse 68   Ht 5\' 7"  (1.702 m)   Wt (!) 315 lb 6.4 oz (143.1 kg)   LMP 05/08/2013   SpO2 98%   BMI 49.40 kg/m   Gen: well appearing, in NAD Card: RRR Lungs: CTAB. No wheeze, rales. Ext: WWP, no edema   ASSESSMENT/PLAN:   Acute exacerbation of CHF (congestive heart failure) (HCC) Improved s/p diuresis, compliant with home lasix. Euvolemic on exam today.  Instructed can have breaks from oxygen if SpO2 at home >94%, may still need with ambulation, hopeful can d/c eventually. Follow up with Cardiology soon. Obtaining labs today.  AMS (altered mental status) Improving with improvement in volume status and breathing. Continue current psych meds. F/u with psychiatry soon.      Caro Laroche, DO

## 2022-11-20 NOTE — Assessment & Plan Note (Signed)
Improving with improvement in volume status and breathing. Continue current psych meds. F/u with psychiatry soon.

## 2022-11-20 NOTE — Patient Instructions (Addendum)
It was great to see you!  Our plans for today:  - No changes to your medications today. We sent refills to your pharmacy. - If your oxygen level is staying above 94%, you can take off your oxygen.  - See your cardiologist and psychiatrist soon.  - Come back to see Dr. Jennette Kettle in a few months for check up.   We are checking some labs today, we will release these results to your MyChart.  Take care and seek immediate care sooner if you develop any concerns.   Dr. Linwood Dibbles

## 2022-11-20 NOTE — Transitions of Care (Post Inpatient/ED Visit) (Signed)
   11/20/2022  Name: Lauren Wood MRN: [id number] DOB: [birth year]  Today's TOC FU Call Status: Today's TOC FU Call Status:: Unsuccessul Call (1st Attempt) Unsuccessful Call (1st Attempt) Date: 11/20/22  Attempted to reach the patient regarding the most recent Inpatient/ED visit.  Follow Up Plan: Additional outreach attempts will be made to reach the patient to complete the Transitions of Care (Post Inpatient/ED visit) call.   Jodelle Gross, RN, BSN, CCM Care Management Coordinator [facility] Phone: [phone]/Fax: [phone]

## 2022-11-21 ENCOUNTER — Telehealth: Payer: Self-pay

## 2022-11-21 LAB — BASIC METABOLIC PANEL
BUN/Creatinine Ratio: 17 (ref 12–28)
BUN: 24 mg/dL (ref 8–27)
CO2: 28 mmol/L (ref 20–29)
Calcium: 9.5 mg/dL (ref 8.7–10.3)
Chloride: 102 mmol/L (ref 96–106)
Creatinine, Ser: 1.4 mg/dL — ABNORMAL HIGH (ref 0.57–1.00)
Glucose: 95 mg/dL (ref 70–99)
Potassium: 4.2 mmol/L (ref 3.5–5.2)
Sodium: 146 mmol/L — ABNORMAL HIGH (ref 134–144)
eGFR: 43 mL/min/{1.73_m2} — ABNORMAL LOW (ref >59)

## 2022-11-21 LAB — MAGNESIUM: Magnesium: 1.8 mg/dL (ref 1.6–2.3)

## 2022-11-21 NOTE — Transitions of Care (Post Inpatient/ED Visit) (Signed)
11/21/2022  Name: Lauren Wood MRN: [id number] DOB: [birth year]  Today's TOC FU Call Status: Today's TOC FU Call Status:: Successful TOC FU Call Competed TOC FU Call Complete Date: 11/21/22  Transition Care Management Follow-up Telephone Call Date of Discharge: 11/18/22 Discharge Facility: [facility]) Type of Discharge: Inpatient Admission Primary Inpatient Discharge Diagnosis:: Acute Congestive Heart Failure How have you been since you were released from the hospital?: Better (Per Sister, patient is doing better.  She was able to go to her day program today.) Any questions or concerns?: No  Items Reviewed: Did you receive and understand the discharge instructions provided?: Yes Medications obtained,verified, and reconciled?: Yes (Medications Reviewed) Any new allergies since your discharge?: No Dietary orders reviewed?: No Do you have support at home?: Yes People in Home: sibling(s) Name of Support/Comfort Primary Source: [name]  Medications Reviewed Today: Medications Reviewed Today     Reviewed by Jodelle Gross, RN (Case Manager) on 11/21/22 at 1032  Med List Status: <None>   Medication Order Taking? Sig Documenting Provider Last Dose Status Informant  acetaminophen (TYLENOL) 500 MG tablet [id number] Yes Take 1 tablet (500 mg total) by mouth every 6 (six) hours as needed for headache. Caro Laroche, DO Taking Active   allopurinol (ZYLOPRIM) 100 MG tablet [id number] Yes TAKE ONE TABLET BY MOUTH ONCE DAILY Maury Dus, MD Taking Active Family Member, Pharmacy Records  benztropine (COGENTIN) 0.5 MG tablet [id number] Yes Take 0.5 mg by mouth 2 (two) times daily. [provider] Taking Active Family Member, Pharmacy Records  buPROPion (WELLBUTRIN XL) 150 MG 24 hr tablet [id number] Yes Take 150 mg by mouth every morning. [provider] Taking Active Family Member, Pharmacy Records           Med Note (SATTERFIELD, Genoveva Ill   Tue Mar 16, 2020  5:55 PM)     clonazePAM (KLONOPIN) 0.5 MG tablet [id number] Yes Take 0.5 tablets (0.25 mg total) by mouth daily as needed for anxiety.  Patient taking differently: Take 0.25 mg by mouth 2 (two) times daily.   Cathleen Corti, MD Taking Active Family Member, Pharmacy Records  docusate sodium (COLACE) 100 MG capsule [id number] No Take 1 capsule (100 mg total) by mouth 2 (two) times daily. Maury Dus, MD Unknown Active Family Member, Pharmacy Records  FLUoxetine [facility]) 40 MG capsule [id number] Yes Take 1 capsule (40 mg total) by mouth every morning. Caro Laroche, DO Taking Active   furosemide (LASIX) 80 MG tablet [id number] Yes TAKE ONE TABLET DAILY Maury Dus, MD Taking Active Family Member, Pharmacy Records  metoprolol tartrate (LOPRESSOR) 50 MG tablet [id number] Yes Take 1 tablet (50 mg total) by mouth 2 (two) times daily. Caro Laroche, DO Taking Active   mirtazapine (REMERON) 15 MG tablet [id number] Yes Take 1 tablet (15 mg total) by mouth at bedtime. Caro Laroche, DO Taking Active   omeprazole (PRILOSEC) 20 MG capsule [id number] Yes TAKE ONE CAPSULE DAILY Maury Dus, MD Taking Active Family Member, Pharmacy Records  prazosin (MINIPRESS) 1 MG capsule [id number] Yes Take 1 mg by mouth at bedtime. [provider] Taking Active Family Member, Pharmacy Records  risperiDONE (RISPERDAL) 3 MG tablet [id number] Yes Take 3 mg by mouth 3 (three) times daily. [provider] Taking Active Family Member, Pharmacy Records  spironolactone (ALDACTONE) 25 MG tablet [id number] Yes Take 1 tablet (25 mg total) by mouth daily. Caro Laroche, DO Taking Active   traZODone (DESYREL) 50 MG tablet [id number] Yes Take 1 tablet (50  mg total) by mouth at bedtime. Caro Laroche, DO Taking Active             Home Care and Equipment/Supplies: Were Home Health Services Ordered?: No Any new equipment or medical supplies ordered?: No  Functional Questionnaire: Do you need assistance  with bathing/showering or dressing?: No Do you need assistance with meal preparation?: No Do you need assistance with eating?: No Do you have difficulty maintaining continence: No Do you need assistance with getting out of bed/getting out of a chair/moving?: No Do you have difficulty managing or taking your medications?: Yes  Follow up appointments reviewed: PCP Follow-up appointment confirmed?: Yes Date of PCP follow-up appointment?: 11/20/22 Follow-up Provider: Dr. Linwood Dibbles Specialist [facility] Follow-up appointment confirmed?: Yes Date of Specialist follow-up appointment?: 11/27/22 Follow-Up Specialty Provider:: Bernadene Person, NP (Pulmonary) Do you need transportation to your follow-up appointment?: No Do you understand care options if your condition(s) worsen?: Yes-patient verbalized understanding  SDOH Interventions Today    Flowsheet Row Most Recent Value  SDOH Interventions   Housing Interventions Intervention Not Indicated  Transportation Interventions Intervention Not Indicated      Jodelle Gross, RN, BSN, CCM Care Management Coordinator [facility] Phone: [phone]/Fax: [phone]

## 2022-11-27 ENCOUNTER — Ambulatory Visit: Payer: Medicare (Managed Care) | Attending: Nurse Practitioner | Admitting: Nurse Practitioner

## 2022-11-27 NOTE — Progress Notes (Deleted)
Office Visit    Patient Name: Lauren Wood Date of Encounter: 11/27/2022  Primary Care Provider:  Nestor Ramp, MD Primary Cardiologist:  Rollene Rotunda, MD  Chief Complaint    62 year old female with a history of chronic systolic heart failure, mitral valve regurgitation, hypertension, CKD stage III, OSA, and obesity who presents for hospital follow-up related to heart failure.  Past Medical History    Past Medical History:  Diagnosis Date   ALLERGIC RHINITIS 08/02/2009   Arthritis    Boil of vulva 11/07/2012   Borderline personality disorder (HCC) 05/31/2011   CKD (chronic kidney disease) stage 3, GFR 30-59 ml/min (HCC)    chronic   Gout of foot 09/26/2013   L foot 09/2013. In the setting of HCTZ 25 mg and lasix 40 mg daily. Stopped HCTZ after decreasing to 12.5 mg. Treated with colchicine.     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      PRESSURE ULCER OTHER SITE 10/01/2009   hx of   Sleep apnea    uses cpap, setting of 2   Unspecified psychosis 12/20/2009   Past Surgical History:  Procedure Laterality Date   COLONOSCOPY N/A 06/24/2013   Procedure: COLONOSCOPY;  Surgeon: Iva Boop, MD;  Location: [facility] ENDOSCOPY;  Service: Endoscopy;  Laterality: N/A;   COLONOSCOPY WITH PROPOFOL N/A 10/05/2015   Procedure: COLONOSCOPY WITH PROPOFOL;  Surgeon: Iva Boop, MD;  Location: [facility] ENDOSCOPY;  Service: Endoscopy;  Laterality: N/A;   COLONOSCOPY WITH PROPOFOL N/A 09/26/2021   Procedure: COLONOSCOPY WITH PROPOFOL;  Surgeon: Iva Boop, MD;  Location: [facility] ENDOSCOPY;  Service: Gastroenterology;  Laterality: N/A;   cyst removed  2011   forehead   TEE WITHOUT CARDIOVERSION N/A 03/19/2020    Procedure: TRANSESOPHAGEAL ECHOCARDIOGRAM (TEE);  Surgeon: Lewayne Bunting, MD;  Location: [facility] ENDOSCOPY;  Service: Cardiovascular;  Laterality: N/A;   TYMPANOSTOMY TUBE PLACEMENT Bilateral yrs ago    Allergies  Allergies  Allergen Reactions   Enalapril Maleate Other (See Comments)    Unknown reaction - per family   Vasotec    Hctz [Hydrochlorothiazide] Other (See Comments)    Hyperuricemia with Lasix    Lasix [Furosemide] Other (See Comments)    Hyperuricemia with HCTZ       Labs/Other Studies Reviewed    The following studies were reviewed today:  Cardiac Studies & Procedures       ECHOCARDIOGRAM  ECHOCARDIOGRAM COMPLETE 11/17/2022  Narrative ECHOCARDIOGRAM REPORT    Patient Name:   Lauren Wood Date of Exam: 11/17/2022 Medical Rec #:  [id number]       Height:       67.0 in Accession #:    [id number]      Weight:       329.1 lb Date of Birth:  December 13, 1960       BSA:          2.499 m Patient Age:    61 years        BP:           131/63 mmHg Patient Gender: F  HR:           68 bpm. Exam Location:  Inpatient  Procedure: 2D Echo, Cardiac Doppler, Color Doppler and Intracardiac Opacification Agent  Indications:    CHF- Acute Systolic  History:        Patient has prior history of Echocardiogram examinations, most recent 03/19/2020. CHF, Arrythmias:LBBB; Risk Factors:Hypertension.  Sonographer:    Raeford Razor Referring Phys: [id number] MARSHALL L CHAMBLISS   Sonographer Comments: Image acquisition challenging due to patient body habitus. Attempted Definity on patient while supine. Definity could not appear on the exam. IMPRESSIONS   1. Left ventricular ejection fraction, by estimation, is 30 to 35%. The left ventricle has moderately decreased function. The left ventricle demonstrates global hypokinesis. There is mild left ventricular hypertrophy. Left ventricular diastolic parameters are indeterminate. 2. Right ventricular systolic function is normal. The  right ventricular size is normal. 3. Left atrial size was moderately dilated. 4. The mitral valve is normal in structure. Moderate mitral valve regurgitation. No evidence of mitral stenosis. 5. The aortic valve is normal in structure. Aortic valve regurgitation is not visualized. No aortic stenosis is present. 6. The inferior vena cava is normal in size with greater than 50% respiratory variability, suggesting right atrial pressure of 3 mmHg.  FINDINGS Left Ventricle: Left ventricular ejection fraction, by estimation, is 30 to 35%. The left ventricle has moderately decreased function. The left ventricle demonstrates global hypokinesis. Definity contrast agent was given IV to delineate the left ventricular endocardial borders. The left ventricular internal cavity size was normal in size. There is mild left ventricular hypertrophy. Abnormal (paradoxical) septal motion, consistent with left bundle branch block. Left ventricular diastolic parameters are indeterminate.  Right Ventricle: The right ventricular size is normal. No increase in right ventricular wall thickness. Right ventricular systolic function is normal.  Left Atrium: Left atrial size was moderately dilated.  Right Atrium: Right atrial size was normal in size.  Pericardium: There is no evidence of pericardial effusion.  Mitral Valve: The mitral valve is normal in structure. There is mild thickening of the mitral valve leaflet(s). There is mild calcification of the mitral valve leaflet(s). Moderate mitral valve regurgitation. No evidence of mitral valve stenosis.  Tricuspid Valve: The tricuspid valve is normal in structure. Tricuspid valve regurgitation is not demonstrated. No evidence of tricuspid stenosis.  Aortic Valve: The aortic valve is normal in structure. Aortic valve regurgitation is not visualized. No aortic stenosis is present. Aortic valve mean gradient measures 7.0 mmHg. Aortic valve peak gradient measures 12.7 mmHg.  Aortic valve area, by VTI measures 1.48 cm.  Pulmonic Valve: The pulmonic valve was normal in structure. Pulmonic valve regurgitation is not visualized. No evidence of pulmonic stenosis.  Aorta: The aortic root is normal in size and structure.  Venous: The inferior vena cava is normal in size with greater than 50% respiratory variability, suggesting right atrial pressure of 3 mmHg.  IAS/Shunts: No atrial level shunt detected by color flow Doppler.   LEFT VENTRICLE PLAX 2D LVIDd:         6.00 cm LVIDs:         5.00 cm LV PW:         1.50 cm LV IVS:        1.20 cm LVOT diam:     2.00 cm LV SV:         56 LV SV Index:   22 LVOT Area:     3.14 cm  LV Volumes (MOD) LV vol d, MOD A2C:  321.0 ml LV vol d, MOD A4C: 264.0 ml LV vol s, MOD A2C: 209.0 ml LV vol s, MOD A4C: 183.0 ml LV SV MOD A2C:     112.0 ml LV SV MOD A4C:     264.0 ml LV SV MOD BP:      104.8 ml  RIGHT VENTRICLE             IVC RV S prime:     14.90 cm/s  IVC diam: 2.40 cm TAPSE (M-mode): 3.0 cm  LEFT ATRIUM              Index        RIGHT ATRIUM           Index LA diam:        4.20 cm  1.68 cm/m   RA Area:     12.10 cm LA Vol (A2C):   133.0 ml 53.22 ml/m  RA Volume:   24.50 ml  9.80 ml/m LA Vol (A4C):   66.1 ml  26.45 ml/m LA Biplane Vol: 95.2 ml  38.09 ml/m AORTIC VALVE AV Area (Vmax):    1.50 cm AV Area (Vmean):   1.45 cm AV Area (VTI):     1.48 cm AV Vmax:           178.00 cm/s AV Vmean:          116.000 cm/s AV VTI:            0.375 m AV Peak Grad:      12.7 mmHg AV Mean Grad:      7.0 mmHg LVOT Vmax:         84.80 cm/s LVOT Vmean:        53.700 cm/s LVOT VTI:          0.177 m LVOT/AV VTI ratio: 0.47  AORTA Ao Root diam: 2.60 cm Ao Asc diam:  3.10 cm  MITRAL VALVE MV Area (PHT): 3.65 cm     SHUNTS MV Decel Time: 208 msec     Systemic VTI:  0.18 m MR Peak grad: 109.8 mmHg    Systemic Diam: 2.00 cm MR Mean grad: 68.0 mmHg MR Vmax:      524.00 cm/s MR Vmean:     385.0 cm/s MV E  velocity: 150.00 cm/s MV A velocity: 100.00 cm/s MV E/A ratio:  1.50  Donato Schultz MD Electronically signed by Donato Schultz MD Signature Date/Time: 11/17/2022/10:50:36 AM    Final   TEE  ECHO TEE 03/19/2020  Narrative TRANSESOPHOGEAL ECHO REPORT    Patient Name:   [facility] Date of Exam: 03/19/2020 Medical Rec #:  [id number]       Height:       64.0 in Accession #:    [id number]      Weight:       342.6 lb Date of Birth:  07-18-1960       BSA:          2.459 m Patient Age:    59 years        BP:           134/93 mmHg Patient Gender: F               HR:           104 bpm. Exam Location:  Inpatient  Procedure: 3D Echo, Transesophageal Echo, Cardiac Doppler and Color Doppler  Indications:     I34.0 Nonrheumatic mitral (valve) insufficiency  History:  Patient has prior history of Echocardiogram examinations, most recent 03/17/2020. CHF, Mitral Valve Disease; Risk Factors:Sleep Apnea. Hypoxia. Mitral regurgitation.  Sonographer:     Sheralyn Boatman RDCS Referring Phys:  [id number] BRIAN S CRENSHAW Diagnosing Phys: Olga Millers MD  PROCEDURE: After discussion of the risks and benefits of a TEE, an informed consent was obtained from the patient. The transesophogeal probe was passed without difficulty through the esophogus of the patient. Imaged were obtained with the patient in a left lateral decubitus position. Local oropharyngeal anesthetic was provided with Cetacaine. Sedation performed by different physician. The patient was monitored while under deep sedation. Anesthestetic sedation was provided intravenously by Anesthesiology: 326mg  of Propofol. The patient developed Respiratory depression during the procedure.  IMPRESSIONS   1. Moderate global reduction in LV systolic function; moderate LAE; mild prolapse of anterior MV leaflet with mildly restricted posterior leaflet; severe MR. 2. Left ventricular ejection fraction, by estimation, is 35 to 40%. The left ventricle has  moderately decreased function. The left ventricle demonstrates global hypokinesis. 3. Right ventricular systolic function is normal. The right ventricular size is normal. 4. Left atrial size was moderately dilated. No left atrial/left atrial appendage thrombus was detected. 5. The mitral valve is abnormal. Severe mitral valve regurgitation. 6. The aortic valve is tricuspid. Aortic valve regurgitation is not visualized. No aortic stenosis is present. 7. There is mild (Grade II) plaque involving the descending aorta.  FINDINGS Left Ventricle: Left ventricular ejection fraction, by estimation, is 35 to 40%. The left ventricle has moderately decreased function. The left ventricle demonstrates global hypokinesis. The left ventricular internal cavity size was normal in size.  Right Ventricle: The right ventricular size is normal. Right vetricular wall thickness was not assessed. Right ventricular systolic function is normal.  Left Atrium: Left atrial size was moderately dilated. No left atrial/left atrial appendage thrombus was detected.  Right Atrium: Right atrial size was normal in size.  Pericardium: There is no evidence of pericardial effusion.  Mitral Valve: The mitral valve is abnormal. Severe mitral valve regurgitation. MV peak gradient, 7.4 mmHg. The mean mitral valve gradient is 3.0 mmHg.  Tricuspid Valve: The tricuspid valve is normal in structure. Tricuspid valve regurgitation is trivial.  Aortic Valve: The aortic valve is tricuspid. Aortic valve regurgitation is not visualized. No aortic stenosis is present.  Pulmonic Valve: The pulmonic valve was normal in structure. Pulmonic valve regurgitation is trivial.  Aorta: The aortic root is normal in size and structure. There is mild (Grade II) plaque involving the descending aorta.  IAS/Shunts: No atrial level shunt detected by color flow Doppler.  Additional Comments: Moderate global reduction in LV systolic function; moderate LAE;  mild prolapse of anterior MV leaflet with mildly restricted posterior leaflet; severe MR.   MITRAL VALVE MV Peak grad: 7.4 mmHg MV Mean grad: 3.0 mmHg MV Vmax:      1.36 m/s MV Vmean:     74.9 cm/s MR Peak grad:    101.2 mmHg MR Mean grad:    64.5 mmHg MR Vmax:         503.00 cm/s MR Vmean:        379.5 cm/s MR PISA:         2.26 cm MR PISA Eff ROA: 17 mm MR PISA Radius:  0.60 cm  Olga Millers MD Electronically signed by Olga Millers MD Signature Date/Time: 03/19/2020/12:01:53 PM    Final           Recent Labs: 11/16/2022: ALT 22; B Natriuretic Peptide 570.6;  Hemoglobin 11.2; Platelets 215 11/17/2022: TSH 5.266 11/20/2022: BUN 24; Creatinine, Ser 1.40; Magnesium 1.8; Potassium 4.2; Sodium 146  Recent Lipid Panel    Component Value Date/Time   CHOL 134 09/21/2022 1227   TRIG 74 09/21/2022 1227   HDL 46 09/21/2022 1227   CHOLHDL 2.9 09/21/2022 1227   CHOLHDL 3.2 05/11/2018 0310   VLDL 7 05/11/2018 0310   LDLCALC 73 09/21/2022 1227    History of Present Illness    62 year old female with the above past medical history including chronic systolic heart failure, mitral valve regurgitation, hypertension, CKD stage III, OSA, and obesity.  She has a history of chronic systolic heart failure, EF 35-40%.  She is on home O2.  TEE in 2021 revealed severe mitral valve regurgitation.  She was not thought to be a surgical candidate.  She was last seen in office on 11/08/2022 and was stable from a cardiac standpoint.  She did note some dyspnea on exertion, she denied chest pain.  She was hospitalized from 11/16/2022 to 11/18/2022 in the setting of acute on chronic systolic heart failure.  She presented with respiratory distress and lethargy.  She required BiPAP therapy.  Repeat echocardiogram showed EF 30 to 35%, consistent with prior studies.  She was diuresed with IV Lasix with improvement in her symptoms.  EEG in the setting of altered mental status was negative.  Several of her psych  medications were adjusted due to concern for oversedation.  She was discharged home in stable condition on 11/18/2022.  She presents today for follow-up.  Since her hospitalization  Chronic systolic heart failure: Mitral valve regurgitation: Hypertension: CKD stage III: OSA: Obesity: Disposition:  Home Medications    Current Outpatient Medications  Medication Sig Dispense Refill   acetaminophen (TYLENOL) 500 MG tablet Take 1 tablet (500 mg total) by mouth every 6 (six) hours as needed for headache. 90 tablet 1   allopurinol (ZYLOPRIM) 100 MG tablet TAKE ONE TABLET BY MOUTH ONCE DAILY 30 tablet 6   benztropine (COGENTIN) 0.5 MG tablet Take 0.5 mg by mouth 2 (two) times daily.     buPROPion (WELLBUTRIN XL) 150 MG 24 hr tablet Take 150 mg by mouth every morning.     clonazePAM (KLONOPIN) 0.5 MG tablet Take 0.5 tablets (0.25 mg total) by mouth daily as needed for anxiety. (Patient taking differently: Take 0.25 mg by mouth 2 (two) times daily.) 30 tablet 3   docusate sodium (COLACE) 100 MG capsule Take 1 capsule (100 mg total) by mouth 2 (two) times daily. 60 capsule 2   FLUoxetine (PROZAC) 40 MG capsule Take 1 capsule (40 mg total) by mouth every morning. 90 capsule 1   furosemide (LASIX) 80 MG tablet TAKE ONE TABLET DAILY 90 tablet 0   metoprolol tartrate (LOPRESSOR) 50 MG tablet Take 1 tablet (50 mg total) by mouth 2 (two) times daily. 180 tablet 1   mirtazapine (REMERON) 15 MG tablet Take 1 tablet (15 mg total) by mouth at bedtime. 90 tablet 1   omeprazole (PRILOSEC) 20 MG capsule TAKE ONE CAPSULE DAILY 90 capsule 0   prazosin (MINIPRESS) 1 MG capsule Take 1 mg by mouth at bedtime.     risperiDONE (RISPERDAL) 3 MG tablet Take 3 mg by mouth 3 (three) times daily.     spironolactone (ALDACTONE) 25 MG tablet Take 1 tablet (25 mg total) by mouth daily. 90 tablet 1   traZODone (DESYREL) 50 MG tablet Take 1 tablet (50 mg total) by mouth at bedtime. 90 tablet 1  No current  facility-administered medications for this visit.     Review of Systems    ***.  All other systems reviewed and are otherwise negative except as noted above.    Physical Exam    VS:  LMP 05/08/2013  , BMI There is no height or weight on file to calculate BMI.     GEN: Well nourished, well developed, in no acute distress. HEENT: normal. Neck: Supple, no JVD, carotid bruits, or masses. Cardiac: RRR, no murmurs, rubs, or gallops. No clubbing, cyanosis, edema.  Radials/DP/PT 2+ and equal bilaterally.  Respiratory:  Respirations regular and unlabored, clear to auscultation bilaterally. GI: Soft, nontender, nondistended, BS + x 4. MS: no deformity or atrophy. Skin: warm and dry, no rash. Neuro:  Strength and sensation are intact. Psych: Normal affect.  Accessory Clinical Findings    ECG personally reviewed by me today -    - no acute changes.   Lab Results  Component Value Date   WBC 10.0 11/16/2022   HGB 11.2 (L) 11/16/2022   HCT 33.0 (L) 11/16/2022   MCV 91.1 11/16/2022   PLT 215 11/16/2022   Lab Results  Component Value Date   CREATININE 1.40 (H) 11/20/2022   BUN 24 11/20/2022   NA 146 (H) 11/20/2022   K 4.2 11/20/2022   CL 102 11/20/2022   CO2 28 11/20/2022   Lab Results  Component Value Date   ALT 22 11/16/2022   AST 37 11/16/2022   ALKPHOS 147 (H) 11/16/2022   BILITOT 0.3 11/16/2022   Lab Results  Component Value Date   CHOL 134 09/21/2022   HDL 46 09/21/2022   LDLCALC 73 09/21/2022   TRIG 74 09/21/2022   CHOLHDL 2.9 09/21/2022    Lab Results  Component Value Date   HGBA1C 5.7 (H) 09/21/2022    Assessment & Plan    1.  ***  No BP recorded.  {Refresh Note OR Click here to enter BP  :1}***   Joylene Grapes, NP 11/27/2022, 6:27 AM

## 2022-11-30 ENCOUNTER — Other Ambulatory Visit: Payer: Medicare (Managed Care)

## 2022-12-15 ENCOUNTER — Encounter: Payer: Self-pay | Admitting: Podiatry

## 2022-12-15 ENCOUNTER — Ambulatory Visit (INDEPENDENT_AMBULATORY_CARE_PROVIDER_SITE_OTHER): Payer: MEDICAID | Admitting: Podiatry

## 2022-12-15 DIAGNOSIS — M79675 Pain in left toe(s): Secondary | ICD-10-CM | POA: Diagnosis not present

## 2022-12-15 DIAGNOSIS — G609 Hereditary and idiopathic neuropathy, unspecified: Secondary | ICD-10-CM | POA: Diagnosis not present

## 2022-12-15 DIAGNOSIS — M79674 Pain in right toe(s): Secondary | ICD-10-CM

## 2022-12-15 DIAGNOSIS — B351 Tinea unguium: Secondary | ICD-10-CM

## 2022-12-15 NOTE — Progress Notes (Signed)
This patient returns to my office for at risk foot care.  This patient requires this care by a professional since this patient will be at risk due to having neuropathy.  This patient is unable to cut nails herself since the patient cannot reach her nails.These nails are painful walking and wearing shoes.  This patient presents for at risk foot care today.  General Appearance  Alert, conversant and in no acute stress.  Vascular  Dorsalis pedis and posterior tibial  pulses are palpable  bilaterally.  Capillary return is within normal limits  bilaterally. Temperature is within normal limits  bilaterally.  Neurologic  Senn-Weinstein monofilament wire test within normal limits  bilaterally. Muscle power within normal limits bilaterally.  Nails Thick disfigured discolored nails with subungual debris  from hallux to fifth toes bilaterally. No evidence of bacterial infection or drainage bilaterally.  Orthopedic  No limitations of motion  feet .  No crepitus or effusions noted.  No bony pathology or digital deformities noted.  Skin  normotropic skin with no porokeratosis noted bilaterally.  No signs of infections or ulcers noted.     Onychomycosis  Pain in right toes  Pain in left toes  Consent was obtained for treatment procedures.   Mechanical debridement of nails 1-5  bilaterally performed with a nail nipper.  Filed with dremel without incident. Patient was dispensed orthopedic shoes [id number] and shoe inserts [id number].  The shoes fit well.   Return office visit     4 months                 Told patient to return for periodic foot care and evaluation due to potential at risk complications.   Helane Gunther DPM

## 2022-12-31 ENCOUNTER — Other Ambulatory Visit: Payer: Self-pay | Admitting: Family Medicine

## 2022-12-31 DIAGNOSIS — N3942 Incontinence without sensory awareness: Secondary | ICD-10-CM

## 2022-12-31 NOTE — Progress Notes (Addendum)
Filled out order for incontinence supplies and DMA request and faxed to [facility]. Patient recently on our inpatient service where I was the attending. She will be my continuity patient moving forward. I have reviewed her chart. 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 Will need chronic incontinence supplies.

## 2022-12-31 NOTE — Assessment & Plan Note (Signed)
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 Will need chronic incontinence supplies.

## 2023-01-12 ENCOUNTER — Telehealth: Payer: Self-pay | Admitting: Student

## 2023-01-12 NOTE — Telephone Encounter (Signed)
Patient's sister Jacqualyn Posey came in asking for the doctor to give her a call please, she has some questions about her medication

## 2023-01-16 NOTE — Telephone Encounter (Signed)
Called patient's sister, confirmed identity. Sister is concerned that some medications were discontinued, but she is not sure which ones.  Appears she stopped Losartan after hospitalization, not clear why but she was normotensive on her hospital f/u.  Patient has appointment tomorrow, and sister will bring the list of medications she has questions about. I recommended that sister schedule an appointment for patient to see me, after she has seen Dr. Jennette Kettle tomorrow.

## 2023-01-16 NOTE — Telephone Encounter (Signed)
Routed message to PCP. Lauren Wood, CMA  

## 2023-01-17 ENCOUNTER — Ambulatory Visit (INDEPENDENT_AMBULATORY_CARE_PROVIDER_SITE_OTHER): Payer: Medicare (Managed Care) | Admitting: Family Medicine

## 2023-01-17 ENCOUNTER — Encounter: Payer: Self-pay | Admitting: Family Medicine

## 2023-01-17 VITALS — BP 124/87 | HR 64 | Wt 318.6 lb

## 2023-01-17 DIAGNOSIS — F79 Unspecified intellectual disabilities: Secondary | ICD-10-CM | POA: Diagnosis not present

## 2023-01-17 DIAGNOSIS — N3941 Urge incontinence: Secondary | ICD-10-CM

## 2023-01-17 MED ORDER — ALLOPURINOL 100 MG PO TABS: 100.0000 mg | ORAL_TABLET | Freq: Every day | ORAL | 6 refills | Status: DC

## 2023-01-17 MED ORDER — COLESTIPOL HCL 1 G PO TABS: 1.0000 g | ORAL_TABLET | Freq: Two times a day (BID) | ORAL | 3 refills | Status: DC

## 2023-01-17 NOTE — Patient Instructions (Addendum)
I am restarting your allopurinol for your gout.  I have called in a prescription.  It is best to take that every day.  Regarding the diarrhea, I am adding a medicine called colestipol which she will take 1 g twice daily.  If your diarrhea does not improve in 2 weeks or so, please let my office know and we will try something different.  I noticed on your med list that you are taking docusate sodium 100 mg twice a day.  That is a stool softener and you may not need that as often.  I would stop that for right now and if you get constipated, let me know.  They had also stopped your omeprazole which was for heartburn.  I agree with stopping that as it also can contribute to diarrhea.  I looked through your chart and you had lab work done last month so I do not think we need any of that today.  I would recommend you schedule an appointment with your new provider Dr. Claudean Severance in the next 4 to 6 weeks.  You will like him.  He will take care of your everyday needs.    When it comes to have your next Pap smear which will be at age 62, if you would like a female to do that we will be happy to set that up.  Just let Dr. Claudean Severance know and we will set you up in women's health clinic for your Pap smear.  Notably, the Pap smear at 64 is typically the last 1 you will ever need.  If you have other questions or comments, please let us know.  I would try sending most of my communication to Dr. Claudean Severance but I am also available as well.  It was great to meet you!  I will send in the information and paperwork for your incontinence supplies.  We had to have this face-to-face meeting once a year in order to approve that but I will take care of that today.

## 2023-01-18 NOTE — Progress Notes (Addendum)
    CHIEF COMPLAINT / HPI:  Here for discussion urinary incontinence.Long standing urge incontinence. Not a surgical candidate. Medical necessity for chronic incontinence supplies.  (Sister added info via phone call)   PERTINENT  PMH / PSH: I have reviewed the patient's medications, allergies, past medical and surgical history, smoking status and updated in the EMR as appropriate.   OBJECTIVE:  BP 124/87   Pulse 64   Wt (!) 318 lb 9.6 oz (144.5 kg)   LMP 05/08/2013   SpO2 93%   BMI 49.90 kg/m  Vital signs reviewed. GENERAL: Well-developed, well-nourished, no acute distress. CARDIOVASCULAR: Regular rate and rhythm no murmur gallop or rub LUNGS: Clear to auscultation bilaterally, no rales or wheeze. ABDOMEN: Soft positive bowel sounds MSK: Movement of extremity x 4. Walks w rolling walker.   ASSESSMENT / PLAN:   Urinary incontinence Continued urinary incontinence. Will be lifetime as she is not ideal candidate for any surgical or any other intervention due to co-morbidities,  Will fill out appropriate paperwork.   Denny Levy MD

## 2023-01-18 NOTE — Assessment & Plan Note (Addendum)
Continued urinary incontinence. Will be lifetime as she is not ideal candidate for any surgical or any other intervention due to co-morbidities,  Will fill out appropriate paperwork.

## 2023-01-24 ENCOUNTER — Telehealth: Payer: Self-pay | Admitting: Student

## 2023-01-24 NOTE — Telephone Encounter (Signed)
Routed message to PCP. Lauren Wood, CMA  

## 2023-01-24 NOTE — Telephone Encounter (Signed)
Called Patient's guardian. Confirmed identity. She does not know the name of the medication. Provider recommends sister call back with specific name of medication as I was unable to find the medication on her list by description. Sister will call back with medication name.

## 2023-01-24 NOTE — Telephone Encounter (Signed)
Sister/guardian want to know if the medication patient is taking can be given before 11 p.m because it is hard for her (sister) to stay up that late. Pls call sister [phone].

## 2023-01-24 NOTE — Telephone Encounter (Signed)
Patients sister returns call to nurse line.   She reports the medication is Benztropine Mes 0.5mg . She reports the bottle states to take 2x  per day. However, she was told to give to her once at 11pm each evening.   She reports she usually goes to sleep around 8pm each night and would prefer to give to her sooner.   She would like PCP recommendation on switching time from 11pm to 8pm each evening.   Will forward to PCP.

## 2023-01-25 NOTE — Telephone Encounter (Signed)
Called sister [name], confirmed identity. Discussed it is okay to take Benztropine at 8pm instead of 11 pm. She expressed understanding and agreement with plan.

## 2023-01-31 ENCOUNTER — Other Ambulatory Visit: Payer: Self-pay

## 2023-01-31 MED ORDER — FUROSEMIDE 80 MG PO TABS: 80.0000 mg | ORAL_TABLET | Freq: Every day | ORAL | 0 refills | Status: DC

## 2023-02-01 ENCOUNTER — Telehealth: Payer: Self-pay | Admitting: Cardiology

## 2023-02-01 NOTE — Telephone Encounter (Signed)
Pt's sister calling trying to call to get information on the pt  getting a sleep study done. Please advise

## 2023-02-07 ENCOUNTER — Encounter (HOSPITAL_COMMUNITY): Payer: Self-pay | Admitting: Emergency Medicine

## 2023-02-07 ENCOUNTER — Inpatient Hospital Stay (HOSPITAL_COMMUNITY)
Admission: EM | Admit: 2023-02-07 | Discharge: 2023-02-10 | DRG: 291 | Disposition: A | Discharge status: Home / Self Care | Payer: Medicare (Managed Care) | Attending: Family Medicine | Admitting: Family Medicine

## 2023-02-07 ENCOUNTER — Telehealth: Payer: Self-pay | Admitting: Student

## 2023-02-07 ENCOUNTER — Emergency Department (HOSPITAL_COMMUNITY): Payer: Medicare (Managed Care)

## 2023-02-07 DIAGNOSIS — Z6841 Body Mass Index (BMI) 40.0 and over, adult: Secondary | ICD-10-CM

## 2023-02-07 DIAGNOSIS — J9601 Acute respiratory failure with hypoxia: Secondary | ICD-10-CM | POA: Diagnosis present

## 2023-02-07 DIAGNOSIS — K219 Gastro-esophageal reflux disease without esophagitis: Secondary | ICD-10-CM | POA: Diagnosis present

## 2023-02-07 DIAGNOSIS — I447 Left bundle-branch block, unspecified: Secondary | ICD-10-CM | POA: Diagnosis present

## 2023-02-07 DIAGNOSIS — I130 Hypertensive heart and chronic kidney disease with heart failure and stage 1 through stage 4 chronic kidney disease, or unspecified chronic kidney disease: Secondary | ICD-10-CM | POA: Diagnosis not present

## 2023-02-07 DIAGNOSIS — Z800 Family history of malignant neoplasm of digestive organs: Secondary | ICD-10-CM

## 2023-02-07 DIAGNOSIS — F319 Bipolar disorder, unspecified: Secondary | ICD-10-CM | POA: Diagnosis present

## 2023-02-07 DIAGNOSIS — M109 Gout, unspecified: Secondary | ICD-10-CM | POA: Diagnosis present

## 2023-02-07 DIAGNOSIS — I509 Heart failure, unspecified: Principal | ICD-10-CM

## 2023-02-07 DIAGNOSIS — E662 Morbid (severe) obesity with alveolar hypoventilation: Secondary | ICD-10-CM | POA: Diagnosis present

## 2023-02-07 DIAGNOSIS — I5023 Acute on chronic systolic (congestive) heart failure: Secondary | ICD-10-CM | POA: Diagnosis present

## 2023-02-07 DIAGNOSIS — Z79899 Other long term (current) drug therapy: Secondary | ICD-10-CM

## 2023-02-07 DIAGNOSIS — Z1152 Encounter for screening for COVID-19: Secondary | ICD-10-CM

## 2023-02-07 DIAGNOSIS — F603 Borderline personality disorder: Secondary | ICD-10-CM | POA: Diagnosis present

## 2023-02-07 DIAGNOSIS — Z823 Family history of stroke: Secondary | ICD-10-CM

## 2023-02-07 DIAGNOSIS — F79 Unspecified intellectual disabilities: Secondary | ICD-10-CM | POA: Diagnosis present

## 2023-02-07 DIAGNOSIS — N183 Chronic kidney disease, stage 3 unspecified: Secondary | ICD-10-CM | POA: Diagnosis present

## 2023-02-07 DIAGNOSIS — E785 Hyperlipidemia, unspecified: Secondary | ICD-10-CM | POA: Diagnosis present

## 2023-02-07 DIAGNOSIS — N179 Acute kidney failure, unspecified: Secondary | ICD-10-CM | POA: Diagnosis present

## 2023-02-07 DIAGNOSIS — F419 Anxiety disorder, unspecified: Secondary | ICD-10-CM | POA: Diagnosis present

## 2023-02-07 DIAGNOSIS — H9190 Unspecified hearing loss, unspecified ear: Secondary | ICD-10-CM | POA: Diagnosis present

## 2023-02-07 DIAGNOSIS — Z833 Family history of diabetes mellitus: Secondary | ICD-10-CM

## 2023-02-07 DIAGNOSIS — Z888 Allergy status to other drugs, medicaments and biological substances status: Secondary | ICD-10-CM

## 2023-02-07 DIAGNOSIS — R7989 Other specified abnormal findings of blood chemistry: Secondary | ICD-10-CM | POA: Diagnosis not present

## 2023-02-07 LAB — BASIC METABOLIC PANEL
Anion gap: 9 (ref 5–15)
BUN: 26 mg/dL — ABNORMAL HIGH (ref 8–23)
CO2: 24 mmol/L (ref 22–32)
Calcium: 8.3 mg/dL — ABNORMAL LOW (ref 8.9–10.3)
Chloride: 107 mmol/L (ref 98–111)
Creatinine, Ser: 1.6 mg/dL — ABNORMAL HIGH (ref 0.44–1.00)
GFR, Estimated: 36 mL/min — ABNORMAL LOW (ref >60)
Glucose, Bld: 83 mg/dL (ref 70–99)
Potassium: 3.8 mmol/L (ref 3.5–5.1)
Sodium: 140 mmol/L (ref 135–145)

## 2023-02-07 LAB — CBC
HCT: 36 % (ref 36.0–46.0)
Hemoglobin: 10.5 g/dL — ABNORMAL LOW (ref 12.0–15.0)
MCH: 26.8 pg (ref 26.0–34.0)
MCHC: 29.2 g/dL — ABNORMAL LOW (ref 30.0–36.0)
MCV: 91.8 fL (ref 80.0–100.0)
Platelets: 230 10*3/uL (ref 150–400)
RBC: 3.92 MIL/uL (ref 3.87–5.11)
RDW: 14.8 % (ref 11.5–15.5)
WBC: 10 10*3/uL (ref 4.0–10.5)
nRBC: 0 % (ref 0.0–0.2)

## 2023-02-07 LAB — D-DIMER, QUANTITATIVE: D-Dimer, Quant: 0.5 ug/mL-FEU (ref 0.00–0.50)

## 2023-02-07 LAB — TROPONIN I (HIGH SENSITIVITY)
Troponin I (High Sensitivity): 24 ng/L — ABNORMAL HIGH (ref <18)
Troponin I (High Sensitivity): 20 ng/L — ABNORMAL HIGH (ref <18)

## 2023-02-07 LAB — SARS CORONAVIRUS 2 BY RT PCR: SARS Coronavirus 2 by RT PCR: NEGATIVE

## 2023-02-07 LAB — BRAIN NATRIURETIC PEPTIDE: B Natriuretic Peptide: 701.3 pg/mL — ABNORMAL HIGH (ref 0.0–100.0)

## 2023-02-07 MED ORDER — FUROSEMIDE 10 MG/ML IJ SOLN
80.0000 mg | Freq: Once | INTRAMUSCULAR | Status: AC
  Administered 2023-02-07: 80 mg via INTRAVENOUS
  Filled 2023-02-07: qty 8

## 2023-02-07 NOTE — ED Notes (Signed)
Pt assisted to restroom by this RN and caregiver at bedside.  Pt able to ambulate slowly, but experiencing increased shortness of breath with ambulation.  Upon return to room, pt 85% on room air, however pt normally wears oxygen at baseline.  Bedside commode brought to room to assist with urinary needs given pt recently received Lasix dose.

## 2023-02-07 NOTE — Progress Notes (Signed)
Phone Call with Arlene, care provider for patient  Arlene note's that: Her O2 levels have been dropping for last 3-4 weeks. She's been watching it fall and she puts her on O2 for when she needs it. Her heart doctor took her off O2 and she only needs to use it as needed, as long as O2 ~ 94. Suddenly it started going down.   Also notes that Kaileen head been breathing hard and out of breath. Also been having cough, but denies any fever's or systemic symptoms. Is not on any medications for her HF. Also appreciate that her legs had been swelling more. Went to doctors 2-3 weeks ago, where they were noticed to be swollen.   Sat's got low today and decided to bring her in  PMH CHF Intellectual delay HLD Bipolar depression HTN   Surgery Has had some, but sister unsure what.  Social: Tobacco: Neg Alcohol: Neg Substan: Neg Lives with sister, and 2nd sister with ID  Fam Hx: Brother x 2 - CVA, DM, colon cancer Sister Alene -  DM Father - Colon Cancer  Medications: Bupoprion XL- 150 mg Lasix - 80 mg Allopurinol 100 mg Spironalctone 25 mg Fluoxetine 40 mg Colestipol 1 g Prazosin 1 mg Trazodone 50 mg Mirtazapine 15 mg Clonazepam 0.5 mg prn, usually every other day Risperidone 3 mg Metoprolol 50 mg Benztropine 0.5 mg  Code Status Full Code

## 2023-02-07 NOTE — ED Notes (Signed)
Pt. O2 sat at 91 on RA. Tech gave 2L via [place] and it came up to 97

## 2023-02-07 NOTE — H&P (Shared)
Hospital Admission History and Physical Service Pager: [phone]  Patient name: Lauren Wood Medical record number: [id number] Date of Birth: [birth year] Age: 62 y.o. Gender: female  Primary Care Provider: Tiffany Kocher, DO Consultants: None at this time Code Status: Full Code   Preferred Emergency Contact: [place], [place] (Sister) [phone]   Chief Complaint: Dyspnea  Assessment and Plan: Lauren Wood is a 62 y.o. female with a PMH of  CHF, intellectual disability, hearing loss, OHS, OSA, HTN, GERD, CKD III, Bipolar disorder, and borderline personality disorder, presenting with 2 weeks of increasing dyspnea. Differential for presentation of this includes CHF exacerbation (most likely given hx), PNA (less likley given lack of systemic symptoms), ACS, or PE, (both less likely as Troponins and D-Dimer were negative.) Also considered OHS vs OSA, but less likely given BNP and CXR.   Assessment & Plan Acute on chronic congestive heart failure, unspecified heart failure type (HCC) Pt has been having 2 weeks of increasing dyspnea, hypoxia, and increased leg swelling. Patient arrived in ED with hypoxia and was placed on 2 L [place]. BNP elevated to 701.3 on admission, with CXR showing enlarged cardiac silhouette. EF 30-35 % on 11/17/22, during last admission. Home meds include metoprolol, spironolactone, and lasix.  Patient was given dose of IV lasix 80 mg in ED. Will monitor weights and UOP, and diurese until patient is at dry weight and off of O2.  Unsure etiology of exacerbation, history of medication noncompliance in the past, this is likely the cause however will also check TSH for thyroid dysfunction. - Admit to FMTS, attending Dr Deirdre Priest - Consider redose lasix - Consider repeat echo - Stritct I/O - Daily Weights - Wean O2 as tolerated - AM BMP, CBC, TSH - Heart Healthy Diet Acute hypoxic respiratory failure (HCC) Patient comes in for 3-4 weeks of hypoxia noted at home.  Patient  does not wear oxygen at baseline, but does use oxygen when needed at home. Patient arrived to ED, with hypoxia and was immediately placed on 2 L nasal cannula.  Patient with history of CHF exacerbation, and recent admission in July.  Chest x-ray and BNP both suggestive of CHF exacerbation.  Will diurese patient, continue to monitor, and wean O2 as tolerated. - Wean O2 as tolerated - Continue diuresis Elevated serum creatinine Patient noted to have elevated creatinine on admission.  Creatinine 1.6 today, previously 1.40, 2 months ago. Patient w/ hx of CKD III. Will continue to monitor.  Creatinine likely elevated in setting of hypervolemia.  Plan to diurese and see how creatinine improves. - Continue diuresis - A.m. BMP   Chronic and Stable Problems:  HTN - Continue metoprolol and spirolactone Bipolar Depression - Continue Wellbutrin 150 mg, Fluoxetine 40 mg, Prazosin 1 mg, Risperidone 3 mg, Trazodone 50 mg Anxiety - Clonazepam 0.5 mg prn (usually every other day or so)  FEN/GI: Heart Healthy Diet VTE Prophylaxis: Lovenox  Disposition: Admitted to progressive care service, FMTS  History of Present Illness:  Lauren Wood is a 62 y.o. female presenting 3-4 weeks of hypoxia and dyspnea, with leg swelling.  Her sister is primary caretaker and care for patient at home.  Sister checks oxygen periodically and noted that it had been trending down.  Sister would give supplemental O2 as needed until O2 sats returned. Today, her sister noted that her O2 saturation dropped down into the 80's, and had been continuing to drop. It stayed low today and she decided to bring her to the ED.  In the ED, patient was started on 2 L nasal cannula, and given 80 mg of IV Lasix for diuresis.  Review Of Systems: Per HPI with the following additions:   Pertinent Past Medical History: CHF Intellectual delay HLD Bipolar depression HTN Hearing loss OSA OHS GERD CKD III  Remainder reviewed in history tab.    Pertinent Past Surgical History: Has had surgeries, however sister is unsure of what    Remainder reviewed in history tab.  Pertinent Social History: Tobacco use: No Alcohol use: None Other Substance use: None Lives with her two sisters, one is care taker, the other has intellectual delay as well  Pertinent Family History: Brother x 2 - CVA, DM, colon cancer Sister Alene -  DM Father - Colon Cancer  Remainder reviewed in history tab.   Important Outpatient Medications: Bupoprion XL- 150 mg Lasix - 80 mg Allopurinol 100 mg Spironalctone 25 mg Fluoxetine 40 mg Colestipol 1 g Prazosin 1 mg Trazodone 50 mg Mirtazapine 15 mg Clonazepam 0.5 mg prn, usually every other day Risperidone 3 mg Metoprolol 50 mg Benztropine 0.5 mg Remainder reviewed in medication history.   Objective: BP 111/88   Pulse 76   Temp 99.3 F (37.4 C) (Oral)   Resp (!) 26   Wt (!) 144 kg   LMP 05/08/2013   SpO2 98%   BMI 49.72 kg/m  Exam: General: NAD, Obese, polite, good mood, intellectual delay  Neck: No JVD Cardiovascular: No JVD, +1 pitting edema in bilateral extremities. Respiratory: CTABL, no wheezes, or rhales  Gastrointestinal: Soft, NTTP, non-distended   Labs:  CBC BMET  Recent Labs  Lab 02/07/23 1655  WBC 10.0  HGB 10.5*  HCT 36.0  PLT 230   Recent Labs  Lab 02/07/23 1655  NA 140  K 3.8  CL 107  CO2 24  BUN 26*  CREATININE 1.60*  GLUCOSE 83  CALCIUM 8.3*    Pertinent additional labs Cr 1.60, Hgb 10.5, Trop 24 > 20, D-dimer neg, BNP 701.3 EKG: My own interpretation (not copied from electronic read) sinus rhythm, w/ occasional PVC and LBB (noted on previous EKG).     Imaging Studies Performed:  DG Chest 2 View  Result Date: 02/07/2023 CLINICAL DATA:  Shortness of breath EXAM: CHEST - 2 VIEW COMPARISON:  11/16/2022 FINDINGS: Enlarged cardiomediastinal silhouette with bronchovascular crowding but suspect mild central congestion. No consolidation, pleural  effusion or pneumothorax. IMPRESSION: Enlarged cardiomediastinal silhouette with suspected mild central congestion. Electronically Signed   By: Jasmine Pang M.D.   On: 02/07/2023 18:16      Bess Kinds, MD 02/08/2023, 2:43 AM PGY-3, [facility]  FPTS Intern pager: [phone], text pages welcome Secure chat group [facility] Teaching Service

## 2023-02-07 NOTE — Telephone Encounter (Signed)
**  After Hours/ Emergency Line Call**  Received a call to report that Eber Hong from [employer] (sister)--she states that her sister goes to an adult day care center. They called her and told her that her sister's oxygen levels were dropping to 80s to low 90s and that she is breathing harder. Recommended that patient be taken to emergency department given CHF history and respiratory distress. Sister agreed and will take patient to ED.  Levin Erp, MD PGY-3, [facility] 02/07/2023 1:26 PM

## 2023-02-07 NOTE — ED Provider Notes (Signed)
[place] EMERGENCY DEPARTMENT AT [facility] Provider Note   CSN: [id number] Arrival date & time: 02/07/23  1557     History  Chief Complaint  Patient presents with   Shortness of Breath    Lauren Wood is a 62 y.o. female.  Patient is a 62 year old female with a past medical history of CHF, obesity hypoventilation syndrome on oxygen as needed and intellectual disability presenting to the emergency department with shortness of breath.  The patient is here with her caretaker who states over the last 1 to 2 weeks that she has had increasing shortness of breath.  She states that she initially did not want to see a doctor however today her breathing was more labored than usual which prompted her to come to the ER.  She states that she also noted that her oxygen dropped to the 80s today at home.  She states that normally she just uses oxygen if needed when her oxygen levels drop but does not wear it all the time.  The patient denies any associated chest pain.  She states that she has had a cough productive of yellow mucus.  She denies any fevers, nausea, vomiting or diarrhea, black or bloody stools.  The history is provided by the spouse and a caregiver.  Shortness of Breath      Home Medications Prior to Admission medications   Medication Sig Start Date End Date Taking? Authorizing Provider  acetaminophen (TYLENOL) 500 MG tablet Take 1 tablet (500 mg total) by mouth every 6 (six) hours as needed for headache. 11/20/22   Caro Laroche, DO  allopurinol (ZYLOPRIM) 100 MG tablet Take 1 tablet (100 mg total) by mouth daily. 01/17/23   Nestor Ramp, MD  benztropine (COGENTIN) 0.5 MG tablet Take 0.5 mg by mouth 2 (two) times daily.    [provider]  buPROPion (WELLBUTRIN XL) 150 MG 24 hr tablet Take 150 mg by mouth every morning.    [provider]  clonazePAM (KLONOPIN) 0.5 MG tablet Take 0.5 tablets (0.25 mg total) by mouth daily as needed for  anxiety. Patient taking differently: Take 0.25 mg by mouth 2 (two) times daily. 07/13/20   Cathleen Corti, MD  colestipol (COLESTID) 1 g tablet Take 1 tablet (1 g total) by mouth 2 (two) times daily. 01/17/23   Nestor Ramp, MD  FLUoxetine (PROZAC) 40 MG capsule Take 1 capsule (40 mg total) by mouth every morning. 11/20/22   Caro Laroche, DO  furosemide (LASIX) 80 MG tablet Take 1 tablet (80 mg total) by mouth daily. 01/31/23   Tiffany Kocher, DO  metoprolol tartrate (LOPRESSOR) 50 MG tablet Take 1 tablet (50 mg total) by mouth 2 (two) times daily. 11/20/22   Caro Laroche, DO  mirtazapine (REMERON) 15 MG tablet Take 1 tablet (15 mg total) by mouth at bedtime. 11/20/22   Caro Laroche, DO  prazosin (MINIPRESS) 1 MG capsule Take 1 mg by mouth at bedtime. 10/04/22   [provider]  risperiDONE (RISPERDAL) 3 MG tablet Take 3 mg by mouth 3 (three) times daily. 11/25/20   [provider]  spironolactone (ALDACTONE) 25 MG tablet Take 1 tablet (25 mg total) by mouth daily. 11/20/22   Caro Laroche, DO  traZODone (DESYREL) 50 MG tablet Take 1 tablet (50 mg total) by mouth at bedtime. 11/20/22   Caro Laroche, DO      Allergies    Enalapril maleate, Hctz [hydrochlorothiazide], and Lasix [furosemide]  Review of Systems   Review of Systems  Respiratory:  Positive for shortness of breath.     Physical Exam Updated Vital Signs BP (!) 128/95   Pulse 64   Temp 99.3 F (37.4 C) (Oral)   Resp (!) 28   LMP 05/08/2013   SpO2 98%  Physical Exam Vitals and nursing note reviewed.  Constitutional:      General: She is not in acute distress.    Appearance: She is well-developed. She is obese.  HENT:     Head: Normocephalic and atraumatic.     Mouth/Throat:     Mouth: Mucous membranes are moist.  Eyes:     Extraocular Movements: Extraocular movements intact.  Cardiovascular:     Rate and Rhythm: Normal rate and regular rhythm.  Pulmonary:     Effort: Tachypnea  present. No accessory muscle usage.     Breath sounds: Normal breath sounds.  Abdominal:     Palpations: Abdomen is soft.     Tenderness: There is no abdominal tenderness.  Musculoskeletal:        General: Normal range of motion.     Cervical back: Normal range of motion and neck supple.     Right lower leg: Edema (1+ shins) present.     Left lower leg: Edema (1+ to shins) present.  Skin:    General: Skin is warm and dry.  Neurological:     General: No focal deficit present.     Mental Status: She is alert and oriented to person, place, and time.  Psychiatric:        Mood and Affect: Mood normal.        Behavior: Behavior normal.     ED Results / Procedures / Treatments   Labs (all labs ordered are listed, but only abnormal results are displayed) Labs Reviewed  BASIC METABOLIC PANEL - Abnormal; Notable for the following components:      Result Value   BUN 26 (*)    Creatinine, Ser 1.60 (*)    Calcium 8.3 (*)    GFR, Estimated 36 (*)    All other components within normal limits  CBC - Abnormal; Notable for the following components:   Hemoglobin 10.5 (*)    MCHC 29.2 (*)    All other components within normal limits  BRAIN NATRIURETIC PEPTIDE - Abnormal; Notable for the following components:   B Natriuretic Peptide 701.3 (*)    All other components within normal limits  TROPONIN I (HIGH SENSITIVITY) - Abnormal; Notable for the following components:   Troponin I (High Sensitivity) 24 (*)    All other components within normal limits  SARS CORONAVIRUS 2 BY RT PCR  D-DIMER, QUANTITATIVE  TROPONIN I (HIGH SENSITIVITY)    EKG EKG Interpretation Date/Time:  Wednesday February 07 2023 16:19:23 EDT Ventricular Rate:  68 PR Interval:  190 QRS Duration:  170 QT Interval:  444 QTC Calculation: 472 R Axis:   -50  Text Interpretation: Sinus rhythm with occasional Premature ventricular complexes Left axis deviation Left bundle branch block Abnormal ECG Interpretation limited  secondary to artifact Confirmed by Elayne Snare ([id number]) on 02/07/2023 4:33:40 PM  Radiology DG Chest 2 View  Result Date: 02/07/2023 CLINICAL DATA:  Shortness of breath EXAM: CHEST - 2 VIEW COMPARISON:  11/16/2022 FINDINGS: Enlarged cardiomediastinal silhouette with bronchovascular crowding but suspect mild central congestion. No consolidation, pleural effusion or pneumothorax. IMPRESSION: Enlarged cardiomediastinal silhouette with suspected mild central congestion. Electronically Signed   By: Adrian Prows.D.  On: 02/07/2023 18:16    Procedures Procedures    Medications Ordered in ED Medications  furosemide (LASIX) injection 80 mg (80 mg Intravenous Given 02/07/23 1858)    ED Course/ Medical Decision Making/ A&P Clinical Course as of 02/07/23 1911  Wed Feb 07, 2023  1753 Mild increased Cr from baseline. Mildly elevated troponin though does appear to be chronically elevated at 24. [VK]  1816 D-dimer negative making PE unlikely. [VK]  1841 BNP elevated with congestion on CXR consistent with CHF exacerbation. 80 mg IV lasix ordered. [VK]  1903 Patient unable to do activity without shortness and breath and will be admitted for diuresis. [VK]    Clinical Course User Index [VK] Rexford Maus, DO                                 Medical Decision Making This patient presents to the ED with chief complaint(s) of shortness of breath with pertinent past medical history of obesity hypoventilation on O2 as needed, CHF, intellectual disability which further complicates the presenting complaint. The complaint involves an extensive differential diagnosis and also carries with it a high risk of complications and morbidity.    The differential diagnosis includes ACS, arrhythmia, anemia, pneumonia, pneumothorax, pulmonary edema, pleural effusion, viral syndrome  Additional history obtained: Additional history obtained from family Records reviewed Primary Care Documents  ED Course and  Reassessment: On patient's arrival she is mildly tachypneic satting well on 2 L nasal cannula without increased work of breathing.  She had EKG on arrival that showed normal sinus rhythm without acute ischemic changes.  12 labs including troponin, D-dimer and BNP as well as COVID swab and chest x-ray and will be closely reassessed.  Independent labs interpretation:  The following labs were independently interpreted: Elevated BNP, mildly elevated troponin  Independent visualization of imaging: - I independently visualized the following imaging with scope of interpretation limited to determining acute life threatening conditions related to emergency care: Chest x-ray, which revealed vascular congestion  Consultation: - Consulted or discussed management/test interpretation w/ external professional: Hospitalist  Consideration for admission or further workup: Patient requires admission for CHF exacerbation Social Determinants of health: N/A    Amount and/or Complexity of Data Reviewed Labs: ordered. Radiology: ordered.  Risk Prescription drug management. Decision regarding hospitalization.          Final Clinical Impression(s) / ED Diagnoses Final diagnoses:  Acute on chronic congestive heart failure, unspecified heart failure type [facility])    Rx / DC Orders ED Discharge Orders     None         Rexford Maus, DO 02/07/23 1911

## 2023-02-07 NOTE — ED Triage Notes (Signed)
Pt here from home with c/o sob and low sats 87 % on room air , pt has prn in O2 at home , pt placed on 2 liters in triage , denis cp

## 2023-02-07 NOTE — Assessment & Plan Note (Signed)
Pt has been having 2 weeks of increasing dyspnea. Status of her home CHF medications unknown. - Admit to FMTS, attending Dr Deirdre Priest -

## 2023-02-07 NOTE — Telephone Encounter (Signed)
**  After Hours/ Emergency Line Call**  Received a call to report from [employer].  Attempted calling x 4 times no answer. Left VM to return call if an emergency is present.  Levin Erp, MD PGY-3, [facility] 02/07/2023 1:18 PM

## 2023-02-08 ENCOUNTER — Encounter (HOSPITAL_COMMUNITY): Payer: Self-pay

## 2023-02-08 DIAGNOSIS — J9601 Acute respiratory failure with hypoxia: Secondary | ICD-10-CM | POA: Diagnosis present

## 2023-02-08 DIAGNOSIS — Z800 Family history of malignant neoplasm of digestive organs: Secondary | ICD-10-CM | POA: Diagnosis not present

## 2023-02-08 DIAGNOSIS — K219 Gastro-esophageal reflux disease without esophagitis: Secondary | ICD-10-CM | POA: Diagnosis present

## 2023-02-08 DIAGNOSIS — E785 Hyperlipidemia, unspecified: Secondary | ICD-10-CM | POA: Diagnosis present

## 2023-02-08 DIAGNOSIS — Z1152 Encounter for screening for COVID-19: Secondary | ICD-10-CM | POA: Diagnosis not present

## 2023-02-08 DIAGNOSIS — I5023 Acute on chronic systolic (congestive) heart failure: Secondary | ICD-10-CM | POA: Diagnosis present

## 2023-02-08 DIAGNOSIS — I447 Left bundle-branch block, unspecified: Secondary | ICD-10-CM | POA: Diagnosis present

## 2023-02-08 DIAGNOSIS — F419 Anxiety disorder, unspecified: Secondary | ICD-10-CM | POA: Diagnosis present

## 2023-02-08 DIAGNOSIS — N183 Chronic kidney disease, stage 3 unspecified: Secondary | ICD-10-CM | POA: Diagnosis present

## 2023-02-08 DIAGNOSIS — H9190 Unspecified hearing loss, unspecified ear: Secondary | ICD-10-CM | POA: Diagnosis present

## 2023-02-08 DIAGNOSIS — Z6841 Body Mass Index (BMI) 40.0 and over, adult: Secondary | ICD-10-CM | POA: Diagnosis not present

## 2023-02-08 DIAGNOSIS — I5043 Acute on chronic combined systolic (congestive) and diastolic (congestive) heart failure: Secondary | ICD-10-CM | POA: Diagnosis not present

## 2023-02-08 DIAGNOSIS — F319 Bipolar disorder, unspecified: Secondary | ICD-10-CM | POA: Diagnosis present

## 2023-02-08 DIAGNOSIS — I509 Heart failure, unspecified: Secondary | ICD-10-CM | POA: Diagnosis not present

## 2023-02-08 DIAGNOSIS — E662 Morbid (severe) obesity with alveolar hypoventilation: Secondary | ICD-10-CM | POA: Diagnosis present

## 2023-02-08 DIAGNOSIS — M109 Gout, unspecified: Secondary | ICD-10-CM | POA: Diagnosis present

## 2023-02-08 DIAGNOSIS — R7989 Other specified abnormal findings of blood chemistry: Secondary | ICD-10-CM | POA: Diagnosis present

## 2023-02-08 DIAGNOSIS — I130 Hypertensive heart and chronic kidney disease with heart failure and stage 1 through stage 4 chronic kidney disease, or unspecified chronic kidney disease: Secondary | ICD-10-CM | POA: Diagnosis present

## 2023-02-08 DIAGNOSIS — Z833 Family history of diabetes mellitus: Secondary | ICD-10-CM | POA: Diagnosis not present

## 2023-02-08 DIAGNOSIS — Z79899 Other long term (current) drug therapy: Secondary | ICD-10-CM | POA: Diagnosis not present

## 2023-02-08 DIAGNOSIS — N179 Acute kidney failure, unspecified: Secondary | ICD-10-CM | POA: Diagnosis present

## 2023-02-08 DIAGNOSIS — F603 Borderline personality disorder: Secondary | ICD-10-CM | POA: Diagnosis present

## 2023-02-08 DIAGNOSIS — F79 Unspecified intellectual disabilities: Secondary | ICD-10-CM | POA: Diagnosis present

## 2023-02-08 DIAGNOSIS — Z888 Allergy status to other drugs, medicaments and biological substances status: Secondary | ICD-10-CM | POA: Diagnosis not present

## 2023-02-08 DIAGNOSIS — Z823 Family history of stroke: Secondary | ICD-10-CM | POA: Diagnosis not present

## 2023-02-08 LAB — BASIC METABOLIC PANEL
Anion gap: 12 (ref 5–15)
BUN: 21 mg/dL (ref 8–23)
CO2: 26 mmol/L (ref 22–32)
Calcium: 8.4 mg/dL — ABNORMAL LOW (ref 8.9–10.3)
Chloride: 103 mmol/L (ref 98–111)
Creatinine, Ser: 1.4 mg/dL — ABNORMAL HIGH (ref 0.44–1.00)
GFR, Estimated: 43 mL/min — ABNORMAL LOW (ref >60)
Glucose, Bld: 86 mg/dL (ref 70–99)
Potassium: 3.4 mmol/L — ABNORMAL LOW (ref 3.5–5.1)
Sodium: 141 mmol/L (ref 135–145)

## 2023-02-08 LAB — CBC
HCT: 36.6 % (ref 36.0–46.0)
Hemoglobin: 10.7 g/dL — ABNORMAL LOW (ref 12.0–15.0)
MCH: 26.8 pg (ref 26.0–34.0)
MCHC: 29.2 g/dL — ABNORMAL LOW (ref 30.0–36.0)
MCV: 91.5 fL (ref 80.0–100.0)
Platelets: 208 10*3/uL (ref 150–400)
RBC: 4 MIL/uL (ref 3.87–5.11)
RDW: 14.8 % (ref 11.5–15.5)
WBC: 9 10*3/uL (ref 4.0–10.5)
nRBC: 0 % (ref 0.0–0.2)

## 2023-02-08 LAB — TSH: TSH: 4.827 u[IU]/mL — ABNORMAL HIGH (ref 0.350–4.500)

## 2023-02-08 LAB — MAGNESIUM: Magnesium: 1.8 mg/dL (ref 1.7–2.4)

## 2023-02-08 MED ORDER — CLONAZEPAM 0.5 MG PO TABS: 0.5000 mg | ORAL_TABLET | Freq: Two times a day (BID) | ORAL | Status: DC | PRN

## 2023-02-08 MED ORDER — FUROSEMIDE 40 MG PO TABS: 80.0000 mg | ORAL_TABLET | Freq: Every day | ORAL | Status: DC

## 2023-02-08 MED ORDER — MIRTAZAPINE 15 MG PO TABS
15.0000 mg | ORAL_TABLET | Freq: Every day | ORAL | Status: DC
  Administered 2023-02-08 – 2023-02-09 (×3): 15 mg via ORAL
  Filled 2023-02-08 (×3): qty 1

## 2023-02-08 MED ORDER — ALLOPURINOL 100 MG PO TABS
100.0000 mg | ORAL_TABLET | Freq: Every day | ORAL | Status: DC
  Administered 2023-02-08 – 2023-02-10 (×3): 100 mg via ORAL
  Filled 2023-02-08 (×3): qty 1

## 2023-02-08 MED ORDER — ENOXAPARIN SODIUM 40 MG/0.4ML IJ SOSY
40.0000 mg | PREFILLED_SYRINGE | INTRAMUSCULAR | Status: DC
  Administered 2023-02-08: 40 mg via SUBCUTANEOUS
  Filled 2023-02-08: qty 0.4

## 2023-02-08 MED ORDER — FUROSEMIDE 10 MG/ML IJ SOLN
80.0000 mg | Freq: Once | INTRAMUSCULAR | Status: AC
  Administered 2023-02-08: 80 mg via INTRAVENOUS
  Filled 2023-02-08: qty 8

## 2023-02-08 MED ORDER — BENZTROPINE MESYLATE 0.5 MG PO TABS
0.5000 mg | ORAL_TABLET | Freq: Two times a day (BID) | ORAL | Status: DC
  Administered 2023-02-08 – 2023-02-10 (×6): 0.5 mg via ORAL
  Filled 2023-02-08 (×7): qty 1

## 2023-02-08 MED ORDER — FLUOXETINE HCL 20 MG PO CAPS
40.0000 mg | ORAL_CAPSULE | Freq: Every morning | ORAL | Status: DC
  Administered 2023-02-08 – 2023-02-10 (×3): 40 mg via ORAL
  Filled 2023-02-08 (×3): qty 2

## 2023-02-08 MED ORDER — BUPROPION HCL ER (XL) 150 MG PO TB24
150.0000 mg | ORAL_TABLET | Freq: Every morning | ORAL | Status: DC
  Administered 2023-02-08 – 2023-02-10 (×3): 150 mg via ORAL
  Filled 2023-02-08 (×3): qty 1

## 2023-02-08 MED ORDER — COLESTIPOL HCL 1 G PO TABS
1.0000 g | ORAL_TABLET | Freq: Two times a day (BID) | ORAL | Status: DC
  Administered 2023-02-08 – 2023-02-10 (×6): 1 g via ORAL
  Filled 2023-02-08 (×7): qty 1

## 2023-02-08 MED ORDER — SPIRONOLACTONE 25 MG PO TABS
25.0000 mg | ORAL_TABLET | Freq: Every day | ORAL | Status: DC
  Administered 2023-02-08 – 2023-02-10 (×3): 25 mg via ORAL
  Filled 2023-02-08 (×3): qty 1

## 2023-02-08 MED ORDER — POTASSIUM CHLORIDE 20 MEQ PO PACK
40.0000 meq | PACK | Freq: Once | ORAL | Status: AC
  Administered 2023-02-08: 40 meq via ORAL
  Filled 2023-02-08: qty 2

## 2023-02-08 MED ORDER — RISPERIDONE 3 MG PO TABS
3.0000 mg | ORAL_TABLET | Freq: Three times a day (TID) | ORAL | Status: DC
  Administered 2023-02-08 – 2023-02-10 (×8): 3 mg via ORAL
  Filled 2023-02-08 (×9): qty 1

## 2023-02-08 MED ORDER — PRAZOSIN HCL 1 MG PO CAPS
1.0000 mg | ORAL_CAPSULE | Freq: Every day | ORAL | Status: DC
  Administered 2023-02-08 – 2023-02-09 (×3): 1 mg via ORAL
  Filled 2023-02-08 (×4): qty 1

## 2023-02-08 MED ORDER — TRAZODONE HCL 50 MG PO TABS
50.0000 mg | ORAL_TABLET | Freq: Every day | ORAL | Status: DC
  Administered 2023-02-08 – 2023-02-09 (×3): 50 mg via ORAL
  Filled 2023-02-08 (×3): qty 1

## 2023-02-08 MED ORDER — METOPROLOL TARTRATE 50 MG PO TABS
50.0000 mg | ORAL_TABLET | Freq: Two times a day (BID) | ORAL | Status: DC
  Administered 2023-02-08 – 2023-02-10 (×6): 50 mg via ORAL
  Filled 2023-02-08: qty 1
  Filled 2023-02-08: qty 2
  Filled 2023-02-08 (×4): qty 1

## 2023-02-08 MED ORDER — MAGNESIUM SULFATE 2 GM/50ML IV SOLN
2.0000 g | Freq: Once | INTRAVENOUS | Status: AC
  Administered 2023-02-08: 2 g via INTRAVENOUS
  Filled 2023-02-08: qty 50

## 2023-02-08 MED ORDER — ENOXAPARIN SODIUM 80 MG/0.8ML IJ SOSY
70.0000 mg | PREFILLED_SYRINGE | INTRAMUSCULAR | Status: DC
  Administered 2023-02-09 – 2023-02-10 (×2): 70 mg via SUBCUTANEOUS
  Filled 2023-02-08 (×2): qty 0.8

## 2023-02-08 NOTE — Progress Notes (Signed)
Patient's sister Samanthagrace Corsiglia called for an update. RN gave update about current plan of care and patient's current status. Patient's sister verbalize understanding. RN made sister to call anytime for questions.

## 2023-02-08 NOTE — Assessment & Plan Note (Addendum)
Patient comes in for 3-4 weeks of hypoxia noted at home.  Uses O2 PRN at home, will confirm with sister how much O2 she is on.  Will diurese patient, continue to monitor, and wean O2 as tolerated. - Wean O2 as tolerated - Continue diuresis

## 2023-02-08 NOTE — ED Notes (Signed)
ED TO INPATIENT HANDOFF REPORT  ED Nurse Name and Phone #: Gillis Ends [phone]  S Name/Age/Gender Lauren Wood 62 y.o. female Room/Bed: 002C/002C  Code Status   Code Status: Full Code  Home/SNF/Other Home Patient oriented to: self and place Is this baseline? Yes   Triage Complete: Triage complete  Chief Complaint Acute on chronic congestive heart failure (HCC) [I50.9]  Triage Note Pt here from home with c/o sob and low sats 87 % on room air , pt has prn in O2 at home , pt placed on 2 liters in triage , denis cp    Allergies Allergies  Allergen Reactions   Enalapril Maleate Other (See Comments)    Unknown reaction - per family   Vasotec    Hctz [Hydrochlorothiazide] Other (See Comments)    Hyperuricemia with Lasix    Lasix [Furosemide] Other (See Comments)    Hyperuricemia with HCTZ      Level of Care/Admitting Diagnosis ED Disposition     ED Disposition  Admit   Condition  --   Comment  Hospital Area: [facility] [[id number]]  Level of Care: Progressive [102]  Admit to Progressive based on following criteria: CARDIOVASCULAR & THORACIC of moderate stability with acute coronary syndrome symptoms/low risk myocardial infarction/hypertensive urgency/arrhythmias/heart failure potentially compromising stability and stable post cardiovascular intervention patients.  May admit [facility] to [facility] or [facility] if equivalent level of care is available:: No  Covid Evaluation: Asymptomatic - no recent exposure (last 10 days) testing not required  Diagnosis: Acute on chronic congestive heart failure [facility]) [[id number]]  Admitting Physician: Tommy Rainwater Lovey Newcomer [[id number]]  Attending Physician: Carney Living [phone]  Certification:: I certify this patient will need inpatient services for at least 2 midnights  Expected Medical Readiness: 02/10/2023          B Medical/Surgery History Past Medical History:  Diagnosis Date   ALLERGIC RHINITIS  08/02/2009   Arthritis    Boil of vulva 11/07/2012   Borderline personality disorder (HCC) 05/31/2011   CKD (chronic kidney disease) stage 3, GFR 30-59 ml/min (HCC)    chronic   Gout of foot 09/26/2013   L foot 09/2013. In the setting of HCTZ 25 mg and lasix 40 mg daily. Stopped HCTZ after decreasing to 12.5 mg. Treated with colchicine.     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      PRESSURE ULCER OTHER SITE 10/01/2009   hx of   Sleep apnea    uses cpap, setting of 2   Unspecified psychosis 12/20/2009   Past Surgical History:  Procedure Laterality Date   COLONOSCOPY N/A 06/24/2013   Procedure: COLONOSCOPY;  Surgeon: Iva Boop, MD;  Location: [facility] ENDOSCOPY;  Service: Endoscopy;  Laterality: N/A;   COLONOSCOPY WITH PROPOFOL N/A 10/05/2015   Procedure: COLONOSCOPY WITH PROPOFOL;  Surgeon: Iva Boop, MD;  Location: [facility]OS[facility];  Service: Endoscopy;  Laterality: N/A;   COLONOSCOPY WITH PROPOFOL N/A 09/26/2021   Procedure: COLONOSCOPY WITH PROPOFOL;  Surgeon: Iva Boop, MD;  Location: [facility]OS[facility];  Service: Gastroenterology;  Laterality: N/A;   cyst removed  2011  forehead   TEE WITHOUT CARDIOVERSION N/A 03/19/2020   Procedure: TRANSESOPHAGEAL ECHOCARDIOGRAM (TEE);  Surgeon: Lewayne Bunting, MD;  Location: [facility] END[facility];  Service: Cardiovascular;  Laterality: N/A;   TYMPANOSTOMY TUBE PLACEMENT Bilateral yrs ago     A IV Location/Drains/Wounds Patient Lines/Drains/Airways Status     Active Line/Drains/Airways     Name Placement date Placement time Site Days   Peripheral IV 02/07/23 20 G Left Antecubital 02/07/23  1654  Antecubital  1   External Urinary Catheter 02/07/23  2049  --  1             Intake/Output Last 24 hours No intake or output data in the 24 hours ending 02/08/23 0248  Labs/Imaging Results for orders placed or performed during the hospital encounter of 02/07/23 (from the past 48 hour(s))  Basic metabolic panel     Status: Abnormal   Collection Time: 02/07/23  4:55 PM  Result Value Ref Range   Sodium 140 135 - 145 mmol/L   Potassium 3.8 3.5 - 5.1 mmol/L   Chloride 107 98 - 111 mmol/L   CO2 24 22 - 32 mmol/L   Glucose, Bld 83 70 - 99 mg/dL    Comment: Glucose reference range applies only to samples taken after fasting for at least 8 hours.   BUN 26 (H) 8 - 23 mg/dL   Creatinine, Ser 2.69 (H) 0.44 - 1.00 mg/dL   Calcium 8.3 (L) 8.9 - 10.3 mg/dL   GFR, Estimated 36 (L) >60 mL/min    Comment: (NOTE) Calculated using the CKD-EPI Creatinine Equation (2021)    Anion gap 9 5 - 15    Comment: Performed at [facility], [address], [place], [place] [address]  CBC     Status: Abnormal   Collection Time: 02/07/23  4:55 PM  Result Value Ref Range   WBC 10.0 4.0 - 10.5 K/uL   RBC 3.92 3.87 - 5.11 MIL/uL   Hemoglobin 10.5 (L) 12.0 - 15.0 g/dL   HCT 27.0 35.0 - 09.3 %   MCV 91.8 80.0 - 100.0 fL   MCH 26.8 26.0 - 34.0 pg   MCHC 29.2 (L) 30.0 - 36.0 g/dL   RDW 81.8 29.9 - 37.1 %   Platelets 230 150 - 400 K/uL   nRBC 0.0 0.0 - 0.2 %    Comment: Performed at [facility], [address] [place], [place] [address]  Troponin I (High Sensitivity)     Status: Abnormal   Collection Time: 02/07/23  4:55 PM  Result Value Ref Range   Troponin I (High Sensitivity) 24 (H) <18 ng/L    Comment: (NOTE) Elevated high sensitivity troponin I (hsTnI) values and significant  changes across serial measurements may suggest ACS but many other  chronic and acute conditions are known to elevate hsTnI results.  Refer to the "Links" section for chest pain algorithms and additional  guidance. Performed at [facility], [address],  [place], [place] [address]   Brain natriuretic peptide     Status: Abnormal   Collection Time: 02/07/23  4:55 PM  Result Value Ref Range   B Natriuretic Peptide 701.3 (H) 0.0 - 100.0 pg/mL    Comment: Performed at [facility], [address] [place], [place] [address]  D-dimer, quantitative     Status: None   Collection Time: 02/07/23  4:55 PM  Result Value Ref Range   D-Dimer, Quant 0.50 0.00 - 0.50 ug/mL-FEU  Comment: (NOTE) At the manufacturer cut-off value of 0.5 g/mL FEU, this assay has a negative predictive value of 95-100%.This assay is intended for use in conjunction with a clinical pretest probability (PTP) assessment model to exclude pulmonary embolism (PE) and deep venous thrombosis (DVT) in outpatients suspected of PE or DVT. Results should be correlated with clinical presentation. Performed at [facility], [address], [place], [place] [address]   SARS Coronavirus 2 by RT PCR (hospital order, performed in [facility] hospital lab) *cepheid single result test* Anterior Nasal Swab     Status: None   Collection Time: 02/07/23  5:27 PM   Specimen: Anterior Nasal Swab  Result Value Ref Range   SARS Coronavirus 2 by RT PCR NEGATIVE NEGATIVE    Comment: Performed at [facility], [address], [place], [place] [address]  Troponin I (High Sensitivity)     Status: Abnormal   Collection Time: 02/07/23  6:39 PM  Result Value Ref Range   Troponin I (High Sensitivity) 20 (H) <18 ng/L    Comment: (NOTE) Elevated high sensitivity troponin I (hsTnI) values and significant  changes across serial measurements may suggest ACS but many other  chronic and acute conditions are known to elevate hsTnI results.  Refer to the "Links" section for chest pain algorithms and additional  guidance. Performed at [facility], [address], [place], [place] [address]    DG Chest 2 View  Result Date: 02/07/2023 CLINICAL DATA:  Shortness of breath EXAM: CHEST - 2  VIEW COMPARISON:  11/16/2022 FINDINGS: Enlarged cardiomediastinal silhouette with bronchovascular crowding but suspect mild central congestion. No consolidation, pleural effusion or pneumothorax. IMPRESSION: Enlarged cardiomediastinal silhouette with suspected mild central congestion. Electronically Signed   By: Jasmine Pang M.D.   On: 02/07/2023 18:16    Pending Labs Unresulted Labs (From admission, onward)     Start     Ordered   02/08/23 0500  CBC  Tomorrow morning,   R        02/08/23 0013   02/08/23 0500  Basic metabolic panel  Tomorrow morning,   R        02/08/23 0013   02/08/23 0012  TSH  Once,   R        02/08/23 0013            Vitals/Pain Today's Vitals   02/08/23 0000 02/08/23 0015 02/08/23 0100 02/08/23 0113  BP: 112/78  105/77 111/88  Pulse: 67  (!) 44 76  Resp: (!) 26  (!) 26   Temp:      TempSrc:      SpO2: 96%  98%   Weight:  (!) 144 kg    PainSc:        Isolation Precautions No active isolations  Medications Medications  allopurinol (ZYLOPRIM) tablet 100 mg (has no administration in time range)  colestipol (COLESTID) tablet 1 g (1 g Oral Given 02/08/23 0129)  furosemide (LASIX) tablet 80 mg (has no administration in time range)  metoprolol tartrate (LOPRESSOR) tablet 50 mg (50 mg Oral Given 02/08/23 0113)  prazosin (MINIPRESS) capsule 1 mg (1 mg Oral Given 02/08/23 0128)  spironolactone (ALDACTONE) tablet 25 mg (has no administration in time range)  buPROPion (WELLBUTRIN XL) 24 hr tablet 150 mg (has no administration in time range)  FLUoxetine (PROZAC) capsule 40 mg (has no administration in time range)  mirtazapine (REMERON) tablet 15 mg (15 mg Oral Given 02/08/23 0113)  risperiDONE (RISPERDAL) tablet 3  mg (has no administration in time range)  traZODone (DESYREL) tablet 50 mg (50 mg Oral Given 02/08/23 0113)  benztropine (COGENTIN) tablet 0.5 mg (0.5 mg Oral Given 02/08/23 0129)  clonazePAM (KLONOPIN) tablet 0.5 mg (has no administration in time range)   enoxaparin (LOVENOX) injection 40 mg (has no administration in time range)  furosemide (LASIX) injection 80 mg (80 mg Intravenous Given 02/07/23 1858)    Mobility walks with device     Focused Assessments     R Recommendations: See Admitting Provider Note  Report given to:   Additional Notes:

## 2023-02-08 NOTE — Plan of Care (Signed)
  Problem: Education: Goal: Knowledge of General Education information will improve Description: Including pain rating scale, medication(s)/side effects and non-pharmacologic comfort measures Outcome: Progressing   Problem: Clinical Measurements: Goal: Ability to maintain clinical measurements within normal limits will improve Outcome: Progressing Goal: Will remain free from infection Outcome: Progressing   

## 2023-02-08 NOTE — Assessment & Plan Note (Addendum)
Patient noted to have elevated creatinine on admission. Creatinine 1.6 on admission but improved to 1.4 this morning, appears her baseline is around 1.3. Patient w/ hx of CKD III. Creatinine likely elevated initially in setting of hypervolemia.   - Continue diuresis - A.m. BMP

## 2023-02-08 NOTE — TOC Initial Note (Deleted)
Transition of Care [facility]) - Initial/Assessment Note    Patient Details  Name: Lauren Wood MRN: [id number] Date of Birth: 1960/07/03  Transition of Care [facility]) CM/SW Contact:    Leone Haven, RN Phone Number: 02/08/2023, 3:56 PM  Clinical Narrative:                 From home with sister, has PCP and insurance on file, she gave this NCM permission to call her sister , Cira Rue.  Arlene states has no HH services in place at this time, she has walker, home oxygen 2 liters with adapt.  States she will transport her  home at [employer] and she is support system, states gets medications from [facility] and [facility].  Pta  ambulatory with walker. She also goes to an [facility] at [facility] from 9 am to 3 or 4 pm Mon thru Friday.   Expected Discharge Plan: Home/Self Care Barriers to Discharge: Continued Medical Work up   Patient Goals and CMS Choice Patient states their goals for this hospitalization and ongoing recovery are:: return home with sister   Choice offered to / list presented to : NA      Expected Discharge Plan and Services In-house Referral: NA Discharge Planning Services: CM Consult Post Acute Care Choice: NA Living arrangements for the past 2 months: Apartment                 DME Arranged: N/A DME Agency: NA       HH Arranged: NA          Prior Living Arrangements/Services Living arrangements for the past 2 months: Apartment Lives with:: Siblings (sister Arlene) Patient language and need for interpreter reviewed:: Yes Do you feel safe going back to the place where you live?: Yes      Need for Family Participation in Patient Care: Yes (Comment) Care giver support system in place?: Yes (comment) Current home services: DME (walker, home oxygen 2 liters with adapt) Criminal Activity/Legal Involvement Pertinent to Current Situation/Hospitalization: No - Comment as needed  Activities of Daily Living   ADL Screening (condition at time of  admission) Does the patient have a NEW difficulty with bathing/dressing/toileting/self-feeding that is expected to last >3 days?: No Does the patient have a NEW difficulty with getting in/out of bed, walking, or climbing stairs that is expected to last >3 days?: No Does the patient have a NEW difficulty with communication that is expected to last >3 days?: No Is the patient deaf or have difficulty hearing?: Yes Does the patient have difficulty seeing, even when wearing glasses/contacts?: No Does the patient have difficulty concentrating, remembering, or making decisions?: No  Permission Sought/Granted Permission sought to share information with : Case Manager Permission granted to share information with : Yes, Verbal Permission Granted  Share Information with NAME: Arlene     Permission granted to share info w Relationship: sister     Emotional Assessment Appearance:: Appears stated age Attitude/Demeanor/Rapport: Engaged Affect (typically observed): Appropriate Orientation: : Oriented to Self, Oriented to Place, Oriented to  Time, Oriented to Situation Alcohol / Substance Use: Not Applicable Psych Involvement: No (comment)  Admission diagnosis:  Elevated serum creatinine [R79.89] Acute on chronic congestive heart failure (HCC) [I50.9] Acute on chronic congestive heart failure, unspecified heart failure type (HCC) [I50.9] Acute hypoxic respiratory failure (HCC) [J96.01] Patient Active Problem List   Diagnosis Date Noted   Acute on chronic congestive heart failure (HCC) 02/08/2023   Elevated serum creatinine 02/08/2023  Nonrheumatic mitral valve regurgitation    Acute hypoxic respiratory failure (HCC) 03/16/2020   Pulmonary nodules 01/26/2019   Eustachian tube dysfunction, left 01/01/2019   Obesity hypoventilation syndrome (HCC)    Eustachian tube dysfunction, bilateral 12/07/2017   Diverticulosis of colon 11/22/2016   Myringotomy tube status 02/11/2016   Bilateral impacted  cerumen 07/07/2015   Mixed conductive and sensorineural hearing loss of right ear with restricted hearing of left ear 07/07/2015   Urinary incontinence 12/24/2014   History of colonic polyps 06/24/2013   Health care maintenance 02/11/2013   Sensorineural hearing loss, bilateral 02/11/2013   OSA (obstructive sleep apnea) 12/11/2012   Obstructive sleep apnea syndrome 12/11/2012   Borderline personality disorder (HCC) 05/31/2011   Chronic kidney disease, stage III (moderate) (HCC) 02/06/2011   Congestive heart failure (HCC) 02/06/2011   BMI 50.0-59.9, adult (HCC) 08/02/2009   Benign essential hypertension 08/02/2009   Gastroesophageal reflux disease 08/02/2009   Intellectual disability 08/02/2009   Dyssomnia 08/02/2009   PCP:  Tiffany Kocher, DO Pharmacy:   [facility] - [place], [place] - [address] [place] [address] Phone: [phone] Fax: [phone]  [id number] [place], [place] - [address] [facility] [address] [place] [address] Phone: [phone] Fax: [phone]  [facility] [address] [place] [address] Phone: [phone] Fax: [phone]     Social Determinants of Health (SDOH) Social History: SDOH Screenings   Food Insecurity: Low Risk  (11/30/2022)   Received from [facility]  Housing: Low Risk  (11/21/2022)  Utilities: Low Risk  (11/30/2022)   Received from [facility]  Depression (PHQ2-9): Low Risk  (11/20/2022)  Tobacco Use: Low Risk  (02/07/2023)   SDOH Interventions:     Readmission Risk Interventions    11/17/2022    1:43 PM  Readmission Risk Prevention Plan  Post Dischage Appt Complete  Medication Screening Complete  Transportation Screening Complete

## 2023-02-08 NOTE — Assessment & Plan Note (Addendum)
Patient comes in for 3-4 weeks of hypoxia noted at home.  Patient does not wear oxygen at baseline, but does use oxygen when needed at home. Patient arrived to ED, with hypoxia and was immediately placed on 2 L nasal cannula.  Patient with history of CHF exacerbation, and recent admission in July.  Chest x-ray and BNP both suggestive of CHF exacerbation.  Will diurese patient, continue to monitor, and wean O2 as tolerated. - Wean O2 as tolerated - Continue diuresis

## 2023-02-08 NOTE — Assessment & Plan Note (Signed)
Patient noted to have elevated creatinine on admission.  Creatinine 1.6 today, previously 1.40, 2 months ago. Patient w/ hx of CKD III. Will continue to monitor.  Creatinine likely elevated in setting of hypervolemia.  Plan to diurese and see how creatinine improves. - Continue diuresis - A.m. BMP

## 2023-02-08 NOTE — Hospital Course (Addendum)
Lauren Wood is a 62 y.o.female with a history of CHF, HTN, intellectual disability, CKD3, BPD, depression, bipolar disorder who was admitted to the [facility] Medicine Teaching Service at [facility] for SOB and hypoxia 2/2 acute on chronic CHF exacerbation. Her hospital course is detailed below:  Acute on chronic CHF exacerbation Presented initially with 3 to 4 weeks of increasing shortness of breath and hypoxia at home.  Typically on 2 L O2 as needed at home but was requiring it every day for the past 3 to 4 weeks.  Patient diuresed with IV Lasix 80 mg with improvement in great urinary output. Transitioned back to home dose 9/27. Echo obtained showing***.  Patient's shortness of breath improved significantly on discharge and O2 requirement near her baseline.  Elevated creatinine on admission Baseline around 1.3, on admission creatinine was 1.6.  Suspected to be due to hypervolemia.  Her creatinine improved throughout admission while being diuresed.   Other chronic conditions were medically managed with home medications and formulary alternatives as necessary (HTN)  PCP Follow-up Recommendations: Consider adding GLP1 CPAP compliance at home, required usage in the hospital

## 2023-02-08 NOTE — Progress Notes (Signed)
Daily Progress Note Intern Pager: [phone]  Patient name: Lauren Wood Medical record number: [id number] Date of birth: 1961/04/30 Age: 62 y.o. Gender: female  Primary Care Provider: Tiffany Kocher, DO Consultants: none Code Status: Full  Pt Overview and Major Events to Date:  9/26 - admitted  Assessment and Plan:  62 year old with past medical history CHF, hypertension, intellectual disability, CKD 3, BPD, depression, bipolar disorder who presented with SOB and hypoxia suspected to be 2/2 acute on chronic CHF exacerbation.  Assessment & Plan Acute on chronic congestive heart failure [facility]) Patient reportedly with 2 weeks of increasing shortness of breath, hypoxia, and increased leg swelling. BNP elevated to 701.3 on admission, with CXR showing enlarged cardiac silhouette. EF 30-35% on 11/17/22. Pt was most recently admitted for this in July 2024. - s/p 80mg  IV lasix in ED, redosed 80mg  IV this morning - plan for 80mg  IV lasix tonight - echo ordered - continue home metoprolol 50mg  BID, spironolactone 25mg  - strict I/Os - Daily Weights - Wean O2 as tolerated - replete electrolytes as needed - K goal >4, Mg goal >2 - AM BMP, mag Acute hypoxic respiratory failure (HCC) Patient comes in for 3-4 weeks of hypoxia noted at home.  Uses O2 PRN at home, will confirm with sister how much O2 she is on.  Will diurese patient, continue to monitor, and wean O2 as tolerated. - Wean O2 as tolerated - Continue diuresis Elevated serum creatinine Patient noted to have elevated creatinine on admission. Creatinine 1.6 on admission but improved to 1.4 this morning, appears her baseline is around 1.3. Patient w/ hx of CKD III. Creatinine likely elevated initially in setting of hypervolemia.   - Continue diuresis - A.m. BMP   Chronic and Stable Problems:  HTN - continue home meds intellectual disability  FEN/GI: heart healthy PPx: lovenox Dispo:pending clinical improvement   Subjective:   Patient was admitted last night.  No acute concerns right now.  She is alone in the room, states that she has been short of breath with laying flat and walking  Objective: Temp:  [98.3 F (36.8 C)-99.3 F (37.4 C)] 98.3 F (36.8 C) (09/26 1124) Pulse Rate:  [44-76] 60 (09/26 1124) Resp:  [16-37] 20 (09/26 1124) BP: (105-140)/(75-109) 113/75 (09/26 1124) SpO2:  [93 %-100 %] 96 % (09/26 1124) Weight:  [144 kg-148.6 kg] 148.6 kg (09/26 0342) Physical Exam: General: Laying in bed, no acute distress Cardiovascular: RRR, no murmurs Respiratory: No increased work of breathing, on 2 L O2 [place].  Clear breath sounds bilaterally.  Not coughing. Abdomen: Soft, nontender Extremities: 2+ pitting edema BLE  Laboratory: Most recent CBC Lab Results  Component Value Date   WBC 9.0 02/08/2023   HGB 10.7 (L) 02/08/2023   HCT 36.6 02/08/2023   MCV 91.5 02/08/2023   PLT 208 02/08/2023   Most recent BMP    Latest Ref Rng & Units 02/08/2023    3:02 AM  BMP  Glucose 70 - 99 mg/dL 86   BUN 8 - 23 mg/dL 21   Creatinine 8.54 - 1.00 mg/dL 6.27   Sodium 035 - 009 mmol/L 141   Potassium 3.5 - 5.1 mmol/L 3.4   Chloride 98 - 111 mmol/L 103   CO2 22 - 32 mmol/L 26   Calcium 8.9 - 10.3 mg/dL 8.4    TSH 3.818 Mag 1.8 BNP 701.3 on arrival  Imaging/Diagnostic Tests: CXR 02/07/23 IMPRESSION: Enlarged cardiomediastinal silhouette with suspected mild central congestion.  Blessings Inglett, DO 02/08/2023,  12:27 PM  PGY-1, [facility] Health Family Medicine FPTS Intern pager: [phone], text pages welcome Secure chat group [facility] Teaching Service

## 2023-02-08 NOTE — Assessment & Plan Note (Addendum)
Patient reportedly with 2 weeks of increasing shortness of breath, hypoxia, and increased leg swelling. BNP elevated to 701.3 on admission, with CXR showing enlarged cardiac silhouette. EF 30-35% on 11/17/22. Pt was most recently admitted for this in July 2024. - s/p 80mg  IV lasix in ED, redosed 80mg  IV this morning - plan for 80mg  IV lasix tonight - echo ordered - continue home metoprolol 50mg  BID, spironolactone 25mg  - strict I/Os - Daily Weights - Wean O2 as tolerated - replete electrolytes as needed - K goal >4, Mg goal >2 - AM BMP, mag

## 2023-02-09 ENCOUNTER — Other Ambulatory Visit (HOSPITAL_COMMUNITY): Payer: Medicare (Managed Care)

## 2023-02-09 DIAGNOSIS — I509 Heart failure, unspecified: Secondary | ICD-10-CM

## 2023-02-09 LAB — MAGNESIUM: Magnesium: 2 mg/dL (ref 1.7–2.4)

## 2023-02-09 LAB — BASIC METABOLIC PANEL
Anion gap: 9 (ref 5–15)
BUN: 18 mg/dL (ref 8–23)
CO2: 30 mmol/L (ref 22–32)
Calcium: 8.5 mg/dL — ABNORMAL LOW (ref 8.9–10.3)
Chloride: 103 mmol/L (ref 98–111)
Creatinine, Ser: 1.31 mg/dL — ABNORMAL HIGH (ref 0.44–1.00)
GFR, Estimated: 46 mL/min — ABNORMAL LOW (ref >60)
Glucose, Bld: 87 mg/dL (ref 70–99)
Potassium: 3.8 mmol/L (ref 3.5–5.1)
Sodium: 142 mmol/L (ref 135–145)

## 2023-02-09 MED ORDER — FUROSEMIDE 40 MG PO TABS
80.0000 mg | ORAL_TABLET | Freq: Once | ORAL | Status: AC
  Administered 2023-02-09: 80 mg via ORAL
  Filled 2023-02-09: qty 2

## 2023-02-09 MED ORDER — POTASSIUM CHLORIDE CRYS ER 20 MEQ PO TBCR
40.0000 meq | EXTENDED_RELEASE_TABLET | Freq: Once | ORAL | Status: AC
  Administered 2023-02-09: 40 meq via ORAL
  Filled 2023-02-09: qty 2

## 2023-02-09 MED ORDER — FUROSEMIDE 10 MG/ML IJ SOLN
80.0000 mg | Freq: Once | INTRAMUSCULAR | Status: AC
  Administered 2023-02-09: 80 mg via INTRAVENOUS
  Filled 2023-02-09: qty 8

## 2023-02-09 NOTE — Progress Notes (Addendum)
Daily Progress Note Intern Pager: [phone]  Patient name: Lauren Wood Medical record number: [id number] Date of birth: 21-Dec-1960 Age: 62 y.o. Gender: female  Primary Care Provider: Tiffany Kocher, DO Consultants: none Code Status: Full  Pt Overview and Major Events to Date:  9/26 - admitted  Assessment and Plan:  62 year old with past medical history CHF, hypertension, intellectual disability, CKD 3, BPD, depression, bipolar disorder who presented with SOB and hypoxia suspected to be 2/2 acute on chronic CHF exacerbation. Will plan to transition pt back to home dose PO 80mg  Lasix this evening.  Assessment & Plan Acute on chronic congestive heart failure [facility]) Patient reportedly with 3-4 weeks of increasing shortness of breath, hypoxia, and increased leg swelling. BNP elevated to 701.3 on admission, with CXR showing enlarged cardiac silhouette. EF 30-35% on 11/17/22. Pt was most recently admitted for this in July 2024. - s/p 3 doses lasix 80mg  - K 3.8 this morning-repleted, Mag 2.0 - echo ordered - continue home metoprolol 50mg  BID, spironolactone 25mg  - strict I/Os - yesterday but none documented last night - Daily Weights - 148.6kg>147.9kg - Wean O2 as tolerated - replete electrolytes as needed - K goal >4, Mg goal >2 - AM BMP, mag Acute hypoxic respiratory failure (HCC) Patient comes in for 3-4 weeks of hypoxia noted at home.  Uses 2L O2 PRN at home but has been requiring it daily for the last 3-4 weeks per her sister.  - Wean O2 as tolerated - Continue diuresis Elevated serum creatinine Patient noted to have elevated creatinine on admission at 1.6. Creatinine 1.31 this morning, appears to be closer to her baseline.  Patient w/ hx of CKD III. Creatinine likely elevated initially in setting of hypervolemia.   - Continue diuresis - A.m. BMP  Chronic and Stable Problems:  HTN - continue home meds intellectual disability   FEN/GI: heart healthy PPx:  lovenox Dispo: pending clinical improvement   Subjective:  No acute events overnight. Pt sleeping when I entered with CPAP mask next to the bed. When I woke her up she says she is still short of breath, no other history can really be obtained 2/2 intellectual disability and pt being woken from sleep. Was on 7L O2 with pulse ox not reading when I saw pt this morning, I readjusted her pulse ox and weaned her to 4.5L O2 without difficulty.   Objective: Temp:  [98.1 F (36.7 C)-98.3 F (36.8 C)] 98.1 F (36.7 C) (09/27 0020) Pulse Rate:  [60-74] 67 (09/27 0020) Resp:  [20] 20 (09/26 1900) BP: (108-136)/(56-87) 119/71 (09/26 1900) SpO2:  [94 %-96 %] 94 % (09/27 0010) Weight:  [147.9 kg] 147.9 kg (09/27 0500) Physical Exam: General: Resting in bed, no acute distress Cardiovascular: RRR, no murmurs Respiratory: CTAB, however exam limited secondary to body habitus.  No increased work of breathing on 4.5L O2 nasal cannula, satting around 94%.  She was on 7.5L when I entered the room and I weaned her down to 4.5 myself. Abdomen: Soft, nontender Extremities: BLE swelling appears better than yesterday but still with 1+ edema, chronic lipidema   Laboratory: Most recent CBC Lab Results  Component Value Date   WBC 9.0 02/08/2023   HGB 10.7 (L) 02/08/2023   HCT 36.6 02/08/2023   MCV 91.5 02/08/2023   PLT 208 02/08/2023   Most recent BMP    Latest Ref Rng & Units 02/09/2023    7:11 AM  BMP  Glucose 70 - 99 mg/dL 87  BUN 8 - 23 mg/dL 18   Creatinine 1.61 - 1.00 mg/dL 0.96   Sodium 045 - 409 mmol/L 142   Potassium 3.5 - 5.1 mmol/L 3.8   Chloride 98 - 111 mmol/L 103   CO2 22 - 32 mmol/L 30   Calcium 8.9 - 10.3 mg/dL 8.5     Imaging/Diagnostic Tests: None  Alainah Phang, DO 02/09/2023, 10:49 AM  PGY-1, [facility] FPTS Intern pager: [phone], text pages welcome Secure chat group [facility] Acute Care Teaching Service

## 2023-02-09 NOTE — TOC Initial Note (Signed)
Transition of Care [facility]) - Initial/Assessment Note    Patient Details  Name: Lauren Wood MRN: [id number] Date of Birth: 20-Jun-1960  Transition of Care [facility]) CM/SW Contact:    Leone Haven, RN Phone Number: 02/09/2023, 4:36 PM  Clinical Narrative:                 From home with sister, patient gave this NCM permission to speak with her sister Cira Rue, she states patient  has PCP and insurance on file, states has no HH services in place at this time , has  home oxygen with adapt 2 liters and a walker at home.  States she will transport her home at [employer] and she is support system, states gets medications from [facility].  Pta  ambulatory walker.  Patient goes to [facility] - [facility] day care from 9 am to 3 or 4 pm.   Expected Discharge Plan: Home/Self Care Barriers to Discharge: Continued Medical Work up   Patient Goals and CMS Choice Patient states their goals for this hospitalization and ongoing recovery are:: return home with sister   Choice offered to / list presented to : NA      Expected Discharge Plan and Services In-house Referral: NA Discharge Planning Services: CM Consult Post Acute Care Choice: NA Living arrangements for the past 2 months: Apartment                 DME Arranged: N/A DME Agency: NA       HH Arranged: NA          Prior Living Arrangements/Services Living arrangements for the past 2 months: Apartment Lives with:: Siblings (arlene) Patient language and need for interpreter reviewed:: Yes Do you feel safe going back to the place where you live?: Yes      Need for Family Participation in Patient Care: Yes (Comment) Care giver support system in place?: Yes (comment) Current home services: DME (walker  and home oxygen 2 liters) Criminal Activity/Legal Involvement Pertinent to Current Situation/Hospitalization: No - Comment as needed  Activities of Daily Living   ADL Screening (condition at time of admission) Does the  patient have a NEW difficulty with bathing/dressing/toileting/self-feeding that is expected to last >3 days?: No Does the patient have a NEW difficulty with getting in/out of bed, walking, or climbing stairs that is expected to last >3 days?: No Does the patient have a NEW difficulty with communication that is expected to last >3 days?: No Is the patient deaf or have difficulty hearing?: Yes Does the patient have difficulty seeing, even when wearing glasses/contacts?: No Does the patient have difficulty concentrating, remembering, or making decisions?: No  Permission Sought/Granted Permission sought to share information with : Case Manager Permission granted to share information with : Yes, Verbal Permission Granted  Share Information with NAME: Arlene     Permission granted to share info w Relationship: sister     Emotional Assessment Appearance:: Appears stated age Attitude/Demeanor/Rapport: Engaged Affect (typically observed): Appropriate Orientation: : Oriented to Self, Oriented to Place, Oriented to  Time, Oriented to Situation Alcohol / Substance Use: Not Applicable Psych Involvement: No (comment)  Admission diagnosis:  Elevated serum creatinine [R79.89] Acute on chronic congestive heart failure (HCC) [I50.9] Acute on chronic congestive heart failure, unspecified heart failure type (HCC) [I50.9] Acute hypoxic respiratory failure (HCC) [J96.01] Patient Active Problem List   Diagnosis Date Noted   Acute on chronic congestive heart failure (HCC) 02/08/2023   Elevated serum creatinine 02/08/2023  Nonrheumatic mitral valve regurgitation    Acute hypoxic respiratory failure (HCC) 03/16/2020   Pulmonary nodules 01/26/2019   Eustachian tube dysfunction, left 01/01/2019   Obesity hypoventilation syndrome (HCC)    Eustachian tube dysfunction, bilateral 12/07/2017   Diverticulosis of colon 11/22/2016   Myringotomy tube status 02/11/2016   Bilateral impacted cerumen 07/07/2015    Mixed conductive and sensorineural hearing loss of right ear with restricted hearing of left ear 07/07/2015   Urinary incontinence 12/24/2014   History of colonic polyps 06/24/2013   Health care maintenance 02/11/2013   Sensorineural hearing loss, bilateral 02/11/2013   OSA (obstructive sleep apnea) 12/11/2012   Obstructive sleep apnea syndrome 12/11/2012   Borderline personality disorder (HCC) 05/31/2011   Chronic kidney disease, stage III (moderate) (HCC) 02/06/2011   Congestive heart failure (HCC) 02/06/2011   BMI 50.0-59.9, adult (HCC) 08/02/2009   Benign essential hypertension 08/02/2009   Gastroesophageal reflux disease 08/02/2009   Intellectual disability 08/02/2009   Dyssomnia 08/02/2009   PCP:  Tiffany Kocher, DO Pharmacy:   [facility] - [place], [place] - [address] [place] [address] Phone: [phone] Fax: [phone]  [id number] [place], [place] - [address] AT [facility] [address] [place] [address] Phone: [phone] Fax: [phone]  [facility] [address] [place] [address] Phone: [phone] Fax: [phone]     Social Determinants of Health (SDOH) Social History: SDOH Screenings   Food Insecurity: Low Risk  (11/30/2022)   Received from [facility]  Housing: Low Risk  (11/21/2022)  Utilities: Low Risk  (11/30/2022)   Received from [facility]  Depression (PHQ2-9): Low Risk  (11/20/2022)  Tobacco Use: Low Risk  (02/07/2023)   SDOH Interventions:     Readmission Risk Interventions    11/17/2022    1:43 PM  Readmission Risk Prevention Plan  Post Dischage Appt Complete  Medication Screening Complete  Transportation Screening Complete

## 2023-02-09 NOTE — Assessment & Plan Note (Addendum)
Patient noted to have elevated creatinine on admission at 1.6. Creatinine 1.31 this morning, appears to be closer to her baseline.  Patient w/ hx of CKD III. Creatinine likely elevated initially in setting of hypervolemia.   - Continue diuresis - A.m. BMP

## 2023-02-09 NOTE — Assessment & Plan Note (Addendum)
Patient reportedly with 3-4 weeks of increasing shortness of breath, hypoxia, and increased leg swelling. BNP elevated to 701.3 on admission, with CXR showing enlarged cardiac silhouette. EF 30-35% on 11/17/22. Pt was most recently admitted for this in July 2024. - s/p 3 doses lasix 80mg  - K 3.8 this morning-repleted, Mag 2.0 - echo ordered - continue home metoprolol 50mg  BID, spironolactone 25mg  - strict I/Os - yesterday but none documented last night - Daily Weights - 148.6kg>147.9kg - Wean O2 as tolerated - replete electrolytes as needed - K goal >4, Mg goal >2 - AM BMP, mag

## 2023-02-09 NOTE — Discharge Instructions (Signed)
Dear Lauren Wood,   Thank you for letting us participate in your care!  You were admitted to the hospital due to exacerbation of your congestive heart failure, which made you short of breath.   POST-HOSPITAL & CARE INSTRUCTIONS We recommend following up with your PCP within 1 week from being discharged from the hospital. Please let PCP/Specialists know of any changes in medications that were made which you will be able to see in the medications section of this packet.  DOCTOR'S APPOINTMENTS & FOLLOW UP Future Appointments  Date Time Provider Department Center  02/09/2023  3:00 PM [facility] ECHO 4 [facility]  03/16/2023 10:45 AM Helane Gunther, DPM [facility]   Return Precautions: - Increased swelling in legs - Worsened shortness of breath or chest pain - Confusion  Thank you for choosing [facility]! Take care and be well!  Family Medicine Teaching Service Inpatient Team [place]  [facility]  [address] [place], [place] [address] [phone]

## 2023-02-09 NOTE — Assessment & Plan Note (Addendum)
Patient comes in for 3-4 weeks of hypoxia noted at home.  Uses 2L O2 PRN at home but has been requiring it daily for the last 3-4 weeks per her sister.  - Wean O2 as tolerated - Continue diuresis

## 2023-02-09 NOTE — Plan of Care (Signed)
  Problem: Clinical Measurements: Goal: Respiratory complications will improve Outcome: Progressing Goal: Cardiovascular complication will be avoided Outcome: Progressing   Problem: Activity: Goal: Risk for activity intolerance will decrease Outcome: Progressing   Problem: Nutrition: Goal: Adequate nutrition will be maintained Outcome: Progressing   

## 2023-02-09 NOTE — Progress Notes (Signed)
   02/09/23 0010  BiPAP/CPAP/SIPAP  $ Non-Invasive Home Ventilator  Initial  $ Face Mask Medium Yes  BiPAP/CPAP/SIPAP Pt Type Adult  BiPAP/CPAP/SIPAP Resmed  Mask Type Full face mask  Mask Size Medium  EPAP 10 cmH2O  Flow Rate 3 lpm  Patient Home Equipment No  Auto Titrate No  CPAP/SIPAP surface wiped down Yes  BiPAP/CPAP /SiPAP Vitals  SpO2 94 %  Bilateral Breath Sounds Diminished

## 2023-02-10 ENCOUNTER — Inpatient Hospital Stay (HOSPITAL_COMMUNITY): Payer: Medicare (Managed Care)

## 2023-02-10 DIAGNOSIS — I509 Heart failure, unspecified: Secondary | ICD-10-CM | POA: Diagnosis not present

## 2023-02-10 LAB — ECHOCARDIOGRAM COMPLETE
AR max vel: 1.86 cm2
AV Area VTI: 1.78 cm2
AV Area mean vel: 1.78 cm2
AV Mean grad: 5 mm[Hg]
AV Peak grad: 9.6 mm[Hg]
Ao pk vel: 1.55 m/s
Area-P 1/2: 3.63 cm2
MV M vel: 4.49 m/s
MV Peak grad: 80.6 mm[Hg]
Radius: 0.7 cm
S' Lateral: 5.5 cm
Weight: 5185.22 [oz_av]

## 2023-02-10 LAB — MAGNESIUM: Magnesium: 1.9 mg/dL (ref 1.7–2.4)

## 2023-02-10 LAB — BASIC METABOLIC PANEL
Anion gap: 9 (ref 5–15)
BUN: 20 mg/dL (ref 8–23)
CO2: 31 mmol/L (ref 22–32)
Calcium: 8.7 mg/dL — ABNORMAL LOW (ref 8.9–10.3)
Chloride: 100 mmol/L (ref 98–111)
Creatinine, Ser: 1.4 mg/dL — ABNORMAL HIGH (ref 0.44–1.00)
GFR, Estimated: 43 mL/min — ABNORMAL LOW (ref >60)
Glucose, Bld: 81 mg/dL (ref 70–99)
Potassium: 4 mmol/L (ref 3.5–5.1)
Sodium: 140 mmol/L (ref 135–145)

## 2023-02-10 MED ORDER — PERFLUTREN LIPID MICROSPHERE
1.0000 mL | INTRAVENOUS | Status: AC | PRN
  Administered 2023-02-10: 3 mL via INTRAVENOUS

## 2023-02-10 MED ORDER — MAGNESIUM SULFATE 2 GM/50ML IV SOLN
2.0000 g | Freq: Once | INTRAVENOUS | Status: AC
  Administered 2023-02-10: 2 g via INTRAVENOUS
  Filled 2023-02-10: qty 50

## 2023-02-10 NOTE — Plan of Care (Signed)

## 2023-02-10 NOTE — Progress Notes (Signed)
Daily Progress Note Intern Pager: [phone]  Patient name: Lauren Wood Medical record number: [id number] Date of birth: 01-05-1961 Age: 62 y.o. Gender: female  Primary Care Provider: Tiffany Kocher, DO Consultants: None Code Status: Full  Pt Overview and Major Events to Date:  9/26: Admitted to FMTS  Assessment and Plan:  62 year old with past medical history CHF, HTN, intellectual disability, CKDIII who presented with SOB and hypoxia suspected to be 2/2 acute on chronic CHF exacerbation. Symptomatically improving, now transitioned back to home home PO Lasix 80. Assessment & Plan Acute on chronic congestive heart failure [facility]) Patient reportedly with 3-4 weeks of increasing shortness of breath, hypoxia, and increased leg swelling. BNP elevated to 701.3 on admission, with CXR showing enlarged cardiac silhouette. EF 30-35% on 11/17/22. Pt was most recently admitted for this in July 2024. Now s/p 5 doses Lasix 80mg  with transition to home PO lasix 80mg  yesterday evening.  Medically stable at this time. - Cont home metoprolol 50mg  BID, spironolactone 25mg , Lasix 80 mg - This morning, K 4.0, Mag 1.9 (repleted)  - Goal K >4, Mg >2.   - Echo ordered - Strict I/Os: 1.25 L UOP in last 24h - Daily Weights: 148.6kg >147.9kg > 147 kg - Wean O2 as tolerated, now on 3L  - AM BMP, Mg Acute hypoxic respiratory failure (HCC) Patient comes in for 3-4 weeks of hypoxia noted at home.  Uses 2L O2 PRN at home but has been requiring it daily for the last 3-4 weeks per her sister.  On 3 L this morning with sats in the low 90s.  Medically stable at this time. - Wean O2 as tolerated - Continue home Lasix 80 mg Elevated serum creatinine Patient with elevated creatinine on admission at 1.6. Creatinine stable at 1.4 this morning, appears to be around her baseline.  Patient w/ hx of CKD III. Creatinine likely elevated initially in setting of hypervolemia.   - Continue diuresis - AM BMP  Chronic and  Stable Issues: HTN: Continue home Lopressor 50 BID, spironolactone 25 daily HLD: Cont home colestipol 1 BID Bipolar disorder, BPD: Continue home Risperdal 3 daily, fluoxetine 40 daily, bupropion 150 daily, mirtazapine 15 nightly, trazodone 50 nightly, clonazepam 0.5 BID PRN Gout: Cont home allopurinol 100  FEN/GI: Healthy diet PPx: Lovenox Dispo: Home pending clinical stability   Subjective:  Patient says she is doing okay this morning.  No chest pain or new difficulty breathing.  No headache or vision changes.  Per patient's sister, patient was regularly taking her Lasix 80 daily prior to admission.  Objective: Temp:  [97.9 F (36.6 C)-98.8 F (37.1 C)] 98.3 F (36.8 C) (09/28 0831) Pulse Rate:  [65-72] 72 (09/28 0831) Resp:  [0-29] 0 (09/28 0831) BP: (96-150)/(56-88) 150/83 (09/28 0831) SpO2:  [91 %-100 %] 92 % (09/28 0831) Weight:  [147 kg] 147 kg (09/28 0500) Physical Exam: General: Sleeping, conversant when awoken.  No acute distress. Cardiovascular: Distant S1/S2. No extra heart sounds.  Warm and well-perfused. Respiratory: On 3 L [place]. Diminished breath sounds bilaterally, limited aeration throughout.  No crackles or wheezes appreciated, exam limited by body habitus. Some abdominal breathing noted. Abdomen: Soft, nondistended. Extremities: 1+ pitting edema bilaterally  Laboratory: Most recent CBC Lab Results  Component Value Date   WBC 9.0 02/08/2023   HGB 10.7 (L) 02/08/2023   HCT 36.6 02/08/2023   MCV 91.5 02/08/2023   PLT 208 02/08/2023   Most recent BMP    Latest Ref Rng & Units  02/10/2023    2:53 AM  BMP  Glucose 70 - 99 mg/dL 81   BUN 8 - 23 mg/dL 20   Creatinine 4.09 - 1.00 mg/dL 8.11   Sodium 914 - 782 mmol/L 140   Potassium 3.5 - 5.1 mmol/L 4.0   Chloride 98 - 111 mmol/L 100   CO2 22 - 32 mmol/L 31   Calcium 8.9 - 10.3 mg/dL 8.7    Ivery Quale, MD 02/10/2023, 10:05 AM  PGY-1, [place] [facility] FPTS Intern pager: [phone], text pages  welcome Secure chat group [facility] Teaching Service

## 2023-02-10 NOTE — Discharge Summary (Cosign Needed Addendum)
**Note Lauren-Identified via Obfuscation** Family Medicine Teaching [facility] Discharge Summary  Patient name: Lauren Wood Medical record number: [id number] Date of birth: Apr 17, 1961 Age: 62 y.o. Gender: female Date of Admission: 02/07/2023  Date of Discharge: 02/10/23 Admitting Physician: Margaretmary Dys, MD  Primary Care Provider: Tiffany Kocher, DO Consultants: None  Indication for Hospitalization: Dyspnea and hypoxia  Discharge Diagnoses/Problem List:  Principal Problem for Admission: Acute decompensated CHF Other Problems addressed during stay:  Principal Problem:   Acute on chronic congestive heart failure (HCC) Active Problems:   Obesity hypoventilation syndrome (HCC)   Acute hypoxic respiratory failure (HCC)   Elevated serum creatinine  Brief Hospital Course:  Lauren Wood is a 62 y.o.female with a history of CHF, HTN, intellectual disability, CKD3, BPD, depression, bipolar disorder who was admitted to the [facility] Medicine Teaching Service at [facility] for SOB and hypoxia 2/2 acute on chronic CHF exacerbation. Her hospital course is detailed below:  Acute on chronic CHF exacerbation Presented initially with 3 to 4 weeks of increasing shortness of breath and hypoxia at home.  Typically on 2 L O2 as needed at home but was requiring it every day for the past 3 to 4 weeks.  Patient diuresed with IV Lasix 80 mg with improvement in great urinary output. Transitioned back to home dose 9/27. Echo obtained showing EF 30-35%, G2DD, moderate L atrial dilation, (stable compared to 11/17/2022 echo).  Patient's shortness of breath improved significantly on discharge and O2 requirement near her baseline. Pt was advised to continue lasix 80mg  PO BID for 3 days, then resume daily dosing thereafter.   AKI Elevated creatinine on admission Baseline around 1.3, on admission creatinine was 1.6.  Suspected to be due to hypervolemia.  Her creatinine improved throughout admission while being diuresed.  Other chronic  conditions were medically managed with home medications and formulary alternatives as necessary (HTN)  PCP Follow-up Recommendations: Pt advised to continue Lasix BID for 3 days, then resume daily dosing. Ensure pt is taking Lasix as directed.  Consider adding GLP1 for CHF and obesity Ensure CPAP compliance at home, required usage in the hospital 3.   TSH (4.827). Repeat TSH outpt.   Disposition: Home  Discharge Condition: Improved and stable  Discharge Exam:  Vitals:   02/10/23 0831 02/10/23 1300  BP: (!) 150/83   Pulse: 72   Resp: (!) 0   Temp: 98.3 F (36.8 C)   SpO2: 92% 92%   General: Sleeping, conversant when awoken.  No acute distress. Cardiovascular: Distant S1/S2. No extra heart sounds.  Warm and well-perfused. Respiratory: On 3 L [place]. Diminished breath sounds bilaterally, limited aeration throughout.  No crackles or wheezes appreciated, exam limited by body habitus. Some abdominal breathing noted. Abdomen: Soft, nondistended. Extremities: 1+ pitting edema bilaterally  Significant Procedures: none  Significant Labs and Imaging:   Recent Labs  Lab 02/09/23 0711 02/10/23 0253  NA 142 140  K 3.8 4.0  CL 103 100  CO2 30 31  GLUCOSE 87 81  BUN 18 20  CREATININE 1.31* 1.40*  CALCIUM 8.5* 8.7*  MG 2.0 1.9   Lab Results  Component Value Date   WBC 9.0 02/08/2023   HGB 10.7 (L) 02/08/2023   HCT 36.6 02/08/2023   MCV 91.5 02/08/2023   PLT 208 02/08/2023   Echocardiogram (9/28): EF 30-35%, G2DD, moderate L atrial dilation, (stable compared to 11/17/2022 echo)  Results/Tests Pending at Time of Discharge: none  Discharge Medications:  Allergies as of 02/10/2023  Reactions   Enalapril Maleate Other (See Comments)   Unknown reaction - per family  Vasotec    Hctz [hydrochlorothiazide] Other (See Comments)   Hyperuricemia        Medication List     TAKE these medications    acetaminophen 500 MG tablet Commonly known as: TYLENOL Take 1 tablet (500  mg total) by mouth every 6 (six) hours as needed for headache.   allopurinol 100 MG tablet Commonly known as: ZYLOPRIM Take 1 tablet (100 mg total) by mouth daily.   benztropine 0.5 MG tablet Commonly known as: COGENTIN Take 0.5 mg by mouth 2 (two) times daily.   buPROPion 150 MG 24 hr tablet Commonly known as: WELLBUTRIN XL Take 150 mg by mouth every morning.   clonazePAM 0.5 MG tablet Commonly known as: KLONOPIN Take 0.5 tablets (0.25 mg total) by mouth daily as needed for anxiety. What changed: when to take this   colestipol 1 g tablet Commonly known as: COLESTID Take 1 tablet (1 g total) by mouth 2 (two) times daily.   FLUoxetine 40 MG capsule Commonly known as: PROZAC Take 1 capsule (40 mg total) by mouth every morning.   furosemide 80 MG tablet Commonly known as: LASIX Take 1 tablet (80 mg total) by mouth daily.   metoprolol tartrate 50 MG tablet Commonly known as: LOPRESSOR Take 1 tablet (50 mg total) by mouth 2 (two) times daily.   mirtazapine 15 MG tablet Commonly known as: REMERON Take 1 tablet (15 mg total) by mouth at bedtime.   prazosin 1 MG capsule Commonly known as: MINIPRESS Take 1 mg by mouth at bedtime.   risperiDONE 3 MG tablet Commonly known as: RISPERDAL Take 3 mg by mouth 3 (three) times daily.   spironolactone 25 MG tablet Commonly known as: ALDACTONE Take 1 tablet (25 mg total) by mouth daily.   traZODone 50 MG tablet Commonly known as: DESYREL Take 1 tablet (50 mg total) by mouth at bedtime.        Discharge Instructions: Please refer to Patient Instructions section of EMR for full details.  Patient was counseled important signs and symptoms that should prompt return to medical care, changes in medications, dietary instructions, activity restrictions, and follow up appointments.   Follow-Up Appointments: Follow-up with PCP 02/15/23  Ivery Quale, MD 02/10/2023, 8:12 PM PGY-1, [facility]

## 2023-02-10 NOTE — Plan of Care (Signed)
Pt educated and adhere to safety precautions

## 2023-02-10 NOTE — Assessment & Plan Note (Addendum)
Patient with elevated creatinine on admission at 1.6. Creatinine stable at 1.4 this morning, appears to be around her baseline.  Patient w/ hx of CKD III. Creatinine likely elevated initially in setting of hypervolemia.   - Continue diuresis - AM BMP

## 2023-02-10 NOTE — Assessment & Plan Note (Addendum)
Patient reportedly with 3-4 weeks of increasing shortness of breath, hypoxia, and increased leg swelling. BNP elevated to 701.3 on admission, with CXR showing enlarged cardiac silhouette. EF 30-35% on 11/17/22. Pt was most recently admitted for this in July 2024. Now s/p 5 doses Lasix 80mg  with transition to home PO lasix 80mg  yesterday evening.  Medically stable at this time. - Cont home metoprolol 50mg  BID, spironolactone 25mg , Lasix 80 mg - This morning, K 4.0, Mag 1.9 (repleted)  - Goal K >4, Mg >2.   - Echo ordered - Strict I/Os: 1.25 L UOP in last 24h - Daily Weights: 148.6kg >147.9kg > 147 kg - Wean O2 as tolerated, now on 3L  - AM BMP, Mg

## 2023-02-10 NOTE — Progress Notes (Signed)
   02/09/23 2330  BiPAP/CPAP/SIPAP  $ Non-Invasive Home Ventilator  Subsequent  BiPAP/CPAP/SIPAP Pt Type Adult  BiPAP/CPAP/SIPAP Resmed  Mask Type Full face mask  IPAP 15 cmH20  EPAP 10 cmH2O  Flow Rate 3 lpm  BiPAP/CPAP /SiPAP Vitals  Pulse Rate 67  Resp (!) 22

## 2023-02-10 NOTE — Assessment & Plan Note (Addendum)
Patient comes in for 3-4 weeks of hypoxia noted at home.  Uses 2L O2 PRN at home but has been requiring it daily for the last 3-4 weeks per her sister.  On 3 L this morning with sats in the low 90s.  Medically stable at this time. - Wean O2 as tolerated - Continue home Lasix 80 mg

## 2023-02-12 ENCOUNTER — Telehealth: Payer: Self-pay

## 2023-02-12 NOTE — Transitions of Care (Post Inpatient/ED Visit) (Signed)
02/12/2023  Name: Lauren Wood MRN: [id number] DOB: 05-10-1961  Today's TOC FU Call Status: Today's TOC FU Call Status:: Successful TOC FU Call Completed TOC FU Call Complete Date: 02/12/23 Patient's Name and Date of Birth confirmed.  Transition Care Management Follow-up Telephone Call Date of Discharge: 02/10/23 Discharge Facility: [facility]) Type of Discharge: Inpatient Admission Primary Inpatient Discharge Diagnosis:: Acute Decompensated Congestive Heart Failure How have you been since you were released from the hospital?: Better (Per patients sister) Any questions or concerns?: Yes Patient Questions/Concerns:: Patien's sister noted patient had a episode of her O2 sats dropping to 79%.  It went back to 94% and has been there since. Patient Questions/Concerns Addressed: Other: (Discussed with patients sister the need to get a daily weight and trying not to miss any medication.  Patient only received one dose of Lasix yesterday ad the pharmacy was closed and she was out of the medication.  Patient back to twice per day.)  Items Reviewed: Did you receive and understand the discharge instructions provided?: Yes Any new allergies since your discharge?: No Dietary orders reviewed?: Yes Type of Diet Ordered:: Low sodium, carbohydrate controlled Do you have support at home?: Yes People in Home: sibling(s) Name of Support/Comfort Primary Source: [name]  Medications Reviewed Today: Medications Reviewed Today     Reviewed by Jodelle Gross, RN (Case Manager) on 02/12/23 at 1058  Med List Status: <None>   Medication Order Taking? Sig Documenting Provider Last Dose Status Informant  acetaminophen (TYLENOL) 500 MG tablet [id number] Yes Take 1 tablet (500 mg total) by mouth every 6 (six) hours as needed for headache. Caro Laroche, DO Taking Active Family Member  allopurinol (ZYLOPRIM) 100 MG tablet [id number] Yes Take 1 tablet (100 mg total) by mouth daily. Nestor Ramp, MD  Taking Active Family Member  benztropine (COGENTIN) 0.5 MG tablet [id number] Yes Take 0.5 mg by mouth 2 (two) times daily. [provider] Taking Active Family Member  buPROPion (WELLBUTRIN XL) 150 MG 24 hr tablet [id number] Yes Take 150 mg by mouth every morning. [provider] Taking Active Family Member           Med Note (S[place], Genoveva Ill   Tue Mar 16, 2020  5:55 PM)    clonazePAM (KLONOPIN) 0.5 MG tablet [id number] Yes Take 0.5 tablets (0.25 mg total) by mouth daily as needed for anxiety.  Patient taking differently: Take 0.25 mg by mouth 2 (two) times daily.   Cathleen Corti, MD Taking Active Family Member  colestipol (COLESTID) 1 g tablet [id number] Yes Take 1 tablet (1 g total) by mouth 2 (two) times daily. Nestor Ramp, MD Taking Active Family Member  FLUoxetine [facility]) 40 MG capsule [id number] Yes Take 1 capsule (40 mg total) by mouth every morning. Caro Laroche, DO Taking Active Family Member  furosemide (LASIX) 80 MG tablet [id number] Yes Take 1 tablet (80 mg total) by mouth daily. Tiffany Kocher, DO Taking Active Family Member           Med Note Electa Sniff, [facility]   Mon Feb 12, 2023 10:58 AM) Pt was advised to continue lasix 80mg  PO BID for 3 days, then resume daily dosing thereafter per dc summary.  metoprolol tartrate (LOPRESSOR) 50 MG tablet [id number] Yes Take 1 tablet (50 mg total) by mouth 2 (two) times daily. Caro Laroche, DO Taking Active Family Member  mirtazapine (REMERON) 15 MG tablet [id number] Yes Take 1 tablet (15 mg total) by mouth at bedtime. Rumball,  Darl Householder, DO Taking Active Family Member  prazosin (MINIPRESS) 1 MG capsule [id number] Yes Take 1 mg by mouth at bedtime. [provider] Taking Active Family Member  risperiDONE (RISPERDAL) 3 MG tablet [id number] Yes Take 3 mg by mouth 3 (three) times daily. [provider] Taking Active Family Member  spironolactone (ALDACTONE) 25 MG tablet [id number] Yes Take 1 tablet (25  mg total) by mouth daily. Caro Laroche, DO Taking Active Family Member  traZODone (DESYREL) 50 MG tablet [id number] Yes Take 1 tablet (50 mg total) by mouth at bedtime. Caro Laroche, DO Taking Active Family Member            Home Care and Equipment/Supplies: Were Home Health Services Ordered?: No Any new equipment or medical supplies ordered?: No  Functional Questionnaire: Do you need assistance with bathing/showering or dressing?: Yes Do you need assistance with meal preparation?: Yes Do you need assistance with eating?: No Do you have difficulty maintaining continence: No Do you need assistance with getting out of bed/getting out of a chair/moving?: No Do you have difficulty managing or taking your medications?: Yes  Follow up appointments reviewed: PCP Follow-up appointment confirmed?: Yes Date of PCP follow-up appointment?: 02/15/23 Follow-up Provider: Dr. Fatima Blank Specialist [facility] Follow-up appointment confirmed?: Yes Date of Specialist follow-up appointment?: 03/16/23 Follow-Up Specialty Provider:: Dr. Stacie Acres Do you need transportation to your follow-up appointment?: No Do you understand care options if your condition(s) worsen?: Yes-patient verbalized understanding  SDOH Interventions Today    Flowsheet Row Most Recent Value  SDOH Interventions   Food Insecurity Interventions Intervention Not Indicated  Housing Interventions Intervention Not Indicated      Interventions Today    Flowsheet Row Most Recent Value  Chronic Disease   Chronic disease during today's visit Congestive Heart Failure (CHF), Diabetes  General Interventions   General Interventions Discussed/Reviewed General Interventions Discussed, General Interventions Reviewed, Referral to Nurse         Goals Addressed             This Visit's Progress    TOC Care Plan       Current Barriers:  Knowledge Deficits related to plan of care for management of CHF   RNCM Clinical Goal(s):   Patient will work with the Care Management team over the next 30 days to address Transition of Care Barriers: Medication access Medication Management Diet/Nutrition/Food Resources Provider appointments through collaboration with RN Care manager, provider, and care team.   Interventions: Evaluation of current treatment plan related to  self management and patient's adherence to plan as established by provider  Transitions of Care:  New goal. Doctor Visits  - discussed the importance of doctor visits Referral to Longitudinal Nurse Case Manager for Ongoing follow-up  Patient Goals/Self-Care Activities: Participate in Transition of Care Program/Attend [facility] scheduled calls Notify RN Care Manager of TOC call rescheduling needs Take all medications as prescribed Attend all scheduled provider appointments Call pharmacy for medication refills 3-7 days in advance of running out of medications call office if I gain more than 2 pounds in one day or 5 pounds in one week track weight in diary use salt in moderation watch for swelling in feet, ankles and legs every day weigh myself daily begin a heart failure diary  Follow Up Plan:  Telephone follow up appointment with care management team member scheduled for:  02/21/23@10 :00.         Jodelle Gross RN, BSN, CCM RN Care Manager  Transitions of Care  [facility] -  Population Health  [phone]

## 2023-02-14 NOTE — Progress Notes (Deleted)
    SUBJECTIVE:   CHIEF COMPLAINT / HPI: hospital follow-up  Patient was recently admitted to the hospital for acute CHF exacerbation and AKI after 2-3 weeks of SOB. She was adequately diuresed and oxygen requirements returned to baseline. Cr returned to baseline as her fluid overload improved with diuresis.   PCP Follow-up Recommendations: Pt advised to continue Lasix BID for 3 days, then resume daily dosing. Ensure pt is taking Lasix as directed.  Consider adding GLP1 for CHF and obesity Ensure CPAP compliance at home, required usage in the hospital 3.   TSH (4.827). Repeat TSH outpt.   PERTINENT  PMH / PSH: CHF, HTN, CKD3, BPD, depression  OBJECTIVE:   LMP 05/08/2013   General: Awake and Alert in NAD HEENT: Normocephalic, atraumatic. Conjunctiva normal. No nasal discharge Cardiovascular: RRR. No M/R/G Respiratory: CTAB, normal WOB on RA. No wheezing, crackles, rhonchi, or diminished breath sounds. Abdomen: Soft, non-tender, non-distended. Bowel sounds normoactive/hypoactive/hyperactive. *** Extremities: No BLE edema, no deformities or significant joint findings. Skin: Warm and dry.  ASSESSMENT/PLAN:   No problem-specific Assessment & Plan notes found for this encounter.     [name], DO [facility]

## 2023-02-15 ENCOUNTER — Ambulatory Visit: Payer: Self-pay | Admitting: Family Medicine

## 2023-02-20 NOTE — Progress Notes (Signed)
    SUBJECTIVE:   CHIEF COMPLAINT / HPI: hospital follow-up  Patient was recently admitted to the hospital for acute CHF exacerbation and AKI after 2-3 weeks of SOB. She was adequately diuresed and oxygen requirements returned to baseline. Cr returned to baseline as her fluid overload improved with diuresis.   Sister accompanies patient and reports compliance with taking her Lasix BID for 3 days and now has returned to her normal dose. Her breathing has improved, but is labored with activities. She has been using 2L O2 when her O2 levels are low which seems to be daily into the 70s-90s, but currently saturating well on RA. Continues to use her CPAP regularly, but sister reports that she is still sleeping a lot during the day. Cardiology said she should repeat the sleep study and previous PCP recommended a new CPAP which was ordered. However, they still don't have the new CPAP.  Has previously been on Ozempic before and sister reports that it was really helping her condition and improving her physical activity. But then her medicaid stopped paying for it, and it was becoming unaffordable. She would like to try it again at this time to see if it helps her overall condition.  PCP Follow-up Recommendations: Pt advised to continue Lasix BID for 3 days, then resume daily dosing. Ensure pt is taking Lasix as directed.  Consider adding GLP1 for CHF and obesity Ensure CPAP compliance at home, required usage in the hospital TSH (4.827). Repeat TSH outpt.   HTN BP was elevated upon coming to the office today to 144/119 and improved upon recheck to 103/84. Denies headaches, blurry vision, or dizziness at this time.  PERTINENT  PMH / PSH: CHF, HTN, CKD3, BPD, depression  OBJECTIVE:   BP 103/84   Pulse (!) 58   Ht 5\' 7"  (1.702 m)   Wt (!) 308 lb 6 oz (139.9 kg)   LMP 05/08/2013   SpO2 98%   BMI 48.30 kg/m   General: Awake and Alert in NAD HEENT: Normocephalic, atraumatic. Conjunctiva normal. No  nasal discharge Cardiovascular: Distant heart sounds. Respiratory: CTAB, normal WOB on RA. No wheezing, crackles, rhonchi, or diminished breath sounds. Abdomen: Soft, non-tender, non-distended. Bowel sounds normoactive Extremities: No BLE edema, no deformities or significant joint findings. Skin: Warm and dry.  ASSESSMENT/PLAN:   Congestive heart failure [facility]) Patient goal for hospital follow-up today after CHF exacerbation.  Completed her Lasix BID for 3 days and resume daily dosing.  Has been using her CPAP at home.  [facility] recommendations for follow-up discussed GLP-1 for CHF and obesity with patient and she would like to try to get prior authorization.  Patient denies any family or personal history of medullary thyroid cancer, MEN 2, or pancreatitis. She has been on this previously and it seems to have helped her condition/overall wellbeing. - Advised patient to continue using her Lasix and CPAP as prescribed - Re-prescribed CPAP to see if they could get a new machine - Scheduled lab appt on 10/30 to recheck TSH w/ reflex to T4 and PCP appt on 11/11 to review and follow up on CPAP/Ozempic/CHF  Patient also received her flu shot today.   [name], DO [place] [facility]

## 2023-02-21 ENCOUNTER — Ambulatory Visit (INDEPENDENT_AMBULATORY_CARE_PROVIDER_SITE_OTHER): Payer: 59 | Admitting: Family Medicine

## 2023-02-21 ENCOUNTER — Telehealth: Payer: Self-pay

## 2023-02-21 ENCOUNTER — Encounter: Payer: Self-pay | Admitting: Family Medicine

## 2023-02-21 VITALS — BP 103/84 | HR 58 | Ht 67.0 in | Wt 308.4 lb

## 2023-02-21 DIAGNOSIS — I5022 Chronic systolic (congestive) heart failure: Secondary | ICD-10-CM | POA: Diagnosis not present

## 2023-02-21 DIAGNOSIS — Z23 Encounter for immunization: Secondary | ICD-10-CM | POA: Diagnosis not present

## 2023-02-21 DIAGNOSIS — R7989 Other specified abnormal findings of blood chemistry: Secondary | ICD-10-CM | POA: Diagnosis not present

## 2023-02-21 DIAGNOSIS — G4733 Obstructive sleep apnea (adult) (pediatric): Secondary | ICD-10-CM

## 2023-02-21 MED ORDER — SEMAGLUTIDE(0.25 OR 0.5MG/DOS) 2 MG/1.5ML ~~LOC~~ SOPN
0.2500 mg | PEN_INJECTOR | SUBCUTANEOUS | 3 refills | Status: DC
Start: 2023-02-21 — End: 2023-03-15

## 2023-02-21 NOTE — Patient Outreach (Signed)
Care Management  Transitions of Care Program Transitions of Care Post-discharge week 2  02/21/2023 Name: Lauren Wood MRN: [id number] DOB: 1961/05/14  Subjective: Lauren Wood is a 62 y.o. year old female who is a primary care patient of Tiffany Kocher, DO. The Care Management team  was unable to reach guardian by phone  to assess and address transitions of care needs.   Plan: Additional outreach attempts will be made to reach the patient enrolled in the [facility] Program (Post Inpatient/ED Visit).  Jodelle Gross RN, BSN, CCM RN Care Manager  Transitions of Care  [facility] - [facility]  [phone]

## 2023-02-21 NOTE — Patient Instructions (Addendum)
It was great to see you today! Thank you for choosing [facility] for your primary care. Lauren Wood was seen for hospital follow up.  Today we addressed: CHF - continue your Lasix and CPAP as prescribed.  Will try to prescribe you Ozempic to help your CHF and to aid in weight loss, this will likely require prior authorization even though you have used it before. Follow up on 10/30 at 8:30 AM for lab appointment. Follow up with your PCP Dr. Claudean Severance on 11/11 at 1:50 PM to determine if you need additional treatment. Ordered CPAP machine again - can follow up with Dr. Claudean Severance if this is still causing issues.  If you haven't already, sign up for My Chart to have easy access to your labs results, and communication with your primary care physician.  Call the clinic at [phone] if your symptoms worsen or you have any concerns.  You should return to our clinic Return in about 3 weeks (around 03/14/2023) for Lab visit. Please arrive 15 minutes before your appointment to ensure smooth check in process.  We appreciate your efforts in making this happen.  Thank you for allowing me to participate in your care, [name], DO 02/21/2023, 11:19 AM PGY-1, [facility]

## 2023-02-21 NOTE — Assessment & Plan Note (Addendum)
Patient goal for hospital follow-up today after CHF exacerbation.  Completed her Lasix BID for 3 days and resume daily dosing.  Has been using her CPAP at home.  [facility] recommendations for follow-up discussed GLP-1 for CHF and obesity with patient and she would like to try to get prior authorization.  Patient denies any family or personal history of medullary thyroid cancer, MEN 2, or pancreatitis. She has been on this previously and it seems to have helped her condition/overall wellbeing. - Advised patient to continue using her Lasix and CPAP as prescribed - Re-prescribed CPAP to see if they could get a new machine - Scheduled lab appt on 10/30 to recheck TSH w/ reflex to T4 and PCP appt on 11/11 to review and follow up on CPAP/Ozempic/CHF

## 2023-02-22 ENCOUNTER — Telehealth: Payer: Self-pay

## 2023-02-22 NOTE — Telephone Encounter (Signed)
Patient's sister calls nurse line regarding yesterday's office visit.   She has concerns for patient as she has not been sleeping well. She also reports fluctuating oxygen levels while sleeping and is asking about CPAP machine.   Community message sent to [employer] for CPAP machine.   Sister is requesting to speak with PCP regarding recommendations for sleep.   Veronda Prude, RN

## 2023-02-26 ENCOUNTER — Telehealth: Payer: Self-pay

## 2023-02-26 NOTE — Telephone Encounter (Signed)
Patient scheduled for follow up by front office staff.   Veronda Prude, RN

## 2023-02-26 NOTE — Telephone Encounter (Signed)
Patients sister calls nurse line reporting skin changes.  She reports she was helping her into the shower yesterday and noticed a "bruised like" area on her lower abdomen.   She reports Teal denied falling or hurting herself in some way. She denies any pain or swelling. The area is not tender to touch. No fevers, nausea or vomiting.   Patient scheduled for 10/17 with PCP.   Precautions discussed in the meantime.

## 2023-03-01 ENCOUNTER — Telehealth: Payer: Self-pay

## 2023-03-01 ENCOUNTER — Ambulatory Visit: Payer: 59 | Admitting: Student

## 2023-03-01 NOTE — Telephone Encounter (Signed)
Pharmacy Patient Advocate Encounter   Received notification from CoverMyMeds that prior authorization for [facility] [employer] is required/requested.  PA required; PA submitted to [facility] via CoverMyMeds Key/confirmation #/EOC [id number]. Status is pending

## 2023-03-02 ENCOUNTER — Ambulatory Visit: Payer: Self-pay

## 2023-03-02 NOTE — Patient Outreach (Signed)
Care Management  Transitions of Care Program Transitions of Care Post-discharge week 3  03/02/2023 Name: [name] MRN: [id number] DOB: 11-01-1960  Subjective: Lauren Wood is a 62 y.o. year old female who is a primary care patient of Tiffany Kocher, DO. The Care Management team was unable to reach the patient by phone to assess and address transitions of care needs. Message left  Plan: Additional outreach attempts will be made to reach the patient enrolled in the [facility] Program (Post Inpatient/ED Visit).  Jodelle Gross RN, BSN, CCM RN Care Manager  Transitions of Care  [facility] - [facility]  [phone]

## 2023-03-05 NOTE — Telephone Encounter (Signed)
Pharmacy Patient Advocate Encounter  Received notification from [facility] that Prior Authorization for [facility] has been DENIED.  Full denial letter will be uploaded to the media tab. See denial reason below.

## 2023-03-06 ENCOUNTER — Telehealth: Payer: Self-pay

## 2023-03-06 ENCOUNTER — Ambulatory Visit: Payer: 59 | Admitting: Student

## 2023-03-06 NOTE — Telephone Encounter (Signed)
Patients sister calls nurse line to inform PCP of patients death.   She reports she passed away on the morning of 03-19-23.  Advised will forward to PCP.

## 2023-03-13 ENCOUNTER — Telehealth: Payer: Self-pay | Admitting: Cardiology

## 2023-03-13 NOTE — Telephone Encounter (Signed)
Sister Jacqualyn Posey) called to report patient passed away on Apr 03, 2023.

## 2023-03-14 ENCOUNTER — Other Ambulatory Visit: Payer: Self-pay

## 2023-03-16 ENCOUNTER — Ambulatory Visit: Payer: 59 | Admitting: Podiatry

## 2023-03-16 DEATH — deceased

## 2023-03-26 ENCOUNTER — Ambulatory Visit: Payer: Self-pay | Admitting: Student

## 2023-08-08 NOTE — Telephone Encounter (Signed)
error
